# Patient Record
Sex: Female | Born: 1945
Health system: Southern US, Community
[De-identification: ages and names within clinical notes are randomized; demographics above are authoritative.]

## PROBLEM LIST (undated history)

## (undated) DIAGNOSIS — K222 Esophageal obstruction: Secondary | ICD-10-CM

## (undated) DIAGNOSIS — M8580 Other specified disorders of bone density and structure, unspecified site: Secondary | ICD-10-CM

## (undated) DIAGNOSIS — H15001 Unspecified scleritis, right eye: Secondary | ICD-10-CM

## (undated) DIAGNOSIS — H269 Unspecified cataract: Secondary | ICD-10-CM

## (undated) DIAGNOSIS — F419 Anxiety disorder, unspecified: Secondary | ICD-10-CM

## (undated) DIAGNOSIS — I10 Essential (primary) hypertension: Secondary | ICD-10-CM

## (undated) DIAGNOSIS — S065X9A Traumatic subdural hemorrhage with loss of consciousness of unspecified duration, initial encounter: Secondary | ICD-10-CM

## (undated) DIAGNOSIS — R001 Bradycardia, unspecified: Secondary | ICD-10-CM

## (undated) DIAGNOSIS — I493 Ventricular premature depolarization: Secondary | ICD-10-CM

## (undated) DIAGNOSIS — D689 Coagulation defect, unspecified: Secondary | ICD-10-CM

## (undated) DIAGNOSIS — F32A Depression, unspecified: Secondary | ICD-10-CM

## (undated) DIAGNOSIS — K219 Gastro-esophageal reflux disease without esophagitis: Secondary | ICD-10-CM

## (undated) DIAGNOSIS — S065XAA Traumatic subdural hemorrhage with loss of consciousness status unknown, initial encounter: Secondary | ICD-10-CM

## (undated) DIAGNOSIS — Z8489 Family history of other specified conditions: Secondary | ICD-10-CM

## (undated) DIAGNOSIS — D649 Anemia, unspecified: Secondary | ICD-10-CM

## (undated) DIAGNOSIS — E079 Disorder of thyroid, unspecified: Secondary | ICD-10-CM

## (undated) DIAGNOSIS — G4730 Sleep apnea, unspecified: Secondary | ICD-10-CM

## (undated) DIAGNOSIS — F329 Major depressive disorder, single episode, unspecified: Secondary | ICD-10-CM

## (undated) DIAGNOSIS — T8859XA Other complications of anesthesia, initial encounter: Secondary | ICD-10-CM

## (undated) DIAGNOSIS — Z9889 Other specified postprocedural states: Secondary | ICD-10-CM

## (undated) DIAGNOSIS — R112 Nausea with vomiting, unspecified: Secondary | ICD-10-CM

## (undated) DIAGNOSIS — R011 Cardiac murmur, unspecified: Secondary | ICD-10-CM

## (undated) DIAGNOSIS — C4490 Unspecified malignant neoplasm of skin, unspecified: Secondary | ICD-10-CM

## (undated) DIAGNOSIS — M1990 Unspecified osteoarthritis, unspecified site: Secondary | ICD-10-CM

## (undated) DIAGNOSIS — H43813 Vitreous degeneration, bilateral: Secondary | ICD-10-CM

## (undated) DIAGNOSIS — E785 Hyperlipidemia, unspecified: Secondary | ICD-10-CM

## (undated) DIAGNOSIS — D126 Benign neoplasm of colon, unspecified: Secondary | ICD-10-CM

## (undated) DIAGNOSIS — M810 Age-related osteoporosis without current pathological fracture: Secondary | ICD-10-CM

## (undated) HISTORY — DX: Anemia, unspecified: D64.9

## (undated) HISTORY — DX: Bradycardia, unspecified: R00.1

## (undated) HISTORY — PX: ESOPHAGOGASTRODUODENOSCOPY: SHX1529

## (undated) HISTORY — DX: Gastro-esophageal reflux disease without esophagitis: K21.9

## (undated) HISTORY — DX: Unspecified cataract: H26.9

## (undated) HISTORY — DX: Hyperlipidemia, unspecified: E78.5

## (undated) HISTORY — DX: Esophageal obstruction: K22.2

## (undated) HISTORY — PX: HERNIA REPAIR: SHX51

## (undated) HISTORY — PX: EYE SURGERY: SHX253

## (undated) HISTORY — DX: Essential (primary) hypertension: I10

## (undated) HISTORY — DX: Age-related osteoporosis without current pathological fracture: M81.0

## (undated) HISTORY — PX: JOINT REPLACEMENT: SHX530

## (undated) HISTORY — DX: Coagulation defect, unspecified: D68.9

## (undated) HISTORY — DX: Traumatic subdural hemorrhage with loss of consciousness status unknown, initial encounter: S06.5XAA

## (undated) HISTORY — PX: POLYPECTOMY: SHX149

## (undated) HISTORY — DX: Unspecified malignant neoplasm of skin, unspecified: C44.90

## (undated) HISTORY — DX: Traumatic subdural hemorrhage with loss of consciousness of unspecified duration, initial encounter: S06.5X9A

## (undated) HISTORY — PX: DILATION AND CURETTAGE OF UTERUS: SHX78

## (undated) HISTORY — PX: ABDOMINAL HYSTERECTOMY: SHX81

## (undated) HISTORY — DX: Other specified disorders of bone density and structure, unspecified site: M85.80

## (undated) HISTORY — DX: Disorder of thyroid, unspecified: E07.9

## (undated) HISTORY — DX: Major depressive disorder, single episode, unspecified: F32.9

## (undated) HISTORY — DX: Benign neoplasm of colon, unspecified: D12.6

## (undated) HISTORY — PX: BREAST SURGERY: SHX581

## (undated) HISTORY — DX: Ventricular premature depolarization: I49.3

## (undated) HISTORY — DX: Sleep apnea, unspecified: G47.30

## (undated) HISTORY — PX: UPPER GASTROINTESTINAL ENDOSCOPY: SHX188

## (undated) HISTORY — PX: APPENDECTOMY: SHX54

## (undated) HISTORY — DX: Depression, unspecified: F32.A

## (undated) HISTORY — DX: Unspecified osteoarthritis, unspecified site: M19.90

## (undated) HISTORY — DX: Cardiac murmur, unspecified: R01.1

---

## 1977-12-03 HISTORY — PX: THYROIDECTOMY: SHX17

## 1990-12-03 HISTORY — PX: HAND SURGERY: SHX662

## 1993-12-03 HISTORY — PX: OOPHORECTOMY: SHX86

## 1994-02-12 HISTORY — PX: INGUINAL HERNIA REPAIR: SUR1180

## 1998-05-30 ENCOUNTER — Inpatient Hospital Stay (HOSPITAL_COMMUNITY): Admission: RE | Admit: 1998-05-30 | Discharge: 1998-06-02 | Payer: Self-pay | Admitting: Obstetrics and Gynecology

## 1999-01-13 ENCOUNTER — Other Ambulatory Visit: Admission: RE | Admit: 1999-01-13 | Discharge: 1999-01-13 | Payer: Self-pay | Admitting: Obstetrics and Gynecology

## 2000-01-12 ENCOUNTER — Encounter: Payer: Self-pay | Admitting: Gastroenterology

## 2000-01-12 ENCOUNTER — Encounter (INDEPENDENT_AMBULATORY_CARE_PROVIDER_SITE_OTHER): Payer: Self-pay | Admitting: Specialist

## 2000-01-12 ENCOUNTER — Ambulatory Visit (HOSPITAL_COMMUNITY): Admission: RE | Admit: 2000-01-12 | Discharge: 2000-01-12 | Payer: Self-pay | Admitting: Gastroenterology

## 2002-05-13 ENCOUNTER — Ambulatory Visit (HOSPITAL_COMMUNITY): Admission: RE | Admit: 2002-05-13 | Discharge: 2002-05-13 | Payer: Self-pay | Admitting: Neurology

## 2002-05-13 ENCOUNTER — Encounter: Payer: Self-pay | Admitting: Neurology

## 2002-07-21 ENCOUNTER — Encounter: Payer: Self-pay | Admitting: Gastroenterology

## 2003-05-27 ENCOUNTER — Ambulatory Visit (HOSPITAL_BASED_OUTPATIENT_CLINIC_OR_DEPARTMENT_OTHER): Admission: RE | Admit: 2003-05-27 | Discharge: 2003-05-27 | Payer: Self-pay | Admitting: Internal Medicine

## 2003-09-15 ENCOUNTER — Other Ambulatory Visit: Admission: RE | Admit: 2003-09-15 | Discharge: 2003-09-15 | Payer: Self-pay | Admitting: Obstetrics and Gynecology

## 2004-09-21 ENCOUNTER — Other Ambulatory Visit: Admission: RE | Admit: 2004-09-21 | Discharge: 2004-09-21 | Payer: Self-pay | Admitting: Obstetrics and Gynecology

## 2004-12-03 DIAGNOSIS — Z950 Presence of cardiac pacemaker: Secondary | ICD-10-CM

## 2004-12-03 HISTORY — PX: PACEMAKER INSERTION: SHX728

## 2004-12-03 HISTORY — DX: Presence of cardiac pacemaker: Z95.0

## 2005-05-22 ENCOUNTER — Ambulatory Visit (HOSPITAL_COMMUNITY): Admission: RE | Admit: 2005-05-22 | Discharge: 2005-05-23 | Payer: Self-pay | Admitting: *Deleted

## 2005-09-26 ENCOUNTER — Other Ambulatory Visit: Admission: RE | Admit: 2005-09-26 | Discharge: 2005-09-26 | Payer: Self-pay | Admitting: Obstetrics and Gynecology

## 2006-09-23 ENCOUNTER — Ambulatory Visit: Payer: Self-pay | Admitting: Gastroenterology

## 2006-10-17 ENCOUNTER — Other Ambulatory Visit: Admission: RE | Admit: 2006-10-17 | Discharge: 2006-10-17 | Payer: Self-pay | Admitting: Obstetrics and Gynecology

## 2006-10-18 ENCOUNTER — Encounter: Admission: RE | Admit: 2006-10-18 | Discharge: 2006-10-18 | Payer: Self-pay | Admitting: Cardiology

## 2006-11-07 ENCOUNTER — Ambulatory Visit: Payer: Self-pay | Admitting: Gastroenterology

## 2006-12-03 DIAGNOSIS — D126 Benign neoplasm of colon, unspecified: Secondary | ICD-10-CM

## 2006-12-03 HISTORY — DX: Benign neoplasm of colon, unspecified: D12.6

## 2006-12-24 ENCOUNTER — Ambulatory Visit: Payer: Self-pay | Admitting: Gastroenterology

## 2006-12-24 ENCOUNTER — Encounter (INDEPENDENT_AMBULATORY_CARE_PROVIDER_SITE_OTHER): Payer: Self-pay | Admitting: *Deleted

## 2006-12-26 ENCOUNTER — Emergency Department (HOSPITAL_COMMUNITY): Admission: EM | Admit: 2006-12-26 | Discharge: 2006-12-26 | Payer: Self-pay | Admitting: Emergency Medicine

## 2006-12-30 ENCOUNTER — Ambulatory Visit: Payer: Self-pay | Admitting: Internal Medicine

## 2007-05-28 ENCOUNTER — Ambulatory Visit: Payer: Self-pay | Admitting: Gastroenterology

## 2007-05-28 LAB — CONVERTED CEMR LAB
AST: 23 units/L (ref 0–37)
Alkaline Phosphatase: 41 units/L (ref 39–117)
BUN: 19 mg/dL (ref 6–23)
CO2: 33 meq/L — ABNORMAL HIGH (ref 19–32)
Chloride: 98 meq/L (ref 96–112)
Eosinophils Absolute: 0.1 10*3/uL (ref 0.0–0.6)
GFR calc Af Amer: 82 mL/min
GFR calc non Af Amer: 68 mL/min
Glucose, Bld: 84 mg/dL (ref 70–99)
HCT: 39.3 % (ref 36.0–46.0)
Lipase: 31 units/L (ref 11.0–59.0)
MCHC: 34.5 g/dL (ref 30.0–36.0)
MCV: 95.4 fL (ref 78.0–100.0)
Monocytes Relative: 8.2 % (ref 3.0–11.0)
Neutro Abs: 2.6 10*3/uL (ref 1.4–7.7)
Platelets: 182 10*3/uL (ref 150–400)
Sodium: 143 meq/L (ref 135–145)

## 2007-06-02 ENCOUNTER — Ambulatory Visit: Payer: Self-pay | Admitting: Cardiology

## 2007-06-09 ENCOUNTER — Ambulatory Visit: Payer: Self-pay | Admitting: Internal Medicine

## 2007-06-16 ENCOUNTER — Ambulatory Visit: Payer: Self-pay | Admitting: Gastroenterology

## 2007-06-25 ENCOUNTER — Ambulatory Visit: Payer: Self-pay | Admitting: Gastroenterology

## 2007-08-25 ENCOUNTER — Encounter: Admission: RE | Admit: 2007-08-25 | Discharge: 2007-08-25 | Payer: Self-pay | Admitting: Orthopedic Surgery

## 2007-10-20 ENCOUNTER — Other Ambulatory Visit: Admission: RE | Admit: 2007-10-20 | Discharge: 2007-10-20 | Payer: Self-pay | Admitting: Obstetrics and Gynecology

## 2008-03-30 DIAGNOSIS — K649 Unspecified hemorrhoids: Secondary | ICD-10-CM | POA: Insufficient documentation

## 2008-03-30 DIAGNOSIS — E7800 Pure hypercholesterolemia, unspecified: Secondary | ICD-10-CM | POA: Insufficient documentation

## 2008-03-30 DIAGNOSIS — Z85828 Personal history of other malignant neoplasm of skin: Secondary | ICD-10-CM | POA: Insufficient documentation

## 2008-03-30 DIAGNOSIS — G4733 Obstructive sleep apnea (adult) (pediatric): Secondary | ICD-10-CM | POA: Insufficient documentation

## 2008-03-30 DIAGNOSIS — I10 Essential (primary) hypertension: Secondary | ICD-10-CM | POA: Insufficient documentation

## 2008-03-30 DIAGNOSIS — K4090 Unilateral inguinal hernia, without obstruction or gangrene, not specified as recurrent: Secondary | ICD-10-CM | POA: Insufficient documentation

## 2008-08-05 ENCOUNTER — Ambulatory Visit: Payer: Self-pay | Admitting: Gynecology

## 2008-10-20 ENCOUNTER — Other Ambulatory Visit: Admission: RE | Admit: 2008-10-20 | Discharge: 2008-10-20 | Payer: Self-pay | Admitting: Obstetrics and Gynecology

## 2008-10-20 ENCOUNTER — Ambulatory Visit: Payer: Self-pay | Admitting: Obstetrics and Gynecology

## 2008-10-20 ENCOUNTER — Encounter: Payer: Self-pay | Admitting: Obstetrics and Gynecology

## 2008-12-03 HISTORY — PX: OTHER SURGICAL HISTORY: SHX169

## 2009-04-29 ENCOUNTER — Encounter: Admission: RE | Admit: 2009-04-29 | Discharge: 2009-04-29 | Payer: Self-pay | Admitting: Cardiology

## 2009-06-24 DIAGNOSIS — Z860100 Personal history of colon polyps, unspecified: Secondary | ICD-10-CM | POA: Insufficient documentation

## 2009-06-28 ENCOUNTER — Ambulatory Visit: Payer: Self-pay | Admitting: Gastroenterology

## 2009-06-28 DIAGNOSIS — R079 Chest pain, unspecified: Secondary | ICD-10-CM

## 2009-06-28 DIAGNOSIS — K219 Gastro-esophageal reflux disease without esophagitis: Secondary | ICD-10-CM

## 2009-10-21 ENCOUNTER — Ambulatory Visit: Payer: Self-pay | Admitting: Obstetrics and Gynecology

## 2009-10-21 ENCOUNTER — Other Ambulatory Visit: Admission: RE | Admit: 2009-10-21 | Discharge: 2009-10-21 | Payer: Self-pay | Admitting: Obstetrics and Gynecology

## 2009-11-30 ENCOUNTER — Encounter: Admission: RE | Admit: 2009-11-30 | Discharge: 2009-11-30 | Payer: Self-pay | Admitting: Obstetrics and Gynecology

## 2009-12-03 HISTORY — PX: KNEE ARTHROSCOPY: SHX127

## 2009-12-19 ENCOUNTER — Encounter: Admission: RE | Admit: 2009-12-19 | Discharge: 2009-12-19 | Payer: Self-pay | Admitting: Orthopedic Surgery

## 2010-03-19 ENCOUNTER — Encounter: Payer: Self-pay | Admitting: Internal Medicine

## 2010-03-31 ENCOUNTER — Ambulatory Visit: Payer: Self-pay | Admitting: Internal Medicine

## 2010-04-05 ENCOUNTER — Telehealth: Payer: Self-pay | Admitting: Internal Medicine

## 2010-06-07 ENCOUNTER — Ambulatory Visit: Payer: Self-pay | Admitting: Obstetrics and Gynecology

## 2010-06-12 ENCOUNTER — Encounter: Admission: RE | Admit: 2010-06-12 | Discharge: 2010-06-12 | Payer: Self-pay | Admitting: Obstetrics and Gynecology

## 2010-07-04 ENCOUNTER — Telehealth (INDEPENDENT_AMBULATORY_CARE_PROVIDER_SITE_OTHER): Payer: Self-pay | Admitting: *Deleted

## 2010-07-29 ENCOUNTER — Ambulatory Visit: Payer: Self-pay | Admitting: Cardiology

## 2010-08-03 ENCOUNTER — Ambulatory Visit: Payer: Self-pay | Admitting: Cardiology

## 2010-08-08 ENCOUNTER — Ambulatory Visit: Payer: Self-pay | Admitting: Cardiology

## 2010-08-10 ENCOUNTER — Ambulatory Visit: Payer: Self-pay | Admitting: Cardiology

## 2010-08-10 ENCOUNTER — Encounter: Payer: Self-pay | Admitting: Internal Medicine

## 2010-08-18 ENCOUNTER — Ambulatory Visit: Payer: Self-pay | Admitting: Obstetrics and Gynecology

## 2010-09-28 ENCOUNTER — Telehealth: Payer: Self-pay | Admitting: Internal Medicine

## 2010-10-24 ENCOUNTER — Other Ambulatory Visit: Admission: RE | Admit: 2010-10-24 | Discharge: 2010-10-24 | Payer: Self-pay | Admitting: Obstetrics and Gynecology

## 2010-10-24 ENCOUNTER — Ambulatory Visit: Payer: Self-pay | Admitting: Obstetrics and Gynecology

## 2010-11-01 ENCOUNTER — Ambulatory Visit: Payer: Self-pay | Admitting: Internal Medicine

## 2010-11-01 DIAGNOSIS — I498 Other specified cardiac arrhythmias: Secondary | ICD-10-CM

## 2010-12-06 ENCOUNTER — Encounter
Admission: RE | Admit: 2010-12-06 | Discharge: 2010-12-06 | Payer: Self-pay | Source: Home / Self Care | Attending: Obstetrics and Gynecology | Admitting: Obstetrics and Gynecology

## 2010-12-14 ENCOUNTER — Ambulatory Visit: Payer: Self-pay | Admitting: Cardiology

## 2010-12-15 ENCOUNTER — Ambulatory Visit: Payer: Self-pay | Admitting: Cardiology

## 2011-01-02 NOTE — Cardiovascular Report (Signed)
Summary: Office Visit   Office Visit   Imported By: Roderic Ovens 11/07/2010 10:41:39  _____________________________________________________________________  External Attachment:    Type:   Image     Comment:   External Document

## 2011-01-02 NOTE — Progress Notes (Signed)
Summary: dr. Jeanine Luz pain    Phone Note Other Incoming   Caller: dr Phillips Odor office Summary of Call: dr. Phillips Odor office calling pt is having chest pain would like to talk a dr Tenny Craw or nurse re appt for tomorrow or monday.  dr Phillips Odor # [phone]  Initial call taken by: Roe Coombs,  September 28, 2010 2:36 PM  Follow-up for Phone Call        Patient will be seen on Monday. Mildred booked appointment. Layne Benton, RN, BSN  September 28, 2010 3:53 PM      Appended Document: dr. Jeanine Luz pain  This message was taken on the incorrect patient. Mrs. Hurd is not on the schedule for Monday. A different patient was referred by Dr. Phillips Odor for chest pain.

## 2011-01-02 NOTE — Assessment & Plan Note (Signed)
Summary: rov/ sleep ///kp   CC:  Sleep apnea concerns..  History of Present Illness: PROBLEM:  Obstructive sleep apnea.  06/09/07- HISTORY:  This 65 year old woman is followed by Dr.  Patty Sermons for primary care and was previously worked up by me for obstructive sleep apnea at the old office. She had had a standard sleep study on May 27, 2003, recording an apnea hypopnea index of 18 per hour, desaturating to 82% with mild snoring and normal cardiac rhythm at that time, some bradycardia. She was subsequently titrated on CPAP, but could never tolerate any of the masks tried and she eventually gave up and quit CPAP. She looked into oral appliances, but had no confidence in the local dental medicine people here. She called to ask what new therapies might be available so our staff brought her in. Her husband says that if she will sleep on her side, snoring is prevented and she really has obvious apnea that only involves sleeping on her back. He is a deep sleeper and does not pay a lot of attention. Her arthritis has gotten to a point where she is not comfortable sustaining lateral sleep positions throughout the night. She does admit to some daytime sleepiness. Bedtime is between 11 and midnight. Estimated short sleep latency. She will sleep about four hours and then begin waking every hour or so until finally up around 7:30 a.m. She has lost a few pounds in recent years.   March 31, 2010- OSA ......................Marland Kitchenhusband here She want update on therapy options, having been unable to tolerate cpap in the past.  Early TMJ discomfort right jaw. Can't breathe through nose with mouth closed.  Husband sleeps in separate room. Daytime fatigue is occasionally a problem.    Current Medications (verified): 1)  Axid 150 Mg Caps (Nizatidine) .... Take 1 Capsule By Mouth Two Times A Day 2)  Ativan 1 Mg Tabs (Lorazepam) .... As Needed 3)  Ranexa 500 Mg Xr12h-Tab (Ranolazine) .... Take 1 Tablet By  Mouth Once A Day 4)  Axid 300 Mg Caps (Nizatidine) .... One Capsule By Mouth Two Times A Day 5)  Hydrochlorothiazide 25 Mg Tabs (Hydrochlorothiazide) .... Take 1 By Mouth Once Daily  Allergies (verified): 1)  ! Epinephrine 2)  ! Paxil 3)  ! Pcn 4)  ! * Toprol 5)  ! Verapamil 6)  * Anithistamines 7)  * Bystolic 8)  Cozaar 9)  Demerol 10)  Erythromycin 11)  * Eubaid 12)  Inderal 13)  Keflex 14)  Lidocaine 15)  Morphine 16)  Tetracycline 17)  * Ziac 18)  Zoloft  Past History:  Past Medical History: Last updated: 06/28/2009 COLONIC POLYPS, ADENOMATOUS, HX OF (ICD-V12.72) 12/2006 HEMORRHOIDS (ICD-455.6) INGUINAL HERNIA (ICD-550.90) SKIN CANCER, HX OF (ICD-V10.83) HYPERLIPIDEMIA (ICD-272.4) HYPERTENSION (ICD-401.9) SLEEP APNEA (ICD-780.57) Allergic rhinits  GERD Bradycardia  Past Surgical History: Last updated: 06/28/2009 inguinal hernia repair thyroidectomy polypectomy hysterectomy pacemaker 2006 ? appendectomy w/hysterectomy  Family History: Last updated: 06/24/2009 Family History of Colon Polyps:Aunt No FH of Colon Cancer: Family History of Breast Cancer: Aunt Family History of Ovarian Cancer:Aunt Family History of Diabetes: Grandparents, Aunt, Uncle  Social History: Last updated: 06/28/2009 Married, 1 son Patient has never smoked.  Alcohol Use - no Illicit Drug Use - no Patient does not get regular exercise.   Risk Factors: Exercise: no (06/28/2009)  Risk Factors: Smoking Status: never (06/24/2009)  Review of Systems      See HPI  The patient denies anorexia, fever, weight loss, weight gain, vision loss, decreased  hearing, hoarseness, chest pain, syncope, dyspnea on exertion, peripheral edema, prolonged cough, headaches, hemoptysis, and severe indigestion/heartburn.    Vital Signs:  Patient profile:   65 year old female Height:      64.5 inches Weight:      125.38 pounds BMI:     21.27 O2 Sat:      95 % on Room air Pulse rate:   64 /  minute BP sitting:   112 / 62  (left arm) Cuff size:   regular  Vitals Entered By: Reynaldo Minium CMA (March 31, 2010 2:52 PM)  O2 Flow:  Room air  Physical Exam  Additional Exam:  General: A/Ox3; pleasant and cooperative, NAD, slender, alert SKIN: no rash, lesions NODES: no lymphadenopathy HEENT: Latimer/AT, EOM- WNL, Conjuctivae- clear, PERRLA, TM-WNL, Nose- clear, narrow on right, Throat- clear and wnl, Mallampati  III NECK: Supple w/ fair ROM, JVD- none, normal carotid impulses w/o bruits Thyroid- normal to palpation CHEST: Clear to P&A HEART: RRR, no m/g/r heard ABDOMEN: Soft and nl; HWE:XHBZ, nl pulses, no edema  NEURO: Grossly intact to observation      Impression & Recommendations:  Problem # 1:  SLEEP APNEA (ICD-780.57)  We can make some alternative options available for her to explore. We will also steer her toward a provider of oral appliances.  Medications Added to Medication List This Visit: 1)  Hydrochlorothiazide 25 Mg Tabs (Hydrochlorothiazide) .... Take 1 by mouth once daily  Other Orders: Est. Patient Level III (16967) Sleep Disorder Referral (Sleep Disorder)  Patient Instructions: 1)  Please schedule a follow-up appointment as needed. 2)  Consider an otc chin strap for snoring 3)  consider trying Breathe Right Nasal Strips, either alone or with a chin strap 4)  Call Dr Leonie Man, MD, DDS for information about oral appliances for sleep apnea. I can forward record on request as needed. 5)  Speak to [facility] about referral to the sleep center staff for "cpap desensitization"

## 2011-01-02 NOTE — Assessment & Plan Note (Signed)
Summary: pacer check/medtronic  Medications Added NITROSTAT 0.4 MG SUBL (NITROGLYCERIN) 1 tablet under tongue at onset of chest pain; you may repeat every 5 minutes for up to 3 doses.        Visit Type:  Initial Consult Referring Provider:  Dr Patty Sermons   History of Present Illness: Ms Opdahl is a pleasant 65 yo WF with a h/o symptomatic bradycardia s/p PPM (MDT) by Dr Reyes Ivan 2006 who now presents to establish EP care.  She reports doing very well recently without symptomatic bradycardia.  She has stable atypical chest pain and L neck fullness which have previously been evaluated by normal cardiolyte by Dr Patty Sermons several years ago.  She denies SOB, presyncope, syncope, or other concerns today.  Current Medications (verified): 1)  Axid 150 Mg Caps (Nizatidine) .... Take 1 Capsule By Mouth Two Times A Day 2)  Ativan 1 Mg Tabs (Lorazepam) .... As Needed 3)  Hydrochlorothiazide 25 Mg Tabs (Hydrochlorothiazide) .... Take 1 By Mouth Once Daily 4)  Nitrostat 0.4 Mg Subl (Nitroglycerin) .Marland Kitchen.. 1 Tablet Under Tongue At Onset of Chest Pain; You May Repeat Every 5 Minutes For Up To 3 Doses.  Allergies: 1)  ! Epinephrine 2)  ! Paxil 3)  ! Pcn 4)  ! * Toprol 5)  ! Verapamil 6)  ! Inderal 7)  * Anithistamines 8)  * Bystolic 9)  Cozaar 10)  Demerol 11)  Erythromycin 12)  * Eubaid 13)  Inderal 14)  Keflex 15)  Lidocaine 16)  Morphine 17)  Tetracycline 18)  * Ziac 19)  Zoloft  Past History:  Past Medical History: COLONIC POLYPS, ADENOMATOUS, HX OF (ICD-V12.72) 12/2006 HEMORRHOIDS (ICD-455.6) INGUINAL HERNIA (ICD-550.90) SKIN CANCER, HX OF (ICD-V10.83) HYPERLIPIDEMIA (ICD-272.4) HYPERTENSION (ICD-401.9) SLEEP APNEA (ICD-780.57) Allergic rhinits  GERD Bradycardia s/p MDT PPM atypical chest pain  Past Surgical History: inguinal hernia repair thyroidectomy polypectomy hysterectomy pacemaker 2006 by Dr Reyes Ivan ? appendectomy w/hysterectomy  Family History: Reviewed history  from 06/24/2009 and no changes required. Family History of Colon Polyps:Aunt No FH of Colon Cancer: Family History of Breast Cancer: Aunt Family History of Ovarian Cancer:Aunt Family History of Diabetes: Grandparents, Celine Ahr, Uncle  Social History: Reviewed history from 06/28/2009 and no changes required. Married, 1 son Patient has never smoked.  Alcohol Use - no Illicit Drug Use - no Patient does not get regular exercise.   Review of Systems       All systems are reviewed and negative except as listed in the HPI.   Vital Signs:  Patient profile:   65 year old female Height:      64.5 inches Weight:      122 pounds BMI:     20.69 Pulse rate:   69 / minute BP sitting:   130 / 84  (left arm)  Vitals Entered By: Laurance Flatten CMA (November 01, 2010 2:27 PM)  Physical Exam  General:  Well developed, well nourished, in no acute distress. Head:  normocephalic and atraumatic Eyes:  PERRLA/EOM intact; conjunctiva and lids normal. Mouth:  Teeth, gums and palate normal. Oral mucosa normal. Neck:  supple, L EJ is prominent Chest Wall:  pacemaker pocket is well healed Lungs:  Clear bilaterally to auscultation and percussion. Heart:  Non-displaced PMI, chest non-tender; regular rate and rhythm, S1, S2 without murmurs, rubs or gallops. Carotid upstroke normal, no bruit. Normal abdominal aortic size, no bruits. Femorals normal pulses, no bruits. Pedals normal pulses. No edema, no varicosities. Abdomen:  Bowel sounds positive; abdomen soft and non-tender without  masses, organomegaly, or hernias noted. No hepatosplenomegaly. Msk:  Back normal, normal gait. Muscle strength and tone normal. Pulses:  pulses normal in all 4 extremities Extremities:  No clubbing or cyanosis. Neurologic:  Alert and oriented x 3.   PPM Specifications Following MD:  Hillis Range, MD     Referring MD:  Dimas Chyle Vendor:  Medtronic     PPM Model Number:  [id number]     PPM Serial Number:  [id number] PPM DOI:   05/22/2005     PPM Implanting MD:  [facility]  Lead 1    Location: RA     DOI: 05/22/2005     Model #: [id number]     Serial #: [id number]     Status: active Lead 2    Location: RV     DOI: 05/22/2005     Model #: 2952     Serial #: [id number]     Status: active  PPM Follow Up Battery Voltage:  2.75 V     Battery Est. Longevity:  5 YRS       PPM Device Measurements Atrium  Amplitude: 5.60 mV, Impedance: 548 ohms, Threshold: 0.750 V at 0.40 msec Right Ventricle  Amplitude: 31.36 mV, Impedance: 599 ohms, Threshold: 0.50 V at 0.40 msec  Episodes MS Episodes:  0     Ventricular High Rate:  1     Atrial Pacing:  53.4%     Ventricular Pacing:  0.3%  Parameters Mode:  DDDR     Lower Rate Limit:  55     Upper Rate Limit:  130 Paced AV Delay:  150     Sensed AV Delay:  120 Next Remote Date:  02/01/2011     Next Cardiology Appt Due:  10/04/2011 Tech Comments:  1 VHR EPISODE LASTING 4 SECONDS.  NORMAL DEVICE FUNCTION.  CHANGED RA OUTPUT FROM 1.5 TO 2.00 AND RV OUTPUT FROM 1.750 TO 2.50 V.  CARELINK TRANSMISSION 02-01-11 AND ROV IN 12 MTHS W/JA. Vella Kohler  November 01, 2010 3:07 PM MD Comments:  agree  Impression & Recommendations:  Problem # 1:  BRADYCARDIA (ICD-427.89)  normal pacemaker function no changes  The following medications were removed from the medication list:    Ranexa 500 Mg Xr12h-tab (Ranolazine) .Marland Kitchen... Take 1 tablet by mouth once a day Her updated medication list for this problem includes:    Nitrostat 0.4 Mg Subl (Nitroglycerin) .Marland Kitchen... 1 tablet under tongue at onset of chest pain; you may repeat every 5 minutes for up to 3 doses.  Problem # 2:  CHEST PAIN (ICD-786.50)  stable no changes today  The following medications were removed from the medication list:    Ranexa 500 Mg Xr12h-tab (Ranolazine) .Marland Kitchen... Take 1 tablet by mouth once a day Her updated medication list for this problem includes:    Nitrostat 0.4 Mg Subl (Nitroglycerin) .Marland Kitchen... 1 tablet under tongue at onset of chest  pain; you may repeat every 5 minutes for up to 3 doses.  Problem # 3:  HYPERTENSION (ICD-401.9)  stable  Her updated medication list for this problem includes:    Hydrochlorothiazide 25 Mg Tabs (Hydrochlorothiazide) .Marland Kitchen... Take 1 by mouth once daily  Patient Instructions: 1)  Your physician wants you to follow-up in: 12 months with Dr Jacquiline Doe will receive a reminder letter in the mail two months in advance. If you don't receive a letter, please call our office to schedule the follow-up appointment. 2)  [facility] transmission on 02/01/2011

## 2011-01-02 NOTE — Progress Notes (Signed)
Summary: CPAP desensitization failed at Sleep Center  Phone Note Other Incoming   Summary of Call: She was evaluated by staff at the sleep center. They report inability to find a CPAP mask style she could tolerate. Their note is scanned. She is going to look into dental appliances. Initial call taken by: Waymon Budge MD,  Apr 05, 2010 10:31 PM

## 2011-01-02 NOTE — Progress Notes (Signed)
Summary: copy sleep study  Phone Note From Other Clinic Call back at [phone]   Caller: Dr. Leonie Man Office Call For: [place] Summary of Call: need copy of sleep study - for oral appliance  Fax [phone] Initial call taken by: Eugene Gavia,  July 04, 2010 3:33 PM  Follow-up for Phone Call        Faxed sleep study.//Juanita Follow-up by: Darletta Moll,  July 05, 2010 10:38 AM

## 2011-01-04 NOTE — Letter (Signed)
Summary: [facility] Progress Note   [facility] Progress Note   Imported By: Roderic Ovens 11/21/2010 16:17:37  _____________________________________________________________________  External Attachment:    Type:   Image     Comment:   External Document

## 2011-01-04 NOTE — Cardiovascular Report (Signed)
Summary: Pacemaker Info  Pacemaker Info   Imported By: Marylou Mccoy 11/23/2010 11:20:53  _____________________________________________________________________  External Attachment:    Type:   Image     Comment:   External Document

## 2011-02-01 ENCOUNTER — Encounter (INDEPENDENT_AMBULATORY_CARE_PROVIDER_SITE_OTHER): Payer: 59

## 2011-02-01 DIAGNOSIS — I495 Sick sinus syndrome: Secondary | ICD-10-CM

## 2011-02-02 ENCOUNTER — Encounter: Payer: Self-pay | Admitting: Internal Medicine

## 2011-02-19 ENCOUNTER — Encounter: Payer: Self-pay | Admitting: *Deleted

## 2011-02-21 ENCOUNTER — Telehealth: Payer: Self-pay | Admitting: Gastroenterology

## 2011-02-21 NOTE — Telephone Encounter (Signed)
Patient calling to report that for the last several months, she has had pain in the rectal area up inside the rectum. Patient has been treated recently for  Lower back pain- degenerative disc disease and she thought this was causing the problem but now she is not sure it is the problem. The pain is random, not associated with bowel movements. It comes and goes. She does have constipation at times and uses Metamucil prn with relief. She does have some bright, red blood with stools occasionally due to a small hemorrhoid.Last colonoscopy 2008. Please, advise.

## 2011-02-21 NOTE — Telephone Encounter (Signed)
I'm not sure about the cause of her intermittent rectal pain. Last colonoscopy report reviewed. It could be related to her back problems, hemorrhoids, constipation or other causes. Continue fiber supplements and an over-the-counter hemorrhoidal suppository on a daily basis for several days. Schedule an office visit with me or extender to further evaluate.

## 2011-02-22 NOTE — Telephone Encounter (Signed)
Patient given Dr. Ardell Isaacs recommendations. Scheduled patient to see Willette Cluster, NP on 02/28/11 at 3:30 PM.

## 2011-02-28 ENCOUNTER — Encounter: Payer: Self-pay | Admitting: Nurse Practitioner

## 2011-02-28 ENCOUNTER — Ambulatory Visit (INDEPENDENT_AMBULATORY_CARE_PROVIDER_SITE_OTHER): Payer: 59 | Admitting: Nurse Practitioner

## 2011-02-28 ENCOUNTER — Ambulatory Visit: Payer: 59 | Admitting: Nurse Practitioner

## 2011-02-28 DIAGNOSIS — Z86010 Personal history of colonic polyps: Secondary | ICD-10-CM | POA: Insufficient documentation

## 2011-02-28 DIAGNOSIS — K5900 Constipation, unspecified: Secondary | ICD-10-CM | POA: Insufficient documentation

## 2011-02-28 DIAGNOSIS — K648 Other hemorrhoids: Secondary | ICD-10-CM

## 2011-02-28 DIAGNOSIS — K6289 Other specified diseases of anus and rectum: Secondary | ICD-10-CM | POA: Insufficient documentation

## 2011-02-28 MED ORDER — HYDROCORTISONE ACETATE 25 MG RE SUPP: 25.0000 mg | Freq: Every day | RECTAL | Status: AC

## 2011-02-28 MED ORDER — HYOSCYAMINE SULFATE 0.125 MG SL SUBL: 0.1250 mg | SUBLINGUAL_TABLET | Freq: Three times a day (TID) | SUBLINGUAL | Status: AC

## 2011-02-28 NOTE — Patient Instructions (Signed)
We have sent prescriptions for Suppositories and a Sublingual Hyoscyamine tablet to [facility] on [address]. Please call us early next week with a progress report.  You can ask for Inis Sizer ACNP's Medical assistant.  Call [phone].

## 2011-02-28 NOTE — Progress Notes (Signed)
[name] is a 65 year old female known to Dr. Russella Dar for history of GERD and colon polyps. She was last seen July 2010. She is scheduled for surveillance colonoscopy in 2013. Patient gives a several month history of intermittent rectal pain. She has been under the care of a physiatrist for a a two year history of lower back problems. She is apparently responding to physical therapy. It sounds like a steroid injection is the next step if she doesn't continue to improve. Patient describes discomfort inside her rectum as an achy intermittent pain not related to bowel movements. Episodes lasts 1-2 hours.She has an occasional severe shooting pain. She does admit to having difficulty with distinguishing between lower back pain and rectal pain.  What she percieves as rectal pain has been progressive over the last 2 weeks. No loss of bowel or bladder function. Rarely has blood in stool when extremely constipated. She does have chronic constipation but manages with Metamucil.   Current Medications, Allergies, Past Medical History, Past Surgical History, Family History and Social History were reviewed in [employer] record.   ROS: See History of Present Illness for pertinent negatives and pertinent positives.   Physical Exam: General: Well develope , well nourished white female in no acute distress Head: Normocephalic and atraumatic Eyes:  sclerae anicteric,conjunctive pink. Ears: Normal auditory acuity Mouth: No deformity or lesions Neck: Supple, no masses.  Lungs: Clear throughout to auscultation Heart: Regular rate and rhythm; no murmurs heard Abdomen: Soft, non tender and non distended. No masses or hepatomegaly noted. Normal Bowel sounds Rectal: A few small internal hemorrhoids on anoscopy Musculoskeletal: Symmetrical with no gross deformities  Skin: No lesions on visible extremities Extremities: No edema or deformities noted Neurological: Alert oriented x 4, grossly  nonfocal Cervical Nodes:  No significant cervical adenopathy Psychological:  Alert and cooperative. Normal mood and affect  Assessment and Plan:  Rectal or anal pain Nothing abnormal on rectal examination to account for her pain. The pain could be related to her lower back problems. Proctalgia fugax is possible given the sharp, shooting pains. It is worth a trial of sublingual Hyoscyamine which is sometimes used to treat that condition. I have asked her to speak with her physiatrist about the rectal pain and whether he or she thinks it is related to her lower back problems. Patient will call me sometime in the next several days with a condition update.  Internal hemorrhoids Trial of steroid suppositories  Constipation She manages constipation with Metamucil  History of colon polyps Up to date on colon cancer screening.

## 2011-03-01 ENCOUNTER — Encounter: Payer: Self-pay | Admitting: Nurse Practitioner

## 2011-03-01 NOTE — Letter (Signed)
Summary: Remote Device Check  [employer], Main Office  [address]   [place], [place] [address]   Phone: [phone]  Fax: [phone]     February 19, 2011 MRN: [id number]   [facility] [address] [place], [place]  [address]   Dear Ms. Teagle,   Your remote transmission was recieved and reviewed by your physician.  All diagnostics were within normal limits for you.  __X___Your next transmission is scheduled for: 05-03-2011.  Please transmit at any time this day.  If you have a wireless device your transmission will be sent automatically.   Sincerely,  Vella Kohler

## 2011-03-01 NOTE — Assessment & Plan Note (Signed)
Trial of steroid suppositories

## 2011-03-01 NOTE — Cardiovascular Report (Signed)
Summary: Office Visit   Office Visit   Imported By: Roderic Ovens 02/20/2011 14:11:28  _____________________________________________________________________  External Attachment:    Type:   Image     Comment:   External Document

## 2011-03-01 NOTE — Progress Notes (Signed)
Reviewed and agree.

## 2011-03-01 NOTE — Assessment & Plan Note (Signed)
Up to date on colon cancer screening

## 2011-03-01 NOTE — Assessment & Plan Note (Signed)
Nothing abnormal on rectal examination to account for her pain. The pain could be related to her lower back problems. Proctalgia fugax is possible given the sharp, shooting pains. It is worth a trial of sublingual Hyoscyamine which is sometimes used to treat that condition. I have asked her to speak with her physiatrist about the rectal pain and whether he or she thinks it is related to her lower back problems. Patient will call me sometime in the next several days with a condition update.

## 2011-03-01 NOTE — Assessment & Plan Note (Signed)
She manages constipation with Metamucil

## 2011-03-07 ENCOUNTER — Telehealth: Payer: Self-pay | Admitting: *Deleted

## 2011-03-07 NOTE — Telephone Encounter (Signed)
Please return patient's husband's call Virl Diamond).  He has a question regarding her pacemaker.  He was requesting Lawson Fiscal return his call.

## 2011-03-07 NOTE — Telephone Encounter (Signed)
Billing question, taking to [facility]

## 2011-04-02 ENCOUNTER — Other Ambulatory Visit: Payer: Self-pay | Admitting: *Deleted

## 2011-04-02 DIAGNOSIS — E7800 Pure hypercholesterolemia, unspecified: Secondary | ICD-10-CM

## 2011-04-03 ENCOUNTER — Other Ambulatory Visit: Payer: Self-pay | Admitting: Cardiology

## 2011-04-03 DIAGNOSIS — F419 Anxiety disorder, unspecified: Secondary | ICD-10-CM

## 2011-04-03 NOTE — Telephone Encounter (Signed)
escribe request  

## 2011-04-16 ENCOUNTER — Other Ambulatory Visit: Payer: Self-pay | Admitting: *Deleted

## 2011-04-16 DIAGNOSIS — I10 Essential (primary) hypertension: Secondary | ICD-10-CM

## 2011-04-16 MED ORDER — HYDROCHLOROTHIAZIDE 25 MG PO TABS: 25.0000 mg | ORAL_TABLET | Freq: Every day | ORAL | Status: DC

## 2011-04-16 NOTE — Telephone Encounter (Signed)
Refilled hctz to [facility]

## 2011-04-17 ENCOUNTER — Other Ambulatory Visit (INDEPENDENT_AMBULATORY_CARE_PROVIDER_SITE_OTHER): Payer: 59 | Admitting: Cardiology

## 2011-04-17 ENCOUNTER — Other Ambulatory Visit (INDEPENDENT_AMBULATORY_CARE_PROVIDER_SITE_OTHER): Payer: 59 | Admitting: *Deleted

## 2011-04-17 DIAGNOSIS — E7800 Pure hypercholesterolemia, unspecified: Secondary | ICD-10-CM

## 2011-04-17 DIAGNOSIS — Z13220 Encounter for screening for lipoid disorders: Secondary | ICD-10-CM

## 2011-04-17 LAB — HEPATIC FUNCTION PANEL
Albumin: 4.2 g/dL (ref 3.5–5.2)
Total Bilirubin: 0.6 mg/dL (ref 0.3–1.2)
Total Protein: 6.8 g/dL (ref 6.0–8.3)

## 2011-04-17 LAB — BASIC METABOLIC PANEL
Calcium: 9.1 mg/dL (ref 8.4–10.5)
Chloride: 98 mEq/L (ref 96–112)
Creatinine, Ser: 0.7 mg/dL (ref 0.4–1.2)
GFR: 84.14 mL/min (ref >60.00)
Glucose, Bld: 93 mg/dL (ref 70–99)

## 2011-04-17 LAB — LDL CHOLESTEROL, DIRECT: Direct LDL: 138.7 mg/dL

## 2011-04-17 LAB — LIPID PANEL: Total CHOL/HDL Ratio: 3

## 2011-04-17 NOTE — Assessment & Plan Note (Signed)
[facility]                         GASTROENTEROLOGY OFFICE NOTE   NAME:[name], [name] Davenport                        MRN:          [id number]  DATE:05/28/2007                            DOB:          [birth year]    [name] returns today complaining of lower abdominal pain primarily  in the suprapubic area and left lower quadrant.  Her symptoms appear to  be exacerbated by bowel movements.  She has had difficulties with mild  constipation intermittently for years.  She had previously used a fiber  supplement on a regular basis but has not done so for a year or so.  She  notes one episode of a bowel movement which was then associated with a  small amount of blood-tinged mucus per rectum.  She states her symptoms  began following her colonoscopy.  They have been intermittent and not  associated with weight loss, fevers, chills, change in stool caliber,  nausea or vomiting.  In addition, she was treated for a urinary tract  infection in approximately February of this year.  Colonoscopy performed  in January 2008 showed small internal hemorrhoids and two small  adenomatous colon polyps.  She states she was diagnosed with spastic  colon when she was a teenager.  She is status post a total abdominal  hysterectomy with bilateral salpingo-oophorectomy due to endometriosis  in 1999.   Current medications listed on the chart, updated and reviewed.   MEDICATION ALLERGIES:  EPINEPHRINE, ANTIHISTAMINES, PENICILLIN, KEFLEX,  ERYTHROMYCIN, NEXIUM, MORPHINE.   PHYSICAL EXAMINATION:  GENERAL:  In no acute distress.  VITAL SIGNS:  Weight 122.4 pounds, blood pressure is 138/78, pulse 80  and regular.  HEENT:  Anicteric sclerae.  Oropharynx clear.  CHEST:  Clear to auscultation bilaterally.  CARDIAC:  Regular rate and rhythm without murmurs appreciated.  ABDOMEN:  Soft with minimal left lower quadrant tenderness to deep  palpation.  No rebound or guarding.  No palpable  organomegaly, masses or  hernias.  Normoactive bowel sounds.   ASSESSMENT AND PLAN:  Mild constipation, suspected minimal hemorrhoidal  bleeding, and suspected irritable bowel syndrome.  She is to increase  her fiber and fluid intake and begin taking Benefiber on a daily basis.  Will obtain a CBC, CMET, lipase and TSH today.  Schedule a CT scan of  the abdomen and pelvis for further evaluation.  Plan for return office  visit in 4-6 weeks.  Consider the addition of an antispasmodic.     [name], MD, [facility]  Electronically Signed    MTS/MedQ  DD: 05/28/2007  DT: 05/29/2007  Job #: [id number]   cc:   Cassell Clement, M.D.

## 2011-04-17 NOTE — Assessment & Plan Note (Signed)
[facility]                             PULMONARY OFFICE NOTE   NAME:[name]                   MRN:          [id number]  DATE:06/09/2007                            DOB:          [birth year]    NEW SLEEP MEDICINE PATIENT   PROBLEM:  Obstructive sleep apnea.   HISTORY:  This 65 year old woman is followed by Dr.  Patty [name] for  primary care and was previously worked up by me for obstructive sleep  apnea at the old office. She had had a standard sleep study on May 27, 2003, recording an apnea hypopnea index of 18 per hour, desaturating to  82% with mild snoring and normal cardiac rhythm at that time, some  bradycardia. She was subsequently titrated on CPAP, but could never  tolerate any of the masks tried and she eventually gave up and quit  CPAP. She looked into oral appliances, but had no confidence in the  local dental medicine people here. She called to ask what new therapies  might be available so our staff brought her in. Her husband says that if  she will sleep on her side, snoring is prevented and she really has  obvious apnea that only involves sleeping on her back. He is a deep  sleeper and does not pay a lot of attention. Her arthritis has gotten to  a point where she is not comfortable sustaining lateral sleep positions  throughout the night. She does admit to some daytime sleepiness. Bedtime  is between 11 and midnight. Estimated short sleep latency. She will  sleep about four hours and then begin waking every hour or so until  finally up around 7:30 a.m. She has lost a few pounds in recent years.   MEDICATIONS:  1. Ativan 1 mg b.i.d. p.r.n.  2. Axid 150 mg b.i.d.  3. Tekturna 150 mg.  4. Aspirin 81 mg.   DRUG INTOLERANCES:  EPINEPHRINE, ANTIHISTAMINES, PENICILLIN, KEFLEX,  ERYTHROMYCIN, NEXIUM AND MORPHINE.   REVIEW OF SYSTEMS:  Watery rhinorrhea, mild nasal septal deviation with  stuffiness. Weight down about 9 pounds  over the last two years.   PAST MEDICAL HISTORY:  1. Hypertension.  2. Bradytachy syndrome now with pacemaker.  3. Elevated cholesterol.  4. Skin cancer.  5. Allergic rhinitis.  6. Sleep apnea.  7. Eye doctor has told her she has dry eyes. She has had no ENT      surgery. Has her own teeth with some repairs. 8.  Treated for      esophageal reflux.  8. Hysterectomy.  9. She is working with Dr.  Russella [name] and has had colonoscopy with      polypectomy. Recent CT of abdomen and pelvis questions a mass in      the ascending colon and she is scheduled for a repeat colonoscopy.      She has no cardiopulmonary disease history otherwise.  10.She had had a partial thyroidectomy.  11.Inguinal hernia repair.   SOCIAL HISTORY:  Never smoked. Married.   FAMILY HISTORY:  Sister with sleep apnea and they suspect her father had  it.  OBJECTIVE:  Weight 122 pounds.  Blood pressure 120/78, pulse 62.  Room  air saturation is 96%.  This is a trim, alert woman, has her own teeth. There is minor septal  deviation, but she can breath comfortably with her mouth closed. Some  dental repair, but no bridge work. Palate spacing 2-3/4 with long thin  palate. Voice quality is normal with no stridor.  Heart sounds are regular (pacemaker).  Lung sounds are clear.  There is no edema, tremor or cyanosis.   IMPRESSION:  Obstructive sleep apnea which has been positional, but she  can no longer sleep comfortably off of the flat of her back. History of  intolerance to CPAP as originally tried. I have discussed the  availability of a repeat sleep study and the potential that newer CPAP  masks would be more comfortable for her, but her first interest is in  getting fitted with an oral appliance and we are directing her to the  dental faculty at [facility]. She will contact them and decide what to do  about followup. For now, she will return p.r.n. We have discussed her  responsibility to drive safely and increased risk  of untreated sleep  apnea in the context of sedation for medical intervention. She indicated  understanding.     [name] D. Maple Hudson, MD, [name], FACP  Electronically Signed    CDY/MedQ  DD: 06/09/2007  DT: 06/10/2007  Job #: [id number]   cc:   [name], M.D.

## 2011-04-18 ENCOUNTER — Encounter: Payer: Self-pay | Admitting: *Deleted

## 2011-04-19 ENCOUNTER — Ambulatory Visit (INDEPENDENT_AMBULATORY_CARE_PROVIDER_SITE_OTHER): Payer: 59 | Admitting: Cardiology

## 2011-04-19 ENCOUNTER — Encounter: Payer: Self-pay | Admitting: Cardiology

## 2011-04-19 DIAGNOSIS — Z950 Presence of cardiac pacemaker: Secondary | ICD-10-CM

## 2011-04-19 DIAGNOSIS — R079 Chest pain, unspecified: Secondary | ICD-10-CM

## 2011-04-19 DIAGNOSIS — M179 Osteoarthritis of knee, unspecified: Secondary | ICD-10-CM

## 2011-04-19 DIAGNOSIS — I10 Essential (primary) hypertension: Secondary | ICD-10-CM

## 2011-04-19 DIAGNOSIS — E785 Hyperlipidemia, unspecified: Secondary | ICD-10-CM

## 2011-04-19 DIAGNOSIS — M1710 Unilateral primary osteoarthritis, unspecified knee: Secondary | ICD-10-CM

## 2011-04-19 DIAGNOSIS — IMO0002 Reserved for concepts with insufficient information to code with codable children: Secondary | ICD-10-CM

## 2011-04-19 MED ORDER — NITROGLYCERIN 0.4 MG SL SUBL: 0.4000 mg | SUBLINGUAL_TABLET | SUBLINGUAL | Status: DC | PRN

## 2011-04-19 NOTE — Assessment & Plan Note (Signed)
The patient is attempting to walk to help her blood pressure.  She's avoiding salt.  She is tolerating hydrochlorothiazide.  She's not having any headaches or increased dizziness.

## 2011-04-19 NOTE — Progress Notes (Signed)
[name] Date of Birth:  10/14/1946 [facility] / [facility] [address]   [address] [place], [place]  [address] [phone]           Fax   [phone]  History of Present Illness: This pleasant 65 year old married Caucasian female is seen for a scheduled 4 month followup office visit.  She has a past history of palpitations and a history of essential hypertension.  She has a history of tachybradycardia syndrome and has a functioning dual-chamber pacemaker.  Her blood pressure has been responding to hydrochlorothiazide 25 mg daily.  She has been trying to walk for exercise.  She has osteoarthritis of her knees and her orthopedist has recommended total knee replacement but she has been reluctant to proceed with that.  She has had some response to periodic injections of the knee.  She has a history of GERD and is on proton pump inhibitor.  Her last echocardiogram was 09/19/07 and showed an ejection fraction of 55-60% and mild aortic sclerosis and trace mitral regurgitation.  Her last nuclear stress test was a one-day LexiScan study on 05/17/09 showing no ischemia.  Current Outpatient Prescriptions  Medication Sig Dispense Refill  . ATIVAN 1 MG tablet TAKE ONE TABLET BY MOUTH 4 TIMES DAILY  120 each  5  . hydrochlorothiazide 25 MG tablet Take 1 tablet (25 mg total) by mouth daily.  30 tablet  11  . nitroGLYCERIN (NITROSTAT) 0.4 MG SL tablet Place 1 tablet (0.4 mg total) under the tongue every 5 (five) minutes as needed. For up to 3 doses  25 tablet  3  . nizatidine (AXID) 150 MG capsule Take 150 mg by mouth 2 (two) times daily.        Marland Kitchen DISCONTD: nitroGLYCERIN (NITROSTAT) 0.4 MG SL tablet Place 0.4 mg under the tongue every 5 (five) minutes as needed. For up to 3 doses         Allergies  Allergen Reactions  . Antihistamines, Loratadine-Type   . Bisoprolol-Hydrochlorothiazide   . Cephalexin   . Epinephrine   . Erythromycin     REACTION: nausea  . Lidocaine   .  Losartan Potassium   . Meperidine Hcl   . Morphine   . Paroxetine   . Penicillins   . Propranolol Hcl   . Sertraline Hcl   . Tetracycline   . Verapamil     Patient Active Problem List  Diagnoses  . HYPERLIPIDEMIA  . HYPERTENSION  . BRADYCARDIA  . HEMORRHOIDS  . GERD  . INGUINAL HERNIA  . SLEEP APNEA  . CHEST PAIN  . SKIN CANCER, HX OF  . COLONIC POLYPS, ADENOMATOUS, HX OF  . Internal hemorrhoids  . Rectal or anal pain  . History of colon polyps  . Constipation  . Osteoarthritis of knee  . Biventricular cardiac pacemaker in situ    History  Smoking status  . Never Smoker   Smokeless tobacco  . Not on file    History  Alcohol Use No    Family History  Problem Relation Age of Onset  . Colon cancer Neg Hx   . Heart failure Sister   . Dementia Mother   . Heart disease      Review of Systems: Constitutional: no fever chills diaphoresis or fatigue or change in weight.  Head and neck: no hearing loss, no epistaxis, no photophobia or visual disturbance. Respiratory: No cough, shortness of breath or wheezing. Cardiovascular: No chest pain peripheral edema, palpitations. Gastrointestinal: No abdominal  distention, no abdominal pain, no change in bowel habits hematochezia or melena. Genitourinary: No dysuria, no frequency, no urgency, no nocturia. Musculoskeletal:No arthralgias, no back pain, no gait disturbance or myalgias. Neurological: No dizziness, no headaches, no numbness, no seizures, no syncope, no weakness, no tremors. Hematologic: No lymphadenopathy, no easy bruising. Psychiatric: No confusion, no hallucinations, no sleep disturbance.    Physical Exam: Filed Vitals:   04/19/11 1432  BP: 130/78  Pulse: 80  The general appearance feels a well-developed well-nourished woman in no distress.Pupils equal and reactive.   Extraocular Movements are full.  There is no scleral icterus.  The mouth and pharynx are normal.  The neck is supple.  The carotids reveal  no bruits.  The jugular venous pressure is normal.  The thyroid is not enlarged.  There is no lymphadenopathy.The chest is clear to percussion and auscultation. There are no rales or rhonchi. Expansion of the chest is symmetrical.The precordium is quiet.  The first heart sound is normal.  The second heart sound is physiologically split.  There is no murmur gallop rub or click.  There is no abnormal lift or heave.  The pacemaker is in the right upper chest.The abdomen is soft and nontender. Bowel sounds are normal. The liver and spleen are not enlarged. There Are no abdominal masses. There are no bruits.The pedal pulses are good.  There is no phlebitis or edema.  There is no cyanosis or clubbing.Strength is normal and symmetrical in all extremities.  There is no lateralizing weakness.  There are no sensory deficits.The skin is warm and dry.  There is no rash.There are no overt psychiatric signs or symptoms.   Assessment / Plan: Continue same medication.  Recheck in 4 months and get fasting lab work ahead of time

## 2011-04-19 NOTE — Assessment & Plan Note (Signed)
The patient has been having less chest pain.  She does not have any known coronary disease she had a normal Cardiolite stress test 05/17/09.

## 2011-04-19 NOTE — Assessment & Plan Note (Signed)
The patient has a biventricular pacemaker which was implanted on 05/22/05 by Dr. Reyes Ivan for sick sinus syndrome.  She has not been having a problem from a pacemaker.  She has not been aware of any recent palpitations

## 2011-04-19 NOTE — Assessment & Plan Note (Signed)
The patient has a long history of hypercholesterolemia.  She is intolerant of many medications including statin drugs.  She is controlling her cholesterol with diet and exercise.

## 2011-04-20 NOTE — Discharge Summary (Signed)
[name], [name]               ACCOUNT NO.:  [id number]   MEDICAL RECORD NO.:  [id number]          PATIENT TYPE:  OIB   LOCATION:  3743                         FACILITY:  [facility]   PHYSICIAN:  Elmore Guise., M.D.DATE OF BIRTH:  [birth year]   DATE OF ADMISSION:  05/22/2005  DATE OF DISCHARGE:  05/23/2005                                 DISCHARGE SUMMARY   DISCHARGE DIAGNOSES:  1.  Sick sinus syndrome.  2.  Hypertension.  3.  Gastroesophageal reflux disorder.   HISTORY OF PRESENT ILLNESS:  The patient is a very pleasant 65 year old  white female who was admitted with symptomatic bradycardia and sick sinus  syndrome for elective dual-chamber permanent pacemaker implant.   HOSPITAL COURSE:  The patient underwent pacemaker implant on May 22, 2005.  The first procedure she tolerated well. She had appropriate positioning of  her atrial and ventricular leads, however, on positioning the patient from  the cardiac catheterization laboratory table to the stretcher, a change in  her QRS complex was noticed. She was taken back to the placed back on the  catheterization laboratory table. Fluoroscopy showed lead dislodgement of  her atrial lead. She was then redraped and atrial lead revision was  performed before she actually left the catheterization laboratory room. The  atrial lead was placed back in a different position with excellent  thresholds and impedances. Multiple fluoroscopy checks were done on pocket  closure. Prior to her leaving the catheterization laboratory table, her  atrial and ventricular leads were again verified of there appropriate  positions. Once she got back to the recovery room, her device was then  checked and showed excellent thresholds and impedances. The rest of her  hospitalization was uncomplicated. She is now done well. She has been up and  ambulatory. Telemetry showed atrial lead pacing with ventricular sensing of  her pacemaker lead. Her atrial  threshold was 0.5 volts at 0.5 msec and her  ventricular threshold was 0.25 volts at 0.5 msec. Both ventricular and  atrial impedances were normal. She will be discharged home today to continue  the following medications.   DISCHARGE MEDICATIONS:  1.  Altace 2.5 mg every other day.  2.  Axid 150 b.i.d.  3.  Ativan 1 mg p.r.n.  4.  Azithromycin 500 mg daily for the next five days.  5.  She will take Advil, Aleve or Tylenol for a p.r.n. basis.   WOUND CARE:  She was instructed not to get her pacemaker site wet with for  the next five days. She is to use Betadine on her Steri-Strips daily for the  next three days. She was given instructions regarding her restrictions from  a pacemaker implant standpoint. She will wear her sling the next 48 hours  and then during sleep for the next week.   FOLLOWUP:  She will follow up with Dr. Reyes Ivan at [facility] in 7-  10 days for wound check. She will notify the office should she have any  problems or questions.       TWK/MEDQ  D:  05/23/2005  T:  05/23/2005  Job:  [id number]   cc:   Cassell Clement, M.D.  [address], [address]  [place]  [place] [address]  Fax: [phone]

## 2011-04-20 NOTE — Cardiovascular Report (Signed)
[name], [name]               ACCOUNT NO.:  [id number]   MEDICAL RECORD NO.:  [id number]          PATIENT TYPE:  OIB   LOCATION:  2857                         FACILITY:  [facility]   PHYSICIAN:  Elmore Guise., M.D.DATE OF BIRTH:  Nov 28, 1946   DATE OF PROCEDURE:  05/22/2005  DATE OF DISCHARGE:                              CARDIAC CATHETERIZATION   INDICATION FOR PROCEDURE:  Atrial lead revision procedure.   PROCEDURE DESCRIPTION:  After the patient underwent dual-chamber permanent  pacemaker, she was being taken from the table. On sitting up, a change in  her QRS complexes was noticed. Fluoroscopy was then performed showing atrial  lead dislodgement. The patient was then placed back on the catheterization  laboratory table was reprepped and draped for atrial lead revision. The  patient was given 5 mL of Novocain for local anesthesia. The sutures from  the procedure was then removed with a scalpel. The generator was  disconnected from the atrial and ventricular leads. The ventricular lead was  checked first and was functioning well. The atrial lead was then  repositioned with the following threshold and impedence:  P-waves measured  6.1 mV with a 5.0 volt and impedance of 1471 ohms. Threshold was maintained  at 0.6 volts with a pulse width of 0.5 msec at a current drain of 0.4 mA.  After appropriate placement of the atrial lead, the atrial lead was then  placed back in the impulse generator. The ventricular lead was then placed  in the generator. The generator was sewn into the pocket was 0 suture. The  pocket was irrigated with kanamycin solution. The pocket was then closed in  three continuous layers with 2-0 followed by 2-0 followed by 4-0 Vicryl  suture. The patient tolerated procedure well. No apparent complications. She  was discharged from the cardiac catheterization laboratory in stable  condition.       TWK/MEDQ  D:  05/22/2005  T:  05/22/2005  Job:  [id number]   cc:    Cassell Clement, M.D.  [address]  [place]  [place] [address]  Fax: [phone]

## 2011-04-20 NOTE — Cardiovascular Report (Signed)
[name], [name]               ACCOUNT NO.:  [id number]   MEDICAL RECORD NO.:  [id number]          PATIENT TYPE:  OIB   LOCATION:  2857                         FACILITY:  [facility]   PHYSICIAN:  Elmore Guise., M.D.DATE OF BIRTH:  1946-06-27   DATE OF PROCEDURE:  05/22/2005  DATE OF DISCHARGE:                              CARDIAC CATHETERIZATION   Dual chamber pacemaker implant.   INDICATIONS FOR PROCEDURE:  Sick sinus syndrome.   DESCRIPTION OF PROCEDURE:  The patient was brought to the cardiac cath lab  after proper informed consent.  She was prepped and draped in sterile  fashion. Approximately 20 mL of 1% Marcaine used for local anesthesia. A 2  inch incision was made in the left deltopectoral groove. A venogram was then  performed showing access of the axillary and subclavian veins on the left  side. The left axillary vein was then accessed in two separate sticks with  fluoro guidance.  A 7-French safety sheath was then placed over two retained  wires. The ventricular lead was a Medtronic active fixation 5076 - 52 cm,  serial number [id number].  It was placed in the right ventricle.  R-waves  measured 24.6 mV with a 5.0 volt impedance and impedance of 842 ohms,  threshold was maintained at 1.0 volts with pulse width of 0.5 milliseconds,  current at 1.2 mA. The atrial lead was then placed.  The atrial lead was a  Medtronic active fixation 5076 - 45 cm, serial number [id number].  It was  placed in the right atrium. P-waves measured 2.9 mV with a 5.0 volts  impedance of 640 ohms, threshold was maintained at 0.8 volts with a pulse  width 0.5 milliseconds and a current of 1.3 mA. A Medtronic EnPulse model  [id number], serial number [id number] was then attached to the ventricle and  atrial leads. The generator was sewn into the pocket. The pocket was then  closed in three separate layers with 2-0, followed by 2-0, followed by 4-0  Vicryl suture. The patient tolerated procedure well.  No apparent  complications.       TWK/MEDQ  D:  05/22/2005  T:  05/22/2005  Job:  [id number]

## 2011-04-20 NOTE — Assessment & Plan Note (Signed)
[facility]                         GASTROENTEROLOGY OFFICE NOTE   NAME:Rohl, Bolivar Haw             MRN:          [id number]  DATE:12/26/2006                            DOB:          1946-01-22    Ms. Flippo is a 65 year old white female who was seen in the emergency  room two days after her colonoscopy with Dr. Russella Dar, with abdominal pain  which started within 24 hours after the procedure.  The procedure report  describes the patient having two polyps, one in the transverse colon  measuring 5-mm with a sessile polyp which was removed without cautery.  She had small hemorrhoids and a small polyp in the rectum which was also  ablated without the cautery effect.  The patient reports to me that she  woke up during the procedure having a lot of pain.  Total amount of  sedation included Versed 10 mg IV and Fentanyl 75 mcg IV.  She had two  small bowel movements today.  No fever.  No rectal bleeding.   PHYSICAL EXAMINATION:  VITAL SIGNS:  Blood pressure 155/88, pulse 85,  respirations 16, temperature 98.4.  GENERAL:  The patient was alert, oriented, in no distress.  LUNGS:  Clear to auscultation.  COR:  Normal S1 S2.  ABDOMEN:  Soft with hyperactive bowel sounds.  No distention.  Tenderness in the left upper quadrant.  No rebound.  EXTREMITIES:  Straight leg raising negative.  RECTAL:  No stool.   LABORATORY DATA:  KUB shows normal gas pattern, a small amount of stool  in the colon.   IMPRESSION:  A 64 year old white female with abdominal pain which may be  related  either to an abdominal pressure applied during the procedure or  due  to possible post polypectomy inflammation at the site of the  polypectomy.  Since no cautery was used during the procedure, I do not  believe we are dealing with a bowl burn. There if no mevidence of acute  abdomen.   PLAN:  1. I have discussed this with the patient who will stay on full      liquids for the  next 24-48 hours.  2. Take Cipro 250 p.o. b.i.d. x5 days.  3. Take Tylenol for pain, patient did not want to have any narcotics      for control of pain.  4. She will call us in the next 24-48 hours if the pain becomes worse.     Hedwig Morton. Juanda Chance, MD  Electronically Signed    DMB/MedQ  DD: 12/26/2006  DT: 12/26/2006  Job #: [id number]   cc:   [name], MD, Clementeen Graham

## 2011-04-20 NOTE — Assessment & Plan Note (Signed)
[name]                           GASTROENTEROLOGY OFFICE NOTE   [name], [name]             MRN:          [id number]  DATE:09/23/2006                            DOB:          1946-05-19    PROBLEM:  1. Follow up colonoscopy.  2. Gastroesophageal reflux disease history.   [name] is a pleasant 64 year old white female.  She does have history of  hypertension and cardiac arrhythmia.  She is status post pacemaker in 2006.  She had a remote thyroidectomy.  Has history of hypertension, depression,  sleep apnea, is status post hernia repair and hysterectomy.  She last  underwent colonoscopy with Dr. Russella Dar in February of 2001; was found to have  1 sigmoid colon polyp at that time; a somewhat tortuous sigmoid and small  internal hemorrhoids.  Pathology on that polyp consistent with a  hyperplastic polyp.  She also underwent endoscopy in August of 2003; at that  time with GERD and complaints of dysphagia.  She did not have a stricture;  was empirically dilated Maloney to 16 mm.  She currently comes in for  followup regarding colonoscopy.  She does tend to have problems with mild  constipation.  She says if she watches her diet and takes Metamucil, she  does pretty well; occasionally has to use a glycerine suppository.  She has  no complaints currently of abdominal pain.  Occasionally gets some gas and  bloating, and says very occasionally she will notice a small amount of  bright red blood on the tissue, which she attributes to hemorrhoids.   She has also noticed a left lower quadrant lump, which has been present  over the past 8 to 9 months.  She had been evaluated by her gynecologist,  who felt that this may actually be related to scar tissue.  She had a hernia  repair on that side, and has also had an abdominal incision from her  hysterectomy.   Patient has chronic problems with GERD.  Currently takes Axid twice daily.  She said she  has tried several PTI's, which either did not work or caused  her side-effects with stomach upset, bloating, etc.  She says the Axid does  not completely control her symptoms, but does pretty well.  She has no  current complaints of dysphagia or odynophagia.   CURRENT MEDICATIONS:  1. Ativan 1 mg b.i.d.  2. Axid 150 b.i.d.  3. Digitek 0.125 daily.   ALLERGY INTOLERANCE:  1. EPINEPHRINE.  2. ANTIHISTAMINES.  3. PENICILLIN.  4. KEFLEX.  5. ERYTHROMYCIN.  6. NEXIUM.  7. MORPHINE.   PAST HISTORY:  As outlined above.   FAMILY HISTORY:  One aunt with colon polyps.  There is no family history of  colon cancer that she is aware of.  She does have family history of breast  cancer in an aunt and ovarian cancer in an aunt.  Grandparents and an aunt  and uncle with diabetes.  Several relatives with heart disease.   SOCIAL HISTORY:  The patient is married.  She is employed in childcare.  She  is a nonsmoker and nondrinker.   REVIEW OF SYMPTOMS:  HEENT:  Pertinent for eyeglasses.  MUSCULOSKELETAL:  She does have arthritis.  PULMONARY:  Positive for sleep apnea with CPAP.  GI:  As outlined above.  GU:  Negative.  Otherwise, reviewed and negative.   PHYSICAL EXAMINATION:  GENERAL:  Well-developed, thin, white female, in no  acute distress.  VITAL SIGNS:  Height is 5 foot 5 inches.  Weight is 122.4.  Blood pressure  150/80.  Pulse is 68.  HEENT:  Atraumatic and normocephalic.  EOMI.  PERRLA.  Sclerae anicteric.  CARDIOVASCULAR:  Regular rate and rhythm with S1 and S2.  PULMONARY:  Clear to A&P.  ABDOMEN:  Soft.  Bowel sounds are active.  She is non-tender.  There is no  palpable mass or hepatosplenomegaly.  She does have a small firm area in the  left inguinal region, which is probably a scar, just up above the inguinal  canal.  RECTAL:  Exam is not done today.   IMPRESSION:  1. Gastroesophageal reflux disease with fair control with H2 blocker.  2. History of colon polyp, hyperplastic,  due for followup.  3. Mild constipation.   PLAN:  1. Schedule colonoscopy at her convenience.  2. High-fiber diet and add Benifiber trial on a daily basis.  3. Patient was given a 3-week supply Zegerid 20 mg p.o. q. a.m. to try.      If this is effective, we      will call her in a prescription for this; if not, she may want to just      continue on Axid, which she has been on for some time.     ______________________________  Mike Gip, PA-C    ______________________________  [name], MD, Clement[facility] Graham   AE/MedQ DD:  09/23/2006 DT:  09/24/2006 Job #:  [id number]   cc:   Cassell Clement, M.D.

## 2011-05-03 ENCOUNTER — Other Ambulatory Visit: Payer: Self-pay | Admitting: Internal Medicine

## 2011-05-03 ENCOUNTER — Ambulatory Visit (INDEPENDENT_AMBULATORY_CARE_PROVIDER_SITE_OTHER): Payer: 59 | Admitting: *Deleted

## 2011-05-03 DIAGNOSIS — I495 Sick sinus syndrome: Secondary | ICD-10-CM

## 2011-05-09 NOTE — Progress Notes (Signed)
PACER REMOTE 

## 2011-05-15 ENCOUNTER — Encounter: Payer: Self-pay | Admitting: *Deleted

## 2011-08-02 ENCOUNTER — Other Ambulatory Visit: Payer: Self-pay

## 2011-08-02 ENCOUNTER — Encounter: Payer: Self-pay | Admitting: Internal Medicine

## 2011-08-02 ENCOUNTER — Ambulatory Visit (INDEPENDENT_AMBULATORY_CARE_PROVIDER_SITE_OTHER): Payer: 59 | Admitting: *Deleted

## 2011-08-02 DIAGNOSIS — I495 Sick sinus syndrome: Secondary | ICD-10-CM

## 2011-08-03 LAB — REMOTE PACEMAKER DEVICE
BAMS-0001: 175 {beats}/min
RV LEAD AMPLITUDE: 22.4 mv
VENTRICULAR PACING PM: 0

## 2011-08-07 ENCOUNTER — Encounter: Payer: Self-pay | Admitting: *Deleted

## 2011-08-14 ENCOUNTER — Other Ambulatory Visit: Payer: Self-pay | Admitting: Cardiology

## 2011-08-14 DIAGNOSIS — I10 Essential (primary) hypertension: Secondary | ICD-10-CM

## 2011-08-14 DIAGNOSIS — E785 Hyperlipidemia, unspecified: Secondary | ICD-10-CM

## 2011-08-14 DIAGNOSIS — I498 Other specified cardiac arrhythmias: Secondary | ICD-10-CM

## 2011-08-16 ENCOUNTER — Other Ambulatory Visit (INDEPENDENT_AMBULATORY_CARE_PROVIDER_SITE_OTHER): Payer: 59 | Admitting: *Deleted

## 2011-08-16 ENCOUNTER — Other Ambulatory Visit: Payer: Self-pay | Admitting: Cardiology

## 2011-08-16 DIAGNOSIS — I498 Other specified cardiac arrhythmias: Secondary | ICD-10-CM

## 2011-08-16 DIAGNOSIS — E785 Hyperlipidemia, unspecified: Secondary | ICD-10-CM

## 2011-08-16 DIAGNOSIS — I10 Essential (primary) hypertension: Secondary | ICD-10-CM

## 2011-08-16 LAB — LDL CHOLESTEROL, DIRECT: Direct LDL: 141.5 mg/dL

## 2011-08-16 LAB — BASIC METABOLIC PANEL
BUN: 17 mg/dL (ref 6–23)
CO2: 31 mEq/L (ref 19–32)
Chloride: 95 mEq/L — ABNORMAL LOW (ref 96–112)
Potassium: 4.5 mEq/L (ref 3.5–5.1)

## 2011-08-16 LAB — LIPID PANEL
Cholesterol: 228 mg/dL — ABNORMAL HIGH (ref 0–200)
Total CHOL/HDL Ratio: 3
Triglycerides: 72 mg/dL (ref 0.0–149.0)
VLDL: 14.4 mg/dL (ref 0.0–40.0)

## 2011-08-16 LAB — HEPATIC FUNCTION PANEL
Albumin: 4.3 g/dL (ref 3.5–5.2)
Alkaline Phosphatase: 44 U/L (ref 39–117)

## 2011-08-17 NOTE — Progress Notes (Signed)
Pacer checked by remote 

## 2011-08-21 ENCOUNTER — Ambulatory Visit (INDEPENDENT_AMBULATORY_CARE_PROVIDER_SITE_OTHER): Payer: 59 | Admitting: Cardiology

## 2011-08-21 VITALS — BP 140/70 | HR 60 | Wt 123.0 lb

## 2011-08-21 DIAGNOSIS — I119 Hypertensive heart disease without heart failure: Secondary | ICD-10-CM

## 2011-08-21 DIAGNOSIS — K219 Gastro-esophageal reflux disease without esophagitis: Secondary | ICD-10-CM

## 2011-08-21 DIAGNOSIS — I10 Essential (primary) hypertension: Secondary | ICD-10-CM

## 2011-08-21 DIAGNOSIS — E785 Hyperlipidemia, unspecified: Secondary | ICD-10-CM

## 2011-08-21 DIAGNOSIS — Z950 Presence of cardiac pacemaker: Secondary | ICD-10-CM

## 2011-08-21 DIAGNOSIS — M1710 Unilateral primary osteoarthritis, unspecified knee: Secondary | ICD-10-CM

## 2011-08-21 NOTE — Progress Notes (Signed)
[name] Date of Birth:  February 16, 1946 [facility] / [facility] [address]   [address] [place], [place]  [address] [phone]           Fax   [phone]  History of Present Illness: This pleasant 65 year old woman is seen for a scheduled 4 month followup office visit.  She has a past history of palpitations and a history of essential hypertension.  She's also had a history of borderline elevated lipids in the past.  She has tachybradycardia syndrome and has a functioning dual-chamber pacemaker.  She has a history of GERD and is on proton pump inhibitor.  She does not have any history of ischemic heart disease.  Her last nuclear stress test was a LexiScan study on 05/17/09 showing no ischemia.  Current Outpatient Prescriptions  Medication Sig Dispense Refill  . ATIVAN 1 MG tablet TAKE ONE TABLET BY MOUTH 4 TIMES DAILY  120 each  5  . hydrochlorothiazide 25 MG tablet Take 1 tablet (25 mg total) by mouth daily.  30 tablet  11  . nitroGLYCERIN (NITROSTAT) 0.4 MG SL tablet Place 1 tablet (0.4 mg total) under the tongue every 5 (five) minutes as needed. For up to 3 doses  25 tablet  3  . nizatidine (AXID) 150 MG capsule Take 150 mg by mouth 2 (two) times daily.          Allergies  Allergen Reactions  . Antihistamines, Loratadine-Type   . Bisoprolol-Hydrochlorothiazide   . Cephalexin   . Epinephrine   . Erythromycin     REACTION: nausea  . Lidocaine   . Losartan Potassium   . Meperidine Hcl   . Morphine   . Paroxetine   . Penicillins   . Propranolol Hcl   . Sertraline Hcl   . Tetracycline   . Verapamil     Patient Active Problem List  Diagnoses  . HYPERLIPIDEMIA  . HYPERTENSION  . BRADYCARDIA  . HEMORRHOIDS  . GERD  . INGUINAL HERNIA  . SLEEP APNEA  . CHEST PAIN  . SKIN CANCER, HX OF  . COLONIC POLYPS, ADENOMATOUS, HX OF  . Internal hemorrhoids  . Rectal or anal pain  . History of colon polyps  . Constipation  . Osteoarthritis of knee  .  Biventricular cardiac pacemaker in situ    History  Smoking status  . Never Smoker   Smokeless tobacco  . Not on file    History  Alcohol Use No    Family History  Problem Relation Age of Onset  . Colon cancer Neg Hx   . Heart failure Sister   . Dementia Mother   . Heart disease      Review of Systems: Constitutional: no fever chills diaphoresis or fatigue or change in weight.  Head and neck: no hearing loss, no epistaxis, no photophobia or visual disturbance. Respiratory: No cough, shortness of breath or wheezing. Cardiovascular: No chest pain peripheral edema, palpitations. Gastrointestinal: No abdominal distention, no abdominal pain, no change in bowel habits hematochezia or melena. Genitourinary: No dysuria, no frequency, no urgency, no nocturia. Musculoskeletal:No arthralgias, no back pain, no gait disturbance or myalgias. Neurological: No dizziness, no headaches, no numbness, no seizures, no syncope, no weakness, no tremors. Hematologic: No lymphadenopathy, no easy bruising. Psychiatric: No confusion, no hallucinations, no sleep disturbance.    Physical Exam: Filed Vitals:   08/21/11 1817  BP: 140/70  Pulse: 60  The general appearance reveals a well-developed well-nourished woman in no distress.Pupils equal and  reactive.   Extraocular Movements are full.  There is no scleral icterus.  The mouth and pharynx are normal.  The neck is supple.  The carotids reveal no bruits.  The jugular venous pressure is normal.  The thyroid is not enlarged.  There is no lymphadenopathy.  The chest is clear to percussion and auscultation. There are no rales or rhonchi. Expansion of the chest is symmetrical.  The precordium is quiet.  The first heart sound is normal.  The second heart sound is physiologically split.  There is no murmur gallop rub or click.  There is no abnormal lift or heave.  The abdomen is soft and nontender. Bowel sounds are normal. The liver and spleen are not  enlarged. There Are no abdominal masses. There are no bruits.  The pedal pulses are good.  There is no phlebitis or edema.  There is no cyanosis or clubbing.  Strength is normal and symmetrical in all extremities.  There is no lateralizing weakness.  There are no sensory deficits.  The skin is warm and dry.  There is no rash.     Assessment / Plan: Continue same medication and add low-dose generic Lipitor 10 mg daily.  We also gave her a prescription to get a shingles shot.  Recheck here in 4 months for office visit and fasting lab work

## 2011-08-21 NOTE — Assessment & Plan Note (Signed)
The patient has a long history of elevated cholesterol. Her LDL is unacceptably high at 141 and she is watching her diet.  We will give her a trial of low-dose generic Lipitor 10 mg daily.  She was warned about possible side effects such as myalgias.  She is intolerant to many medications and may not be able to take the Lipitor.

## 2011-08-21 NOTE — Assessment & Plan Note (Signed)
The patient has a history of tachybradycardia syndrome and has a functioning dual-chamber pacemaker.  She has an appointment to see Dr. Johney Frame concerning her pacemaker in November.  She has not been aware of any recent tachycardia symptoms.

## 2011-08-21 NOTE — Assessment & Plan Note (Signed)
The patient is not having any headaches or dizzy spells.  She is tolerating the hydrochlorothiazide adequately.

## 2011-08-21 NOTE — Assessment & Plan Note (Signed)
The patient has significant osteoarthritis of both knees but particularly the right knee.  She has had a recent trial of Synvisc from her orthopedist Dr. Sherlean Foot.  She is trying to avoid a total knee replacement.

## 2011-08-21 NOTE — Assessment & Plan Note (Signed)
The patient has been having more problems with GERD since she had to go on a generic form of Axid.  She finds that time she has to supplement with Mylanta p.r.n.

## 2011-09-06 ENCOUNTER — Telehealth: Payer: Self-pay | Admitting: Cardiology

## 2011-09-06 NOTE — Telephone Encounter (Signed)
STop lipitor.  STrict diet

## 2011-09-06 NOTE — Telephone Encounter (Signed)
Advised patient

## 2011-09-06 NOTE — Telephone Encounter (Signed)
Having a lot of trouble with stomach and arms did get tired and ached with routine things she normally does. Even tried cutting in half and no better.  Please advise

## 2011-09-06 NOTE — Telephone Encounter (Signed)
Pt calling stating she can no longer take Lipitor.  Pt c/o aching arms and upset stomach since taking medication. Please return pt call to discuss options for medication alternatives.   Cell : [phone]

## 2011-10-10 ENCOUNTER — Encounter: Payer: Self-pay | Admitting: Internal Medicine

## 2011-10-10 ENCOUNTER — Ambulatory Visit (INDEPENDENT_AMBULATORY_CARE_PROVIDER_SITE_OTHER): Payer: 59 | Admitting: Internal Medicine

## 2011-10-10 DIAGNOSIS — I498 Other specified cardiac arrhythmias: Secondary | ICD-10-CM

## 2011-10-10 DIAGNOSIS — Z950 Presence of cardiac pacemaker: Secondary | ICD-10-CM

## 2011-10-10 LAB — PACEMAKER DEVICE OBSERVATION
AL AMPLITUDE: 5.6 mv
BAMS-0001: 175 {beats}/min
RV LEAD AMPLITUDE: 31.36 mv
RV LEAD THRESHOLD: 0.5 V

## 2011-10-10 NOTE — Patient Instructions (Signed)
Your physician wants you to follow-up in: 12 months with Dr Allred You will receive a reminder letter in the mail two months in advance. If you don't receive a letter, please call our office to schedule the follow-up appointment.   Remote monitoring is used to monitor your Pacemaker of ICD from home. This monitoring reduces the number of office visits required to check your device to one time per year. It allows us to keep an eye on the functioning of your device to ensure it is working properly. You are scheduled for a device check from home on 01/10/2012 You may send your transmission at any time that day. If you have a wireless device, the transmission will be sent automatically. After your physician reviews your transmission, you will receive a postcard with your next transmission date.   

## 2011-10-10 NOTE — Assessment & Plan Note (Signed)
Normal pacemaker function See Pace Art report No changes today  

## 2011-10-10 NOTE — Progress Notes (Signed)
The patient presents today for routine electrophysiology followup.  Since last being seen in our clinic, the patient reports doing very well.  Today, she denies symptoms of palpitations, chest pain, shortness of breath, orthopnea, PND, lower extremity edema, dizziness, presyncope, syncope, or neurologic sequela.  The patient feels that she is tolerating medications without difficulties and is otherwise without complaint today.   Past Medical History  Diagnosis Date  . Colon polyp     adenomatous  . Hemorrhoids   . Inguinal hernia   . Skin cancer   . Hyperlipidemia   . Hypertension   . Sleep apnea   . Allergic rhinitis   . GERD (gastroesophageal reflux disease)   . Bradycardia     s/p MDT PPM  . Atypical chest pain    Past Surgical History  Procedure Date  . Inguinal hernia repair   . Thyroidectomy   . Polypectomy   . Pacemaker insertion 2006    Dr. Reyes Ivan  . Abdominal hysterectomy     Current Outpatient Prescriptions  Medication Sig Dispense Refill  . ATIVAN 1 MG tablet TAKE ONE TABLET BY MOUTH 4 TIMES DAILY  120 each  5  . hydrochlorothiazide 25 MG tablet Take 1 tablet (25 mg total) by mouth daily.  30 tablet  11  . nitroGLYCERIN (NITROSTAT) 0.4 MG SL tablet Place 1 tablet (0.4 mg total) under the tongue every 5 (five) minutes as needed. For up to 3 doses  25 tablet  3  . nizatidine (AXID) 150 MG capsule Take 150 mg by mouth 2 (two) times daily.          Allergies  Allergen Reactions  . Antihistamines, Loratadine-Type   . Bisoprolol-Hydrochlorothiazide   . Cephalexin   . Epinephrine   . Erythromycin     REACTION: nausea  . Lidocaine   . Lipitor (Atorvastatin Calcium)     aches  . Losartan Potassium   . Meperidine Hcl   . Morphine   . Paroxetine   . Penicillins   . Propranolol Hcl   . Sertraline Hcl   . Tetracycline   . Verapamil     History   Social History  . Marital Status: Married    Spouse Name: N/A    Number of Children: 1  . Years of Education:  N/A   Occupational History  .     Social History Main Topics  . Smoking status: Never Smoker   . Smokeless tobacco: Not on file  . Alcohol Use: No  . Drug Use: No  . Sexually Active:    Other Topics Concern  . Not on file   Social History Narrative  . No narrative on file    Family History  Problem Relation Age of Onset  . Colon cancer Neg Hx   . Heart failure Sister   . Dementia Mother   . Heart disease     Physical Exam: Filed Vitals:   10/10/11 1239  BP: 144/91  Pulse: 92  Height: 5' 4.5" (1.638 m)  Weight: 125 lb (56.7 kg)    GEN- The patient is well appearing, alert and oriented x 3 today.   Head- normocephalic, atraumatic Eyes-  Sclera clear, conjunctiva pink Ears- hearing intact Oropharynx- clear Neck- supple, no JVP Lymph- no cervical lymphadenopathy Lungs- Clear to ausculation bilaterally, normal work of breathing Chest- pacemaker pocket is well healed Heart- Regular rate and rhythm, no murmurs, rubs or gallops, PMI not laterally displaced GI- soft, NT, ND, + BS Extremities- no clubbing,  cyanosis, or edema MS- no significant deformity or atrophy Skin- no rash or lesion Psych- euthymic mood, full affect Neuro- strength and sensation are intact  Pacemaker interrogation- reviewed in detail today,  See PACEART report  Assessment and Plan:

## 2011-10-30 DIAGNOSIS — I495 Sick sinus syndrome: Secondary | ICD-10-CM | POA: Insufficient documentation

## 2011-10-30 DIAGNOSIS — I10 Essential (primary) hypertension: Secondary | ICD-10-CM | POA: Insufficient documentation

## 2011-10-30 DIAGNOSIS — Z950 Presence of cardiac pacemaker: Secondary | ICD-10-CM | POA: Insufficient documentation

## 2011-11-06 ENCOUNTER — Ambulatory Visit (INDEPENDENT_AMBULATORY_CARE_PROVIDER_SITE_OTHER): Payer: 59 | Admitting: Obstetrics and Gynecology

## 2011-11-06 ENCOUNTER — Encounter: Payer: Self-pay | Admitting: Obstetrics and Gynecology

## 2011-11-06 ENCOUNTER — Other Ambulatory Visit (HOSPITAL_COMMUNITY)
Admission: RE | Admit: 2011-11-06 | Discharge: 2011-11-06 | Disposition: A | Discharge status: Home / Self Care | Payer: 59 | Source: Ambulatory Visit | Attending: Obstetrics and Gynecology | Admitting: Obstetrics and Gynecology

## 2011-11-06 ENCOUNTER — Other Ambulatory Visit: Payer: Self-pay | Admitting: Obstetrics and Gynecology

## 2011-11-06 VITALS — BP 130/78 | Ht 64.5 in | Wt 124.0 lb

## 2011-11-06 DIAGNOSIS — Z01419 Encounter for gynecological examination (general) (routine) without abnormal findings: Secondary | ICD-10-CM

## 2011-11-06 DIAGNOSIS — Z1231 Encounter for screening mammogram for malignant neoplasm of breast: Secondary | ICD-10-CM

## 2011-11-06 MED ORDER — TERCONAZOLE 0.8 % VA CREA: 1.0000 | TOPICAL_CREAM | Freq: Every day | VAGINAL | Status: AC

## 2011-11-06 NOTE — Progress Notes (Signed)
Patient came to see me today for her annual GYN exam. She does well now without HRT. She still has atrophic vaginitis but could not tolerate vaginal estrogen. She is due for a mammogram in January. She is due for followup bone density due to the low bone mass. She thinks she has a yeast infection. She is having no vaginal bleeding and no pelvic pain. She does her lab work through her cardiologist's office.  HEENT: Within normal limits.  [name] present Neck: No masses. Supraclavicular lymph nodes: Not enlarged. Breasts: Examined in both sitting and lying position. Symmetrical without skin changes or masses. Abdomen: Soft no masses guarding or rebound. No hernias. Pelvic: External within normal limits. BUS within normal limits. Vaginal examination shows poor estrogen effect, no cystocele enterocele or rectocele. Cervix and uterus absent. Adnexa within normal limits. Rectovaginal confirmatory. Extremities within normal limits.  Assessment: #1. Atrophic vaginitis #2. Yeast vaginitis by history #3. Low bone mass  Plan: Terconazole 3 cream. Mammogram. Bone density at our office.

## 2011-11-21 ENCOUNTER — Other Ambulatory Visit: Payer: Self-pay | Admitting: Dermatology

## 2011-11-22 ENCOUNTER — Encounter: Payer: Self-pay | Admitting: Internal Medicine

## 2011-12-04 LAB — HM MAMMOGRAPHY

## 2011-12-10 ENCOUNTER — Ambulatory Visit
Admission: RE | Admit: 2011-12-10 | Discharge: 2011-12-10 | Disposition: A | Discharge status: Home / Self Care | Payer: 59 | Source: Ambulatory Visit | Attending: Obstetrics and Gynecology | Admitting: Obstetrics and Gynecology

## 2011-12-10 ENCOUNTER — Other Ambulatory Visit: Payer: Self-pay | Admitting: Obstetrics and Gynecology

## 2011-12-10 DIAGNOSIS — Z1231 Encounter for screening mammogram for malignant neoplasm of breast: Secondary | ICD-10-CM

## 2011-12-11 ENCOUNTER — Other Ambulatory Visit: Payer: Self-pay | Admitting: *Deleted

## 2011-12-11 DIAGNOSIS — N63 Unspecified lump in unspecified breast: Secondary | ICD-10-CM

## 2011-12-12 ENCOUNTER — Ambulatory Visit (INDEPENDENT_AMBULATORY_CARE_PROVIDER_SITE_OTHER): Payer: 59 | Admitting: *Deleted

## 2011-12-12 ENCOUNTER — Other Ambulatory Visit: Payer: Self-pay | Admitting: Cardiology

## 2011-12-12 DIAGNOSIS — I119 Hypertensive heart disease without heart failure: Secondary | ICD-10-CM

## 2011-12-12 LAB — BASIC METABOLIC PANEL
CO2: 29 mEq/L (ref 19–32)
Chloride: 97 mEq/L (ref 96–112)
Creatinine, Ser: 0.8 mg/dL (ref 0.4–1.2)

## 2011-12-12 LAB — HEPATIC FUNCTION PANEL
Albumin: 4.3 g/dL (ref 3.5–5.2)
Alkaline Phosphatase: 43 U/L (ref 39–117)
Bilirubin, Direct: 0.1 mg/dL (ref 0.0–0.3)
Total Bilirubin: 1.1 mg/dL (ref 0.3–1.2)

## 2011-12-12 LAB — LIPID PANEL
HDL: 66.8 mg/dL (ref >39.00)
Total CHOL/HDL Ratio: 3
Triglycerides: 63 mg/dL (ref 0.0–149.0)

## 2011-12-19 ENCOUNTER — Ambulatory Visit: Payer: 59 | Admitting: Cardiology

## 2011-12-21 ENCOUNTER — Ambulatory Visit
Admission: RE | Admit: 2011-12-21 | Discharge: 2011-12-21 | Disposition: A | Discharge status: Home / Self Care | Payer: 59 | Source: Ambulatory Visit | Attending: Gynecology | Admitting: Gynecology

## 2011-12-21 DIAGNOSIS — N63 Unspecified lump in unspecified breast: Secondary | ICD-10-CM

## 2011-12-24 ENCOUNTER — Telehealth: Payer: Self-pay | Admitting: *Deleted

## 2011-12-24 NOTE — Telephone Encounter (Signed)
Patient informed the information below.  She said she will get back to [place] with a decision.  Right now she is busy with her 65 y/o mother with a broke shoulder and getting her in rehab. She said that she has seen Dr. Jamey Ripa in the past and could call for an appt and let us know if she needs anything further.

## 2011-12-24 NOTE — Telephone Encounter (Signed)
Message copied by Libby Maw on Mon Dec 24, 2011  8:51 AM ------      Message from: Richardson Chiquito      Created: Mon Dec 24, 2011  8:39 AM      Regarding: FW: result       PLease call patient with the below information and make appt if necessary but not wait the 6 months as advised orginally. Dr Reece Agar did not feel that what Dr Lily Peer was advising was wrong but that since Dr Reece Agar had seen her he had additional thoughts. Sherrilyn Rist      ----- Message -----         From: Trellis Paganini, MD         Sent: 12/21/2011   4:14 PM           To: Janus Molder, CMA      Subject: RE: result                                               Tell patient that when I saw her her I found no lump. The radiologist also could not find a lump and imaging was normal. However if she sure that there is something there we should refer her to Dr. Jamey Ripa.      ----- Message -----         From: Janus Molder, CMA         Sent: 12/21/2011   3:00 PM           To: Trellis Paganini, MD, #      Subject: result                                                   Dr Eda Paschal, Mylan Lengyel saw this result note from Dr Glenetta Hew regarding pts most recent mammo. Dr Glenetta Hew must have signed the order and that's why the report went to him instead of you. He didn't seem to notice this and so he did a result note on it. When Azeez Dunker called the pt she told her she was a pts of yours and wanted to know what you thought. Can you respond to Omkar Stratmann about this? Thanks Sherrilyn Rist

## 2011-12-26 ENCOUNTER — Encounter: Payer: Self-pay | Admitting: Cardiology

## 2011-12-26 ENCOUNTER — Ambulatory Visit (INDEPENDENT_AMBULATORY_CARE_PROVIDER_SITE_OTHER): Payer: 59 | Admitting: Cardiology

## 2011-12-26 VITALS — BP 138/86 | HR 60 | Ht 64.0 in | Wt 122.0 lb

## 2011-12-26 DIAGNOSIS — K219 Gastro-esophageal reflux disease without esophagitis: Secondary | ICD-10-CM

## 2011-12-26 DIAGNOSIS — E785 Hyperlipidemia, unspecified: Secondary | ICD-10-CM

## 2011-12-26 DIAGNOSIS — M1990 Unspecified osteoarthritis, unspecified site: Secondary | ICD-10-CM

## 2011-12-26 DIAGNOSIS — R079 Chest pain, unspecified: Secondary | ICD-10-CM

## 2011-12-26 DIAGNOSIS — R002 Palpitations: Secondary | ICD-10-CM

## 2011-12-26 DIAGNOSIS — I119 Hypertensive heart disease without heart failure: Secondary | ICD-10-CM

## 2011-12-26 DIAGNOSIS — I10 Essential (primary) hypertension: Secondary | ICD-10-CM

## 2011-12-26 DIAGNOSIS — Z950 Presence of cardiac pacemaker: Secondary | ICD-10-CM

## 2011-12-26 NOTE — Progress Notes (Signed)
[name] Date of Birth:  June 30, 1946 [facility] [address] [place], [place]  [address] [phone]         Fax   [phone]  History of Present Illness: This pleasant middle-aged woman is seen for a scheduled followup office visit.  She has a past history of essential hypertension, sick sinus syndrome, and hypercholesterolemia.  She also has a history of anxiety and stress.  She also has problems with osteoarthritis of the right knee and finished a course of 3 injections into her right knee 3 months ago by her orthopedist Dr. Sherlean Foot.  She also has a history of gastroesophageal reflux disease and is on generic Axid.  Current Outpatient Prescriptions  Medication Sig Dispense Refill  . Acetaminophen (TYLENOL PO) Take by mouth.        . ATIVAN 1 MG tablet TAKE ONE TABLET BY MOUTH 4 TIMES DAILY  120 each  5  . hydrochlorothiazide 25 MG tablet Take 1 tablet (25 mg total) by mouth daily.  30 tablet  11  . nitroGLYCERIN (NITROSTAT) 0.4 MG SL tablet Place 1 tablet (0.4 mg total) under the tongue every 5 (five) minutes as needed. For up to 3 doses  25 tablet  3  . nizatidine (AXID) 150 MG capsule Take 150 mg by mouth 2 (two) times daily.          Allergies  Allergen Reactions  . Antihistamines, Loratadine-Type   . Bisoprolol-Hydrochlorothiazide   . Cephalexin   . Codeine   . Epinephrine   . Erythromycin     REACTION: nausea  . Keflex   . Lidocaine   . Lipitor (Atorvastatin Calcium)     aches  . Losartan Potassium   . Meperidine Hcl   . Morphine   . Paroxetine   . Penicillins   . Propranolol Hcl   . Sertraline Hcl   . Tetracycline   . Verapamil     Patient Active Problem List  Diagnoses  . HYPERLIPIDEMIA  . HYPERTENSION  . BRADYCARDIA  . HEMORRHOIDS  . GERD  . INGUINAL HERNIA  . SLEEP APNEA  . CHEST PAIN  . SKIN CANCER, HX OF  . COLONIC POLYPS, ADENOMATOUS, HX OF  . Internal hemorrhoids  . Rectal or anal pain  . History of colon  polyps  . Constipation  . Osteoarthritis of knee  . Biventricular cardiac pacemaker in situ  . Bradycardia  . Hypertension  . Pacemaker  . Endometriosis  . Fibroid    History  Smoking status  . Never Smoker   Smokeless tobacco  . Not on file    History  Alcohol Use No    Family History  Problem Relation Age of Onset  . Colon cancer Neg Hx   . Heart failure Sister   . Heart disease Sister   . Hypertension Sister   . Dementia Mother   . Hypertension Mother   . Hypertension Father   . Breast cancer Maternal Aunt   . Diabetes Maternal Aunt   . Diabetes Maternal Uncle     Review of Systems: Constitutional: no fever chills diaphoresis or fatigue or change in weight.  Head and neck: no hearing loss, no epistaxis, no photophobia or visual disturbance. Respiratory: No cough, shortness of breath or wheezing. Cardiovascular: No chest pain peripheral edema, palpitations. Gastrointestinal: No abdominal distention, no abdominal pain, no change in bowel habits hematochezia or melena. Genitourinary: No dysuria, no frequency, no urgency, no nocturia. Musculoskeletal:No arthralgias, no back pain, no  gait disturbance or myalgias. Neurological: No dizziness, no headaches, no numbness, no seizures, no syncope, no weakness, no tremors. Hematologic: No lymphadenopathy, no easy bruising. Psychiatric: No confusion, no hallucinations, no sleep disturbance.    Physical Exam: Filed Vitals:   12/26/11 1541  BP: 138/86  Pulse: 60   the general appearance reveals a well-developed well-nourished woman in no distress.The head and neck exam reveals pupils equal and reactive.  Extraocular movements are full.  There is no scleral icterus.  The mouth and pharynx are normal.  The neck is supple.  The carotids reveal no bruits.  The jugular venous pressure is normal.  The  thyroid is not enlarged.  There is no lymphadenopathy.  The chest is clear to percussion and auscultation.  There are no rales or  rhonchi.  Expansion of the chest is symmetrical.  The precordium is quiet.  The first heart sound is normal.  The second heart sound is physiologically split.  There is no murmur gallop rub or click.  There is no abnormal lift or heave.  The abdomen is soft and nontender.  The bowel sounds are normal.  The liver and spleen are not enlarged.  There are no abdominal masses.  There are no abdominal bruits.  Extremities reveal good pedal pulses.  There is no phlebitis or edema.  There is no cyanosis or clubbing.  Strength is normal and symmetrical in all extremities.  There is no lateralizing weakness.  There are no sensory deficits.  The skin is warm and dry.  There is no rash.     Assessment / Plan: Continue same medication.  She is now on the generic Ativan which she does not particularly like but the cost of the brand name Ativan became prohibitive and so she is resolving to using the generic. Recheck in 4 months for office visit and fasting lab work

## 2011-12-26 NOTE — Assessment & Plan Note (Signed)
The patient has a history of labile hypertension.  He is allergic to most medications her we have tried.  She was able to take hydrochlorothiazide 25 mg daily.  She has been under more stress recently because her elderly mother who was staying with her fell and broke her shoulder and is now in a nearby nursing home.  The mother also has severe dementia.

## 2011-12-26 NOTE — Assessment & Plan Note (Signed)
The patient has a history of hypercholesterolemia.  She is controlling this with careful diet.  She is not on statin therapy.  We reviewed her recent labs which were satisfactory

## 2011-12-26 NOTE — Patient Instructions (Signed)
Your physician recommends that you continue on your current medications as directed. Please refer to the Current Medication list given to you today. Your physician wants you to follow-up in: 4 months You will receive a reminder letter in the mail two months in advance. If you don't receive a letter, please call our office to schedule the follow-up appointment.  

## 2011-12-26 NOTE — Assessment & Plan Note (Signed)
No awareness of palpitations recently.  She checks her pacemaker about 3 times a year over the phone and sees Dr. Johney Frame once a year in the office

## 2011-12-26 NOTE — Assessment & Plan Note (Signed)
The patient has had very little chest discomfort recently despite the increased level of stress.  Her last nuclear stress test 05/17/09 showed no evidence of ischemia and her ejection fraction was 77% with no wall motion abnormalities.

## 2012-01-07 ENCOUNTER — Encounter: Payer: Self-pay | Admitting: Gastroenterology

## 2012-01-10 ENCOUNTER — Encounter: Payer: 59 | Admitting: *Deleted

## 2012-01-14 ENCOUNTER — Ambulatory Visit: Payer: 59 | Admitting: Internal Medicine

## 2012-01-14 ENCOUNTER — Encounter: Payer: Self-pay | Admitting: *Deleted

## 2012-01-15 ENCOUNTER — Encounter: Payer: Self-pay | Admitting: Family Medicine

## 2012-01-15 ENCOUNTER — Ambulatory Visit (INDEPENDENT_AMBULATORY_CARE_PROVIDER_SITE_OTHER): Payer: 59 | Admitting: Family Medicine

## 2012-01-15 ENCOUNTER — Telehealth: Payer: Self-pay | Admitting: Cardiology

## 2012-01-15 DIAGNOSIS — Z950 Presence of cardiac pacemaker: Secondary | ICD-10-CM

## 2012-01-15 DIAGNOSIS — N63 Unspecified lump in unspecified breast: Secondary | ICD-10-CM

## 2012-01-15 DIAGNOSIS — I10 Essential (primary) hypertension: Secondary | ICD-10-CM

## 2012-01-15 DIAGNOSIS — Z13820 Encounter for screening for osteoporosis: Secondary | ICD-10-CM

## 2012-01-15 DIAGNOSIS — Z86010 Personal history of colonic polyps: Secondary | ICD-10-CM

## 2012-01-15 DIAGNOSIS — F411 Generalized anxiety disorder: Secondary | ICD-10-CM

## 2012-01-15 DIAGNOSIS — F419 Anxiety disorder, unspecified: Secondary | ICD-10-CM

## 2012-01-15 NOTE — Patient Instructions (Addendum)
Check with your insurance to see if they will cover the shingles shot. I would get the pneumonia shot at 66.  Keep the appointment with Dr. Jamey Ripa Call about the visit with Dr. Russella Dar.   Recheck here in July 2013.  No labs ahead of time.

## 2012-01-15 NOTE — Telephone Encounter (Signed)
New Problem:      Patient's husband called in concerned that their pharmacy was filling his wife's prescription of hydrochlorothiazide 25 MG tablet incorrectly and just wanted some clarification on its strength. Please call back.

## 2012-01-15 NOTE — Telephone Encounter (Signed)
Confirmed pt's dose of HCTZ 25mg  daily.

## 2012-01-15 NOTE — Progress Notes (Signed)
Hypertension:    Using medication without problems: yes Occ lightheaded, during periods of fatigue/sleep deprivation due to caring for mother.  No pre/syncope.   Chest pain with exertion: no Edema:no Short of breath:no Average home BPs: not checked recently.    Anxiety, controlled with meds.  Related to caring for mother, who is ill. No ADE on prn use of meds.    Knee pain per ortho.   She'll call about colonoscopy.    Had f/u with Dr Jamey Ripa for breast mass.  Mammogram, no focal abnormality of the outer mid to lower left breast to correspond to patient's palpable abnormality.   Meds, vitals, and allergies reviewed.   PMH and SH reviewed  ROS: See HPI.  Otherwise negative.    GEN: nad, alert and oriented HEENT: mucous membranes moist NECK: supple w/o LA CV: rrr. Pacer noted on chest wall PULM: ctab, no inc wob ABD: soft, +bs EXT: no edema SKIN: no acute rash Affect wnl

## 2012-01-16 DIAGNOSIS — F419 Anxiety disorder, unspecified: Secondary | ICD-10-CM | POA: Insufficient documentation

## 2012-01-16 DIAGNOSIS — Z13820 Encounter for screening for osteoporosis: Secondary | ICD-10-CM | POA: Insufficient documentation

## 2012-01-16 DIAGNOSIS — N63 Unspecified lump in unspecified breast: Secondary | ICD-10-CM | POA: Insufficient documentation

## 2012-01-16 NOTE — Assessment & Plan Note (Signed)
Per gyn, d/w pt.  She'll consider later in 2013

## 2012-01-16 NOTE — Assessment & Plan Note (Addendum)
Related to caring for mother, who is ill.  Controlled with current meds.  No change. >30 min spent with face to face with patient in total, >50% counseling and/or coordinating care.

## 2012-01-16 NOTE — Assessment & Plan Note (Signed)
Per cards  

## 2012-01-16 NOTE — Assessment & Plan Note (Signed)
No change in meds.  Continue to work on diet.

## 2012-01-16 NOTE — Assessment & Plan Note (Signed)
Mammogram wnl, she has f/u with Dr. Jamey Ripa

## 2012-01-16 NOTE — Assessment & Plan Note (Signed)
She'll call about f/u with GI.

## 2012-01-18 ENCOUNTER — Ambulatory Visit (INDEPENDENT_AMBULATORY_CARE_PROVIDER_SITE_OTHER): Payer: 59 | Admitting: *Deleted

## 2012-01-18 ENCOUNTER — Encounter: Payer: Self-pay | Admitting: Internal Medicine

## 2012-01-18 DIAGNOSIS — Z950 Presence of cardiac pacemaker: Secondary | ICD-10-CM

## 2012-01-18 DIAGNOSIS — I498 Other specified cardiac arrhythmias: Secondary | ICD-10-CM

## 2012-01-18 DIAGNOSIS — R001 Bradycardia, unspecified: Secondary | ICD-10-CM

## 2012-01-18 LAB — REMOTE PACEMAKER DEVICE
ATRIAL PACING PM: 52
BAMS-0001: 175 {beats}/min
BATTERY VOLTAGE: 2.74 V
RV LEAD IMPEDENCE PM: 645 Ohm
RV LEAD THRESHOLD: 0.5 V
VENTRICULAR PACING PM: 0

## 2012-01-21 ENCOUNTER — Encounter: Payer: Self-pay | Admitting: *Deleted

## 2012-01-25 ENCOUNTER — Encounter (INDEPENDENT_AMBULATORY_CARE_PROVIDER_SITE_OTHER): Payer: Self-pay | Admitting: Surgery

## 2012-01-25 ENCOUNTER — Ambulatory Visit (INDEPENDENT_AMBULATORY_CARE_PROVIDER_SITE_OTHER): Payer: 59 | Admitting: Surgery

## 2012-01-25 ENCOUNTER — Telehealth: Payer: Self-pay | Admitting: Cardiology

## 2012-01-25 VITALS — BP 128/90 | HR 80 | Temp 98.8°F | Resp 16 | Ht 64.0 in | Wt 123.4 lb

## 2012-01-25 DIAGNOSIS — N6019 Diffuse cystic mastopathy of unspecified breast: Secondary | ICD-10-CM

## 2012-01-25 NOTE — Telephone Encounter (Signed)
Diastolic blood pressures are running too high.  She is allergic to most blood pressure meds.  We will increase her hydrochlorothiazide said that she takes 25 mg in the morning and 12.5 mg in the evening.

## 2012-01-25 NOTE — Progress Notes (Signed)
  CC: Possible mass left breast HPI: This patient was seen by her gynecologist in December and had a mammogram in January. At that time a firm mammogram she had noted a questionable area in the left breast just lateral to the areolar margin. She cannot always feel it. She had a history of cysts in the past and has a known cyst by ultrasound of the right breast.    PE General: The patient is alert oriented and healthy-appearing. Breasts: The rest are basically symmetric. They are very dense. There is no dominant mass. I don't really appreciate a mass the area that she's point out of which is in the 3:00 position areolar margin.  Data Reviewed I reviewed the mammogram reports as well as Dr.Gottsegan's office note  Assessment Fibrocystic disease  Plan I don't believe there is anything that required biopsy this time. She needs to continue routine mammograms and follow up with her primary physicians. I will see her p.r.n.

## 2012-01-25 NOTE — Telephone Encounter (Signed)
Advised patient

## 2012-01-25 NOTE — Telephone Encounter (Signed)
Concerned about her diastolic blood pressure being in the 90's last few checks.  Will forward to  Dr. Patty Sermons for review

## 2012-01-25 NOTE — Telephone Encounter (Signed)
New msg Pt was concerned about BP and she in under a lot stress. Please call

## 2012-01-25 NOTE — Progress Notes (Signed)
Pacer remote 

## 2012-02-04 ENCOUNTER — Other Ambulatory Visit: Payer: Self-pay | Admitting: *Deleted

## 2012-02-04 MED ORDER — HYDROCHLOROTHIAZIDE 25 MG PO TABS: 25.0000 mg | ORAL_TABLET | ORAL | Status: DC

## 2012-02-04 NOTE — Telephone Encounter (Signed)
Refilled hctz 

## 2012-02-20 ENCOUNTER — Other Ambulatory Visit: Payer: Self-pay | Admitting: Cardiology

## 2012-02-20 NOTE — Telephone Encounter (Signed)
Refilled axid

## 2012-04-01 ENCOUNTER — Ambulatory Visit (INDEPENDENT_AMBULATORY_CARE_PROVIDER_SITE_OTHER): Payer: 59 | Admitting: Family Medicine

## 2012-04-01 ENCOUNTER — Encounter: Payer: Self-pay | Admitting: Family Medicine

## 2012-04-01 VITALS — BP 122/80 | HR 88 | Temp 98.3°F | Wt 124.0 lb

## 2012-04-01 DIAGNOSIS — R350 Frequency of micturition: Secondary | ICD-10-CM

## 2012-04-01 DIAGNOSIS — IMO0001 Reserved for inherently not codable concepts without codable children: Secondary | ICD-10-CM

## 2012-04-01 DIAGNOSIS — R300 Dysuria: Secondary | ICD-10-CM

## 2012-04-01 LAB — POCT URINALYSIS DIPSTICK
Leukocytes, UA: NEGATIVE
Nitrite, UA: NEGATIVE
Protein, UA: NEGATIVE
Urobilinogen, UA: NEGATIVE

## 2012-04-01 NOTE — Progress Notes (Signed)
She has a history of urinary urgency, worse over last few weeks.  Dysuria: some burning at distal urethra duration of symptoms: a few weeks abdominal pain: no fevers:no back pain:no Vomiting:no She used terazol and had some help from that a few weeks ago.  She was having itching and dryness then, not as much burning.  No discharge now or then.   H/o vaginal dryness and irritation, chronic.  H/o mult UTIs and yeast infections.  She has incomplete urinary voiding now.    Meds, vitals, and allergies reviewed.   ROS: See HPI.  Otherwise negative.    GEN: nad, alert and oriented HEENT: mucous membranes moist NECK: supple CV: rrr.  ABD: soft, +bs, suprapubic area not tender EXT: no edema SKIN: no acute rash BACK: no CVA pain

## 2012-04-01 NOTE — Patient Instructions (Signed)
We'll contact you with your lab report. Drink plenty of fluids in the meantime.

## 2012-04-03 DIAGNOSIS — R300 Dysuria: Secondary | ICD-10-CM | POA: Insufficient documentation

## 2012-04-03 LAB — URINE CULTURE: Colony Count: NO GROWTH

## 2012-04-03 NOTE — Assessment & Plan Note (Signed)
Due to concern for pt comfort and lack of discharge, we elected to defer pelvic exam.  Would not tx now, await ucx.  She could have partially treated yeast causing irritation, but without discharge a WP is less likely to be useful.  Will await cx and notify pt.  Would like to hold abx if possible due to her history of vaginitis from candida.

## 2012-04-09 ENCOUNTER — Ambulatory Visit (INDEPENDENT_AMBULATORY_CARE_PROVIDER_SITE_OTHER): Payer: 59 | Admitting: Gastroenterology

## 2012-04-09 ENCOUNTER — Encounter: Payer: Self-pay | Admitting: Gastroenterology

## 2012-04-09 VITALS — BP 134/82 | HR 76 | Ht 64.5 in | Wt 124.8 lb

## 2012-04-09 DIAGNOSIS — Z860100 Personal history of colon polyps, unspecified: Secondary | ICD-10-CM

## 2012-04-09 DIAGNOSIS — R1319 Other dysphagia: Secondary | ICD-10-CM

## 2012-04-09 DIAGNOSIS — K219 Gastro-esophageal reflux disease without esophagitis: Secondary | ICD-10-CM

## 2012-04-09 MED ORDER — OMEPRAZOLE 20 MG PO CPDR: 20.0000 mg | DELAYED_RELEASE_CAPSULE | Freq: Every day | ORAL | Status: DC

## 2012-04-09 MED ORDER — MOVIPREP 100 G PO SOLR: 1.0000 | Freq: Once | ORAL | Status: DC

## 2012-04-09 NOTE — Progress Notes (Signed)
History of Present Illness: This is a 66 year old female with a history of adenomatous colon polyps initially diagnosed in January 2008. She relates frequent substernal burning that is not controlled by Axid twice daily. She occasionally has difficulty swallowing bread. She has had reflux problems for many years. She underwent upper endoscopy GERD and dysphagia in 2003 with no significant findings. She was treated with pantoprazole which led to bloating and Nexium that led to gagging and palpitations and so she returned to Axid. At various times over the years Axid has not adequately control her symptoms. Denies weight loss, abdominal pain, constipation, diarrhea, change in stool caliber, melena, hematochezia, nausea, vomiting, chest pain.  Review of Systems: Pertinent positive and negative review of systems were noted in the above HPI section. All other review of systems were otherwise negative.  Current Medications, Allergies, Past Medical History, Past Surgical History, Family History and Social History were reviewed in [employer] record.  Physical Exam: General: Well developed , well nourished, no acute distress Head: Normocephalic and atraumatic Eyes:  sclerae anicteric, EOMI Ears: Normal auditory acuity Mouth: No deformity or lesions Neck: Supple, no masses or thyromegaly Lungs: Clear throughout to auscultation Heart: Regular rate and rhythm; no murmurs, rubs or bruits Abdomen: Soft, non tender and non distended. No masses, hepatosplenomegaly or hernias noted. Normal Bowel sounds Rectal: Deferred to colonoscopy  Musculoskeletal: Symmetrical with no gross deformities  Skin: No lesions on visible extremities Pulses:  Normal pulses noted Extremities: No clubbing, cyanosis, edema or deformities noted Neurological: Alert oriented x 4, grossly nonfocal Cervical Nodes:  No significant cervical adenopathy Inguinal Nodes: No significant inguinal adenopathy Psychological:   Alert and cooperative. Normal mood and affect  Assessment and Recommendations:  1. Personal history of adenomatous colon polyps, initially diagnosed January 2008. Schedule surveillance colonoscopy. The risks, benefits, and alternatives to colonoscopy with possible biopsy and possible polypectomy were discussed with the patient and they consent to proceed.   2. Dysphagia and GERD. Intensify antireflux measures. Trial of omeprazole 20 mg daily in place of Axid. The risks, benefits, and alternatives to endoscopy with possible biopsy and possible dilation were discussed with the patient and they consent to proceed.

## 2012-04-09 NOTE — Patient Instructions (Signed)
You have been scheduled for an endoscopy and colonoscopy with propofol. Please follow the written instructions given to you at your visit today. Please pick up your prep at the pharmacy within the next 1-3 days. Stop taking your Axid and start Prilosec samples one tablet by mouth once daily. Once you have finished with the samples, you can purchase this over the counter.  cc: Crawford Givens, MD

## 2012-04-17 ENCOUNTER — Ambulatory Visit (INDEPENDENT_AMBULATORY_CARE_PROVIDER_SITE_OTHER): Payer: 59 | Admitting: *Deleted

## 2012-04-17 ENCOUNTER — Other Ambulatory Visit: Payer: Self-pay | Admitting: Cardiology

## 2012-04-17 ENCOUNTER — Encounter: Payer: Self-pay | Admitting: Internal Medicine

## 2012-04-17 DIAGNOSIS — R001 Bradycardia, unspecified: Secondary | ICD-10-CM

## 2012-04-17 DIAGNOSIS — I498 Other specified cardiac arrhythmias: Secondary | ICD-10-CM

## 2012-04-17 DIAGNOSIS — Z950 Presence of cardiac pacemaker: Secondary | ICD-10-CM

## 2012-04-17 MED ORDER — LORAZEPAM 1 MG PO TABS: 1.0000 mg | ORAL_TABLET | Freq: Four times a day (QID) | ORAL | Status: DC | PRN

## 2012-04-17 NOTE — Telephone Encounter (Signed)
Pt needs refill of ativan 1mg  at [facility] [address], please note this is a different pharmacy than on her med list, [facility]

## 2012-04-17 NOTE — Telephone Encounter (Signed)
Refilled lorazepam.

## 2012-04-25 LAB — REMOTE PACEMAKER DEVICE
AL AMPLITUDE: 2.8 mv
BAMS-0001: 175 {beats}/min
BATTERY VOLTAGE: 2.74 V
RV LEAD IMPEDENCE PM: 661 Ohm
VENTRICULAR PACING PM: 0

## 2012-04-29 ENCOUNTER — Other Ambulatory Visit (INDEPENDENT_AMBULATORY_CARE_PROVIDER_SITE_OTHER): Payer: 59

## 2012-04-29 DIAGNOSIS — I119 Hypertensive heart disease without heart failure: Secondary | ICD-10-CM

## 2012-04-29 LAB — BASIC METABOLIC PANEL
CO2: 30 mEq/L (ref 19–32)
Calcium: 8.9 mg/dL (ref 8.4–10.5)
GFR: 83.86 mL/min (ref >60.00)
Sodium: 133 mEq/L — ABNORMAL LOW (ref 135–145)

## 2012-04-29 LAB — HEPATIC FUNCTION PANEL
AST: 22 U/L (ref 0–37)
Albumin: 4.1 g/dL (ref 3.5–5.2)
Total Protein: 6.7 g/dL (ref 6.0–8.3)

## 2012-04-29 LAB — LIPID PANEL
Cholesterol: 198 mg/dL (ref 0–200)
HDL: 71.5 mg/dL (ref >39.00)
LDL Cholesterol: 112 mg/dL — ABNORMAL HIGH (ref 0–99)
Total CHOL/HDL Ratio: 3
Triglycerides: 75 mg/dL (ref 0.0–149.0)

## 2012-04-29 NOTE — Progress Notes (Signed)
Quick Note:  Please make copy of labs for patient visit. ______ 

## 2012-05-01 ENCOUNTER — Ambulatory Visit (INDEPENDENT_AMBULATORY_CARE_PROVIDER_SITE_OTHER): Payer: 59 | Admitting: Cardiology

## 2012-05-01 ENCOUNTER — Encounter: Payer: Self-pay | Admitting: Cardiology

## 2012-05-01 VITALS — BP 130/82 | HR 79 | Resp 18 | Ht 63.0 in | Wt 121.2 lb

## 2012-05-01 DIAGNOSIS — E785 Hyperlipidemia, unspecified: Secondary | ICD-10-CM

## 2012-05-01 DIAGNOSIS — I10 Essential (primary) hypertension: Secondary | ICD-10-CM

## 2012-05-01 DIAGNOSIS — R079 Chest pain, unspecified: Secondary | ICD-10-CM

## 2012-05-01 DIAGNOSIS — E7800 Pure hypercholesterolemia, unspecified: Secondary | ICD-10-CM

## 2012-05-01 NOTE — Assessment & Plan Note (Signed)
The patient has not been experiencing any sustained or prolonged chest pain.  Is not getting any regular exertional exercise.  She stays busy looking after her elderly mother who is in [facility]

## 2012-05-01 NOTE — Assessment & Plan Note (Signed)
The patient has a history of hypercholesterolemia.  He is intolerant of statin therapy.  She is controlling her in cholesterol with diet alone.

## 2012-05-01 NOTE — Assessment & Plan Note (Signed)
Blood pressure is remaining stable on current therapy.  She is not having any headaches or dizziness.

## 2012-05-01 NOTE — Progress Notes (Signed)
[name] Date of Birth:  31-Jul-1946 [facility] [address] [place], [place]  [address] [phone]         Fax   [phone]  History of Present Illness: This pleasant 66 year old woman is seen for a scheduled followup office visit.  He has a past history of essential hypertension and a history of symptomatic palpitations.  She also has a functioning dual-chamber pacemaker implanted by Dr. Reyes Ivan.  The patient has a history of atypical chest pain.  She does not have any history of known ischemic heart disease.  She does have a history of gastroesophageal reflux disease.  She is scheduled to have a upper endoscopy and colonoscopy at the end of June by her gastroenterologist Dr. Russella Dar.  The patient has been experiencing occasional palpitations  Current Outpatient Prescriptions  Medication Sig Dispense Refill  . Acetaminophen (TYLENOL PO) Take by mouth as needed.       . hydrochlorothiazide (HYDRODIURIL) 25 MG tablet Take 1 tablet (25 mg total) by mouth as directed. 1 in the morning and 1/2 in the evening  60 tablet  11  . LORazepam (ATIVAN) 1 MG tablet Take 1 tablet (1 mg total) by mouth every 6 (six) hours as needed.  120 tablet  3  . nitroGLYCERIN (NITROSTAT) 0.4 MG SL tablet Place 1 tablet (0.4 mg total) under the tongue every 5 (five) minutes as needed. For up to 3 doses  25 tablet  3  . MOVIPREP 100 G SOLR Take 1 kit (100 g total) by mouth once.  1 kit  0  . omeprazole (PRILOSEC) 20 MG capsule Take 1 capsule (20 mg total) by mouth daily.  14 capsule  0    Allergies  Allergen Reactions  . Antihistamines, Loratadine-Type   . Bisoprolol-Hydrochlorothiazide   . Bystolic (Nebivolol Hcl)   . Cephalexin   . Codeine   . Demerol (Meperidine)   . Epinephrine   . Erythromycin     REACTION: nausea  . Hyoscyamine Sulfate   . Inderal (Propranolol)   . Lidocaine   . Lipitor (Atorvastatin Calcium)     aches  . Lipitor (Atorvastatin)   . Losartan  Potassium   . Meperidine Hcl   . Morphine   . Paroxetine   . Paxil (Paroxetine Hcl)   . Penicillins   . Propranolol Hcl   . Sertraline Hcl   . Tetracycline   . Toprol Xl (Metoprolol Succinate)   . Verapamil   . Zoloft (Sertraline Hcl)     Patient Active Problem List  Diagnoses  . HYPERLIPIDEMIA  . HYPERTENSION  . HEMORRHOIDS  . GERD  . SLEEP APNEA  . CHEST PAIN  . SKIN CANCER, HX OF  . COLONIC POLYPS, ADENOMATOUS, HX OF  . Internal hemorrhoids  . Rectal or anal pain  . History of colon polyps  . Constipation  . Osteoarthritis of knee  . Biventricular cardiac pacemaker in situ  . Bradycardia  . Endometriosis  . Fibroid  . Osteoporosis screening  . Breast mass  . Anxiety  . Dysuria    History  Smoking status  . Never Smoker   Smokeless tobacco  . Never Used    History  Alcohol Use No    Family History  Problem Relation Age of Onset  . Colon cancer Neg Hx   . Heart failure Sister   . Heart disease Sister   . Hypertension Sister   . Dementia Mother   . Hypertension Mother   .  Hypertension Father   . Arthritis Father   . Breast cancer Maternal Aunt   . Diabetes Maternal Aunt   . Cancer Maternal Aunt     breast and ovarian  . Diabetes Maternal Uncle     Review of Systems: Constitutional: no fever chills diaphoresis or fatigue or change in weight.  Head and neck: no hearing loss, no epistaxis, no photophobia or visual disturbance. Respiratory: No cough, shortness of breath or wheezing. Cardiovascular: No chest pain peripheral edema, palpitations. Gastrointestinal: No abdominal distention, no abdominal pain, no change in bowel habits hematochezia or melena. Genitourinary: No dysuria, no frequency, no urgency, no nocturia. Musculoskeletal:No arthralgias, no back pain, no gait disturbance or myalgias. Neurological: No dizziness, no headaches, no numbness, no seizures, no syncope, no weakness, no tremors. Hematologic: No lymphadenopathy, no easy  bruising. Psychiatric: No confusion, no hallucinations, no sleep disturbance.    Physical Exam: Filed Vitals:   05/01/12 1115  BP: 130/82  Pulse: 79  Resp: 18   the general appearance reveals a somewhat anxious middle-aged woman in no distress.The head and neck exam reveals pupils equal and reactive.  Extraocular movements are full.  There is no scleral icterus.  The mouth and pharynx are normal.  The neck is supple.  The carotids reveal no bruits.  The jugular venous pressure is normal.  The  thyroid is not enlarged.  There is no lymphadenopathy.  The chest is clear to percussion and auscultation.  There are no rales or rhonchi.  Expansion of the chest is symmetrical.  The pacemaker pocket is in the left upper chest appears satisfactory.  The precordium is quiet.  The first heart sound is normal.  The second heart sound is physiologically split.  There is no murmur gallop rub or click.  There is no abnormal lift or heave.  The abdomen is soft and nontender.  The bowel sounds are normal.  The liver and spleen are not enlarged.  There are no abdominal masses.  There are no abdominal bruits.  Extremities reveal good pedal pulses.  There is no phlebitis or edema.  There is no cyanosis or clubbing.  Strength is normal and symmetrical in all extremities.  There is no lateralizing weakness.  There are no sensory deficits.  The skin is warm and dry.  There is no rash.     Assessment / Plan: We reviewed her meds.  Her potassium is low today.  She does not tolerate oral potassium.  He is able to take prunes which also helps for constipation.  She will increase her intake of prunes.  Continue other medicines the same and be rechecked in 4 months for followup office visit and fasting lab work. Will continue to be followed in the pacemaker clinic by Dr. Johney Frame.

## 2012-05-01 NOTE — Patient Instructions (Signed)
Your physician recommends that you continue on your current medications as directed. Please refer to the Current Medication list given to you today.  Your physician recommends that you schedule a follow-up appointment in: 4 months with fasting labs (lp/bmet/hfp)  

## 2012-05-12 ENCOUNTER — Telehealth: Payer: Self-pay | Admitting: Cardiology

## 2012-05-12 NOTE — Telephone Encounter (Signed)
Transmission was received. Spoke with pt and pt aware.

## 2012-05-12 NOTE — Telephone Encounter (Signed)
New msg Pt sent transmission on may 16 and she wanted to make sure you received it

## 2012-05-16 ENCOUNTER — Encounter: Payer: Self-pay | Admitting: *Deleted

## 2012-05-23 ENCOUNTER — Ambulatory Visit (AMBULATORY_SURGERY_CENTER): Payer: 59 | Admitting: Gastroenterology

## 2012-05-23 ENCOUNTER — Encounter: Payer: Self-pay | Admitting: Gastroenterology

## 2012-05-23 ENCOUNTER — Other Ambulatory Visit: Payer: Self-pay | Admitting: Gastroenterology

## 2012-05-23 VITALS — BP 144/84 | HR 65 | Temp 99.0°F | Resp 17 | Ht 64.5 in | Wt 124.0 lb

## 2012-05-23 DIAGNOSIS — K219 Gastro-esophageal reflux disease without esophagitis: Secondary | ICD-10-CM

## 2012-05-23 DIAGNOSIS — D126 Benign neoplasm of colon, unspecified: Secondary | ICD-10-CM

## 2012-05-23 DIAGNOSIS — R1319 Other dysphagia: Secondary | ICD-10-CM

## 2012-05-23 DIAGNOSIS — D131 Benign neoplasm of stomach: Secondary | ICD-10-CM

## 2012-05-23 DIAGNOSIS — Z86010 Personal history of colonic polyps: Secondary | ICD-10-CM

## 2012-05-23 DIAGNOSIS — K222 Esophageal obstruction: Secondary | ICD-10-CM

## 2012-05-23 DIAGNOSIS — Z1211 Encounter for screening for malignant neoplasm of colon: Secondary | ICD-10-CM

## 2012-05-23 MED ORDER — SODIUM CHLORIDE 0.9 % IV SOLN: 500.0000 mL | INTRAVENOUS | Status: DC

## 2012-05-23 NOTE — Patient Instructions (Addendum)
Hand outs were given to your care partner on esophageal dilatation diet, gastritis, GERD with hiatal hernia, polyps, hemorrhoids and high fiber diet.  Per Dr. Russella Dar continue acid reducing medication.  You may resume your prior medications today.  Please call if any questions or concerns.     YOU HAD AN ENDOSCOPIC PROCEDURE TODAY AT THE [facility]: Refer to the procedure report that was given to you for any specific questions about what was found during the examination.  If the procedure report does not answer your questions, please call your gastroenterologist to clarify.  If you requested that your care partner not be given the details of your procedure findings, then the procedure report has been included in a sealed envelope for you to review at your convenience later.  YOU SHOULD EXPECT: Some feelings of bloating in the abdomen. Passage of more gas than usual.  Walking can help get rid of the air that was put into your GI tract during the procedure and reduce the bloating. If you had a lower endoscopy (such as a colonoscopy or flexible sigmoidoscopy) you may notice spotting of blood in your stool or on the toilet paper. If you underwent a bowel prep for your procedure, then you may not have a normal bowel movement for a few days.  DIET:  Please follow the dilatation diet the rest of the day.  Drink plenty of fluids but you should avoid alcoholic beverages for 24 hours.  ACTIVITY: Your care partner should take you home directly after the procedure.  You should plan to take it easy, moving slowly for the rest of the day.  You can resume normal activity the day after the procedure however you should NOT DRIVE or use heavy machinery for 24 hours (because of the sedation medicines used during the test).    SYMPTOMS TO REPORT IMMEDIATELY: A gastroenterologist can be reached at any hour.  During normal business hours, 8:30 AM to 5:00 PM Monday through Friday, call [phone].  After  hours and on weekends, please call the GI answering service at [phone] who will take a message and have the physician on call contact you.   Following lower endoscopy (colonoscopy or flexible sigmoidoscopy):  Excessive amounts of blood in the stool  Significant tenderness or worsening of abdominal pains  Swelling of the abdomen that is new, acute  Fever of 100F or higher  Following upper endoscopy (EGD)  Vomiting of blood or coffee ground material  New chest pain or pain under the shoulder blades  Painful or persistently difficult swallowing  New shortness of breath  Fever of 100F or higher  Black, tarry-looking stools  FOLLOW UP: If any biopsies were taken you will be contacted by phone or by letter within the next 1-3 weeks.  Call your gastroenterologist if you have not heard about the biopsies in 3 weeks.  Our staff will call the home number listed on your records the next business day following your procedure to check on you and address any questions or concerns that you may have at that time regarding the information given to you following your procedure. This is a courtesy call and so if there is no answer at the home number and we have not heard from you through the emergency physician on call, we will assume that you have returned to your regular daily activities without incident.  SIGNATURES/CONFIDENTIALITY: You and/or your care partner have signed paperwork which will be entered into your electronic medical record.  These signatures attest to the fact that that the information above on your After Visit Summary has been reviewed and is understood.  Full responsibility of the confidentiality of this discharge information lies with you and/or your care-partner.  

## 2012-05-23 NOTE — Op Note (Signed)
[facility] [address] [place], [place]  [address]  COLONOSCOPY PROCEDURE REPORT PATIENT:  [name], [name]  MR#:  [id number] BIRTHDATE:  1945-12-23, 65 yrs. old  GENDER:  female ENDOSCOPIST:  Judie Petit T. Russella Dar, MD, [facility]  PROCEDURE DATE:  05/23/2012 PROCEDURE:  Colonoscopy with snare polypectomy ASA CLASS:  Class III INDICATIONS:  1) surveillance and high-risk screening  2) history of pre-cancerous (adenomatous) colon polyps: 12/2006 MEDICATIONS:   MAC sedation, administered by CRNA, propofol (Diprivan) 120 mg IV DESCRIPTION OF PROCEDURE:   After the risks benefits and alternatives of the procedure were thoroughly explained, informed consent was obtained.  Digital rectal exam was performed and revealed no abnormalities.   The LB [id number] endoscope was introduced through the anus and advanced to the cecum, which was identified by both the appendix and ileocecal valve, without limitations.  The quality of the prep was excellent, using MoviPrep.  The instrument was then slowly withdrawn as the colon was fully examined. <<PROCEDUREIMAGES>> FINDINGS:  A sessile polyp was found in the descending colon. It was 5 mm in size. Polyp was snared without cautery. Retrieval was successful.  A sessile polyp was found in the sigmoid colon. It was 6 mm in size. Polyp was snared without cautery. Retrieval was successful. Otherwise normal colonoscopy without other polyps, masses, vascular ectasias, or inflammatory changes. Retroflexed views in the rectum revealed internal hemorrhoids, small.  The time to cecum =  3  minutes. The scope was then withdrawn (time = 9.33  min) from the patient and the procedure completed.  COMPLICATIONS:  None  ENDOSCOPIC IMPRESSION: 1) 5 mm sessile polyp in the descending colon 2) 6 mm sessile polyp in the sigmoid colon 3) Internal hemorrhoids  RECOMMENDATIONS: 1) Await pathology results 2) Repeat Colonoscopy in 5 years.  [name] [name],  MD, [facility]  n. eSIGNED:   [name] Stephine [name] at 05/23/2012 10:34 AM  [place]iana Shore, [id number]

## 2012-05-23 NOTE — Progress Notes (Signed)
Pt to the rest room  Before discharge.  No problems noted. Maw

## 2012-05-23 NOTE — Progress Notes (Signed)
No complaints noted in the recovery room. Maw  Patient did not experience any of the following events: a burn prior to discharge; a fall within the facility; wrong site/side/patient/procedure/implant event; or a hospital transfer or hospital admission upon discharge from the facility. (G8907) Patient did not have preoperative order for IV antibiotic SSI prophylaxis. (G8918)  

## 2012-05-23 NOTE — Op Note (Signed)
[facility] [address] [place], [place]  [address]  ENDOSCOPY PROCEDURE REPORT PATIENT:  [name], [name]  MR#:  [id number] BIRTHDATE:  23-Oct-1946, 65 yrs. old  GENDER:  female ENDOSCOPIST:  Judie Petit T. Russella Dar, MD, [facility]  PROCEDURE DATE:  05/23/2012 PROCEDURE:  EGD with dilatation over guidewire, with biopsy ASA CLASS:  Class III INDICATIONS:  dysphagia, GERD MEDICATIONS:  MAC sedation, administered by CRNA, There was residual sedation effect present from prior procedure., propofol (Diprivan) 120 mg IV TOPICAL ANESTHETIC:  none DESCRIPTION OF PROCEDURE:   After the risks benefits and alternatives of the procedure were thoroughly explained, informed consent was obtained.  The LB-GIF Q180 [id number] endoscope was introduced through the mouth and advanced to the second portion of the duodenum, without limitations.  The instrument was slowly withdrawn as the mucosa was fully examined. <<PROCEDUREIMAGES>> A stricture was found at the gastroesophageal junction. It was circumferential and benign appearing. Otherwise a nornal esophagus. Savary / guidewire with 15 mm and 16 mm dilators. Minimal resistance and minimal heme noted with both dilators. There were multiple polyps identified. in the body of the stomach. They were 3 - 6 mm in size. Multiple biopsies were obtained and sent to pathology.  Mild gastritis was found in the total stomach. It was erythematous.  Otherwise normal stomach.  The duodenum was normal appearing. Retroflexed views revealed a hiatal hernia, 3 cm.  The scope was then withdrawn from the patient and the procedure completed.  COMPLICATIONS:  None  ENDOSCOPIC IMPRESSION: 1) Stricture at the gastroesophageal junction 2) 3 - 6 mm polyps, multiple in the body of the stomach 3) Mild gastritis 4) Small hiatal hernia  RECOMMENDATIONS: 1) Anti-reflux regimen long term 2) Await pathology results 3) continue PPI lont term 4) post dilation  instructions  Maryse Brierley T. Russella Dar, MD, [facility]  n. eSIGNED:   [name] Cloa Bushong at 05/23/2012 10:46 AM  Georgiana Shore, [id number]

## 2012-05-26 ENCOUNTER — Telehealth: Payer: Self-pay | Admitting: *Deleted

## 2012-05-26 NOTE — Telephone Encounter (Signed)
  Follow up Call-  Call back number 05/23/2012  Post procedure Call Back phone  # [phone]  Permission to leave phone message Yes     Patient questions:  Do you have a fever, pain , or abdominal swelling? yes Pain Score  2 *  Have you tolerated food without any problems? yes  Have you been able to return to your normal activities? yes  Do you have any questions about your discharge instructions: Diet   no Medications  no Follow up visit  no  Do you have questions or concerns about your Care? no  Actions: * If pain score is 4 or above: No action needed, pain <4. Pt. C/o intermittent ruq pain, suspects it is gas pains comes and goes and is 2 on pain scale . Also c/o some soreness in throat sometime when swallows . Throat not hurting now but when hurts it is a 2 on pain scale. Encouraged pt to drink warm fluids to try and expel gas and try eating soft foods only today and keep throat moist and to certainly call back if these discomforts do not improve or gets worse. Pt was fine with this .

## 2012-05-26 NOTE — Telephone Encounter (Signed)
  Follow up Call-  Call back number 05/23/2012  Post procedure Call Back phone  # [phone]  Permission to leave phone message Yes     Patient questions:  Do you have a fever, pain , or abdominal swelling? no Pain Score  2 *  Have you tolerated food without any problems? yes  Have you been able to return to your normal activities? yes  Do you have any questions about your discharge instructions: Diet   no Medications  no Follow up visit  no  Do you have questions or concerns about your Care? no  Action s: * If pain score is 4 or above: No action needed, pain <4.

## 2012-05-26 NOTE — Telephone Encounter (Signed)
Duplicate phone note from same day

## 2012-05-27 ENCOUNTER — Encounter: Payer: Self-pay | Admitting: Gastroenterology

## 2012-05-28 ENCOUNTER — Encounter: Payer: Self-pay | Admitting: Family Medicine

## 2012-05-28 DIAGNOSIS — K222 Esophageal obstruction: Secondary | ICD-10-CM | POA: Insufficient documentation

## 2012-05-28 DIAGNOSIS — K449 Diaphragmatic hernia without obstruction or gangrene: Secondary | ICD-10-CM | POA: Insufficient documentation

## 2012-06-04 ENCOUNTER — Ambulatory Visit (INDEPENDENT_AMBULATORY_CARE_PROVIDER_SITE_OTHER): Payer: 59 | Admitting: *Deleted

## 2012-06-04 DIAGNOSIS — Z2911 Encounter for prophylactic immunotherapy for respiratory syncytial virus (RSV): Secondary | ICD-10-CM

## 2012-06-04 DIAGNOSIS — Z23 Encounter for immunization: Secondary | ICD-10-CM

## 2012-07-17 ENCOUNTER — Other Ambulatory Visit: Payer: Self-pay | Admitting: Dermatology

## 2012-07-31 ENCOUNTER — Encounter: Payer: Self-pay | Admitting: Internal Medicine

## 2012-07-31 ENCOUNTER — Ambulatory Visit (INDEPENDENT_AMBULATORY_CARE_PROVIDER_SITE_OTHER): Payer: 59 | Admitting: *Deleted

## 2012-07-31 DIAGNOSIS — R001 Bradycardia, unspecified: Secondary | ICD-10-CM

## 2012-07-31 DIAGNOSIS — I498 Other specified cardiac arrhythmias: Secondary | ICD-10-CM

## 2012-07-31 DIAGNOSIS — Z950 Presence of cardiac pacemaker: Secondary | ICD-10-CM

## 2012-08-01 LAB — REMOTE PACEMAKER DEVICE
AL IMPEDENCE PM: 558 Ohm
AL THRESHOLD: 0.75 V
ATRIAL PACING PM: 53
RV LEAD AMPLITUDE: 22.4 mv
RV LEAD IMPEDENCE PM: 652 Ohm
RV LEAD THRESHOLD: 0.625 V

## 2012-08-05 ENCOUNTER — Encounter: Payer: Self-pay | Admitting: *Deleted

## 2012-08-21 ENCOUNTER — Ambulatory Visit (INDEPENDENT_AMBULATORY_CARE_PROVIDER_SITE_OTHER): Payer: 59

## 2012-08-21 ENCOUNTER — Encounter: Payer: Self-pay | Admitting: Internal Medicine

## 2012-08-21 DIAGNOSIS — Z23 Encounter for immunization: Secondary | ICD-10-CM

## 2012-09-01 ENCOUNTER — Other Ambulatory Visit (INDEPENDENT_AMBULATORY_CARE_PROVIDER_SITE_OTHER): Payer: 59

## 2012-09-01 DIAGNOSIS — E7800 Pure hypercholesterolemia, unspecified: Secondary | ICD-10-CM

## 2012-09-01 LAB — BASIC METABOLIC PANEL
BUN: 14 mg/dL (ref 6–23)
CO2: 29 mEq/L (ref 19–32)
Calcium: 8.9 mg/dL (ref 8.4–10.5)
Chloride: 93 mEq/L — ABNORMAL LOW (ref 96–112)
Creatinine, Ser: 0.8 mg/dL (ref 0.4–1.2)
Glucose, Bld: 93 mg/dL (ref 70–99)

## 2012-09-01 LAB — LIPID PANEL
Cholesterol: 226 mg/dL — ABNORMAL HIGH (ref 0–200)
Total CHOL/HDL Ratio: 3
VLDL: 12.8 mg/dL (ref 0.0–40.0)

## 2012-09-01 LAB — HEPATIC FUNCTION PANEL
AST: 22 U/L (ref 0–37)
Albumin: 4.2 g/dL (ref 3.5–5.2)
Alkaline Phosphatase: 41 U/L (ref 39–117)
Bilirubin, Direct: 0.1 mg/dL (ref 0.0–0.3)

## 2012-09-01 NOTE — Progress Notes (Signed)
Quick Note:  Please make copy of labs for patient visit. ______ 

## 2012-09-03 ENCOUNTER — Encounter: Payer: Self-pay | Admitting: Cardiology

## 2012-09-03 ENCOUNTER — Ambulatory Visit (INDEPENDENT_AMBULATORY_CARE_PROVIDER_SITE_OTHER): Payer: 59 | Admitting: Cardiology

## 2012-09-03 VITALS — BP 150/82 | HR 80 | Ht 64.5 in | Wt 123.0 lb

## 2012-09-03 DIAGNOSIS — I10 Essential (primary) hypertension: Secondary | ICD-10-CM

## 2012-09-03 DIAGNOSIS — E871 Hypo-osmolality and hyponatremia: Secondary | ICD-10-CM

## 2012-09-03 DIAGNOSIS — M1710 Unilateral primary osteoarthritis, unspecified knee: Secondary | ICD-10-CM

## 2012-09-03 DIAGNOSIS — E876 Hypokalemia: Secondary | ICD-10-CM

## 2012-09-03 DIAGNOSIS — E785 Hyperlipidemia, unspecified: Secondary | ICD-10-CM

## 2012-09-03 MED ORDER — NITROGLYCERIN 0.4 MG SL SUBL: 0.4000 mg | SUBLINGUAL_TABLET | SUBLINGUAL | Status: DC | PRN

## 2012-09-03 MED ORDER — FUROSEMIDE 20 MG PO TABS: 20.0000 mg | ORAL_TABLET | Freq: Every day | ORAL | Status: DC

## 2012-09-03 MED ORDER — POTASSIUM CHLORIDE CRYS ER 10 MEQ PO TBCR: 10.0000 meq | EXTENDED_RELEASE_TABLET | Freq: Two times a day (BID) | ORAL | Status: DC

## 2012-09-03 NOTE — Progress Notes (Signed)
[name] Date of Birth:  1946-08-27 [facility] [address] [place], [place]  [address] [phone]         Fax   [phone]  History of Present Illness: This pleasant 66 year old woman is seen for a scheduled followup office visit. He has a past history of essential hypertension and a history of symptomatic palpitations. She also has a functioning dual-chamber pacemaker implanted by Dr. Reyes Ivan. The patient has a history of atypical chest pain. She does not have any history of known ischemic heart disease. She does have a history of gastroesophageal reflux disease. She underwent upper endoscopy and colonoscopy at the end of June by her gastroenterologist Dr. Russella Dar.  A few colonic polyps were found and she will need another colonoscopy in 5 years. The patient has been experiencing occasional palpitations.  She has been under a lot of stress.  Her mother is still in a nursing home and the patient does every lunchtime and every evening to help feed her.  The patient's mother has severe dementia.   Current Outpatient Prescriptions  Medication Sig Dispense Refill  . Acetaminophen (TYLENOL PO) Take by mouth as needed.       Marland Kitchen LORazepam (ATIVAN) 1 MG tablet Take 1 tablet (1 mg total) by mouth every 6 (six) hours as needed.  120 tablet  3  . nitroGLYCERIN (NITROSTAT) 0.4 MG SL tablet Place 1 tablet (0.4 mg total) under the tongue every 5 (five) minutes as needed. For up to 3 doses  25 tablet  4  . omeprazole (PRILOSEC) 20 MG capsule Take 1 capsule (20 mg total) by mouth daily.  14 capsule  0  . DISCONTD: nitroGLYCERIN (NITROSTAT) 0.4 MG SL tablet Place 1 tablet (0.4 mg total) under the tongue every 5 (five) minutes as needed. For up to 3 doses  25 tablet  3  . furosemide (LASIX) 20 MG tablet Take 1 tablet (20 mg total) by mouth daily.  90 tablet  1  . potassium chloride (K-DUR,KLOR-CON) 10 MEQ tablet Take 1 tablet (10 mEq total) by mouth 2 (two) times daily.  180  tablet  1    Allergies  Allergen Reactions  . Antihistamines, Loratadine-Type   . Bisoprolol-Hydrochlorothiazide     Per pt: unknown  . Bystolic (Nebivolol Hcl)     Per pt: unknown  . Cephalexin Nausea And Vomiting  . Codeine     Increased heart rate, anxiety  . Demerol (Meperidine)     Increased heart rate, anxiety  . Epinephrine     Increased heart rate  . Erythromycin     REACTION: nausea  . Hyoscyamine Sulfate     Per pt: unknown  . Inderal (Propranolol)     Extreme tiredness  . Lidocaine     Family hx of malignant hyperthermia  . Lipitor (Atorvastatin Calcium)     aches  . Lipitor (Atorvastatin)     Muscle aches  . Losartan Potassium     Per pt: unknown  . Morphine     Nightmares, increased BP  . Paxil (Paroxetine Hcl)     Per pt: unknown  . Penicillins Nausea And Vomiting  . Propranolol Hcl     Per pt: unknown  . Sertraline Hcl     Per pt: unknown  . Tetracycline Nausea And Vomiting  . Toprol Xl (Metoprolol Succinate)     Extreme tiredness  . Verapamil     Per pt: unknown    Patient Active Problem List  Diagnosis  .  HYPERLIPIDEMIA  . HYPERTENSION  . HEMORRHOIDS  . GERD  . SLEEP APNEA  . CHEST PAIN  . SKIN CANCER, HX OF  . COLONIC POLYPS, ADENOMATOUS, HX OF  . Internal hemorrhoids  . Rectal or anal pain  . History of colon polyps  . Constipation  . Osteoarthritis of knee  . Biventricular cardiac pacemaker in situ  . Bradycardia  . Endometriosis  . Fibroid  . Osteoporosis screening  . Breast mass  . Anxiety  . Dysuria  . Hiatal hernia  . Esophageal stricture    History  Smoking status  . Never Smoker   Smokeless tobacco  . Never Used    History  Alcohol Use No    Family History  Problem Relation Age of Onset  . Colon cancer Neg Hx   . Heart failure Sister   . Heart disease Sister   . Hypertension Sister   . Dementia Mother   . Hypertension Mother   . Hypertension Father   . Arthritis Father   . Breast cancer Maternal  Aunt   . Diabetes Maternal Aunt   . Cancer Maternal Aunt     breast and ovarian  . Diabetes Maternal Uncle     Review of Systems: Constitutional: no fever chills diaphoresis or fatigue or change in weight.  Head and neck: no hearing loss, no epistaxis, no photophobia or visual disturbance. Respiratory: No cough, shortness of breath or wheezing. Cardiovascular: No chest pain peripheral edema, palpitations. Gastrointestinal: No abdominal distention, no abdominal pain, no change in bowel habits hematochezia or melena. Genitourinary: No dysuria, no frequency, no urgency, no nocturia. Musculoskeletal:No arthralgias, no back pain, no gait disturbance or myalgias. Neurological: No dizziness, no headaches, no numbness, no seizures, no syncope, no weakness, no tremors. Hematologic: No lymphadenopathy, no easy bruising. Psychiatric: No confusion, no hallucinations, no sleep disturbance.    Physical Exam: Filed Vitals:   09/03/12 1047  BP: 150/82  Pulse: 80   the general appearance reveals a well-developed well-nourished woman in no distress.  There is a pacemaker in the left upper chest.The head and neck exam reveals pupils equal and reactive.  Extraocular movements are full.  There is no scleral icterus.  The mouth and pharynx are normal.  The neck is supple.  The carotids reveal no bruits.  The jugular venous pressure is normal.  The  thyroid is not enlarged.  There is no lymphadenopathy.  The chest is clear to percussion and auscultation.  There are no rales or rhonchi.  Expansion of the chest is symmetrical.  The precordium is quiet.  The first heart sound is normal.  The second heart sound is physiologically split.  There is no murmur gallop rub or click.  There is no abnormal lift or heave.  The abdomen is soft and nontender.  The bowel sounds are normal.  The liver and spleen are not enlarged.  There are no abdominal masses.  There are no abdominal bruits.  Extremities reveal good pedal  pulses.  There is no phlebitis or edema.  There is no cyanosis or clubbing.  Strength is normal and symmetrical in all extremities.  There is no lateralizing weakness.  There are no sensory deficits.  The skin is warm and dry.  There is no rash.     Assessment / Plan: Stop hydrochlorothiazide because of hyponatremia and switched to furosemide 20 mg one daily.  Start potassium 10 mEq twice a day with food. When she returns next month for her office pacemaker check  she should also get a basal metabolic panel to check on sodium and potassium. Recheck in 4 months for followup office visit and get fasting lipid panel hepatic function and basal metabolic panel head of time.

## 2012-09-03 NOTE — Assessment & Plan Note (Signed)
Her blood pressure is running higher.  Her recent lab work shows hyponatremia with a sodium of 130 and hypokalemia with a potassium of 3.3 felt to be secondary to high dose hydrochlorothiazide.  We are stopping her HCTZ and starting furosemide 20 mg one daily and K. Dur 10 mEq twice a day.

## 2012-09-03 NOTE — Assessment & Plan Note (Signed)
The patient has been advised that she needs a knee replacement but she is delaying surgery because of her mother's illness

## 2012-09-03 NOTE — Patient Instructions (Signed)
Your physician recommends that you schedule a follow-up appointment in: 4 MONTHS   Your physician recommends that you return for lab work in: ON RETURN VISIT FOR YOUR PACER CHECK // NEED BMET  Your physician has recommended you make the following change in your medication:   STOP HCTZ START LASIX 20 MG DAILY IN MORNING START POTASSIUM/ KDUR 10 MEQ ONE TABLET TWICE A DAY WITH FOOD

## 2012-09-03 NOTE — Assessment & Plan Note (Signed)
The patient has a history of hyperlipidemia.  Her lab work is not is good because of lack of exercise because of her other time commitments and she also has a right knee which is giving her problems and she has had to have shots in her right knee from her orthopedist Dr. Sherlean Foot.

## 2012-09-22 ENCOUNTER — Other Ambulatory Visit: Payer: Self-pay | Admitting: Cardiology

## 2012-09-22 DIAGNOSIS — F419 Anxiety disorder, unspecified: Secondary | ICD-10-CM

## 2012-10-08 ENCOUNTER — Encounter: Payer: Self-pay | Admitting: *Deleted

## 2012-10-08 DIAGNOSIS — Z950 Presence of cardiac pacemaker: Secondary | ICD-10-CM | POA: Insufficient documentation

## 2012-10-13 ENCOUNTER — Encounter: Payer: Self-pay | Admitting: Internal Medicine

## 2012-10-13 ENCOUNTER — Ambulatory Visit (INDEPENDENT_AMBULATORY_CARE_PROVIDER_SITE_OTHER): Payer: 59 | Admitting: Internal Medicine

## 2012-10-13 ENCOUNTER — Other Ambulatory Visit (INDEPENDENT_AMBULATORY_CARE_PROVIDER_SITE_OTHER): Payer: 59

## 2012-10-13 VITALS — BP 136/87 | HR 86 | Ht 64.0 in | Wt 125.4 lb

## 2012-10-13 DIAGNOSIS — E871 Hypo-osmolality and hyponatremia: Secondary | ICD-10-CM

## 2012-10-13 DIAGNOSIS — R001 Bradycardia, unspecified: Secondary | ICD-10-CM

## 2012-10-13 DIAGNOSIS — E876 Hypokalemia: Secondary | ICD-10-CM

## 2012-10-13 DIAGNOSIS — I498 Other specified cardiac arrhythmias: Secondary | ICD-10-CM

## 2012-10-13 DIAGNOSIS — E785 Hyperlipidemia, unspecified: Secondary | ICD-10-CM

## 2012-10-13 DIAGNOSIS — Z950 Presence of cardiac pacemaker: Secondary | ICD-10-CM

## 2012-10-13 LAB — BASIC METABOLIC PANEL
Calcium: 8.8 mg/dL (ref 8.4–10.5)
Creatinine, Ser: 0.8 mg/dL (ref 0.4–1.2)

## 2012-10-13 LAB — PACEMAKER DEVICE OBSERVATION
AL AMPLITUDE: 5.6 mv
AL THRESHOLD: 0.75 V
RV LEAD IMPEDENCE PM: 634 Ohm
RV LEAD THRESHOLD: 0.75 V
VENTRICULAR PACING PM: 0

## 2012-10-13 NOTE — Patient Instructions (Addendum)
Remote monitoring is used to monitor your Pacemaker of ICD from home. This monitoring reduces the number of office visits required to check your device to one time per year. It allows [place] to keep an eye on the functioning of your device to ensure it is working properly. You are scheduled for a device check from home on 01/12/2013. You may send your transmission at any time that day. If you have a wireless device, the transmission will be sent automatically. After your physician reviews your transmission, you will receive a postcard with your next transmission date.     Your physician wants you to follow-up in: 1 year with Dr. Johney Frame. You will receive a reminder letter in the mail two months in advance. If you don't receive a letter, please call our office to schedule the follow-up appointment.

## 2012-10-16 ENCOUNTER — Telehealth: Payer: Self-pay | Admitting: Cardiology

## 2012-10-16 NOTE — Telephone Encounter (Signed)
F/u   Returning call back to nurse from Monday.

## 2012-10-16 NOTE — Telephone Encounter (Signed)
Message copied by Burnell Blanks on Thu Oct 16, 2012 11:25 AM ------      Message from: Cassell Clement      Created: Mon Oct 13, 2012  9:31 PM       BMET is improved.  CSD

## 2012-10-16 NOTE — Telephone Encounter (Signed)
Since starting Lasix and K+ has increase gas and loose stools at time (not diarrhea) but more frequent BM's with cramping. Patient denies any black/dark stools.  Also sometimes it will hit her when she has to urinate she has to go right away and unable to hold it till she gets to rest room.  Will forward to  Dr. Patty Sermons for review +

## 2012-10-16 NOTE — Telephone Encounter (Signed)
Decrease Lasix to 10 mg daily and decrease potassium to once a day and see if symptoms improve

## 2012-10-16 NOTE — Telephone Encounter (Signed)
Advised patient.  She will monitor blood pressure and let us know how she is doing

## 2012-10-24 NOTE — Progress Notes (Signed)
PCP: Crawford Givens, MD Primary Cardiologist:  Dr Renae Fickle Neviah Braud is a 66 y.o. female who presents today for routine electrophysiology followup.  Since last being seen in our clinic, the patient reports doing very well.  Today, she denies symptoms of palpitations, chest pain, shortness of breath,  lower extremity edema, dizziness, presyncope, or syncope.  The patient is otherwise without complaint today.   Past Medical History  Diagnosis Date  . Colon polyp     adenomatous  . Hemorrhoids   . Inguinal hernia   . Hyperlipidemia   . Sleep apnea     can't use c-pap  . Allergic rhinitis   . GERD (gastroesophageal reflux disease)   . Bradycardia     s/p MDT PPM  . Atypical chest pain   . Hypertension   . Pacemaker   . Endometriosis   . Fibroid   . Skin cancer     Basal and squamous cell  . Anemia   . Arthritis   . Blood in stool   . History of chicken pox   . Depression   . Cardiac arrhythmia due to congenital heart disease   . Heart murmur   . Thyroid disease   . UTI (urinary tract infection)    Past Surgical History  Procedure Date  . Thyroidectomy 1979    RIGHT SIDE  . Polypectomy   . Pacemaker insertion 2006    Dr. Reyes Ivan  . Abdominal hysterectomy   . Knee arthroscopy 2011  . Cesarean section   . Dilation and curettage of uterus   . Thumb surgery   . Oophorectomy 1995    LSO and RSO  . Tear duct surgery 2010  . Hand surgery 1992  . Inguinal hernia repair 02/12/1994    left inguinal hernia repair  . Hernia repair     Current Outpatient Prescriptions  Medication Sig Dispense Refill  . Acetaminophen (TYLENOL PO) Take by mouth as needed.       Marland Kitchen LORazepam (ATIVAN) 1 MG tablet TAKE 1 TABLET FOUR TIMES A DAY IF NEEDED  120 tablet  3  . nitroGLYCERIN (NITROSTAT) 0.4 MG SL tablet Place 1 tablet (0.4 mg total) under the tongue every 5 (five) minutes as needed. For up to 3 doses  25 tablet  4  . nizatidine (AXID) 150 MG capsule Take 150 mg by mouth 2  (two) times daily.      . furosemide (LASIX) 20 MG tablet Take 20 mg by mouth as directed. 1/2 tablet daily      . potassium chloride (K-DUR,KLOR-CON) 10 MEQ tablet Take 10 mEq by mouth daily.        Physical Exam: Filed Vitals:   10/13/12 1448  BP: 136/87  Pulse: 86  Height: 5\' 4"  (1.626 m)  Weight: 125 lb 6.4 oz (56.881 kg)    GEN- The patient is well appearing, alert and oriented x 3 today.   Head- normocephalic, atraumatic Eyes-  Sclera clear, conjunctiva pink Ears- hearing intact Oropharynx- clear Lungs- Clear to ausculation bilaterally, normal work of breathing Chest- pacemaker pocket is well healed Heart- Regular rate and rhythm, no murmurs, rubs or gallops, PMI not laterally displaced GI- soft, NT, ND, + BS Extremities- no clubbing, cyanosis, or edema  Pacemaker interrogation- reviewed in detail today,  See PACEART report  Assessment and Plan:  1. Bradycardia Normal pacemaker function See Pace Art report No changes today

## 2012-10-27 ENCOUNTER — Encounter: Payer: Self-pay | Admitting: Family Medicine

## 2012-10-27 ENCOUNTER — Telehealth: Payer: Self-pay | Admitting: Family Medicine

## 2012-10-27 ENCOUNTER — Ambulatory Visit (INDEPENDENT_AMBULATORY_CARE_PROVIDER_SITE_OTHER): Payer: 59 | Admitting: Family Medicine

## 2012-10-27 VITALS — BP 138/90 | HR 81 | Temp 98.2°F | Wt 126.8 lb

## 2012-10-27 DIAGNOSIS — J069 Acute upper respiratory infection, unspecified: Secondary | ICD-10-CM

## 2012-10-27 MED ORDER — BENZONATATE 200 MG PO CAPS: 200.0000 mg | ORAL_CAPSULE | Freq: Three times a day (TID) | ORAL | Status: AC | PRN

## 2012-10-27 NOTE — Assessment & Plan Note (Signed)
Likely viral, nontoxic, can use tussin and then tessalon prn. Rest and fluids, f/u prn.  Reassured.  She agrees.

## 2012-10-27 NOTE — Telephone Encounter (Signed)
[facility] Triage Call Report Triage Record Num: [id number] Operator: April Finney Patient Name: [name] Call Date & Time: 10/26/2012 3:16:21PM Patient Phone: [phone] PCP: Crawford Givens Patient Gender: Female PCP Fax : Patient DOB: 01/29/1946 [facility] Name[facility] Reason for Call: Caller: Chuck/Spouse; PCP: Crawford Givens [facility]) [facility] CB#: [phone]; Call regarding Cough/Congestion;Wants to know if we can call in antibiotic for congested cough. Instructed she would need to be seen since urgent cares are open. Denies any further needs. Protocol(s) Used: Office Note Recommended Outcome per Protocol: Information Noted and Sent to Office Reason for Outcome: Caller information to office Care Advice: ~ 10/26/2012 3:23:06PM Page 1 of 1 [email or web address]

## 2012-10-27 NOTE — Progress Notes (Signed)
duration of symptoms: about 1 week, progressively worse over the last few days.   Rhinorrhea: anterior only with coughing episodes; does have some post nasal gtt congestion:yes ear pain:no sore throat:yes, scratchy Cough:yes, some sputum, fits of coughing.   myalgias:no other concerns: Voice is hoarse.   No fevers.   Sputum is clear now.  Had prev been rarely discolored.   Cough is worse during the day.    ROS: See HPI.  Otherwise negative.    Meds, vitals, and allergies reviewed.   GEN: nad, alert and oriented HEENT: mucous membranes moist, TM w/o erythema, nasal epithelium injected, OP with cobblestoning NECK: supple w/o LA, mild UAN noise noted CV: rrr. PULM: ctab, no inc wob, no focal dec in BS ABD: soft, +bs EXT: no edema

## 2012-10-27 NOTE — Patient Instructions (Addendum)
Take the tessalon if the tussin doesn't work.  Drink plenty of fluids and gargle with warm salt water for your throat.  This should gradually improve.  Take care.  Let us know if you have other concerns.

## 2012-11-06 ENCOUNTER — Encounter: Payer: Self-pay | Admitting: Obstetrics and Gynecology

## 2012-11-06 ENCOUNTER — Ambulatory Visit (INDEPENDENT_AMBULATORY_CARE_PROVIDER_SITE_OTHER): Payer: Medicare Other | Admitting: Obstetrics and Gynecology

## 2012-11-06 ENCOUNTER — Other Ambulatory Visit: Payer: Self-pay | Admitting: Obstetrics and Gynecology

## 2012-11-06 VITALS — BP 124/76 | Ht 65.0 in | Wt 124.0 lb

## 2012-11-06 DIAGNOSIS — N952 Postmenopausal atrophic vaginitis: Secondary | ICD-10-CM

## 2012-11-06 DIAGNOSIS — M8580 Other specified disorders of bone density and structure, unspecified site: Secondary | ICD-10-CM

## 2012-11-06 DIAGNOSIS — R3915 Urgency of urination: Secondary | ICD-10-CM

## 2012-11-06 DIAGNOSIS — M899 Disorder of bone, unspecified: Secondary | ICD-10-CM

## 2012-11-06 DIAGNOSIS — Z1231 Encounter for screening mammogram for malignant neoplasm of breast: Secondary | ICD-10-CM

## 2012-11-06 DIAGNOSIS — IMO0002 Reserved for concepts with insufficient information to code with codable children: Secondary | ICD-10-CM

## 2012-11-06 NOTE — Patient Instructions (Signed)
schedule bone density.

## 2012-11-06 NOTE — Progress Notes (Signed)
Patient came to see me today for further followup. In 1995 she had a left salpingo-oophorectomy for endometriosis. In 1999 she had a total abdominal hysterectomy right salpingo-oophorectomy for endometriosis and fibroids. She is having no vaginal bleeding. She is having no pelvic pain. She has always had normal Pap smears. She had her last Pap smear in 2012. Her biggest gynecological problem recently has been atrophic vaginitis with dyspareunia. We have tried a variety of estrogen products vaginally but in spite of the low absorption  she has had side effects with all them. Most of them have been Heart palpitations.She sees Dr. Patty Sermons  for hyperlipidemia, bradycardia. She has a pacemaker. She has scheduled her yearly mammogram. She has a history of osteopenia and is due for followup bone density. Her last bone density was 2007. She has had no fractures. She does get urinary urgency without incontinence or dysuria.  ROS: 12 system review done. Pertinent positives above. Other positives include GERD, Hemorrhoids And anxiety.  HEENT: Within normal limits.[name] present. Neck: No masses. Supraclavicular lymph nodes: Not enlarged. Breasts: Examined in both sitting and lying position. Symmetrical without skin changes or masses. Abdomen: Soft no masses guarding or rebound. No hernias. Pelvic: External within normal limits. BUS within normal limits. Vaginal examination shows fair  estrogen effect, no cystocele enterocele or rectocele. Cervix and uterus absent. Adnexa within normal limits. Rectovaginal confirmatory. Extremities within normal limits.  Assessment: #1. Atrophic vaginitis with dyspareunia-sensitive to vaginal estrogen #2. Endometriosis #3. Urinary urgency #4. Osteopenia  Plan: She will let me know if she needs medication for the urgency. She will get mammogram. She will schedule  bone density. Pap not done.The new Pap smear guidelines were discussed with the patient. Pt to try Hyalo gyn  gel.

## 2012-11-21 ENCOUNTER — Telehealth: Payer: Self-pay | Admitting: Cardiology

## 2012-11-21 NOTE — Telephone Encounter (Signed)
Pt states she has been having dizzy spells when standing up or even walking around, pt concerned but declines an app at this time. Told her to get bp/p when she feels this way and note the time to see if her BP/P are dropping, told her to call back 12/23or 12/24 with readings. Told pt I would forward a note to Dr/nurse to update them, pt agreed to plan.

## 2012-11-21 NOTE — Telephone Encounter (Signed)
Agree with advice given

## 2012-11-21 NOTE — Telephone Encounter (Signed)
New Problem:    Patient called in because occasionally when she straightens to stand from a bent over position she is experiencing SOB.  Please call back.

## 2012-11-24 ENCOUNTER — Telehealth: Payer: Self-pay | Admitting: Cardiology

## 2012-11-24 NOTE — Telephone Encounter (Signed)
Advised patient

## 2012-11-24 NOTE — Telephone Encounter (Signed)
plz return call to pt [phone] or [phone] regarding Bp

## 2012-11-24 NOTE — Telephone Encounter (Signed)
Would increase Lasix to 10 mg one day alternating with 20 mg the next.

## 2012-11-24 NOTE — Telephone Encounter (Signed)
Patient taking Lasix 10 mg daily blood pressure Friday 5:15 pm 154/100 and an hour later 125/92  Saturday around 9:30 pm 145/77 HR 76 Yesterday no shortness of breath  This am blood pressure 152/84 before morning medications and was having some shortness but couldn't tell if coming from her irregular heart beats she has at times.  Patient is having the shortness of breath more noticeable when she bends over but not so much with exertion.  Was having this at last ov and sodium and potassium low (HCTZ changed to Lasix)  Did feel better when she first started Lasix, but even with decreasing not any better.    Will forward to  Dr. Patty Sermons for review

## 2012-12-03 DIAGNOSIS — M8580 Other specified disorders of bone density and structure, unspecified site: Secondary | ICD-10-CM

## 2012-12-03 HISTORY — DX: Other specified disorders of bone density and structure, unspecified site: M85.80

## 2012-12-15 ENCOUNTER — Other Ambulatory Visit: Payer: Self-pay | Admitting: Gynecology

## 2012-12-15 DIAGNOSIS — M8580 Other specified disorders of bone density and structure, unspecified site: Secondary | ICD-10-CM

## 2012-12-22 ENCOUNTER — Other Ambulatory Visit: Payer: Self-pay | Admitting: Gynecology

## 2012-12-22 ENCOUNTER — Ambulatory Visit
Admission: RE | Admit: 2012-12-22 | Discharge: 2012-12-22 | Disposition: A | Discharge status: Home / Self Care | Payer: Medicare Other | Source: Ambulatory Visit | Attending: Obstetrics and Gynecology | Admitting: Obstetrics and Gynecology

## 2012-12-22 DIAGNOSIS — Z1231 Encounter for screening mammogram for malignant neoplasm of breast: Secondary | ICD-10-CM

## 2012-12-25 ENCOUNTER — Ambulatory Visit (INDEPENDENT_AMBULATORY_CARE_PROVIDER_SITE_OTHER): Payer: Medicare Other

## 2012-12-25 DIAGNOSIS — M899 Disorder of bone, unspecified: Secondary | ICD-10-CM

## 2012-12-25 DIAGNOSIS — M8580 Other specified disorders of bone density and structure, unspecified site: Secondary | ICD-10-CM

## 2012-12-26 ENCOUNTER — Encounter: Payer: Self-pay | Admitting: Gynecology

## 2013-01-05 ENCOUNTER — Other Ambulatory Visit (INDEPENDENT_AMBULATORY_CARE_PROVIDER_SITE_OTHER): Payer: Medicare Other

## 2013-01-05 DIAGNOSIS — E785 Hyperlipidemia, unspecified: Secondary | ICD-10-CM

## 2013-01-05 DIAGNOSIS — E876 Hypokalemia: Secondary | ICD-10-CM

## 2013-01-05 DIAGNOSIS — E871 Hypo-osmolality and hyponatremia: Secondary | ICD-10-CM

## 2013-01-05 LAB — BASIC METABOLIC PANEL
BUN: 17 mg/dL (ref 6–23)
Calcium: 8.9 mg/dL (ref 8.4–10.5)
GFR: 72.3 mL/min (ref >60.00)
Glucose, Bld: 91 mg/dL (ref 70–99)
Sodium: 139 mEq/L (ref 135–145)

## 2013-01-05 LAB — LDL CHOLESTEROL, DIRECT: Direct LDL: 119.9 mg/dL

## 2013-01-05 LAB — LIPID PANEL: Cholesterol: 208 mg/dL — ABNORMAL HIGH (ref 0–200)

## 2013-01-05 LAB — HEPATIC FUNCTION PANEL
ALT: 17 U/L (ref 0–35)
AST: 21 U/L (ref 0–37)
Albumin: 4.2 g/dL (ref 3.5–5.2)
Alkaline Phosphatase: 44 U/L (ref 39–117)
Bilirubin, Direct: 0 mg/dL (ref 0.0–0.3)
Total Protein: 7 g/dL (ref 6.0–8.3)

## 2013-01-05 NOTE — Progress Notes (Signed)
Quick Note:  Please make copy of labs for patient visit. ______ 

## 2013-01-07 ENCOUNTER — Ambulatory Visit (INDEPENDENT_AMBULATORY_CARE_PROVIDER_SITE_OTHER): Payer: Medicare Other | Admitting: Cardiology

## 2013-01-07 ENCOUNTER — Encounter: Payer: Self-pay | Admitting: Cardiology

## 2013-01-07 VITALS — BP 151/92 | HR 82 | Ht 64.5 in | Wt 123.0 lb

## 2013-01-07 DIAGNOSIS — E785 Hyperlipidemia, unspecified: Secondary | ICD-10-CM

## 2013-01-07 DIAGNOSIS — I119 Hypertensive heart disease without heart failure: Secondary | ICD-10-CM

## 2013-01-07 DIAGNOSIS — F411 Generalized anxiety disorder: Secondary | ICD-10-CM

## 2013-01-07 DIAGNOSIS — F419 Anxiety disorder, unspecified: Secondary | ICD-10-CM

## 2013-01-07 MED ORDER — DILTIAZEM HCL ER COATED BEADS 120 MG PO CP24: 120.0000 mg | ORAL_CAPSULE | Freq: Every day | ORAL | Status: DC

## 2013-01-07 NOTE — Assessment & Plan Note (Signed)
The patient has a history of hyperlipidemia.  She is intolerant to blood cholesterol lowering medication and is treating her hyperlipidemia with careful diet

## 2013-01-07 NOTE — Progress Notes (Signed)
[name] Date of Birth:  08-12-1946 [facility] [address] [place], [place]  [address] [phone]         Fax   [phone]  History of Present Illness: This pleasant 67 year old woman is seen for a scheduled followup office visit. He has a past history of essential hypertension and a history of symptomatic palpitations. She also has a functioning dual-chamber pacemaker implanted by Dr. Reyes Ivan. The patient has a history of atypical chest pain. She does not have any history of known ischemic heart disease. She does have a history of gastroesophageal reflux disease. She underwent upper endoscopy and colonoscopy at the end of June by her gastroenterologist Dr. Russella Dar. A few colonic polyps were found and she will need another colonoscopy in 5 years. The patient has been experiencing occasional palpitations.   Current Outpatient Prescriptions  Medication Sig Dispense Refill  . Acetaminophen (TYLENOL PO) Take by mouth as needed.       . furosemide (LASIX) 20 MG tablet Take 20 mg by mouth as directed. 1/2 tablet every other day      . LORazepam (ATIVAN) 1 MG tablet TAKE 1 TABLET FOUR TIMES A DAY IF NEEDED  120 tablet  3  . nitroGLYCERIN (NITROSTAT) 0.4 MG SL tablet Place 1 tablet (0.4 mg total) under the tongue every 5 (five) minutes as needed. For up to 3 doses  25 tablet  4  . nizatidine (AXID) 150 MG capsule Take 150 mg by mouth 2 (two) times daily.      Marland Kitchen diltiazem (CARDIZEM CD) 120 MG 24 hr capsule Take 1 capsule (120 mg total) by mouth daily.  30 capsule  5    Allergies  Allergen Reactions  . Antihistamines, Loratadine-Type   . Bisoprolol-Hydrochlorothiazide     Per pt: unknown  . Bystolic (Nebivolol Hcl)     Per pt: unknown  . Cephalexin Nausea And Vomiting  . Codeine     Increased heart rate, anxiety  . Demerol (Meperidine)     Increased heart rate, anxiety  . Epinephrine     Increased heart rate  . Erythromycin     REACTION: nausea  .  Hyoscyamine Sulfate     Per pt: unknown  . Inderal (Propranolol)     Extreme tiredness  . Lidocaine     Family hx of malignant hyperthermia  . Lipitor (Atorvastatin Calcium)     aches  . Lipitor (Atorvastatin)     Muscle aches  . Losartan Potassium     Per pt: unknown  . Morphine     Nightmares, increased BP  . Paxil (Paroxetine Hcl)     Per pt: unknown  . Penicillins Nausea And Vomiting  . Propranolol Hcl     Per pt: unknown  . Sertraline Hcl     Per pt: unknown  . Tetracycline Nausea And Vomiting  . Toprol Xl (Metoprolol Succinate)     Extreme tiredness  . Verapamil     Per pt: unknown    Patient Active Problem List  Diagnosis  . HYPERLIPIDEMIA  . HYPERTENSION  . HEMORRHOIDS  . GERD  . SLEEP APNEA  . CHEST PAIN  . SKIN CANCER, HX OF  . COLONIC POLYPS, ADENOMATOUS, HX OF  . Internal hemorrhoids  . Rectal or anal pain  . History of colon polyps  . Constipation  . Osteoarthritis of knee  . Bradycardia  . Endometriosis  . Fibroid  . Osteoporosis screening  . Breast mass  . Anxiety  .  Dysuria  . Hiatal hernia  . Esophageal stricture  . Pacemaker-Medtronic  . URI (upper respiratory infection)    History  Smoking status  . Never Smoker   Smokeless tobacco  . Never Used    History  Alcohol Use No    Family History  Problem Relation Age of Onset  . Colon cancer Neg Hx   . Heart failure Sister   . Heart disease Sister   . Hypertension Sister   . Dementia Mother   . Hypertension Mother   . Hypertension Father   . Arthritis Father   . Breast cancer Maternal Aunt     Age 73's  . Diabetes Maternal Aunt   . Cancer Maternal Aunt     breast and ovarian  . Diabetes Maternal Uncle     Review of Systems: Constitutional: no fever chills diaphoresis or fatigue or change in weight.  Head and neck: no hearing loss, no epistaxis, no photophobia or visual disturbance. Respiratory: No cough, shortness of breath or wheezing. Cardiovascular: No chest pain  peripheral edema, palpitations. Gastrointestinal: No abdominal distention, no abdominal pain, no change in bowel habits hematochezia or melena. Genitourinary: No dysuria, no frequency, no urgency, no nocturia. Musculoskeletal:No arthralgias, no back pain, no gait disturbance or myalgias. Neurological: No dizziness, no headaches, no numbness, no seizures, no syncope, no weakness, no tremors. Hematologic: No lymphadenopathy, no easy bruising. Psychiatric: No confusion, no hallucinations, no sleep disturbance.    Physical Exam: Filed Vitals:   01/07/13 1029  BP: 151/92  Pulse: 82   the general appearance reveals a well-developed well-nourished middle-aged woman in no distress.The head and neck exam reveals pupils equal and reactive.  Extraocular movements are full.  There is no scleral icterus.  The mouth and pharynx are normal.  The neck is supple.  The carotids reveal no bruits.  The jugular venous pressure is normal.  The  thyroid is not enlarged.  There is no lymphadenopathy.  The chest is clear to percussion and auscultation.  There are no rales or rhonchi.  Expansion of the chest is symmetrical.  The precordium is quiet.  The first heart sound is normal.  The second heart sound is physiologically split.  There is no murmur gallop rub or click.  There is no abnormal lift or heave.  The abdomen is soft and nontender.  The bowel sounds are normal.  The liver and spleen are not enlarged.  There are no abdominal masses.  There are no abdominal bruits.  Extremities reveal good pedal pulses.  There is no phlebitis or edema.  There is no cyanosis or clubbing.  Strength is normal and symmetrical in all extremities.  There is no lateralizing weakness.  There are no sensory deficits.  The skin is warm and dry.  There is no rash.     Assessment / Plan: Decrease furosemide to 10 mg every other day and see if hair loss decreases.  add diltiazem CD 120 mg one daily for blood pressure control.  Her husband  is on diltiazem. Recheck in 4 months for followup office visit lipid panel and hepatic function panel

## 2013-01-07 NOTE — Patient Instructions (Addendum)
Decrease your Lasix (furosemide) to 20 mg 1/2 tablet every other day  Start Diltiazem CD 120 mg daily   Stop your Potassium  Your physician recommends that you schedule a follow-up appointment in: 4 months with fasting labs (lp/bmet/hfp)

## 2013-01-07 NOTE — Assessment & Plan Note (Signed)
Patient has a history of chronic anxiety and is on long-term generic Ativan with improvement

## 2013-01-07 NOTE — Assessment & Plan Note (Signed)
The patient has a history of high blood pressure.  She is allergic or intolerant to most of the medicines and we have tried it.  Her blood pressure is still running high.  She has also noticed increased hair loss since we increased the dose of her furosemide.  The patient has been experiencing episodes of shortness of breath at rest.  If she checks her blood pressure when she has these episodes her blood pressure is usually high in the range of 156/98 last evening.  We are going to and diltiazem CD 120 mg once a day and at the same time we will cut way back on her furosemide to just 10 mg every other day and she will no longer have to take potassium supplementation

## 2013-01-09 ENCOUNTER — Ambulatory Visit (INDEPENDENT_AMBULATORY_CARE_PROVIDER_SITE_OTHER): Payer: Medicare Other | Admitting: Gynecology

## 2013-01-09 ENCOUNTER — Encounter: Payer: Self-pay | Admitting: Gynecology

## 2013-01-09 DIAGNOSIS — N644 Mastodynia: Secondary | ICD-10-CM

## 2013-01-09 NOTE — Progress Notes (Signed)
Patient presents having had her mammogram 12/22/2012 which was normal.  Minutes afterwards she's had discomfort in the upper outer portion of her left breast along the anterior axillary line/edge of the pectoralis muscle. Waxes and wanes in discomfort. She called the mammogram facility and they told her that she needed to be seen by her physician.  Exam was kim assistant Both breasts examined lying and sitting without masses retractions discharge adenopathy. Pacemaker located in the region of her discomfort.  Assessment and plan: Left chest discomfort following mammogram and region of her pacemaker. I suspect that this is from compression and that it will resolve on its own. I recommended nonsteroidal anti-inflammatory and light heat to the area avoiding the pacemaker. If her discomfort persists she knows to call me. Assuming it resolves she'll follow up routinely.

## 2013-01-09 NOTE — Patient Instructions (Signed)
Take nonsteroidal anti-inflammatory medication of the next several days. Apply light heat to the area of discomfort avoiding your pacemaker.  Follow up if your discomfort persists.

## 2013-01-12 ENCOUNTER — Other Ambulatory Visit: Payer: Self-pay | Admitting: Internal Medicine

## 2013-01-12 ENCOUNTER — Ambulatory Visit (INDEPENDENT_AMBULATORY_CARE_PROVIDER_SITE_OTHER): Payer: Medicare Other | Admitting: *Deleted

## 2013-01-12 DIAGNOSIS — Z950 Presence of cardiac pacemaker: Secondary | ICD-10-CM

## 2013-01-12 DIAGNOSIS — R001 Bradycardia, unspecified: Secondary | ICD-10-CM

## 2013-01-12 DIAGNOSIS — I498 Other specified cardiac arrhythmias: Secondary | ICD-10-CM

## 2013-01-15 LAB — REMOTE PACEMAKER DEVICE
AL AMPLITUDE: 2.8 mv
AL IMPEDENCE PM: 501 Ohm
ATRIAL PACING PM: 52
RV LEAD IMPEDENCE PM: 607 Ohm
RV LEAD THRESHOLD: 0.625 V
VENTRICULAR PACING PM: 0

## 2013-01-27 ENCOUNTER — Encounter: Payer: Self-pay | Admitting: *Deleted

## 2013-01-30 ENCOUNTER — Encounter: Payer: Self-pay | Admitting: Internal Medicine

## 2013-02-18 ENCOUNTER — Encounter: Payer: Self-pay | Admitting: Gynecology

## 2013-02-18 ENCOUNTER — Ambulatory Visit (INDEPENDENT_AMBULATORY_CARE_PROVIDER_SITE_OTHER): Payer: Medicare Other | Admitting: Gynecology

## 2013-02-18 DIAGNOSIS — R071 Chest pain on breathing: Secondary | ICD-10-CM

## 2013-02-18 NOTE — Patient Instructions (Signed)
Office will contact you to arrange ultrasound and possible mammographic studies.

## 2013-02-18 NOTE — Progress Notes (Signed)
Patient presents in followup. Was evaluated in early February with left tail of Spence/anterior axillary line discomfort following mammogram. Her exam at that time was normal and her mammogram was negative. Recommended observation with OTC nonsteroidal anti-inflammatories. Patient has done this to include heat and this discomfort still persists. It comes and goes sometimes whenever she lifts her arms posteriorly. No palpable masses to self-exam.  Exam with Kim Asst. Both breasts examined lying and sitting without masses retractions discharge adenopathy. Tender area is at the left tail of Spence anterior axillary line along the pectoralis edge. Her pacemaker is in this area a little superior to it.  Assessment and plan: Persistent discomfort following mammogram. Questionable fat necrosis. No findings on physical exam. Circumstantialy does not appear to be musculoskeletal although she does report when she moves her arms upward and posterior show have some discomfort in this area. We'll recommend followup ultrasound possible diagnostic mammogram at radiologist discretion. Assuming all negative will plan continued expectant management at this point with continued heat and nonsteroidal anti-inflammatories.

## 2013-02-19 ENCOUNTER — Telehealth: Payer: Self-pay | Admitting: *Deleted

## 2013-02-19 DIAGNOSIS — N644 Mastodynia: Secondary | ICD-10-CM

## 2013-02-19 NOTE — Telephone Encounter (Signed)
Order placed for mammogram.

## 2013-02-19 NOTE — Telephone Encounter (Signed)
Message copied by Aura Camps on Thu Feb 19, 2013  9:25 AM ------      Message from: Dara Lords      Created: Wed Feb 18, 2013  3:22 PM       Schedule ultrasound possible diagnostic mammography left breast at the breast Center reference persistent pain following prior mammography with physical exam negative ------

## 2013-02-20 NOTE — Telephone Encounter (Signed)
Appt. 03/04/13 @ 3:00 pm

## 2013-02-23 ENCOUNTER — Other Ambulatory Visit: Payer: Self-pay | Admitting: *Deleted

## 2013-02-23 DIAGNOSIS — K219 Gastro-esophageal reflux disease without esophagitis: Secondary | ICD-10-CM

## 2013-02-23 MED ORDER — NIZATIDINE 150 MG PO CAPS: 150.0000 mg | ORAL_CAPSULE | Freq: Two times a day (BID) | ORAL | Status: DC

## 2013-03-04 ENCOUNTER — Ambulatory Visit
Admission: RE | Admit: 2013-03-04 | Discharge: 2013-03-04 | Disposition: A | Discharge status: Home / Self Care | Payer: Medicare Other | Source: Ambulatory Visit | Attending: Gynecology | Admitting: Gynecology

## 2013-03-04 DIAGNOSIS — N644 Mastodynia: Secondary | ICD-10-CM

## 2013-04-01 ENCOUNTER — Other Ambulatory Visit: Payer: Self-pay | Admitting: *Deleted

## 2013-04-01 DIAGNOSIS — F419 Anxiety disorder, unspecified: Secondary | ICD-10-CM

## 2013-04-01 MED ORDER — LORAZEPAM 1 MG PO TABS: 1.0000 mg | ORAL_TABLET | Freq: Four times a day (QID) | ORAL | Status: DC | PRN

## 2013-04-01 NOTE — Telephone Encounter (Signed)
Pt needs refill on Lorezapam. Will route to his nurse for the ok on this med

## 2013-04-01 NOTE — Telephone Encounter (Signed)
rx called

## 2013-04-03 ENCOUNTER — Other Ambulatory Visit: Payer: Self-pay | Admitting: *Deleted

## 2013-04-03 DIAGNOSIS — F419 Anxiety disorder, unspecified: Secondary | ICD-10-CM

## 2013-04-04 MED ORDER — LORAZEPAM 1 MG PO TABS: 1.0000 mg | ORAL_TABLET | Freq: Four times a day (QID) | ORAL | Status: DC | PRN

## 2013-04-13 ENCOUNTER — Ambulatory Visit (INDEPENDENT_AMBULATORY_CARE_PROVIDER_SITE_OTHER): Payer: Medicare Other | Admitting: *Deleted

## 2013-04-13 DIAGNOSIS — R001 Bradycardia, unspecified: Secondary | ICD-10-CM

## 2013-04-13 DIAGNOSIS — I498 Other specified cardiac arrhythmias: Secondary | ICD-10-CM

## 2013-04-13 DIAGNOSIS — Z950 Presence of cardiac pacemaker: Secondary | ICD-10-CM

## 2013-04-28 LAB — REMOTE PACEMAKER DEVICE
AL AMPLITUDE: 2.8 mv
BAMS-0001: 175 {beats}/min
BATTERY VOLTAGE: 2.72 V
RV LEAD AMPLITUDE: 22.4 mv
RV LEAD IMPEDENCE PM: 622 Ohm

## 2013-05-01 ENCOUNTER — Telehealth: Payer: Self-pay | Admitting: Cardiology

## 2013-05-01 DIAGNOSIS — L659 Nonscarring hair loss, unspecified: Secondary | ICD-10-CM

## 2013-05-01 DIAGNOSIS — E7800 Pure hypercholesterolemia, unspecified: Secondary | ICD-10-CM

## 2013-05-01 NOTE — Telephone Encounter (Signed)
Agree with plan to get thyroid functions

## 2013-05-01 NOTE — Telephone Encounter (Signed)
New Problem  Pt is wanting to know if you received her remote pacer check because she has not received a letter about it.   Pt also wants to speak with you regarding some problems she is having with her medication.

## 2013-05-01 NOTE — Telephone Encounter (Signed)
Advised patient

## 2013-05-01 NOTE — Telephone Encounter (Signed)
Takes Diltiazem 120 mg daily and Lasix 10 mg every other day and over the last 2 months her hair loss has gotten a lot worse. Also she has noticed she become more irritable. She is concerned these may be coming from her medications. Has labs (will add tsh/ft4) next week and ov following. Will forward to  Dr. Patty Sermons for review

## 2013-05-08 ENCOUNTER — Encounter: Payer: Self-pay | Admitting: *Deleted

## 2013-05-08 ENCOUNTER — Other Ambulatory Visit (INDEPENDENT_AMBULATORY_CARE_PROVIDER_SITE_OTHER): Payer: Medicare Other

## 2013-05-08 DIAGNOSIS — L659 Nonscarring hair loss, unspecified: Secondary | ICD-10-CM

## 2013-05-08 DIAGNOSIS — E7800 Pure hypercholesterolemia, unspecified: Secondary | ICD-10-CM

## 2013-05-08 LAB — BASIC METABOLIC PANEL
CO2: 24 mEq/L (ref 19–32)
Calcium: 9.2 mg/dL (ref 8.4–10.5)
Chloride: 103 mEq/L (ref 96–112)
Glucose, Bld: 89 mg/dL (ref 70–99)
Sodium: 139 mEq/L (ref 135–145)

## 2013-05-08 LAB — LIPID PANEL
Cholesterol: 219 mg/dL — ABNORMAL HIGH (ref 0–200)
HDL: 62 mg/dL (ref >39.00)
Total CHOL/HDL Ratio: 4
Triglycerides: 86 mg/dL (ref 0.0–149.0)
VLDL: 17.2 mg/dL (ref 0.0–40.0)

## 2013-05-08 LAB — HEPATIC FUNCTION PANEL
Albumin: 4.2 g/dL (ref 3.5–5.2)
Alkaline Phosphatase: 43 U/L (ref 39–117)
Total Protein: 7.2 g/dL (ref 6.0–8.3)

## 2013-05-08 LAB — T4, FREE: Free T4: 0.97 ng/dL (ref 0.60–1.60)

## 2013-05-08 NOTE — Progress Notes (Signed)
Quick Note:  Please make copy of labs for patient visit. ______ 

## 2013-05-11 ENCOUNTER — Encounter: Payer: Self-pay | Admitting: Cardiology

## 2013-05-11 ENCOUNTER — Ambulatory Visit (INDEPENDENT_AMBULATORY_CARE_PROVIDER_SITE_OTHER): Payer: Medicare Other | Admitting: Cardiology

## 2013-05-11 VITALS — BP 122/74 | HR 66 | Ht 64.5 in | Wt 124.8 lb

## 2013-05-11 DIAGNOSIS — E785 Hyperlipidemia, unspecified: Secondary | ICD-10-CM

## 2013-05-11 DIAGNOSIS — I119 Hypertensive heart disease without heart failure: Secondary | ICD-10-CM

## 2013-05-11 DIAGNOSIS — L659 Nonscarring hair loss, unspecified: Secondary | ICD-10-CM

## 2013-05-11 DIAGNOSIS — M1710 Unilateral primary osteoarthritis, unspecified knee: Secondary | ICD-10-CM

## 2013-05-11 DIAGNOSIS — E7800 Pure hypercholesterolemia, unspecified: Secondary | ICD-10-CM

## 2013-05-11 DIAGNOSIS — M179 Osteoarthritis of knee, unspecified: Secondary | ICD-10-CM

## 2013-05-11 NOTE — Assessment & Plan Note (Signed)
Blood pressure was remaining stable on current medication.  She is wondering whether the diltiazem could be contributing to her hair loss.  She is intolerant to most of the blood pressure medications that we have previously tried.  She will consult with her dermatologist to see which if any of her medications would be felt to be contributing to the alopecia problem.

## 2013-05-11 NOTE — Patient Instructions (Signed)
Ok to start Biotin, continue all other medications  Your physician wants you to follow-up in: 4 months with fasting labs (lp/bmet/hfp)  You will receive a reminder letter in the mail two months in advance. If you don't receive a letter, please call our office to schedule the follow-up appointment.

## 2013-05-11 NOTE — Assessment & Plan Note (Signed)
The patient has not been able to walk on a treadmill because of her arthritis of the right knee.  She is still trying to avoid having to have surgery on the knee.  She is able to use the recumbent bicycle but has not been doing so because she has been busy with other things.

## 2013-05-11 NOTE — Progress Notes (Signed)
[name] Date of Birth:  05-Jul-1946 [facility] [address] [place], [place]  [address] [phone]         Fax   [phone]  History of Present Illness: This pleasant 67 year old woman is seen for a scheduled followup office visit. He has a past history of essential hypertension and a history of symptomatic palpitations. She also has a functioning dual-chamber pacemaker implanted by Dr. Reyes Ivan. The patient has a history of atypical chest pain. She does not have any history of known ischemic heart disease. She does have a history of gastroesophageal reflux disease. She underwent upper endoscopy and colonoscopy at the end of June by her gastroenterologist Dr. Russella Dar. A few colonic polyps were found and she will need another colonoscopy in 5 years. The patient has been experiencing occasional palpitations.  The patient has been experiencing some alopecia.  She is due to see her dermatologist soon.   Current Outpatient Prescriptions  Medication Sig Dispense Refill  . Acetaminophen (TYLENOL PO) Take by mouth as needed.       Marland Kitchen BIOTIN PO Take by mouth.      . diltiazem (CARDIZEM CD) 120 MG 24 hr capsule Take 1 capsule (120 mg total) by mouth daily.  30 capsule  5  . furosemide (LASIX) 20 MG tablet Take 20 mg by mouth as directed. 1/2 tablet every other day      . LORazepam (ATIVAN) 1 MG tablet Take 1 tablet (1 mg total) by mouth every 6 (six) hours as needed.  120 tablet  2  . nitroGLYCERIN (NITROSTAT) 0.4 MG SL tablet Place 1 tablet (0.4 mg total) under the tongue every 5 (five) minutes as needed. For up to 3 doses  25 tablet  4  . nizatidine (AXID) 150 MG capsule Take 1 capsule (150 mg total) by mouth 2 (two) times daily.  60 capsule  11   No current facility-administered medications for this visit.    Allergies  Allergen Reactions  . Antihistamines, Loratadine-Type   . Bisoprolol-Hydrochlorothiazide     Per pt: unknown  . Bystolic (Nebivolol Hcl)     Per  pt: unknown  . Cephalexin Nausea And Vomiting  . Codeine     Increased heart rate, anxiety  . Demerol (Meperidine)     Increased heart rate, anxiety  . Epinephrine     Increased heart rate  . Erythromycin     REACTION: nausea  . Hyoscyamine Sulfate     Per pt: unknown  . Inderal (Propranolol)     Extreme tiredness  . Lidocaine     Family hx of malignant hyperthermia  . Lipitor (Atorvastatin Calcium)     aches  . Lipitor (Atorvastatin)     Muscle aches  . Losartan Potassium     Per pt: unknown  . Morphine     Nightmares, increased BP  . Paxil (Paroxetine Hcl)     Per pt: unknown  . Penicillins Nausea And Vomiting  . Propranolol Hcl     Per pt: unknown  . Sertraline Hcl     Per pt: unknown  . Tetracycline Nausea And Vomiting  . Toprol Xl (Metoprolol Succinate)     Extreme tiredness  . Verapamil     Per pt: unknown    Patient Active Problem List   Diagnosis Date Noted  . HYPERLIPIDEMIA 03/30/2008    Priority: High  . Osteoarthritis of knee 04/19/2011    Priority: Medium  . CHEST PAIN 06/28/2009    Priority:  Medium  . Alopecia 05/11/2013  . Benign hypertensive heart disease without heart failure 01/07/2013  . URI (upper respiratory infection) 10/27/2012  . Pacemaker-Medtronic 10/08/2012  . Hiatal hernia 05/28/2012  . Esophageal stricture 05/28/2012  . Dysuria 04/03/2012  . Osteoporosis screening 01/16/2012  . Breast mass 01/16/2012  . Anxiety 01/16/2012  . Bradycardia   . Endometriosis   . Fibroid   . Internal hemorrhoids 02/28/2011  . Rectal or anal pain 02/28/2011  . History of colon polyps 02/28/2011  . Constipation 02/28/2011  . GERD 06/28/2009  . COLONIC POLYPS, ADENOMATOUS, HX OF 06/24/2009  . HEMORRHOIDS 03/30/2008  . SLEEP APNEA 03/30/2008  . SKIN CANCER, HX OF 03/30/2008    History  Smoking status  . Never Smoker   Smokeless tobacco  . Never Used    History  Alcohol Use No    Family History  Problem Relation Age of Onset  . Colon  cancer Neg Hx   . Heart failure Sister   . Heart disease Sister   . Hypertension Sister   . Dementia Mother   . Hypertension Mother   . Hypertension Father   . Arthritis Father   . Breast cancer Maternal Aunt     Age 47's  . Diabetes Maternal Aunt   . Cancer Maternal Aunt     breast and ovarian  . Diabetes Maternal Uncle     Review of Systems: Constitutional: no fever chills diaphoresis or fatigue or change in weight.  Head and neck: no hearing loss, no epistaxis, no photophobia or visual disturbance. Respiratory: No cough, shortness of breath or wheezing. Cardiovascular: No chest pain peripheral edema, palpitations. Gastrointestinal: No abdominal distention, no abdominal pain, no change in bowel habits hematochezia or melena. Genitourinary: No dysuria, no frequency, no urgency, no nocturia. Musculoskeletal:No arthralgias, no back pain, no gait disturbance or myalgias. Neurological: No dizziness, no headaches, no numbness, no seizures, no syncope, no weakness, no tremors. Hematologic: No lymphadenopathy, no easy bruising. Psychiatric: No confusion, no hallucinations, no sleep disturbance.    Physical Exam: Filed Vitals:   05/11/13 1012  BP: 122/74  Pulse: 66   the general appearance reveals a well-developed well-nourished woman in no distress.The head and neck exam reveals pupils equal and reactive.  Extraocular movements are full.  There is no scleral icterus.  The mouth and pharynx are normal.  The neck is supple.  The carotids reveal no bruits.  The jugular venous pressure is normal.  The  thyroid is not enlarged.  There is no lymphadenopathy.  The chest is clear to percussion and auscultation.  There are no rales or rhonchi.  Expansion of the chest is symmetrical.  The precordium is quiet.  The first heart sound is normal.  The second heart sound is physiologically split.  There is no murmur gallop rub or click.  There is no abnormal lift or heave.  The abdomen is soft and  nontender.  The bowel sounds are normal.  The liver and spleen are not enlarged.  There are no abdominal masses.  There are no abdominal bruits.  Extremities reveal good pedal pulses.  There is no phlebitis or edema.  There is no cyanosis or clubbing.  Strength is normal and symmetrical in all extremities.  There is no lateralizing weakness.  There are no sensory deficits.  The skin is warm and dry.  There is no rash.  Her scalp reveals thinning of the hair.     Assessment / Plan: Continue on same medication.  She  will add Biotin one daily empirically.. Recheck in 4 months for followup office visit lipid panel hepatic function panel and basal metabolic panel.  Watch diet carefully.  Her weight is up 1 pound since last visit.

## 2013-05-11 NOTE — Assessment & Plan Note (Signed)
Recent lab work was reviewed.  Generally satisfactory except her LDL cholesterol is still high.  She is intolerant of statins.  She will work harder on diet and regular exercise

## 2013-05-20 ENCOUNTER — Encounter: Payer: Self-pay | Admitting: Internal Medicine

## 2013-07-06 ENCOUNTER — Other Ambulatory Visit: Payer: Self-pay | Admitting: *Deleted

## 2013-07-06 DIAGNOSIS — I119 Hypertensive heart disease without heart failure: Secondary | ICD-10-CM

## 2013-07-06 MED ORDER — DILTIAZEM HCL ER COATED BEADS 120 MG PO CP24: 120.0000 mg | ORAL_CAPSULE | Freq: Every day | ORAL | Status: DC

## 2013-07-20 ENCOUNTER — Ambulatory Visit (INDEPENDENT_AMBULATORY_CARE_PROVIDER_SITE_OTHER): Payer: Medicare Other | Admitting: *Deleted

## 2013-07-20 DIAGNOSIS — Z950 Presence of cardiac pacemaker: Secondary | ICD-10-CM

## 2013-07-20 DIAGNOSIS — R001 Bradycardia, unspecified: Secondary | ICD-10-CM

## 2013-07-20 DIAGNOSIS — I498 Other specified cardiac arrhythmias: Secondary | ICD-10-CM

## 2013-08-06 LAB — REMOTE PACEMAKER DEVICE
AL IMPEDENCE PM: 506 Ohm
AL THRESHOLD: 0.625 V
ATRIAL PACING PM: 58
BAMS-0001: 175 {beats}/min
RV LEAD AMPLITUDE: 22.4 mv
RV LEAD THRESHOLD: 0.75 V

## 2013-08-07 ENCOUNTER — Telehealth: Payer: Self-pay | Admitting: Family Medicine

## 2013-08-07 ENCOUNTER — Encounter: Payer: Self-pay | Admitting: Internal Medicine

## 2013-08-07 ENCOUNTER — Ambulatory Visit (INDEPENDENT_AMBULATORY_CARE_PROVIDER_SITE_OTHER): Payer: Medicare Other | Admitting: Internal Medicine

## 2013-08-07 VITALS — BP 130/80 | HR 62 | Temp 98.5°F | Wt 126.0 lb

## 2013-08-07 DIAGNOSIS — R5381 Other malaise: Secondary | ICD-10-CM

## 2013-08-07 DIAGNOSIS — R5383 Other fatigue: Secondary | ICD-10-CM

## 2013-08-07 DIAGNOSIS — K5289 Other specified noninfective gastroenteritis and colitis: Secondary | ICD-10-CM

## 2013-08-07 DIAGNOSIS — K529 Noninfective gastroenteritis and colitis, unspecified: Secondary | ICD-10-CM

## 2013-08-07 NOTE — Progress Notes (Signed)
Subjective:    Patient ID: [name], female    DOB: August 07, 1946, 67 y.o.   MRN: [id number]  HPI Here with husband  Called nurse line for advice Stomach cramps and then loose stools started 5 days ago Kept going for 3 days Some improvement but still "wiped out"  Still with cramping--will run to bathroom but not much happens Stools mostly back to normal  No fever No nausea Husband had similar illness but lasted less time Not eating well this week Drinking lots of fluids  No clear cut abdominal pain No blood in stools ---no dark stools  Current Outpatient Prescriptions on File Prior to Visit  Medication Sig Dispense Refill  . Acetaminophen (TYLENOL PO) Take by mouth as needed.       Marland Kitchen BIOTIN PO Take by mouth.      . diltiazem (CARDIZEM CD) 120 MG 24 hr capsule Take 1 capsule (120 mg total) by mouth daily.  30 capsule  5  . furosemide (LASIX) 20 MG tablet Take 20 mg by mouth as directed. 1/2 tablet every other day      . LORazepam (ATIVAN) 1 MG tablet Take 1 tablet (1 mg total) by mouth every 6 (six) hours as needed.  120 tablet  2  . nitroGLYCERIN (NITROSTAT) 0.4 MG SL tablet Place 1 tablet (0.4 mg total) under the tongue every 5 (five) minutes as needed. For up to 3 doses  25 tablet  4  . nizatidine (AXID) 150 MG capsule Take 1 capsule (150 mg total) by mouth 2 (two) times daily.  60 capsule  11   No current facility-administered medications on file prior to visit.    Allergies  Allergen Reactions  . Antihistamines, Loratadine-Type   . Bisoprolol-Hydrochlorothiazide     Per pt: unknown  . Bystolic [Nebivolol Hcl]     Per pt: unknown  . Cephalexin Nausea And Vomiting  . Codeine     Increased heart rate, anxiety  . Demerol [Meperidine]     Increased heart rate, anxiety  . Epinephrine     Increased heart rate  . Erythromycin     REACTION: nausea  . Hyoscyamine Sulfate     Per pt: unknown  . Inderal [Propranolol]     Extreme tiredness  . Lidocaine     Family  hx of malignant hyperthermia  . Lipitor [Atorvastatin Calcium]     aches  . Lipitor [Atorvastatin]     Muscle aches  . Losartan Potassium     Per pt: unknown  . Morphine     Nightmares, increased BP  . Paxil [Paroxetine Hcl]     Per pt: unknown  . Penicillins Nausea And Vomiting  . Propranolol Hcl     Per pt: unknown  . Sertraline Hcl     Per pt: unknown  . Tetracycline Nausea And Vomiting  . Toprol Xl [Metoprolol Succinate]     Extreme tiredness  . Verapamil     Per pt: unknown    Past Medical History  Diagnosis Date  . Colon polyp     adenomatous  . Hemorrhoids   . Inguinal hernia   . Hyperlipidemia   . Sleep apnea     can't use c-pap  . Allergic rhinitis   . GERD (gastroesophageal reflux disease)   . Bradycardia     s/p MDT PPM  . Atypical chest pain   . Hypertension   . Pacemaker   . Endometriosis   . Fibroid   . Skin cancer  Basal and squamous cell  . Anemia   . Arthritis   . Blood in stool   . History of chicken pox   . Depression   . Cardiac arrhythmia due to congenital heart disease   . Heart murmur   . Thyroid disease   . UTI (urinary tract infection)   . Osteopenia 12/2012    T score -1.7 FRAX 8.5%/1.1%    Past Surgical History  Procedure Laterality Date  . Thyroidectomy  1979    RIGHT SIDE  . Polypectomy    . Pacemaker insertion  2006    Dr. Reyes Ivan  . Abdominal hysterectomy    . Knee arthroscopy  2011  . Cesarean section    . Dilation and curettage of uterus    . Thumb surgery    . Oophorectomy  1995    LSO and RSO  . Tear duct surgery  2010  . Hand surgery  1992  . Inguinal hernia repair  02/12/1994    left inguinal hernia repair  . Hernia repair      Family History  Problem Relation Age of Onset  . Colon cancer Neg Hx   . Heart failure Sister   . Heart disease Sister   . Hypertension Sister   . Dementia Mother   . Hypertension Mother   . Hypertension Father   . Arthritis Father   . Breast cancer Maternal Aunt      Age 66's  . Diabetes Maternal Aunt   . Cancer Maternal Aunt     breast and ovarian  . Diabetes Maternal Uncle     History   Social History  . Marital Status: Married    Spouse Name: N/A    Number of Children: 1  . Years of Education: N/A   Occupational History  . Retired    Social History Main Topics  . Smoking status: Never Smoker   . Smokeless tobacco: Never Used  . Alcohol Use: No  . Drug Use: No  . Sexual Activity: Not Currently    Birth Control/ Protection: Surgical   Other Topics Concern  . 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 Stable urinary urgency    Objective:   Physical Exam  Constitutional: She appears well-developed and well-nourished. No distress.  Neck: Normal range of motion. Neck supple. No thyromegaly present.  Cardiovascular: Normal rate, regular rhythm and normal heart sounds.  Exam reveals no gallop.   No murmur heard. Pulmonary/Chest: Effort normal and breath sounds normal. No respiratory distress. She has no wheezes. She has no rales.  Abdominal: Soft. Bowel sounds are normal. She exhibits no distension and no mass. There is no tenderness. There is no rebound and no guarding.  Musculoskeletal: She exhibits no edema and no tenderness.  Lymphadenopathy:    She has no cervical adenopathy.  Psychiatric: She has a normal mood and affect. Her behavior is normal.          Assessment & Plan:

## 2013-08-07 NOTE — Telephone Encounter (Signed)
Patient Information:  Caller Name: Jurnie Garritano  Phone: [phone]  Patient: [name], [name]  Gender: Female  DOB: [birth year]  Age: 67 Years  PCP: Crawford Givens Clelia Croft) [facility])  Office Follow Up:  Does the office need to follow up with this patient?: No  Instructions For The Office: N/A   Symptoms  Reason For Call & Symptoms: Sun abd cramping    by afternoon diarrhea, cramps worse     Mon worse.   Better wed thurs     now feeling wiped out   cramping  intermittent - feels like goiing to have stool but does not     d x 4 /24 horuea  Reviewed Health History In EMR: Yes  Reviewed Medications In EMR: Yes  Reviewed Allergies In EMR: Yes  Reviewed Surgeries / Procedures: Yes  Date of Onset of Symptoms: 08/02/2013  Treatments Tried: fluids   tylenol  Treatments Tried Worked: No  Guideline(s) Used:  Diarrhea  Disposition Per Guideline:   See Today in Office  Reason For Disposition Reached:   Abdominal pain (Exception: pain clears completely with each passage of diarrhea stool)  Advice Given:  N/A  Patient Will Follow Care Advice:  YES  Appointment Scheduled:  08/07/2013 15:15:00 Appointment Scheduled Provider:  Tillman Abide (Family Practice)

## 2013-08-07 NOTE — Telephone Encounter (Signed)
Will see at OV 

## 2013-08-07 NOTE — Assessment & Plan Note (Signed)
Probably viral Improving now No fever or tenderness

## 2013-08-07 NOTE — Assessment & Plan Note (Signed)
With this illness No signs of dehydration Probably just recuperating  Extra stress with visiting mom in nursing home daily If not improved next week, will check labs

## 2013-08-07 NOTE — Telephone Encounter (Signed)
Pt was seen at office visit.

## 2013-08-10 ENCOUNTER — Encounter: Payer: Self-pay | Admitting: *Deleted

## 2013-08-17 ENCOUNTER — Other Ambulatory Visit: Payer: Self-pay | Admitting: Dermatology

## 2013-08-19 ENCOUNTER — Encounter: Payer: Self-pay | Admitting: Internal Medicine

## 2013-08-30 ENCOUNTER — Encounter: Payer: Self-pay | Admitting: Family Medicine

## 2013-09-03 ENCOUNTER — Other Ambulatory Visit: Payer: Self-pay

## 2013-09-03 DIAGNOSIS — Z1231 Encounter for screening mammogram for malignant neoplasm of breast: Secondary | ICD-10-CM

## 2013-09-04 ENCOUNTER — Encounter: Payer: Self-pay | Admitting: Cardiology

## 2013-09-07 ENCOUNTER — Encounter: Payer: Self-pay | Admitting: Cardiology

## 2013-09-09 ENCOUNTER — Encounter: Payer: Self-pay | Admitting: Cardiology

## 2013-09-11 ENCOUNTER — Ambulatory Visit (INDEPENDENT_AMBULATORY_CARE_PROVIDER_SITE_OTHER): Payer: Medicare Other

## 2013-09-11 DIAGNOSIS — Z23 Encounter for immunization: Secondary | ICD-10-CM

## 2013-09-17 ENCOUNTER — Other Ambulatory Visit (INDEPENDENT_AMBULATORY_CARE_PROVIDER_SITE_OTHER): Payer: Medicare Other

## 2013-09-17 DIAGNOSIS — I119 Hypertensive heart disease without heart failure: Secondary | ICD-10-CM

## 2013-09-17 DIAGNOSIS — E7800 Pure hypercholesterolemia, unspecified: Secondary | ICD-10-CM

## 2013-09-17 LAB — LIPID PANEL
Cholesterol: 215 mg/dL — ABNORMAL HIGH (ref 0–200)
HDL: 64.4 mg/dL (ref >39.00)
Total CHOL/HDL Ratio: 3
Triglycerides: 94 mg/dL (ref 0.0–149.0)

## 2013-09-17 LAB — HEPATIC FUNCTION PANEL
ALT: 19 U/L (ref 0–35)
AST: 21 U/L (ref 0–37)
Albumin: 4.4 g/dL (ref 3.5–5.2)

## 2013-09-17 LAB — BASIC METABOLIC PANEL
CO2: 28 mEq/L (ref 19–32)
Calcium: 9.5 mg/dL (ref 8.4–10.5)
GFR: 68.37 mL/min (ref >60.00)
Potassium: 4 mEq/L (ref 3.5–5.1)
Sodium: 139 mEq/L (ref 135–145)

## 2013-09-17 LAB — LDL CHOLESTEROL, DIRECT: Direct LDL: 131.6 mg/dL

## 2013-09-17 NOTE — Progress Notes (Signed)
Quick Note:  Please make copy of labs for patient visit. ______ 

## 2013-09-23 ENCOUNTER — Encounter: Payer: Self-pay | Admitting: Cardiology

## 2013-09-23 ENCOUNTER — Ambulatory Visit (INDEPENDENT_AMBULATORY_CARE_PROVIDER_SITE_OTHER): Payer: Medicare Other | Admitting: Cardiology

## 2013-09-23 VITALS — BP 110/88 | HR 66 | Ht 64.0 in | Wt 124.0 lb

## 2013-09-23 DIAGNOSIS — R002 Palpitations: Secondary | ICD-10-CM

## 2013-09-23 DIAGNOSIS — Z950 Presence of cardiac pacemaker: Secondary | ICD-10-CM

## 2013-09-23 DIAGNOSIS — E785 Hyperlipidemia, unspecified: Secondary | ICD-10-CM

## 2013-09-23 DIAGNOSIS — L659 Nonscarring hair loss, unspecified: Secondary | ICD-10-CM

## 2013-09-23 DIAGNOSIS — I119 Hypertensive heart disease without heart failure: Secondary | ICD-10-CM

## 2013-09-23 NOTE — Progress Notes (Signed)
[name] Date of Birth:  04/20/1946 [address] [place], [place]  [address] [phone]         Fax   [phone]  History of Present Illness: This pleasant 67 year old woman is seen for a scheduled followup office visit. He has a past history of essential hypertension and a history of symptomatic palpitations. She also has a functioning dual-chamber pacemaker implanted by Dr. Reyes Ivan in about 2006. The patient has a history of atypical chest pain. She does not have any history of known ischemic heart disease. She does have a history of gastroesophageal reflux disease. She underwent upper endoscopy and colonoscopy at the end of June by her gastroenterologist Dr. Russella Dar. A few colonic polyps were found and she will need another colonoscopy in 5 years. The patient has been experiencing occasional palpitations.  The patient has been experiencing some alopecia.  She has seen her dermatologist.  He checked her iron level and recommended trying a multivitamin with iron.   Current Outpatient Prescriptions  Medication Sig Dispense Refill  . Acetaminophen (TYLENOL PO) Take by mouth as needed.       Marland Kitchen BIOTIN PO Take by mouth.      . diltiazem (CARDIZEM CD) 120 MG 24 hr capsule Take 1 capsule (120 mg total) by mouth daily.  30 capsule  5  . furosemide (LASIX) 20 MG tablet Take 20 mg by mouth as needed.       Marland Kitchen LORazepam (ATIVAN) 1 MG tablet Take 1 tablet (1 mg total) by mouth every 6 (six) hours as needed.  120 tablet  2  . nitroGLYCERIN (NITROSTAT) 0.4 MG SL tablet Place 1 tablet (0.4 mg total) under the tongue every 5 (five) minutes as needed. For up to 3 doses  25 tablet  4  . nizatidine (AXID) 150 MG capsule Take 1 capsule (150 mg total) by mouth 2 (two) times daily.  60 capsule  11   No current facility-administered medications for this visit.    Allergies  Allergen Reactions  . Antihistamines, Loratadine-Type   . Bisoprolol-Hydrochlorothiazide     Per pt: unknown  .  Bystolic [Nebivolol Hcl]     Per pt: unknown  . Cephalexin Nausea And Vomiting  . Codeine     Increased heart rate, anxiety  . Demerol [Meperidine]     Increased heart rate, anxiety  . Epinephrine     Increased heart rate  . Erythromycin     REACTION: nausea  . Hyoscyamine Sulfate     Per pt: unknown  . Inderal [Propranolol]     Extreme tiredness  . Lidocaine     Family hx of malignant hyperthermia  . Lipitor [Atorvastatin Calcium]     aches  . Lipitor [Atorvastatin]     Muscle aches  . Losartan Potassium     Per pt: unknown  . Morphine     Nightmares, increased BP  . Paxil [Paroxetine Hcl]     Per pt: unknown  . Penicillins Nausea And Vomiting  . Propranolol Hcl     Per pt: unknown  . Sertraline Hcl     Per pt: unknown  . Tetracycline Nausea And Vomiting  . Toprol Xl [Metoprolol Succinate]     Extreme tiredness  . Verapamil     Per pt: unknown    Patient Active Problem List   Diagnosis Date Noted  . HYPERLIPIDEMIA 03/30/2008    Priority: High  . Osteoarthritis of knee 04/19/2011    Priority: Medium  .  CHEST PAIN 06/28/2009    Priority: Medium  . Gastroenteritis 08/07/2013  . Fatigue 08/07/2013  . Alopecia 05/11/2013  . Benign hypertensive heart disease without heart failure 01/07/2013  . URI (upper respiratory infection) 10/27/2012  . Pacemaker-Medtronic 10/08/2012  . Hiatal hernia 05/28/2012  . Esophageal stricture 05/28/2012  . Dysuria 04/03/2012  . Osteoporosis screening 01/16/2012  . Breast mass 01/16/2012  . Anxiety 01/16/2012  . Bradycardia   . Endometriosis   . Fibroid   . Internal hemorrhoids 02/28/2011  . Rectal or anal pain 02/28/2011  . History of colon polyps 02/28/2011  . Constipation 02/28/2011  . GERD 06/28/2009  . COLONIC POLYPS, ADENOMATOUS, HX OF 06/24/2009  . HEMORRHOIDS 03/30/2008  . SLEEP APNEA 03/30/2008  . History of SCC (squamous cell carcinoma) of skin 03/30/2008    History  Smoking status  . Never Smoker     Smokeless tobacco  . Never Used    History  Alcohol Use No    Family History  Problem Relation Age of Onset  . Colon cancer Neg Hx   . Heart failure Sister   . Heart disease Sister   . Hypertension Sister   . Dementia Mother   . Hypertension Mother   . Hypertension Father   . Arthritis Father   . Breast cancer Maternal Aunt     Age 3's  . Diabetes Maternal Aunt   . Cancer Maternal Aunt     breast and ovarian  . Diabetes Maternal Uncle     Review of Systems: Constitutional: no fever chills diaphoresis or fatigue or change in weight.  Head and neck: no hearing loss, no epistaxis, no photophobia or visual disturbance. Respiratory: No cough, shortness of breath or wheezing. Cardiovascular: No chest pain peripheral edema, palpitations. Gastrointestinal: No abdominal distention, no abdominal pain, no change in bowel habits hematochezia or melena. Genitourinary: No dysuria, no frequency, no urgency, no nocturia. Musculoskeletal:No arthralgias, no back pain, no gait disturbance or myalgias. Neurological: No dizziness, no headaches, no numbness, no seizures, no syncope, no weakness, no tremors. Hematologic: No lymphadenopathy, no easy bruising. Psychiatric: No confusion, no hallucinations, no sleep disturbance.    Physical Exam: Filed Vitals:   09/23/13 1112  BP: 110/88  Pulse: 66   the general appearance reveals a well-developed well-nourished woman in no distress.The head and neck exam reveals pupils equal and reactive.  Extraocular movements are full.  There is no scleral icterus.  The mouth and pharynx are normal.  The neck is supple.  The carotids reveal no bruits.  The jugular venous pressure is normal.  The  thyroid is not enlarged.  There is no lymphadenopathy.  The chest is clear to percussion and auscultation.  There are no rales or rhonchi.  Expansion of the chest is symmetrical.  The precordium is quiet.  The first heart sound is normal.  The second heart sound is  physiologically split.  There is no murmur gallop rub or click.  There is no abnormal lift or heave.  The abdomen is soft and nontender.  The bowel sounds are normal.  The liver and spleen are not enlarged.  There are no abdominal masses.  There are no abdominal bruits.  Extremities reveal good pedal pulses.  There is no phlebitis or edema.  There is no cyanosis or clubbing.  Strength is normal and symmetrical in all extremities.  There is no lateralizing weakness.  There are no sensory deficits.  The skin is warm and dry.  There is no rash.  Her scalp reveals thinning of the hair.     Assessment / Plan: Continue on same medication.  Stop Lasix except occasionally when necessary for excessive fluid.  Observe for any improvement in alopecia condition.  Recheck in 4 months for followup office visit lipid panel hepatic function panel and basal metabolic panel

## 2013-09-23 NOTE — Assessment & Plan Note (Signed)
The patient states that she noticed rapid palpitations occurring occasionally.  She feels her radial pulse to confirm the palpitations.  They last only a few seconds and are not precipitated by any particular activity.

## 2013-09-23 NOTE — Patient Instructions (Signed)
USE LASIX AS NEEDED ONLY   Your physician wants you to follow-up in: 4 months with fasting labs (lp/bmet/hfp) You will receive a reminder letter in the mail two months in advance. If you don't receive a letter, please call our office to schedule the follow-up appointment.

## 2013-09-23 NOTE — Assessment & Plan Note (Signed)
Blood pressure has been remaining stable on current therapy.  No headaches dizziness or syncope. 

## 2013-09-23 NOTE — Assessment & Plan Note (Signed)
The patient continues to have worsening alopecia.  She does not think that the iron tablets have helped.  It is possible that some of her blood pressure and cardiac medications are exacerbating her problem.  There is also a family history of hair loss on her father's side.  We will try stopping her furosemide and see if this makes a difference.  If she has severe peripheral edema she may take an occasional Lasix but she will not be taking it on a regular basis

## 2013-10-22 ENCOUNTER — Ambulatory Visit (INDEPENDENT_AMBULATORY_CARE_PROVIDER_SITE_OTHER): Payer: Medicare Other | Admitting: Internal Medicine

## 2013-10-22 ENCOUNTER — Encounter: Payer: Self-pay | Admitting: Internal Medicine

## 2013-10-22 VITALS — BP 125/88 | HR 89 | Ht 64.5 in | Wt 125.8 lb

## 2013-10-22 DIAGNOSIS — I517 Cardiomegaly: Secondary | ICD-10-CM

## 2013-10-22 DIAGNOSIS — I495 Sick sinus syndrome: Secondary | ICD-10-CM

## 2013-10-22 DIAGNOSIS — Z950 Presence of cardiac pacemaker: Secondary | ICD-10-CM

## 2013-10-22 LAB — MDC_IDC_ENUM_SESS_TYPE_INCLINIC
Battery Impedance: 2061 Ohm
Battery Voltage: 2.7 V
Brady Statistic AP VP Percent: 0 %
Brady Statistic AP VS Percent: 59 %
Lead Channel Impedance Value: 566 Ohm
Lead Channel Pacing Threshold Amplitude: 0.75 V
Lead Channel Pacing Threshold Pulse Width: 0.4 ms
Lead Channel Pacing Threshold Pulse Width: 0.4 ms
Lead Channel Setting Pacing Amplitude: 2 V
Lead Channel Setting Pacing Amplitude: 2.5 V
Lead Channel Setting Pacing Pulse Width: 0.4 ms
Lead Channel Setting Sensing Sensitivity: 5.6 mV

## 2013-10-22 NOTE — Patient Instructions (Signed)
Your physician wants you to follow-up in: 12 months with Dr Jacquiline Doe will receive a reminder letter in the mail two months in advance. If you don't receive a letter, please call our office to schedule the follow-up appointment.   Remote monitoring is used to monitor your Pacemaker or ICD from home. This monitoring reduces the number of office visits required to check your device to one time per year. It allows [place] to keep an eye on the functioning of your device to ensure it is working properly. You are scheduled for a device check from home on 01/25/14. You may send your transmission at any time that day. If you have a wireless device, the transmission will be sent automatically. After your physician reviews your transmission, you will receive a postcard with your next transmission date.

## 2013-10-22 NOTE — Progress Notes (Signed)
PCP: [name] Givens, MD Primary Cardiologist:  Dr [name] is a 67 y.o. female who presents today for routine electrophysiology followup.  Since last being seen in our clinic, the patient reports doing very well.  Today, she denies symptoms of chest pain, shortness of breath,  lower extremity edema, dizziness, presyncope, or syncope  She has had some palpitations (lasting several seconds).  No arrhythmias on device interrogation today.  The patient is otherwise without complaint today.   Past Medical History  Diagnosis Date  . Colon polyp     adenomatous  . Hemorrhoids   . Inguinal hernia   . Hyperlipidemia   . Sleep apnea     can't use c-pap  . Allergic rhinitis   . GERD (gastroesophageal reflux disease)   . Bradycardia     s/p MDT PPM  . Atypical chest pain   . Hypertension   . Pacemaker   . Endometriosis   . Fibroid   . Skin cancer     Basal and squamous cell  . Anemia   . Arthritis   . Blood in stool   . History of chicken pox   . Depression   . Cardiac arrhythmia due to congenital heart disease   . Heart murmur   . Thyroid disease   . UTI (urinary tract infection)   . Osteopenia 12/2012    T score -1.7 FRAX 8.5%/1.1%   Past Surgical History  Procedure Laterality Date  . Thyroidectomy  1979    RIGHT SIDE  . Polypectomy    . Pacemaker insertion  2006    Dr. Reyes Ivan  . Abdominal hysterectomy    . Knee arthroscopy  2011  . Cesarean section    . Dilation and curettage of uterus    . Thumb surgery    . Oophorectomy  1995    LSO and RSO  . Tear duct surgery  2010  . Hand surgery  1992  . Inguinal hernia repair  02/12/1994    left inguinal hernia repair  . Hernia repair      Current Outpatient Prescriptions  Medication Sig Dispense Refill  . Acetaminophen (TYLENOL PO) Take 1 tablet by mouth as needed.       . diltiazem (CARDIZEM CD) 120 MG 24 hr capsule Take 1 capsule (120 mg total) by mouth daily.  30 capsule  5  . LORazepam (ATIVAN) 1 MG  tablet Take 1 tablet (1 mg total) by mouth every 6 (six) hours as needed.  120 tablet  2  . nitroGLYCERIN (NITROSTAT) 0.4 MG SL tablet Place 1 tablet (0.4 mg total) under the tongue every 5 (five) minutes as needed. For up to 3 doses  25 tablet  4  . nizatidine (AXID) 150 MG capsule Take 1 capsule (150 mg total) by mouth 2 (two) times daily.  60 capsule  11   No current facility-administered medications for this visit.    Physical Exam: Filed Vitals:   10/22/13 1524  BP: 125/88  Pulse: 89  Height: 5' 4.5" (1.638 m)  Weight: 125 lb 12.8 oz (57.063 kg)    GEN- The patient is well appearing, alert and oriented x 3 today.   Head- normocephalic, atraumatic Eyes-  Sclera clear, conjunctiva pink Ears- hearing intact Oropharynx- clear Lungs- Clear to ausculation bilaterally, normal work of breathing Chest- pacemaker pocket is well healed Heart- Regular rate and rhythm, no murmurs, rubs or gallops, PMI not laterally displaced GI- soft, NT, ND, + BS Extremities- no clubbing,  cyanosis, or edema  Pacemaker interrogation- reviewed in detail today,  See PACEART report  Assessment and Plan:  1. Symptomatic sinus bradycardia Normal pacemaker function See Pace Art report No changes today  2. HTN Stable No change required today  Carelink Return in 1 year

## 2013-11-10 ENCOUNTER — Ambulatory Visit (INDEPENDENT_AMBULATORY_CARE_PROVIDER_SITE_OTHER): Payer: Medicare Other | Admitting: Gynecology

## 2013-11-10 ENCOUNTER — Encounter: Payer: Self-pay | Admitting: Gynecology

## 2013-11-10 VITALS — BP 120/76 | Ht 65.0 in | Wt 125.0 lb

## 2013-11-10 DIAGNOSIS — N952 Postmenopausal atrophic vaginitis: Secondary | ICD-10-CM

## 2013-11-10 DIAGNOSIS — M8580 Other specified disorders of bone density and structure, unspecified site: Secondary | ICD-10-CM

## 2013-11-10 DIAGNOSIS — IMO0002 Reserved for concepts with insufficient information to code with codable children: Secondary | ICD-10-CM

## 2013-11-10 DIAGNOSIS — M899 Disorder of bone, unspecified: Secondary | ICD-10-CM

## 2013-11-10 MED ORDER — TERCONAZOLE 0.8 % VA CREA: 1.0000 | TOPICAL_CREAM | Freq: Every day | VAGINAL | Status: DC

## 2013-11-10 NOTE — Patient Instructions (Signed)
Follow up in one year, sooner as needed. 

## 2013-11-10 NOTE — Progress Notes (Addendum)
[name] 01/01/1946 [id number]        67 y.o.  G2P1011 for followup exam.  Former patient of Dr. Eda Paschal. Several issues noted below.  Past medical history,surgical history, problem list, medications, allergies, family history and social history were all reviewed and documented in the EPIC chart.  ROS:  Performed and pertinent positives and negatives are included in the history, assessment and plan .  Exam: Kim assistant Filed Vitals:   11/10/13 1501  BP: 120/76  Height: 5\' 5"  (1.651 m)  Weight: 125 lb (56.7 kg)   General appearance  Normal Skin grossly normal Head/Neck normal with no cervical or supraclavicular adenopathy thyroid normal Lungs  clear Cardiac RR, without RMG Abdominal  soft, nontender, without masses, organomegaly or hernia Breasts  examined lying and sitting without masses, retractions, discharge or axillary adenopathy. Pelvic  Ext/BUS/vagina  Normal with atrophic changes   Adnexa  Without masses or tenderness    Anus and perineum  Normal   Rectovaginal  Normal sphincter tone without palpated masses or tenderness.    Assessment/Plan:  67 y.o. G35P1011 female for annual exam.   1. Postmenopausal status post TAH BSO in the past for endometriosis and leiomyoma. History of significant atrophic vaginitis. Had been on HRT in the past but was discontinued secondary to cardiac concerns. Had trial of vaginal estrogen but did not tolerate it for irritative reaction. Reviewed OTC moisturizers and lubricants as well as possibility of reinitiating vaginal estrogen. Patient would prefer trying the lubricants for now. Will call me if she wants to consider vaginal estrogen. 2. Osteopenia. DEXA 12/2012 T score -1.7. FRAX 8.5%/1.1%. Check vitamin D level today. Increased vitamin D calcium reviewed. Repeat DEXA at two-year interval. 3. History of recurrent yeast infections. Dr. Eda Paschal have given her prescription for Terazol to have available in the event of recurrent yeast. I  prescribed one course with a 3 refills. Her medication alert shows contraindications to Terazol but patient states that she use uses this and has no allergic or adverse reaction to it. 4. Pap smear 2012. No Pap smear done today. No history of abnormal Pap smears previously. Option to stop screening altogether as she is status post hysterectomy for benign indications and a 66 versus less frequent screening intervals reviewed. Will readdress on an annual basis. 5. Mammography scheduled next week with [facility]. Continue with annual mammography. SBE monthly reviewed. 6. Colonoscopy 2013. Repeat at their recommended interval. 7. Health maintenance. Do routine blood work done this this is all done through her other physician's offices. Followup one year, sooner as needed.   Note: This document was prepared with digital dictation and possible smart phrase technology. Any transcriptional errors that result from this process are unintentional.   Dara Lords MD, 3:44 PM 11/10/2013

## 2013-11-11 LAB — URINALYSIS W MICROSCOPIC + REFLEX CULTURE
Bilirubin Urine: NEGATIVE
Crystals: NONE SEEN
Glucose, UA: NEGATIVE mg/dL
Leukocytes, UA: NEGATIVE
Protein, ur: NEGATIVE mg/dL
Specific Gravity, Urine: 1.019 (ref 1.005–1.030)
Squamous Epithelial / LPF: NONE SEEN
Urobilinogen, UA: 0.2 mg/dL (ref 0.0–1.0)

## 2013-11-11 LAB — VITAMIN D 25 HYDROXY (VIT D DEFICIENCY, FRACTURES): Vit D, 25-Hydroxy: 32 ng/mL (ref 30–89)

## 2013-11-12 ENCOUNTER — Other Ambulatory Visit: Payer: Self-pay | Admitting: Cardiology

## 2013-11-13 ENCOUNTER — Other Ambulatory Visit: Payer: Self-pay | Admitting: Cardiology

## 2013-11-16 ENCOUNTER — Other Ambulatory Visit: Payer: Self-pay | Admitting: Cardiology

## 2013-12-23 ENCOUNTER — Ambulatory Visit
Admission: RE | Admit: 2013-12-23 | Discharge: 2013-12-23 | Disposition: A | Discharge status: Home / Self Care | Payer: Medicare Other | Source: Ambulatory Visit

## 2013-12-23 DIAGNOSIS — Z1231 Encounter for screening mammogram for malignant neoplasm of breast: Secondary | ICD-10-CM

## 2013-12-25 ENCOUNTER — Other Ambulatory Visit: Payer: Self-pay | Admitting: Gynecology

## 2013-12-25 DIAGNOSIS — R928 Other abnormal and inconclusive findings on diagnostic imaging of breast: Secondary | ICD-10-CM

## 2014-01-01 ENCOUNTER — Other Ambulatory Visit: Payer: Self-pay | Admitting: *Deleted

## 2014-01-01 DIAGNOSIS — I119 Hypertensive heart disease without heart failure: Secondary | ICD-10-CM

## 2014-01-01 MED ORDER — DILTIAZEM HCL ER COATED BEADS 120 MG PO CP24
120.0000 mg | ORAL_CAPSULE | Freq: Every day | ORAL | Status: DC
Start: 2014-01-01 — End: 2014-03-02

## 2014-01-06 ENCOUNTER — Ambulatory Visit
Admission: RE | Admit: 2014-01-06 | Discharge: 2014-01-06 | Disposition: A | Discharge status: Home / Self Care | Payer: Medicare Other | Source: Ambulatory Visit | Attending: Gynecology | Admitting: Gynecology

## 2014-01-06 ENCOUNTER — Other Ambulatory Visit: Payer: Self-pay | Admitting: Gynecology

## 2014-01-06 DIAGNOSIS — R921 Mammographic calcification found on diagnostic imaging of breast: Secondary | ICD-10-CM

## 2014-01-06 DIAGNOSIS — R928 Other abnormal and inconclusive findings on diagnostic imaging of breast: Secondary | ICD-10-CM

## 2014-01-12 ENCOUNTER — Ambulatory Visit
Admission: RE | Admit: 2014-01-12 | Discharge: 2014-01-12 | Disposition: A | Discharge status: Home / Self Care | Payer: Self-pay | Source: Ambulatory Visit | Attending: Gynecology | Admitting: Gynecology

## 2014-01-12 DIAGNOSIS — R921 Mammographic calcification found on diagnostic imaging of breast: Secondary | ICD-10-CM

## 2014-01-15 HISTORY — PX: BREAST BIOPSY: SHX20

## 2014-01-25 ENCOUNTER — Ambulatory Visit (INDEPENDENT_AMBULATORY_CARE_PROVIDER_SITE_OTHER): Payer: Medicare Other | Admitting: *Deleted

## 2014-01-25 ENCOUNTER — Other Ambulatory Visit (INDEPENDENT_AMBULATORY_CARE_PROVIDER_SITE_OTHER): Payer: Medicare Other

## 2014-01-25 DIAGNOSIS — E785 Hyperlipidemia, unspecified: Secondary | ICD-10-CM

## 2014-01-25 DIAGNOSIS — I495 Sick sinus syndrome: Secondary | ICD-10-CM

## 2014-01-25 LAB — LDL CHOLESTEROL, DIRECT: Direct LDL: 146.6 mg/dL

## 2014-01-25 LAB — MDC_IDC_ENUM_SESS_TYPE_REMOTE
Battery Impedance: 2755 Ohm
Brady Statistic AP VP Percent: 0 %
Brady Statistic AP VS Percent: 59 %
Brady Statistic AS VS Percent: 41 %
Date Time Interrogation Session: 20150223142709
Lead Channel Impedance Value: 526 Ohm
Lead Channel Impedance Value: 605 Ohm
Lead Channel Pacing Threshold Amplitude: 0.75 V
Lead Channel Pacing Threshold Pulse Width: 0.4 ms
Lead Channel Pacing Threshold Pulse Width: 0.4 ms
Lead Channel Sensing Intrinsic Amplitude: 2.8 mV
Lead Channel Setting Sensing Sensitivity: 5.6 mV
MDC IDC MSMT BATTERY REMAINING LONGEVITY: 18 mo
MDC IDC MSMT BATTERY VOLTAGE: 2.7 V
MDC IDC MSMT LEADCHNL RA PACING THRESHOLD AMPLITUDE: 0.75 V
MDC IDC MSMT LEADCHNL RV SENSING INTR AMPL: 16 mV
MDC IDC SET LEADCHNL RA PACING AMPLITUDE: 2 V
MDC IDC SET LEADCHNL RV PACING AMPLITUDE: 2.5 V
MDC IDC SET LEADCHNL RV PACING PULSEWIDTH: 0.4 ms
MDC IDC STAT BRADY AS VP PERCENT: 0 %

## 2014-01-25 LAB — LIPID PANEL
Cholesterol: 224 mg/dL — ABNORMAL HIGH (ref 0–200)
HDL: 64.7 mg/dL (ref >39.00)
Total CHOL/HDL Ratio: 3
Triglycerides: 77 mg/dL (ref 0.0–149.0)
VLDL: 15.4 mg/dL (ref 0.0–40.0)

## 2014-01-25 LAB — HEPATIC FUNCTION PANEL
ALT: 17 U/L (ref 0–35)
AST: 16 U/L (ref 0–37)
Albumin: 4.2 g/dL (ref 3.5–5.2)
Alkaline Phosphatase: 43 U/L (ref 39–117)
BILIRUBIN DIRECT: 0 mg/dL (ref 0.0–0.3)
Total Bilirubin: 0.7 mg/dL (ref 0.3–1.2)
Total Protein: 7 g/dL (ref 6.0–8.3)

## 2014-01-25 LAB — BASIC METABOLIC PANEL
BUN: 15 mg/dL (ref 6–23)
CO2: 29 mEq/L (ref 19–32)
CREATININE: 0.9 mg/dL (ref 0.4–1.2)
Calcium: 9.3 mg/dL (ref 8.4–10.5)
Chloride: 104 mEq/L (ref 96–112)
GFR: 70.13 mL/min (ref >60.00)
GLUCOSE: 98 mg/dL (ref 70–99)
Potassium: 3.9 mEq/L (ref 3.5–5.1)
Sodium: 139 mEq/L (ref 135–145)

## 2014-01-25 NOTE — Progress Notes (Signed)
Quick Note:  Please make copy of labs for patient visit. ______ 

## 2014-01-27 ENCOUNTER — Encounter (INDEPENDENT_AMBULATORY_CARE_PROVIDER_SITE_OTHER): Payer: Self-pay

## 2014-01-27 ENCOUNTER — Encounter: Payer: Self-pay | Admitting: Cardiology

## 2014-01-27 ENCOUNTER — Ambulatory Visit (INDEPENDENT_AMBULATORY_CARE_PROVIDER_SITE_OTHER): Payer: Medicare Other | Admitting: Cardiology

## 2014-01-27 VITALS — BP 126/84 | HR 86 | Ht 65.0 in | Wt 122.0 lb

## 2014-01-27 DIAGNOSIS — K219 Gastro-esophageal reflux disease without esophagitis: Secondary | ICD-10-CM

## 2014-01-27 DIAGNOSIS — L659 Nonscarring hair loss, unspecified: Secondary | ICD-10-CM

## 2014-01-27 DIAGNOSIS — M179 Osteoarthritis of knee, unspecified: Secondary | ICD-10-CM

## 2014-01-27 DIAGNOSIS — I495 Sick sinus syndrome: Secondary | ICD-10-CM

## 2014-01-27 DIAGNOSIS — I119 Hypertensive heart disease without heart failure: Secondary | ICD-10-CM

## 2014-01-27 DIAGNOSIS — E785 Hyperlipidemia, unspecified: Secondary | ICD-10-CM

## 2014-01-27 DIAGNOSIS — M1710 Unilateral primary osteoarthritis, unspecified knee: Secondary | ICD-10-CM

## 2014-01-27 DIAGNOSIS — IMO0002 Reserved for concepts with insufficient information to code with codable children: Secondary | ICD-10-CM

## 2014-01-27 MED ORDER — NIZATIDINE 150 MG PO CAPS: 150.0000 mg | ORAL_CAPSULE | Freq: Two times a day (BID) | ORAL | Status: DC

## 2014-01-27 NOTE — Assessment & Plan Note (Signed)
The patient has not been aware of any racing of her heart or palpitations.

## 2014-01-27 NOTE — Patient Instructions (Signed)
Your physician recommends that you continue on your current medications as directed. Please refer to the Current Medication list given to you today.  Your physician recommends that you schedule a follow-up appointment in: 4 months with fasting labs (lp/bmet/hfp/LAB AHEAD)

## 2014-01-27 NOTE — Assessment & Plan Note (Signed)
The patient's alopecia has improved slightly since the addition of multivitamin with iron.

## 2014-01-27 NOTE — Assessment & Plan Note (Signed)
The patient has osteoarthritis of her right knee.  Dr. Ronnie Derby is her orthopedist.  She feels that she will eventually need total knee replacement.  She is not able to do much walking exercise because of right knee pain but is able to use an exercise bike which she has available at home.  I have encouraged her to use it.

## 2014-01-27 NOTE — Progress Notes (Signed)
[name] Date of Birth:  1945/12/18 [address] [name], [name]  [address] [phone]         Fax   [phone]  History of Present Illness: This pleasant 68 year old woman is seen for a scheduled followup office visit. He has a past history of essential hypertension and a history of symptomatic palpitations. She also has a functioning dual-chamber pacemaker implanted by Dr. Verlon Setting in about 2006. The patient has a history of atypical chest pain. She does not have any history of known ischemic heart disease. She does have a history of gastroesophageal reflux disease. She underwent upper endoscopy and colonoscopy in June 2014 by her gastroenterologist Dr. Fuller Plan. A few colonic polyps were found and she will need another colonoscopy in 5 years. The patient has been experiencing occasional palpitations.  The patient has been experiencing some alopecia.  She has seen her dermatologist.  He checked her iron level and recommended trying a multivitamin with iron.  Since last visit she has had no new cardiac symptoms   Current Outpatient Prescriptions  Medication Sig Dispense Refill  . Acetaminophen (TYLENOL PO) Take 1 tablet by mouth as needed.       . diltiazem (CARDIZEM CD) 120 MG 24 hr capsule Take 1 capsule (120 mg total) by mouth daily.  30 capsule  1  . LORazepam (ATIVAN) 1 MG tablet TAKE 1 TABLET FOUR TIMES A DAY AS NEEDED  120 tablet  0  . nitroGLYCERIN (NITROSTAT) 0.4 MG SL tablet Place 1 tablet (0.4 mg total) under the tongue every 5 (five) minutes as needed. For up to 3 doses  25 tablet  4  . nizatidine (AXID) 150 MG capsule Take 1 capsule (150 mg total) by mouth 2 (two) times daily.  180 capsule  3  . terconazole (TERAZOL 3) 0.8 % vaginal cream Place 1 applicator vaginally at bedtime. For 3 nights  20 g  3   No current facility-administered medications for this visit.    Allergies  Allergen Reactions  . Antihistamines, Loratadine-Type   .  Bisoprolol-Hydrochlorothiazide     Per pt: unknown  . Bystolic [Nebivolol Hcl]     Per pt: unknown  . Cephalexin Nausea And Vomiting  . Codeine     Increased heart rate, anxiety  . Demerol [Meperidine]     Increased heart rate, anxiety  . Epinephrine     Increased heart rate  . Erythromycin     REACTION: nausea  . Hyoscyamine Sulfate     Per pt: unknown  . Inderal [Propranolol]     Extreme tiredness  . Lidocaine     Family hx of malignant hyperthermia  . Lipitor [Atorvastatin Calcium]     aches  . Lipitor [Atorvastatin]     Muscle aches  . Losartan Potassium     Per pt: unknown  . Morphine     Nightmares, increased BP  . Paxil [Paroxetine Hcl]     Per pt: unknown  . Penicillins Nausea And Vomiting  . Prednisone     Restless  . Propranolol Hcl     Per pt: unknown  . Sertraline Hcl     Per pt: unknown  . Tetracycline Nausea And Vomiting  . Toprol Xl [Metoprolol Succinate]     Extreme tiredness  . Verapamil     Per pt: unknown    Patient Active Problem List   Diagnosis Date Noted  . HYPERLIPIDEMIA 03/30/2008    Priority: High  . Osteoarthritis of knee  04/19/2011    Priority: Medium  . CHEST PAIN 06/28/2009    Priority: Medium  . Rapid palpitations 09/23/2013  . Gastroenteritis 08/07/2013  . Fatigue 08/07/2013  . Alopecia 05/11/2013  . Benign hypertensive heart disease without heart failure 01/07/2013  . URI (upper respiratory infection) 10/27/2012  . Pacemaker-Medtronic 10/08/2012  . Hiatal hernia 05/28/2012  . Esophageal stricture 05/28/2012  . Dysuria 04/03/2012  . Osteoporosis screening 01/16/2012  . Breast mass 01/16/2012  . Anxiety 01/16/2012  . Sick sinus syndrome   . Internal hemorrhoids 02/28/2011  . Rectal or anal pain 02/28/2011  . History of colon polyps 02/28/2011  . Constipation 02/28/2011  . GERD 06/28/2009  . COLONIC POLYPS, ADENOMATOUS, HX OF 06/24/2009  . HEMORRHOIDS 03/30/2008  . SLEEP APNEA 03/30/2008  . History of SCC (squamous  cell carcinoma) of skin 03/30/2008    History  Smoking status  . Never Smoker   Smokeless tobacco  . Never Used    History  Alcohol Use No    Family History  Problem Relation Age of Onset  . Colon cancer Neg Hx   . Heart failure Sister   . Heart disease Sister   . Hypertension Sister   . Dementia Mother   . Hypertension Mother   . Hypertension Father   . Arthritis Father   . Breast cancer Maternal Aunt     Age 67's  . Diabetes Maternal Aunt   . Diabetes Maternal Uncle   . Ovarian cancer Maternal Aunt     Review of Systems: Constitutional: no fever chills diaphoresis or fatigue or change in weight.  Head and neck: no hearing loss, no epistaxis, no photophobia or visual disturbance. Respiratory: No cough, shortness of breath or wheezing. Cardiovascular: No chest pain peripheral edema, palpitations. Gastrointestinal: No abdominal distention, no abdominal pain, no change in bowel habits hematochezia or melena. Genitourinary: No dysuria, no frequency, no urgency, no nocturia. Musculoskeletal:No arthralgias, no back pain, no gait disturbance or myalgias. Neurological: No dizziness, no headaches, no numbness, no seizures, no syncope, no weakness, no tremors. Hematologic: No lymphadenopathy, no easy bruising. Psychiatric: No confusion, no hallucinations, no sleep disturbance.    Physical Exam: Filed Vitals:   01/27/14 1121  BP: 126/84  Pulse: 86   the general appearance reveals a well-developed well-nourished woman in no distress.The head and neck exam reveals pupils equal and reactive.  Extraocular movements are full.  There is no scleral icterus.  The mouth and pharynx are normal.  The neck is supple.  The carotids reveal no bruits.  The jugular venous pressure is normal.  The  thyroid is not enlarged.  There is no lymphadenopathy.  The chest is clear to percussion and auscultation.  There are no rales or rhonchi.  Expansion of the chest is symmetrical.  The precordium is  quiet.  The first heart sound is normal.  The second heart sound is physiologically split.  There is no murmur gallop rub or click.  There is no abnormal lift or heave.  The abdomen is soft and nontender.  The bowel sounds are normal.  The liver and spleen are not enlarged.  There are no abdominal masses.  There are no abdominal bruits.  Extremities reveal good pedal pulses.  There is no phlebitis or edema.  There is no cyanosis or clubbing.  Strength is normal and symmetrical in all extremities.  There is no lateralizing weakness.  There are no sensory deficits.  The skin is warm and dry.  There is no rash.  Her scalp reveals thinning of the hair.     Assessment / Plan: The patient will continue same medication.  Work harder on aerobic exercise using an exercise bicycle.  Recheck in 4 months for office visit and fasting lipid panel hepatic function panel and basal metabolic panel ahead of time.

## 2014-01-27 NOTE — Assessment & Plan Note (Signed)
Blood pressure has been remaining stable on current therapy.  She has not been having symptoms of CHF.  No dizziness or syncope.

## 2014-01-27 NOTE — Assessment & Plan Note (Signed)
The patient's LDL is still higher than we would desire.  Increased aerobic exercise will help this.  Continue same meds.

## 2014-02-01 ENCOUNTER — Encounter: Payer: Self-pay | Admitting: *Deleted

## 2014-02-08 ENCOUNTER — Encounter: Payer: Self-pay | Admitting: Internal Medicine

## 2014-02-28 ENCOUNTER — Other Ambulatory Visit: Payer: Self-pay | Admitting: Cardiology

## 2014-03-02 ENCOUNTER — Other Ambulatory Visit: Payer: Self-pay | Admitting: Cardiology

## 2014-03-02 ENCOUNTER — Telehealth: Payer: Self-pay | Admitting: Cardiology

## 2014-03-02 DIAGNOSIS — I119 Hypertensive heart disease without heart failure: Secondary | ICD-10-CM

## 2014-03-02 MED ORDER — DILTIAZEM HCL ER COATED BEADS 120 MG PO CP24: 120.0000 mg | ORAL_CAPSULE | Freq: Every day | ORAL | Status: DC

## 2014-03-02 NOTE — Telephone Encounter (Signed)
Refilled diltiazem as requested 

## 2014-03-02 NOTE — Telephone Encounter (Signed)
New message     Talk to a nurse about presc problems

## 2014-04-23 ENCOUNTER — Institutional Professional Consult (permissible substitution): Payer: Medicare Other | Admitting: Internal Medicine

## 2014-04-28 ENCOUNTER — Ambulatory Visit (INDEPENDENT_AMBULATORY_CARE_PROVIDER_SITE_OTHER): Payer: Medicare Other | Admitting: *Deleted

## 2014-04-28 ENCOUNTER — Encounter: Payer: Self-pay | Admitting: Internal Medicine

## 2014-04-28 DIAGNOSIS — I495 Sick sinus syndrome: Secondary | ICD-10-CM

## 2014-04-28 LAB — MDC_IDC_ENUM_SESS_TYPE_REMOTE
Battery Impedance: 3049 Ohm
Battery Remaining Longevity: 16 mo
Battery Voltage: 2.69 V
Brady Statistic AS VP Percent: 0 %
Lead Channel Impedance Value: 540 Ohm
Lead Channel Pacing Threshold Amplitude: 0.625 V
Lead Channel Pacing Threshold Amplitude: 0.875 V
Lead Channel Sensing Intrinsic Amplitude: 22.4 mV
Lead Channel Setting Pacing Amplitude: 2 V
Lead Channel Setting Pacing Pulse Width: 0.4 ms
Lead Channel Setting Sensing Sensitivity: 5.6 mV
MDC IDC MSMT LEADCHNL RA PACING THRESHOLD PULSEWIDTH: 0.4 ms
MDC IDC MSMT LEADCHNL RA SENSING INTR AMPL: 2.8 mV
MDC IDC MSMT LEADCHNL RV IMPEDANCE VALUE: 589 Ohm
MDC IDC MSMT LEADCHNL RV PACING THRESHOLD PULSEWIDTH: 0.4 ms
MDC IDC SESS DTM: 20150527123742
MDC IDC SET LEADCHNL RV PACING AMPLITUDE: 2.5 V
MDC IDC STAT BRADY AP VP PERCENT: 0 %
MDC IDC STAT BRADY AP VS PERCENT: 59 %
MDC IDC STAT BRADY AS VS PERCENT: 41 %

## 2014-04-28 NOTE — Progress Notes (Signed)
Remote pacemaker transmission.   

## 2014-05-14 ENCOUNTER — Encounter: Payer: Self-pay | Admitting: Cardiology

## 2014-05-17 ENCOUNTER — Other Ambulatory Visit: Payer: Medicare Other

## 2014-05-17 ENCOUNTER — Encounter: Payer: Self-pay | Admitting: Cardiology

## 2014-05-19 ENCOUNTER — Ambulatory Visit (INDEPENDENT_AMBULATORY_CARE_PROVIDER_SITE_OTHER): Payer: Medicare Other | Admitting: Cardiology

## 2014-05-19 ENCOUNTER — Encounter: Payer: Self-pay | Admitting: Cardiology

## 2014-05-19 ENCOUNTER — Telehealth: Payer: Self-pay | Admitting: *Deleted

## 2014-05-19 VITALS — BP 136/80 | HR 84 | Ht 65.0 in | Wt 126.0 lb

## 2014-05-19 DIAGNOSIS — G4730 Sleep apnea, unspecified: Secondary | ICD-10-CM

## 2014-05-19 DIAGNOSIS — E785 Hyperlipidemia, unspecified: Secondary | ICD-10-CM

## 2014-05-19 DIAGNOSIS — R079 Chest pain, unspecified: Secondary | ICD-10-CM

## 2014-05-19 DIAGNOSIS — I119 Hypertensive heart disease without heart failure: Secondary | ICD-10-CM

## 2014-05-19 DIAGNOSIS — I495 Sick sinus syndrome: Secondary | ICD-10-CM

## 2014-05-19 NOTE — Assessment & Plan Note (Signed)
The patient notes occasional very brief irregular heartbeat lasting just a few seconds.  She has not had any sustained tachycardia or arrhythmia.

## 2014-05-19 NOTE — Patient Instructions (Signed)
Your physician recommends that you continue on your current medications as directed. Please refer to the Current Medication list given to you today.   Your physician wants you to follow-up in: 4 months.   You will receive a reminder letter in the mail two months in advance. If you don't receive a letter, please call our office to schedule the follow-up appointment @ [phone].

## 2014-05-19 NOTE — Assessment & Plan Note (Signed)
Her sleep apnea has become worse since she no longer sleeps on her side as much as she used to.  She is not presently using a CPAP machine.  She will be seeing her pulmonologist Dr. Annamaria Boots later this week.

## 2014-05-19 NOTE — Telephone Encounter (Signed)
S/w pt is aware of lab results that were taken at [facility].  Will be scanned in the system

## 2014-05-19 NOTE — Assessment & Plan Note (Signed)
The patient has a past history of occasional chest tightness.  She has read that heart disease in women sometimes presents in a different manner.  She wonders how she would know if her discomfort is from her heart or whether it is something noncardiac.  In the past she has had negative workup for myocardial ischemia.  She does carry sublingual nitroglycerin.  I suggested that when she experiences her symptoms next time she should try taking 1 over sublingual nitroglycerin.  She has never open them.  They are good until March of 2016.

## 2014-05-19 NOTE — Progress Notes (Signed)
[name] Date of Birth:  1946-09-21 [facility] [address] [place], [place]  [address] [phone]        Fax   [phone]   History of Present Illness: This pleasant 68 year old woman is seen for a scheduled followup office visit. He has a past history of essential hypertension and a history of symptomatic palpitations. She also has a functioning dual-chamber pacemaker implanted by Dr. Verlon Setting in about 2006. The patient has a history of atypical chest pain. She does not have any history of known ischemic heart disease. She does have a history of gastroesophageal reflux disease. She underwent upper endoscopy and colonoscopy in June 2014 by her gastroenterologist Dr. Fuller Plan. A few colonic polyps were found and she will need another colonoscopy in 5 years. The patient has been experiencing occasional palpitations. The patient has been experiencing some alopecia. She has seen her dermatologist. He checked her iron level and recommended trying a multivitamin with iron. Since last visit she has had no new cardiac symptoms.  She does complain of lack of energy and intends to try a nutritional supplement that one of her friends has tried.  The supplement does not contain caffeine. The patient has a history of sleep apnea.  She has an appointment to see Dr. Annamaria Boots later this week.  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   Current Outpatient Prescriptions  Medication Sig Dispense Refill  . Acetaminophen (TYLENOL PO) Take 1 tablet by mouth as needed.       . diltiazem (CARDIZEM CD) 120 MG 24 hr capsule Take 1 capsule (120 mg total) by mouth daily.  30 capsule  11  . LORazepam (ATIVAN) 1 MG tablet TAKE 1 TABLET FOUR TIMES A DAY AS NEEDED  120 tablet  2  . nitroGLYCERIN (NITROSTAT) 0.4 MG SL tablet Place 1 tablet (0.4 mg total) under the tongue every 5 (five) minutes as needed. For up to 3 doses  25 tablet   4  . nizatidine (AXID) 150 MG capsule Take 1 capsule (150 mg total) by mouth 2 (two) times daily.  180 capsule  3  . terconazole (TERAZOL 3) 0.8 % vaginal cream Place 1 applicator vaginally at bedtime. For 3 nights  20 g  3   No current facility-administered medications for this visit.    Allergies  Allergen Reactions  . Antihistamines, Loratadine-Type   . Bisoprolol-Hydrochlorothiazide     Per pt: unknown  . Bystolic [Nebivolol Hcl]     Per pt: unknown  . Cephalexin Nausea And Vomiting  . Codeine     Increased heart rate, anxiety  . Demerol [Meperidine]     Increased heart rate, anxiety  . Epinephrine     Increased heart rate  . Erythromycin     REACTION: nausea  . Hyoscyamine Sulfate     Per pt: unknown  . Inderal [Propranolol]     Extreme tiredness  . Lidocaine     Family hx of malignant hyperthermia  . Lipitor [Atorvastatin Calcium]     aches  . Lipitor [Atorvastatin]     Muscle aches  . Losartan Potassium     Per pt: unknown  . Morphine     Nightmares, increased BP  . Paxil [Paroxetine Hcl]     Per pt: unknown  . Penicillins Nausea And Vomiting  . Prednisone     Restless  . Propranolol Hcl  Per pt: unknown  . Sertraline Hcl     Per pt: unknown  . Tetracycline Nausea And Vomiting  . Toprol Xl [Metoprolol Succinate]     Extreme tiredness  . Verapamil     Per pt: unknown    Patient Active Problem List   Diagnosis Date Noted  . HYPERLIPIDEMIA 03/30/2008    Priority: High  . Osteoarthritis of knee 04/19/2011    Priority: Medium  . CHEST PAIN 06/28/2009    Priority: Medium  . Rapid palpitations 09/23/2013  . Gastroenteritis 08/07/2013  . Fatigue 08/07/2013  . Alopecia 05/11/2013  . Benign hypertensive heart disease without heart failure 01/07/2013  . URI (upper respiratory infection) 10/27/2012  . Pacemaker-Medtronic 10/08/2012  . Hiatal hernia 05/28/2012  . Esophageal stricture 05/28/2012  . Dysuria 04/03/2012  . Osteoporosis screening  01/16/2012  . Breast mass 01/16/2012  . Anxiety 01/16/2012  . Sick sinus syndrome   . Internal hemorrhoids 02/28/2011  . Rectal or anal pain 02/28/2011  . History of colon polyps 02/28/2011  . Constipation 02/28/2011  . GERD 06/28/2009  . COLONIC POLYPS, ADENOMATOUS, HX OF 06/24/2009  . HEMORRHOIDS 03/30/2008  . SLEEP APNEA 03/30/2008  . History of SCC (squamous cell carcinoma) of skin 03/30/2008    History  Smoking status  . Never Smoker   Smokeless tobacco  . Never Used    History  Alcohol Use No    Family History  Problem Relation Age of Onset  . Colon cancer Neg Hx   . Heart failure Sister   . Heart disease Sister   . Hypertension Sister   . Dementia Mother   . Hypertension Mother   . Hypertension Father   . Arthritis Father   . Breast cancer Maternal Aunt     Age 51's  . Diabetes Maternal Aunt   . Diabetes Maternal Uncle   . Ovarian cancer Maternal Aunt     Review of Systems: Constitutional: no fever chills diaphoresis or fatigue or change in weight.  Head and neck: no hearing loss, no epistaxis, no photophobia or visual disturbance. Respiratory: No cough, shortness of breath or wheezing. Cardiovascular: No chest pain peripheral edema, palpitations. Gastrointestinal: No abdominal distention, no abdominal pain, no change in bowel habits hematochezia or melena. Genitourinary: No dysuria, no frequency, no urgency, no nocturia. Musculoskeletal:No arthralgias, no back pain, no gait disturbance or myalgias. Neurological: No dizziness, no headaches, no numbness, no seizures, no syncope, no weakness, no tremors. Hematologic: No lymphadenopathy, no easy bruising. Psychiatric: No confusion, no hallucinations, no sleep disturbance.    Physical Exam: Filed Vitals:   05/19/14 1105  BP: 136/80  Pulse:    the general appearance reveals a well-developed well-nourished woman in no distress.The head and neck exam reveals pupils equal and reactive.  Extraocular  movements are full.  There is no scleral icterus.  The mouth and pharynx are normal.  The neck is supple.  The carotids reveal no bruits.  The jugular venous pressure is normal.  The  thyroid is not enlarged.  There is no lymphadenopathy.  The chest is clear to percussion and auscultation.  There are no rales or rhonchi.  Expansion of the chest is symmetrical.  The precordium is quiet.  The first heart sound is normal.  The second heart sound is physiologically split.  There is no murmur gallop rub or click.  There is no abnormal lift or heave.  The abdomen is soft and nontender.  The bowel sounds are normal.  The liver and spleen  are not enlarged.  There are no abdominal masses.  There are no abdominal bruits.  Extremities reveal good pedal pulses.  There is no phlebitis or edema.  There is no cyanosis or clubbing.  Strength is normal and symmetrical in all extremities.  There is no lateralizing weakness.  There are no sensory deficits.  The skin is warm and dry.  There is no rash.     Assessment / Plan: 1. benign hypertensive heart disease without heart failure 2. Hyperlipidemia 3. sleep apnea 4. atypical chest discomfort 5. sick sinus syndrome with functioning dual-chamber pacemaker. 6. GERD 7. alopecia unknown cause  Plan: Continue same medication.  Lab work done earlier this week is still pending. Recheck in 4 months for office visit lipid panel hepatic function panel and basal metabolic panel

## 2014-05-19 NOTE — Assessment & Plan Note (Signed)
Her blood pressure is slightly higher today than usual.  She attributes that to having been hosting her four grandchildren for the past 2 weeks.  She is not having any headaches or dizzy spells.

## 2014-05-21 ENCOUNTER — Encounter: Payer: Self-pay | Admitting: Internal Medicine

## 2014-05-21 ENCOUNTER — Ambulatory Visit (INDEPENDENT_AMBULATORY_CARE_PROVIDER_SITE_OTHER): Payer: Medicare Other | Admitting: Internal Medicine

## 2014-05-21 VITALS — BP 140/78 | HR 68 | Ht 64.5 in | Wt 125.0 lb

## 2014-05-21 DIAGNOSIS — G4733 Obstructive sleep apnea (adult) (pediatric): Secondary | ICD-10-CM

## 2014-05-21 DIAGNOSIS — G4730 Sleep apnea, unspecified: Secondary | ICD-10-CM

## 2014-05-21 DIAGNOSIS — I498 Other specified cardiac arrhythmias: Secondary | ICD-10-CM

## 2014-05-21 NOTE — Patient Instructions (Signed)
Order- schedule split protocol NPSG    Dx OSA 

## 2014-05-21 NOTE — Assessment & Plan Note (Signed)
Pacemaker, managed by cardiology

## 2014-05-21 NOTE — Assessment & Plan Note (Signed)
Symptoms of obstructive sleep apnea with snoring and daytime tiredness. She agrees to reevaluation with a new sleep study. We talked briefly about alternative treatments.

## 2014-05-21 NOTE — Progress Notes (Signed)
05/21/14- 67 yoF never smoker-COMPLAINS OF: Old pt of CDY-Hx of OSA,  Diagnosed obstructive sleep apnea 2004, AHI 18 per hour. She did not tolerate CPAP. She looked into an oral appliance it was too expensive. Husband now complains that she gasps and snores loudly in her sleep. Arthritis limits sleep positions. Sleep is unrefreshing and she is sleepy if she sits quietly. Bedtime 10:30 or 11:30 PM, short latency, waking 4 or5 times before up around 7:30 AM. Occasional lorazepam for sleep. ENT surgery for repair of lacrimal duct. History of deviated septum. Partial thyroidectomy. Not much seasonal rhinitis. Pacemaker for palpitations.  Prior to Admission medications   Medication Sig Start Date End Date Taking? Authorizing Provider  Acetaminophen (TYLENOL PO) Take 1 tablet by mouth as needed.    Yes Historical Provider, MD  diltiazem (CARDIZEM CD) 120 MG 24 hr capsule Take 1 capsule (120 mg total) by mouth daily. 03/02/14  Yes Darlin Coco, MD  LORazepam (ATIVAN) 1 MG tablet TAKE 1 TABLET FOUR TIMES A DAY AS NEEDED   Yes Darlin Coco, MD  nitroGLYCERIN (NITROSTAT) 0.4 MG SL tablet Place 1 tablet (0.4 mg total) under the tongue every 5 (five) minutes as needed. For up to 3 doses 09/03/12  Yes Darlin Coco, MD  nizatidine (AXID) 150 MG capsule Take 1 capsule (150 mg total) by mouth 2 (two) times daily. 01/27/14  Yes Darlin Coco, MD  terconazole (TERAZOL 3) 0.8 % vaginal cream Place 1 applicator vaginally at bedtime. For 3 nights 11/10/13  Yes Anastasio Auerbach, MD   Past Medical History  Diagnosis Date  . Colon polyp     adenomatous  . Hemorrhoids   . Inguinal hernia   . Hyperlipidemia   . Sleep apnea     can't use c-pap  . Allergic rhinitis   . GERD (gastroesophageal reflux disease)   . Bradycardia     s/p MDT PPM  . Atypical chest pain   . Hypertension   . Pacemaker   . Skin cancer     Basal and squamous cell  . Anemia   . Arthritis   . Blood in stool   . History of  chicken pox   . Depression   . Cardiac arrhythmia due to congenital heart disease   . Heart murmur   . Thyroid disease   . UTI (urinary tract infection)   . Osteopenia 12/2012    T score -1.7 FRAX 8.5%/1.1%  . Eye problem     Vitrious detachment   Past Surgical History  Procedure Laterality Date  . Thyroidectomy  1979    RIGHT SIDE  . Polypectomy    . Pacemaker insertion  2006    Dr. Verlon Setting  . Abdominal hysterectomy    . Knee arthroscopy  2011  . Cesarean section    . Dilation and curettage of uterus    . Thumb surgery    . Oophorectomy  1995    LSO and RSO  . Tear duct surgery  2010  . Hand surgery  1992  . Inguinal hernia repair  02/12/1994    left inguinal hernia repair  . Hernia repair     Family History  Problem Relation Age of Onset  . Colon cancer Neg Hx   . Heart failure Sister   . Heart disease Sister   . Hypertension Sister   . Dementia Mother   . Hypertension Mother   . Hypertension Father   . Arthritis Father   . Breast cancer Maternal Aunt  Age 72's  . Diabetes Maternal Aunt   . Diabetes Maternal Uncle   . Ovarian cancer Maternal Aunt    History   Social History  . Marital Status: Married    Spouse Name: N/A    Number of Children: 1  . Years of Education: N/A   Occupational History  . Retired    Social History Main Topics  . Smoking status: Never Smoker   . Smokeless tobacco: Never Used  . Alcohol Use: No  . Drug Use: No  . Sexual Activity: Not Currently    Birth Control/ Protection: Surgical, Post-menopausal     Comment: HYST   Other Topics Concern  . 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 HEENT:   No-  headaches, difficulty swallowing, tooth/dental problems, sore throat,       No-  sneezing, itching, ear ache, nasal congestion,  post nasal drip,  CV:  No-   chest pain, orthopnea, PND, swelling in lower extremities, anasarca,                                  dizziness, palpitations Resp: No-   shortness of breath with exertion or at rest.              No-   productive cough,  No non-productive cough,  No- coughing up of blood.              No-   change in color of mucus.  No- wheezing.   Skin: No-   rash or lesions. GI:  No-   heartburn, indigestion, abdominal pain, nausea, vomiting, diarrhea,                 change in bowel habits, loss of appetite GU: No-   dysuria, change in color of urine, no urgency or frequency.  No- flank pain. MS:  No-   joint pain or swelling.  No- decreased range of motion.  No- back pain. Neuro-     nothing unusual Psych:  No- change in mood or affect. No depression or anxiety.  No memory loss.  OBJ- Physical Exam General- Alert, Oriented, Affect-appropriate, Distress- none acute, trim Skin- rash-none, lesions- none, excoriation- none Lymphadenopathy- none Head- atraumatic            Eyes- Gross vision intact, PERRLA, conjunctivae and secretions clear            Ears- Hearing, canals-normal            Nose- +narrow nose, Clear, no-Septal dev, mucus, polyps, erosion, perforation             Throat- Mallampati III-IV, mucosa clear , drainage- none, tonsils- atrophic Neck- flexible , trachea midline, no stridor , thyroid nl, carotid no bruit Chest - symmetrical excursion , unlabored           Heart/CV- RRR , no murmur , no gallop  , no rub, nl s1 s2                           - JVD- none , edema- none, stasis changes- none, varices- none  Lung- clear to P&A, wheeze- none, cough- none , dullness-none, rub- none           Chest wall-  Pacemaker L Abd- tender-no, distended-no, bowel sounds-present, HSM- no Br/ Gen/ Rectal- Not done, not indicated Extrem- cyanosis- none, clubbing, none, atrophy- none, strength- nl Neuro- grossly intact to observation

## 2014-06-29 ENCOUNTER — Telehealth: Payer: Self-pay | Admitting: Internal Medicine

## 2014-06-29 NOTE — Telephone Encounter (Signed)
If she would like to come in at 11:15am on 07-13-14 we can see her then; otherwise patient can be worked in on 07-15-14 at 4:15pm slot. Thanks.

## 2014-06-29 NOTE — Telephone Encounter (Signed)
Pt was to come in on 8/11 for a 6wk follow up. We have to move appt due to change in schd. Nothing available until 9/3. Please advise

## 2014-06-30 NOTE — Telephone Encounter (Signed)
lmtcb x1 

## 2014-06-30 NOTE — Telephone Encounter (Signed)
I called pt to r/s-Pt needs an afternoon appt, so she took 8/13/ appointment at 4:15 PM.  Nothing further needed at this time.  [name]

## 2014-07-06 ENCOUNTER — Ambulatory Visit (HOSPITAL_BASED_OUTPATIENT_CLINIC_OR_DEPARTMENT_OTHER): Payer: Medicare Other | Attending: Internal Medicine | Admitting: Radiology

## 2014-07-06 VITALS — Ht 64.5 in | Wt 124.0 lb

## 2014-07-06 DIAGNOSIS — G4733 Obstructive sleep apnea (adult) (pediatric): Secondary | ICD-10-CM | POA: Diagnosis present

## 2014-07-06 DIAGNOSIS — R0609 Other forms of dyspnea: Secondary | ICD-10-CM | POA: Diagnosis not present

## 2014-07-06 DIAGNOSIS — R0989 Other specified symptoms and signs involving the circulatory and respiratory systems: Secondary | ICD-10-CM | POA: Insufficient documentation

## 2014-07-11 DIAGNOSIS — G4733 Obstructive sleep apnea (adult) (pediatric): Secondary | ICD-10-CM

## 2014-07-11 NOTE — Sleep Study (Signed)
NAME: [name] DATE OF BIRTH:  [birth year] MEDICAL RECORD NUMBER [id number]  LOCATION: [facility]  PHYSICIAN: Yeriel Mineo D  DATE OF STUDY: 07/06/2014  SLEEP STUDY TYPE: Nocturnal Polysomnogram               REFERRING PHYSICIAN: Jetty Duhamel D, MD  INDICATION FOR STUDY: Hypersomnia with sleep apnea  EPWORTH SLEEPINESS SCORE:   12/24 HEIGHT: 5' 4.5" (163.8 cm)  WEIGHT: 124 lb (56.246 kg)    Body mass index is 20.96 kg/(m^2).  NECK SIZE: 12 in.  MEDICATIONS: Charted for review  SLEEP ARCHITECTURE: Split study protocol. During the diagnostic phase, total sleep time 120 minutes with sleep efficiency 80.5%. Stage I was 2.9%, stage II 87.9%, stage III 0.4%, REM 8.8% of total sleep time. Sleep latency 31.5 minutes, REM latency 86 minutes, awake after sleep onset 0, arousal index 11.5, bedtime medication: Lorazepam.  RESPIRATORY DATA: Apnea hypopneas index (AHI) 22 per hour. 44 total events scored including 7 obstructive apneas, 2 central apneas, 4 mixed apneas, 31 hypopneas. All events were recorded while supine. REM AHI 62.9 per hour. CPAP titrated to 13 CWP, AHI 0 per hour. She wore small fullface mask.  OXYGEN DATA: Loud snoring before CPAP with oxygen desaturation to a nadir of 68% on room air. With CPAP control, snoring was prevented and mean oxygen saturation was 96.3% on room air.  CARDIAC DATA: Regular rhythm with pacemaker  MOVEMENT/PARASOMNIA: No significant movement disturbance, bathroom x1  IMPRESSION/ RECOMMENDATION:   1) Moderate obstructive sleep apnea/hypopneas syndrome, AHI 22 per hour with supine events. REM AHI 62.9 per hour. Loud snoring with oxygen desaturation to a nadir of 68% on room air. 2) Successful CPAP titration to 13 CWP, AHI 0 per hour. She wore a small ResMed AirFit F10 fullface mask with heated humidifier. Snoring was prevented and mean oxygen saturation of 96.3% on room air.   Waymon Budge Diplomate, American Board of  Sleep Medicine  ELECTRONICALLY SIGNED ON:  07/11/2014, 11:42 AM [facility] PH: [phone]   FX: [phone] ACCREDITED BY THE AMERICAN ACADEMY OF SLEEP MEDICINE

## 2014-07-13 ENCOUNTER — Ambulatory Visit: Payer: Medicare Other | Admitting: Internal Medicine

## 2014-07-15 ENCOUNTER — Other Ambulatory Visit: Payer: Self-pay | Admitting: Cardiology

## 2014-07-15 ENCOUNTER — Encounter: Payer: Self-pay | Admitting: Internal Medicine

## 2014-07-15 ENCOUNTER — Encounter (INDEPENDENT_AMBULATORY_CARE_PROVIDER_SITE_OTHER): Payer: Self-pay

## 2014-07-15 ENCOUNTER — Ambulatory Visit (INDEPENDENT_AMBULATORY_CARE_PROVIDER_SITE_OTHER): Payer: Medicare Other | Admitting: Internal Medicine

## 2014-07-15 VITALS — BP 124/82 | HR 67 | Ht 64.5 in | Wt 126.4 lb

## 2014-07-15 DIAGNOSIS — F419 Anxiety disorder, unspecified: Secondary | ICD-10-CM

## 2014-07-15 DIAGNOSIS — G4730 Sleep apnea, unspecified: Secondary | ICD-10-CM

## 2014-07-15 DIAGNOSIS — G4733 Obstructive sleep apnea (adult) (pediatric): Secondary | ICD-10-CM

## 2014-07-15 NOTE — Patient Instructions (Signed)
Order- new DME new CPAP 13 cwp, mask of choice, humidifier, supplies    Dx OSA

## 2014-07-15 NOTE — Assessment & Plan Note (Signed)
Moderate obstructive sleep apnea Plan-new CPAP starting at 13. Appropriate education and discussion done with questions answered

## 2014-07-15 NOTE — Progress Notes (Signed)
05/21/14- 67 yoF never smoker-COMPLAINS OF: Old pt of CDY-Hx of OSA,  Diagnosed obstructive sleep apnea 2004, AHI 18 per hour. She did not tolerate CPAP. She looked into an oral appliance it was too expensive. Husband now complains that she gasps and snores loudly in her sleep. Arthritis limits sleep positions. Sleep is unrefreshing and she is sleepy if she sits quietly. Bedtime 10:30 or 11:30 PM, short latency, waking 4 or5 times before up around 7:30 AM. Occasional lorazepam for sleep. ENT surgery for repair of lacrimal duct. History of deviated septum. Partial thyroidectomy. Not much seasonal rhinitis. Pacemaker for palpitations.  07/15/14- 66 yo F never smoker followed for OSA NPSG 07/06/14- Moderate OSA AHI 22/ hr, CPAP titrated to 13, weight 124 lbs Husband here We discussed her sleep study, reviewed the data, and discussed treatment options. She is quite petite but definitely has obstructive sleep apnea. She had not tolerated CPAP 11 years ago but is willing to try again. We discussed medical concerns, CPAP and oral appliances.  ROS-see HPI Constitutional:   No-   weight loss, night sweats, fevers, chills, +fatigue, lassitude. HEENT:   No-  headaches, difficulty swallowing, tooth/dental problems, sore throat,       No-  sneezing, itching, ear ache, nasal congestion, post nasal drip,  CV:  No-   chest pain, orthopnea, PND, swelling in lower extremities, anasarca,                                  dizziness, palpitations Resp: No-   shortness of breath with exertion or at rest.              No-   productive cough,  No non-productive cough,  No- coughing up of blood.              No-   change in color of mucus.  No- wheezing.   Skin: No-   rash or lesions. GI:  No-   heartburn, indigestion, abdominal pain, nausea, vomiting,  GU:  MS:  No-   joint pain or swelling.  . Neuro-     nothing unusual Psych:  No- change in mood or affect. No depression or anxiety.  No memory loss.  OBJ- Physical  Exam General- Alert, Oriented, Affect-appropriate, Distress- none acute, trim Skin- rash-none, lesions- none, excoriation- none Lymphadenopathy- none Head- atraumatic            Eyes- Gross vision intact, PERRLA, conjunctivae and secretions clear            Ears- Hearing, canals-normal            Nose- +narrow nose, Clear, no-Septal dev, mucus, polyps, erosion, perforation             Throat- Mallampati III-IV, mucosa clear , drainage- none, tonsils- atrophic Neck- flexible , trachea midline, no stridor , thyroid nl, carotid no bruit Chest - symmetrical excursion , unlabored           Heart/CV- RRR , no murmur , no gallop  , no rub, nl s1 s2                           - JVD- none , edema- none, stasis changes- none, varices- none           Lung- clear to P&A, wheeze- none, cough- none , dullness-none, rub- none  Chest wall-  Pacemaker L Abd-  Br/ Gen/ Rectal- Not done, not indicated Extrem- cyanosis- none, clubbing, none, atrophy- none, strength- nl Neuro- grossly intact to observation

## 2014-08-03 ENCOUNTER — Telehealth: Payer: Self-pay | Admitting: Cardiology

## 2014-08-03 ENCOUNTER — Ambulatory Visit (INDEPENDENT_AMBULATORY_CARE_PROVIDER_SITE_OTHER): Payer: Medicare Other | Admitting: *Deleted

## 2014-08-03 DIAGNOSIS — I495 Sick sinus syndrome: Secondary | ICD-10-CM

## 2014-08-03 LAB — MDC_IDC_ENUM_SESS_TYPE_REMOTE
Battery Impedance: 3539 Ohm
Battery Voltage: 2.68 V
Brady Statistic AP VP Percent: 0 %
Brady Statistic AP VS Percent: 60 %
Brady Statistic AS VP Percent: 0 %
Date Time Interrogation Session: 20150901154920
Lead Channel Impedance Value: 582 Ohm
Lead Channel Pacing Threshold Amplitude: 0.75 V
Lead Channel Pacing Threshold Amplitude: 0.75 V
Lead Channel Pacing Threshold Pulse Width: 0.4 ms
Lead Channel Sensing Intrinsic Amplitude: 2.8 mV
Lead Channel Setting Pacing Amplitude: 2.5 V
MDC IDC MSMT BATTERY REMAINING LONGEVITY: 13 mo
MDC IDC MSMT LEADCHNL RV IMPEDANCE VALUE: 591 Ohm
MDC IDC MSMT LEADCHNL RV PACING THRESHOLD PULSEWIDTH: 0.4 ms
MDC IDC MSMT LEADCHNL RV SENSING INTR AMPL: 16 mV
MDC IDC SET LEADCHNL RA PACING AMPLITUDE: 2 V
MDC IDC SET LEADCHNL RV PACING PULSEWIDTH: 0.4 ms
MDC IDC SET LEADCHNL RV SENSING SENSITIVITY: 5.6 mV
MDC IDC STAT BRADY AS VS PERCENT: 40 %

## 2014-08-03 NOTE — Progress Notes (Signed)
Remote pacemaker transmission.   

## 2014-08-03 NOTE — Telephone Encounter (Signed)
Spoke with pt and reminded pt of remote transmission that is due today. Pt verbalized understanding.   

## 2014-08-04 ENCOUNTER — Telehealth: Payer: Self-pay | Admitting: Internal Medicine

## 2014-08-04 DIAGNOSIS — G4733 Obstructive sleep apnea (adult) (pediatric): Secondary | ICD-10-CM

## 2014-08-04 NOTE — Telephone Encounter (Signed)
Suggest order Advanced reduce CPAP to 11 for comfort    Dx OSA

## 2014-08-04 NOTE — Telephone Encounter (Signed)
Called spoke with pt. Aware order placed to decrease CPAP pressure.

## 2014-08-04 NOTE — Telephone Encounter (Signed)
Called spoke with pt. She reports pt was set up with [facility]. She tried using the mask Andy set up up x 3-4 nights. The mask was not working for her. Pt reports she had right eye tear duct surgery 6-7 years ago. She reports the CPAP machine was blowing up in that and into right eye. Pt 1 week ago got new mask. Pt reports she still can feel the air blowing into eye.  She thinks her machine is set at 13 cm. She was told maybe we could cut back the pressure to see if this helps. She was told by Jonni Sanger per pt bc of the tear duct she may not be able to use CPAP.  Please advise CDY thanks

## 2014-08-11 ENCOUNTER — Encounter: Payer: Self-pay | Admitting: Cardiology

## 2014-08-20 ENCOUNTER — Telehealth: Payer: Self-pay | Admitting: Family Medicine

## 2014-08-20 NOTE — Telephone Encounter (Signed)
Pt wanted to know if she had her pneumonia shot.  If so when did she get it?

## 2014-08-23 NOTE — Telephone Encounter (Signed)
No record of pneumonia vaccine.

## 2014-08-23 NOTE — Telephone Encounter (Signed)
Patient notified as instructed by telephone. Was advised by patient that she has an appointment scheduled for a flu shot Wednesday and will wait on the pneumonia vaccine. Patient stated that she will schedule a CPE when she comes in Wednesday for the flu shot.

## 2014-08-23 NOTE — Telephone Encounter (Signed)
Can get at RN visit.  Now that she is age >46, we would have talked about it at her next CPE. Thanks.

## 2014-08-25 ENCOUNTER — Ambulatory Visit (INDEPENDENT_AMBULATORY_CARE_PROVIDER_SITE_OTHER): Payer: Medicare Other

## 2014-08-25 DIAGNOSIS — Z23 Encounter for immunization: Secondary | ICD-10-CM

## 2014-08-26 ENCOUNTER — Telehealth: Payer: Self-pay | Admitting: Internal Medicine

## 2014-08-26 DIAGNOSIS — G4733 Obstructive sleep apnea (adult) (pediatric): Secondary | ICD-10-CM

## 2014-08-26 NOTE — Telephone Encounter (Signed)
Pt returned call [phone] or [phone]

## 2014-08-27 NOTE — Telephone Encounter (Signed)
Spoke with patient-she states she is having trouble with her mask; she has been able to use half face mask more than anything. Pt states the mask is leaking from too much pressure. CY would like to have patient go to the daytime sleep center staff to help her with mask fitting as her pressure is currently on 11 and her mask should be able to tolerated that pressure. Will keep her pressure at 11 at this time.   Pt is aware of this and agrees to go to the daytime sleep center for help with mask fittings. Order has been placed-pt requests an appt around 2-2:30pm in the afternoon as she attends lunch with her elderly mother everyday.

## 2014-08-27 NOTE — Telephone Encounter (Signed)
Patient calling  Back.  [phone]

## 2014-08-30 ENCOUNTER — Other Ambulatory Visit: Payer: Self-pay | Admitting: Dermatology

## 2014-08-31 ENCOUNTER — Ambulatory Visit (HOSPITAL_BASED_OUTPATIENT_CLINIC_OR_DEPARTMENT_OTHER): Payer: Medicare Other | Attending: Internal Medicine | Admitting: Radiology

## 2014-08-31 DIAGNOSIS — Z9989 Dependence on other enabling machines and devices: Principal | ICD-10-CM

## 2014-08-31 DIAGNOSIS — G4733 Obstructive sleep apnea (adult) (pediatric): Secondary | ICD-10-CM

## 2014-09-02 ENCOUNTER — Encounter: Payer: Self-pay | Admitting: Internal Medicine

## 2014-09-03 ENCOUNTER — Other Ambulatory Visit: Payer: Medicare Other

## 2014-09-06 ENCOUNTER — Encounter: Payer: Medicare Other | Admitting: Family Medicine

## 2014-09-16 ENCOUNTER — Ambulatory Visit: Payer: Medicare Other | Admitting: Internal Medicine

## 2014-09-17 ENCOUNTER — Telehealth: Payer: Self-pay | Admitting: Internal Medicine

## 2014-09-17 DIAGNOSIS — G4733 Obstructive sleep apnea (adult) (pediatric): Secondary | ICD-10-CM

## 2014-09-17 NOTE — Telephone Encounter (Signed)
Called spoke with pt. Aware CDY has no sooner appts.  She is scheduled for 10/05/14 appt. Pt reports she is having problems adjusting with CPAP mask and leaking. She has been over to the sleep center and have worked with [facility]. Pt has not used CPAP in a couple of nights d/t this. Also reports the CPAP is causing ulcers in her mouth.  Please advise Dr. Annamaria Boots thanks

## 2014-09-20 ENCOUNTER — Other Ambulatory Visit (INDEPENDENT_AMBULATORY_CARE_PROVIDER_SITE_OTHER): Payer: Medicare Other | Admitting: *Deleted

## 2014-09-20 DIAGNOSIS — E785 Hyperlipidemia, unspecified: Secondary | ICD-10-CM

## 2014-09-20 DIAGNOSIS — I495 Sick sinus syndrome: Secondary | ICD-10-CM

## 2014-09-20 DIAGNOSIS — I119 Hypertensive heart disease without heart failure: Secondary | ICD-10-CM

## 2014-09-20 LAB — BASIC METABOLIC PANEL
BUN: 15 mg/dL (ref 6–23)
CALCIUM: 8.8 mg/dL (ref 8.4–10.5)
CHLORIDE: 100 meq/L (ref 96–112)
CO2: 27 mEq/L (ref 19–32)
Creatinine, Ser: 0.8 mg/dL (ref 0.4–1.2)
GFR: 72.92 mL/min (ref >60.00)
Glucose, Bld: 101 mg/dL — ABNORMAL HIGH (ref 70–99)
Potassium: 3.9 mEq/L (ref 3.5–5.1)
Sodium: 138 mEq/L (ref 135–145)

## 2014-09-20 LAB — HEPATIC FUNCTION PANEL
ALK PHOS: 51 U/L (ref 39–117)
ALT: 20 U/L (ref 0–35)
AST: 21 U/L (ref 0–37)
Albumin: 3.8 g/dL (ref 3.5–5.2)
Bilirubin, Direct: 0 mg/dL (ref 0.0–0.3)
TOTAL PROTEIN: 7.2 g/dL (ref 6.0–8.3)
Total Bilirubin: 0.6 mg/dL (ref 0.2–1.2)

## 2014-09-20 LAB — LIPID PANEL
CHOL/HDL RATIO: 4
Cholesterol: 197 mg/dL (ref 0–200)
HDL: 47.6 mg/dL (ref >39.00)
LDL CALC: 127 mg/dL — AB (ref 0–99)
NonHDL: 149.4
Triglycerides: 111 mg/dL (ref 0.0–149.0)
VLDL: 22.2 mg/dL (ref 0.0–40.0)

## 2014-09-20 NOTE — Progress Notes (Signed)
Quick Note:  Please make copy of labs for patient visit. ______ 

## 2014-09-21 NOTE — Telephone Encounter (Signed)
Message has not been addressed. Please advise Dr. Annamaria Boots thanks

## 2014-09-21 NOTE — Telephone Encounter (Signed)
Pt calling back a/b trouble she has been having w/ c-pap cn be reach @ [facility] until 12 afterwards will have to call cell# [phone].Hillery Hunter

## 2014-09-21 NOTE — Telephone Encounter (Signed)
Per CY-lets send DME [facility] an order to help patient get a better mask fitting as she will not be compliant without a correct fitting mask. She will need to try different masks from [facility]. Pt needs to make sure to keep her OV on 10-05-14 with CY as well.   This is the only suggestion we have at this time as patient was unable to go forward with oral appliance in the past.

## 2014-09-21 NOTE — Telephone Encounter (Signed)
Called and spoke to pt. Informed pt of recs per CY. Pt verbalized understanding and denied any further questions or concerns at this time. Order placed. Nothing further needed.

## 2014-09-24 ENCOUNTER — Ambulatory Visit (INDEPENDENT_AMBULATORY_CARE_PROVIDER_SITE_OTHER): Payer: Medicare Other | Admitting: Cardiology

## 2014-09-24 ENCOUNTER — Encounter: Payer: Self-pay | Admitting: Cardiology

## 2014-09-24 VITALS — BP 122/84 | HR 76 | Ht 64.0 in | Wt 124.0 lb

## 2014-09-24 DIAGNOSIS — E785 Hyperlipidemia, unspecified: Secondary | ICD-10-CM

## 2014-09-24 DIAGNOSIS — I495 Sick sinus syndrome: Secondary | ICD-10-CM

## 2014-09-24 DIAGNOSIS — E7800 Pure hypercholesterolemia, unspecified: Secondary | ICD-10-CM

## 2014-09-24 DIAGNOSIS — K219 Gastro-esophageal reflux disease without esophagitis: Secondary | ICD-10-CM

## 2014-09-24 DIAGNOSIS — L659 Nonscarring hair loss, unspecified: Secondary | ICD-10-CM

## 2014-09-24 DIAGNOSIS — I119 Hypertensive heart disease without heart failure: Secondary | ICD-10-CM

## 2014-09-24 NOTE — Assessment & Plan Note (Signed)
Symptoms are controlled with generic Axid

## 2014-09-24 NOTE — Patient Instructions (Signed)
Your physician recommends that you continue on your current medications as directed. Please refer to the Current Medication list given to you today.  Your physician wants you to follow-up in: 4 months with fasting labs (lp/bmet/hfp) You will receive a reminder letter in the mail two months in advance. If you don't receive a letter, please call our office to schedule the follow-up appointment.  

## 2014-09-24 NOTE — Progress Notes (Addendum)
[name] Date of Birth:  1946/11/11 [facility] [address] [place], [place]  [address] [phone]        Fax   [phone]   History of Present Illness: This pleasant 68 year old woman is seen for a scheduled followup office visit. He has a past history of essential hypertension and a history of symptomatic palpitations. She also has a functioning dual-chamber pacemaker implanted by Dr. Verlon Setting in about 2006. The patient has a history of atypical chest pain. She does not have any history of known ischemic heart disease. She does have a history of gastroesophageal reflux disease. She underwent upper endoscopy and colonoscopy in June 2014 by her gastroenterologist Dr. Fuller Plan. A few colonic polyps were found and she will need another colonoscopy in 5 years. The patient has been experiencing occasional palpitations. The patient has been experiencing some alopecia. She has seen her dermatologist. He checked her iron level and recommended trying a multivitamin with iron.  He also has her on Biotin. Since last visit she has had no new cardiac symptoms.  She does complain of lack of energy. The patient has a history of sleep apnea.  She has an appointment to see Dr. Annamaria Boots later this week.  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  Also she has had a difficult time getting the proper seal from the machine. She is still under a lot of stress.  Her mother has Lewy body dementia and is at [facility].   Current Outpatient Prescriptions  Medication Sig Dispense Refill  . Acetaminophen (TYLENOL PO) Take 1 tablet by mouth as needed.       Marland Kitchen BIOTIN PO Take by mouth daily.      Marland Kitchen diltiazem (CARDIZEM CD) 120 MG 24 hr capsule Take 1 capsule (120 mg total) by mouth daily.  30 capsule  11  . ibuprofen (ADVIL,MOTRIN) 200 MG tablet Take 200 mg by mouth as needed (arthritis).      . LORazepam (ATIVAN) 1 MG tablet TAKE 1  TABLET BY MOUTH 4 TIMES A DAY AS NEEDED  120 tablet  3  . nitroGLYCERIN (NITROSTAT) 0.4 MG SL tablet Place 1 tablet (0.4 mg total) under the tongue every 5 (five) minutes as needed. For up to 3 doses  25 tablet  4  . nizatidine (AXID) 150 MG capsule Take 1 capsule (150 mg total) by mouth 2 (two) times daily.  180 capsule  3  . terconazole (TERAZOL 3) 0.8 % vaginal cream Place 1 applicator vaginally at bedtime. For 3 nights  20 g  3   No current facility-administered medications for this visit.    Allergies  Allergen Reactions  . Antihistamines, Loratadine-Type   . Bisoprolol-Hydrochlorothiazide     Per pt: unknown  . Bystolic [Nebivolol Hcl]     Per pt: unknown  . Cephalexin Nausea And Vomiting  . Codeine     Increased heart rate, anxiety  . Demerol [Meperidine]     Increased heart rate, anxiety  . Epinephrine     Increased heart rate  . Erythromycin     REACTION: nausea  . Hyoscyamine Sulfate     Per pt: unknown  . Inderal [Propranolol]     Extreme tiredness  . Lidocaine     Family hx of malignant hyperthermia  . Lipitor [Atorvastatin Calcium]     aches  . Lipitor [Atorvastatin]  Muscle aches  . Losartan Potassium     Per pt: unknown  . Morphine     Nightmares, increased BP  . Paxil [Paroxetine Hcl]     Per pt: unknown  . Penicillins Nausea And Vomiting  . Prednisone     Restless  . Propranolol Hcl     Per pt: unknown  . Sertraline Hcl     Per pt: unknown  . Tetracycline Nausea And Vomiting  . Toprol Xl [Metoprolol Succinate]     Extreme tiredness  . Verapamil     Per pt: unknown    Patient Active Problem List   Diagnosis Date Noted  . HYPERLIPIDEMIA 03/30/2008    Priority: High  . Osteoarthritis of knee 04/19/2011    Priority: Medium  . CHEST PAIN 06/28/2009    Priority: Medium  . Chest pain 05/19/2014  . Rapid palpitations 09/23/2013  . Gastroenteritis 08/07/2013  . Fatigue 08/07/2013  . Alopecia 05/11/2013  . Benign hypertensive heart disease  without heart failure 01/07/2013  . URI (upper respiratory infection) 10/27/2012  . Pacemaker-Medtronic 10/08/2012  . Hiatal hernia 05/28/2012  . Esophageal stricture 05/28/2012  . Dysuria 04/03/2012  . Osteoporosis screening 01/16/2012  . Breast mass 01/16/2012  . Anxiety 01/16/2012  . Sick sinus syndrome   . Internal hemorrhoids 02/28/2011  . Rectal or anal pain 02/28/2011  . History of colon polyps 02/28/2011  . Constipation 02/28/2011  . GERD 06/28/2009  . COLONIC POLYPS, ADENOMATOUS, HX OF 06/24/2009  . HEMORRHOIDS 03/30/2008  . Obstructive sleep apnea 03/30/2008  . History of SCC (squamous cell carcinoma) of skin 03/30/2008    History  Smoking status  . Never Smoker   Smokeless tobacco  . Never Used    History  Alcohol Use No    Family History  Problem Relation Age of Onset  . Colon cancer Neg Hx   . Heart failure Sister   . Heart disease Sister   . Hypertension Sister   . Dementia Mother   . Hypertension Mother   . Hypertension Father   . Arthritis Father   . Breast cancer Maternal Aunt     Age 49's  . Diabetes Maternal Aunt   . Diabetes Maternal Uncle   . Ovarian cancer Maternal Aunt     Review of Systems: Constitutional: no fever chills diaphoresis or fatigue or change in weight.  Head and neck: no hearing loss, no epistaxis, no photophobia or visual disturbance. Respiratory: No cough, shortness of breath or wheezing. Cardiovascular: No chest pain peripheral edema, palpitations. Gastrointestinal: No abdominal distention, no abdominal pain, no change in bowel habits hematochezia or melena. Genitourinary: No dysuria, no frequency, no urgency, no nocturia. Musculoskeletal:No arthralgias, no back pain, no gait disturbance or myalgias. Neurological: No dizziness, no headaches, no numbness, no seizures, no syncope, no weakness, no tremors. Hematologic: No lymphadenopathy, no easy bruising. Psychiatric: No confusion, no hallucinations, no sleep  disturbance.    Physical Exam: Filed Vitals:   09/24/14 1529  BP: 122/84  Pulse: 76   the general appearance reveals a well-developed well-nourished woman in no distress.The head and neck exam reveals pupils equal and reactive.  Extraocular movements are full.  There is no scleral icterus.  The mouth and pharynx are normal.  The neck is supple.  The carotids reveal no bruits.  The jugular venous pressure is normal.  The  thyroid is not enlarged.  There is no lymphadenopathy.  The chest is clear to percussion and auscultation.  There are no rales or  rhonchi.  Expansion of the chest is symmetrical.  The precordium is quiet.  The first heart sound is normal.  The second heart sound is physiologically split.  There is no murmur gallop rub or click.  There is no abnormal lift or heave.  The abdomen is soft and nontender.  The bowel sounds are normal.  The liver and spleen are not enlarged.  There are no abdominal masses.  There are no abdominal bruits.  Extremities reveal good pedal pulses.  There is no phlebitis or edema.  There is no cyanosis or clubbing.  Strength is normal and symmetrical in all extremities.  There is no lateralizing weakness.  There are no sensory deficits.  The skin is warm and dry.  There is no rash.  EKG shows atrial paced rhythm.  There is left anterior fascicular block.  No ischemic changes.  Assessment / Plan: 1. benign hypertensive heart disease without heart failure 2. Hyperlipidemia 3. sleep apnea 4. atypical chest discomfort 5. sick sinus syndrome with functioning dual-chamber pacemaker. 6. GERD 7. alopecia unknown cause  Plan: Continue same medication.   Recheck in 4 months for office visit lipid panel hepatic function panel and basal metabolic panel

## 2014-09-24 NOTE — Assessment & Plan Note (Signed)
Her recent lipids were reviewed.  Labs are satisfactory.  Continue current medication and careful diet.  She is intolerant of statins.

## 2014-09-24 NOTE — Assessment & Plan Note (Signed)
Her blood pressure was stable on current therapy

## 2014-09-24 NOTE — Assessment & Plan Note (Signed)
She is not aware of any racing of her heart a tachycardia

## 2014-10-04 ENCOUNTER — Encounter: Payer: Self-pay | Admitting: Cardiology

## 2014-10-04 ENCOUNTER — Telehealth: Payer: Self-pay | Admitting: Family Medicine

## 2014-10-04 NOTE — Telephone Encounter (Signed)
Patient had lab work done by [employer] on 09/20/14.  Patient is coming in for lab work for her physical on 10/06/14.  Patient wanted to make sure that the lab work she had done by Dr.Brackbill won't be repeated for her physical labs. Please let patient know if she doesn't need to come in for lab work.

## 2014-10-04 NOTE — Telephone Encounter (Signed)
I think Terri already handled this. She doesn't need extra labs. Thanks.

## 2014-10-05 ENCOUNTER — Encounter: Payer: Self-pay | Admitting: Internal Medicine

## 2014-10-05 ENCOUNTER — Ambulatory Visit (INDEPENDENT_AMBULATORY_CARE_PROVIDER_SITE_OTHER): Payer: Medicare Other | Admitting: Internal Medicine

## 2014-10-05 VITALS — BP 122/78 | HR 61 | Ht 64.5 in | Wt 127.6 lb

## 2014-10-05 DIAGNOSIS — G4733 Obstructive sleep apnea (adult) (pediatric): Secondary | ICD-10-CM

## 2014-10-05 NOTE — Progress Notes (Signed)
05/21/14- 67 yoF never smoker-COMPLAINS OF: Old pt of CDY-Hx of OSA,  Diagnosed obstructive sleep apnea 2004, AHI 18 per hour. She did not tolerate CPAP. She looked into an oral appliance it was too expensive. Husband now complains that she gasps and snores loudly in her sleep. Arthritis limits sleep positions. Sleep is unrefreshing and she is sleepy if she sits quietly. Bedtime 10:30 or 11:30 PM, short latency, waking 4 or5 times before up around 7:30 AM. Occasional lorazepam for sleep. ENT surgery for repair of lacrimal duct. History of deviated septum. Partial thyroidectomy. Not much seasonal rhinitis. Pacemaker for palpitations.  07/15/14- 64 yo F never smoker followed for OSA NPSG 07/06/14- Moderate OSA AHI 22/ hr, CPAP titrated to 13, weight 124 lbs Husband here We discussed her sleep study, reviewed the data, and discussed treatment options. She is quite petite but definitely has obstructive sleep apnea. She had not tolerated CPAP 11 years ago but is willing to try again. We discussed medical concerns, CPAP and oral appliances.  10/05/14- 11 yo F never smoker followed for OSA FOLLOWS FOR: Pt states she has not worn CPAP in 2 weeks. Pt states she issues with the several masks she has had in the past. Pt c/o leaking in the mask.  CPAP 13/ Advanced Download indicates not enough use, pressure set at 11 and not good enough control In the past she had felt oral appliances were too expensive.  ROS-see HPI Constitutional:   No-   weight loss, night sweats, fevers, chills, +fatigue, lassitude. HEENT:   No-  headaches, difficulty swallowing, tooth/dental problems, sore throat,       No-  sneezing, itching, ear ache, nasal congestion, post nasal drip,  CV:  No-   chest pain, orthopnea, PND, swelling in lower extremities, anasarca,  dizziness, palpitations Resp: No-   shortness of breath with exertion or at rest.              No-   productive cough,  No non-productive cough,  No- coughing up of blood.               No-   change in color of mucus.  No- wheezing.   Skin: No-   rash or lesions. GI:  No-   heartburn, indigestion, abdominal pain, nausea, vomiting,  GU:  MS:  No-   joint pain or swelling.  . Neuro-     nothing unusual Psych:  No- change in mood or affect. No depression or anxiety.  No memory loss.  OBJ- Physical Exam General- Alert, Oriented, Affect-appropriate, Distress- none acute, trim Skin- rash-none, lesions- none, excoriation- none Lymphadenopathy- none Head- atraumatic            Eyes- Gross vision intact, PERRLA, conjunctivae and secretions clear            Ears- Hearing, canals-normal            Nose- +narrow nose, Clear, no-Septal dev, mucus, polyps, erosion, perforation             Throat- Mallampati III-IV, mucosa clear , drainage- none, tonsils- atrophic Neck- flexible , trachea midline, no stridor , thyroid nl, carotid no bruit Chest - symmetrical excursion , unlabored           Heart/CV- RRR , no murmur , no gallop  , no rub, nl s1 s2                           -  JVD- none , edema- none, stasis changes- none, varices- none           Lung- clear to P&A, wheeze- none, cough- none , dullness-none, rub- none           Chest wall-  Pacemaker L Abd-  Br/ Gen/ Rectal- Not done, not indicated Extrem- cyanosis- none, clubbing, none, atrophy- none, strength- nl Neuro- grossly intact to observation

## 2014-10-05 NOTE — Patient Instructions (Signed)
Order- DME [facility]- change CPAP to autoPAP           5-11 cwp    Dx OSA  Ok to check on line- perhaps [email or web address]- for a soft gasket that can fit your mask to improve seal  Consider checking with Oneal Grout, DDS, Orthodontist, about oral appliances for sleep apnea  Ok to try the otc "boil and bite" type heat softened mouth pieces

## 2014-10-06 ENCOUNTER — Other Ambulatory Visit: Payer: Medicare Other

## 2014-10-11 ENCOUNTER — Encounter: Payer: Self-pay | Admitting: Family Medicine

## 2014-10-11 ENCOUNTER — Ambulatory Visit (INDEPENDENT_AMBULATORY_CARE_PROVIDER_SITE_OTHER): Payer: Medicare Other | Admitting: Family Medicine

## 2014-10-11 VITALS — BP 128/82 | HR 84 | Temp 98.6°F | Ht 64.0 in | Wt 122.5 lb

## 2014-10-11 DIAGNOSIS — Z7189 Other specified counseling: Secondary | ICD-10-CM

## 2014-10-11 DIAGNOSIS — Z0000 Encounter for general adult medical examination without abnormal findings: Secondary | ICD-10-CM

## 2014-10-11 DIAGNOSIS — Z23 Encounter for immunization: Secondary | ICD-10-CM

## 2014-10-11 NOTE — Patient Instructions (Signed)
Take care.  We can get you the other pneumonia shot next year.  I would get a flu shot each fall.   Glad to see you.

## 2014-10-11 NOTE — Progress Notes (Signed)
Pre visit review using our clinic review tool, if applicable. No additional management support is needed unless otherwise documented below in the visit note.  I have personally reviewed the Medicare Annual Wellness questionnaire and have noted 1. The patient's medical and social history 2. Their use of alcohol, tobacco or illicit drugs 3. Their current medications and supplements 4. The patient's functional ability including ADL's, fall risks, home safety risks and hearing or visual             impairment. 5. Diet and physical activities 6. Evidence for depression or mood disorders  The patients weight, height, BMI have been recorded in the chart and visual acuity is per eye clinic.  I have made referrals, counseling and provided education to the patient based review of the above and I have provided the pt with a written personalized care plan for preventive services.  Provider list updated- see scanned forms.  Routine anticipatory guidance given to patient.  See health maintenance.  Flu 2015 Shingles 2013 PNA 2015 Tetanus 2008 Colonoscopy 2013 Breast cancer screening per gyn Pap not indicated.  DXA per gyn.  Advance directive- d/w pt.  Husband designated if patient were incapacitated.   Cognitive function addressed- see scanned forms- and if abnormal then additional documentation follows.   PMH and SH reviewed  Meds, vitals, and allergies reviewed.   ROS: See HPI.  Otherwise negative.    GEN: nad, alert and oriented HEENT: mucous membranes moist NECK: supple w/o LA CV: rrr. PULM: ctab, no inc wob ABD: soft, +bs EXT: no edema SKIN: no acute rash

## 2014-10-12 DIAGNOSIS — Z0000 Encounter for general adult medical examination without abnormal findings: Secondary | ICD-10-CM | POA: Insufficient documentation

## 2014-10-12 DIAGNOSIS — Z7189 Other specified counseling: Secondary | ICD-10-CM | POA: Insufficient documentation

## 2014-10-12 NOTE — Assessment & Plan Note (Signed)
Flu 2015 Shingles 2013 PNA 2015 Tetanus 2008 Colonoscopy 2013 Breast cancer screening per gyn Pap not indicated.  DXA per gyn.  Advance directive- d/w pt.  Husband designated if patient were incapacitated.   Cognitive function addressed- see scanned forms- and if abnormal then additional documentation follows.

## 2014-10-17 NOTE — Assessment & Plan Note (Signed)
She has been using CPAP at 11 but with inadequate compliance and control. She complains of mask leaks. I think she just doesn't tolerate having anything on her face. We talked again about oral appliances as an option. 2 reduce average pressure we agreed to try changing CPAP mode to AutoPap 5-11

## 2014-10-18 ENCOUNTER — Encounter: Payer: Self-pay | Admitting: Internal Medicine

## 2014-10-18 ENCOUNTER — Ambulatory Visit (INDEPENDENT_AMBULATORY_CARE_PROVIDER_SITE_OTHER): Payer: Medicare Other | Admitting: Internal Medicine

## 2014-10-18 VITALS — BP 130/70 | HR 84

## 2014-10-18 DIAGNOSIS — Z950 Presence of cardiac pacemaker: Secondary | ICD-10-CM

## 2014-10-18 DIAGNOSIS — I495 Sick sinus syndrome: Secondary | ICD-10-CM

## 2014-10-18 DIAGNOSIS — I119 Hypertensive heart disease without heart failure: Secondary | ICD-10-CM

## 2014-10-18 LAB — MDC_IDC_ENUM_SESS_TYPE_INCLINIC
Brady Statistic AP VP Percent: 0 %
Brady Statistic AS VS Percent: 40 %
Date Time Interrogation Session: 20151116111242
Lead Channel Impedance Value: 0 Ohm
Lead Channel Pacing Threshold Amplitude: 0.5 V
Lead Channel Pacing Threshold Amplitude: 0.5 V
Lead Channel Pacing Threshold Pulse Width: 0.4 ms
Lead Channel Pacing Threshold Pulse Width: 0.4 ms
Lead Channel Setting Pacing Amplitude: 2.5 V
Lead Channel Setting Pacing Pulse Width: 0.4 ms
Lead Channel Setting Sensing Sensitivity: 5.6 mV
MDC IDC MSMT BATTERY REMAINING LONGEVITY: 9 mo
MDC IDC MSMT LEADCHNL RA IMPEDANCE VALUE: 0 Ohm
MDC IDC MSMT LEADCHNL RA SENSING INTR AMPL: 2.8 mV
MDC IDC SET LEADCHNL RA PACING AMPLITUDE: 2 V
MDC IDC STAT BRADY AP VS PERCENT: 60 %
MDC IDC STAT BRADY AS VP PERCENT: 0 %

## 2014-10-18 NOTE — Patient Instructions (Signed)
Remote monitoring is used to monitor your Pacemaker or ICD from home. This monitoring reduces the number of office visits required to check your device to one time per year. It allows [place] to keep an eye on the functioning of your device to ensure it is working properly. You are scheduled for a device check from home on 11/17/14. You may send your transmission at any time that day. If you have a wireless device, the transmission will be sent automatically. After your physician reviews your transmission, you will receive a postcard with your next transmission date.  Your physician wants you to follow-up in: 9 months with Dr. Vallery Ridge will receive a reminder letter in the mail two months in advance. If you don't receive a letter, please call our office to schedule the follow-up appointment.

## 2014-10-18 NOTE — Progress Notes (Signed)
PCP: [name] Stain, MD Primary Cardiologist:  Dr [name] is a 68 y.o. female who presents today for routine electrophysiology followup.  Since last being seen in our clinic, the patient reports doing very well.  Today, she denies symptoms of chest pain, shortness of breath,  lower extremity edema, dizziness, presyncope, or syncope.  No arrhythmias on device interrogation today.  The patient is otherwise without complaint today.   Past Medical History  Diagnosis Date  . Colon polyp     adenomatous  . Hemorrhoids   . Inguinal hernia   . Hyperlipidemia   . Sleep apnea     can't use c-pap  . Allergic rhinitis   . GERD (gastroesophageal reflux disease)   . Bradycardia     s/p MDT PPM  . Atypical chest pain   . Hypertension   . Pacemaker   . Skin cancer     Basal and squamous cell  . Anemia   . Arthritis   . Blood in stool   . History of chicken pox   . Depression   . Cardiac arrhythmia due to congenital heart disease   . Heart murmur   . Thyroid disease   . UTI (urinary tract infection)   . Osteopenia 12/2012    T score -1.7 FRAX 8.5%/1.1%  . Eye problem     Vitrious detachment   Past Surgical History  Procedure Laterality Date  . Thyroidectomy  1979    RIGHT SIDE  . Polypectomy    . Pacemaker insertion  2006    Dr. Verlon Setting  . Abdominal hysterectomy    . Knee arthroscopy  2011  . Cesarean section    . Dilation and curettage of uterus    . Thumb surgery    . Oophorectomy  1995    LSO and RSO  . Tear duct surgery  2010  . Hand surgery  1992  . Inguinal hernia repair  02/12/1994    left inguinal hernia repair  . Hernia repair      Current Outpatient Prescriptions  Medication Sig Dispense Refill  . Acetaminophen (TYLENOL PO) Take 1,000 mg by mouth daily as needed (pain).     Marland Kitchen BIOTIN PO Take 1 capsule by mouth daily.     Marland Kitchen diltiazem (CARDIZEM CD) 120 MG 24 hr capsule Take 1 capsule (120 mg total) by mouth daily. 30 capsule 11  . ibuprofen  (ADVIL,MOTRIN) 200 MG tablet Take 200 mg by mouth every 6 (six) hours as needed (arthritis).     . LORazepam (ATIVAN) 1 MG tablet TAKE 1 TABLET BY MOUTH 4 TIMES A DAY AS NEEDED (Patient taking differently: TAKE 1 TABLET BY MOUTH 4 TIMES A DAY AS NEEDED FOR ANXIETY) 120 tablet 3  . nitroGLYCERIN (NITROSTAT) 0.4 MG SL tablet Place 1 tablet (0.4 mg total) under the tongue every 5 (five) minutes as needed. For up to 3 doses 25 tablet 4  . nizatidine (AXID) 150 MG capsule Take 1 capsule (150 mg total) by mouth 2 (two) times daily. 180 capsule 3  . terconazole (TERAZOL 3) 0.8 % vaginal cream Place 1 applicator vaginally at bedtime. For 3 nights 20 g 3   No current facility-administered medications for this visit.    Physical Exam: Filed Vitals:   10/18/14 1018  BP: 130/70  Pulse: 84    GEN- The patient is well appearing, alert and oriented x 3 today.   Head- normocephalic, atraumatic Eyes-  Sclera clear, conjunctiva pink Ears- hearing intact  Oropharynx- clear Lungs- Clear to ausculation bilaterally, normal work of breathing Chest- pacemaker pocket is well healed Heart- Regular rate and rhythm, no murmurs, rubs or gallops, PMI not laterally displaced GI- soft, NT, ND, + BS Extremities- no clubbing, cyanosis, or edema  Pacemaker interrogation- reviewed in detail today,  See PACEART report  Assessment and Plan:  1. Symptomatic sinus bradycardia Normal pacemaker function, though she had had a device reset since I saw her last.  As she has only 9 months left on the device and is not dependant, I will follow conservatively at this time. See Pace Art report No changes today  2. HTN Stable No change required today  Carelink monthly as she is approaching ERI. Return in 9 months

## 2014-10-22 ENCOUNTER — Telehealth: Payer: Self-pay | Admitting: Family Medicine

## 2014-10-22 NOTE — Telephone Encounter (Signed)
Form placed in Duncan's inbox for completion

## 2014-10-22 NOTE — Telephone Encounter (Signed)
Pt dropped off form to be filled out for Pre-Operative Clearance. Pt is scheduled to have surgery in January. Pt requests that you please fax the form to the dr's office when completed and notify her that it was done. Form placed on Lugene's desk to be given to Dr Damita Dunnings. Thank you!

## 2014-10-24 NOTE — Telephone Encounter (Signed)
Medically cleared but needs cardiac input since she has a pacer.  Please send a note to ortho; I'll CC this to cards for input.  Thanks.

## 2014-10-24 NOTE — Telephone Encounter (Signed)
OK to proceed from a CV standpoint.  I will have device clinic forward device related instructions when requested by anesthesia at time of the procedure.

## 2014-10-25 ENCOUNTER — Encounter: Payer: Self-pay | Admitting: *Deleted

## 2014-10-25 NOTE — Telephone Encounter (Signed)
Note and form faxed to Dr. Ronnie Derby  [phone]

## 2014-10-25 NOTE — Telephone Encounter (Signed)
See below, send this note to ortho along with the form. Thanks.

## 2014-10-26 ENCOUNTER — Telehealth: Payer: Self-pay | Admitting: Internal Medicine

## 2014-10-26 ENCOUNTER — Telehealth: Payer: Self-pay | Admitting: Cardiology

## 2014-10-26 NOTE — Telephone Encounter (Signed)
Spoke to pt regarding upcoming procedure. She says that Dr.Brackbill cleared her from a cardiac standpoint. She says that she does not yet have a date for the procedure, but her and her husband are considering dates in January. I told her that after the procedure is scheduled [facility] will send over a pacemaker clearance form that we will fill out and fax back to them. I encouraged her to call back closer to the time of the procedure if she wanted to confirm that the form was received. Patient voiced understanding.

## 2014-10-26 NOTE — Telephone Encounter (Signed)
Received request from Nurse fax box, documents faxed for surgical clearance. To: [facility] Fax number: [phone] Attention: 11.24.15/km

## 2014-10-26 NOTE — Telephone Encounter (Signed)
See note dated 11-23 for details.

## 2014-10-26 NOTE — Telephone Encounter (Signed)
New message     Returning [name]'s call.  Call home number if before noon and call cell number [phone] after 12:00

## 2014-10-30 ENCOUNTER — Emergency Department (INDEPENDENT_AMBULATORY_CARE_PROVIDER_SITE_OTHER)
Admission: EM | Admit: 2014-10-30 | Discharge: 2014-10-30 | Disposition: A | Discharge status: Home / Self Care | Payer: Medicare Other | Source: Home / Self Care | Attending: Family Medicine | Admitting: Family Medicine

## 2014-10-30 ENCOUNTER — Encounter (HOSPITAL_COMMUNITY): Payer: Self-pay | Admitting: *Deleted

## 2014-10-30 DIAGNOSIS — J020 Streptococcal pharyngitis: Secondary | ICD-10-CM

## 2014-10-30 LAB — POCT RAPID STREP A: Streptococcus, Group A Screen (Direct): NEGATIVE

## 2014-10-30 MED ORDER — CLINDAMYCIN HCL 300 MG PO CAPS: 300.0000 mg | ORAL_CAPSULE | Freq: Three times a day (TID) | ORAL | Status: DC

## 2014-10-30 NOTE — ED Provider Notes (Signed)
CSN: [id number]     Arrival date & time 10/30/14  1457 History   First MD Initiated Contact with Patient 10/30/14 1505     Chief Complaint  Patient presents with  . Sore Throat   (Consider location/radiation/quality/duration/timing/severity/associated sxs/prior Treatment) Patient is a 68 y.o. female presenting with pharyngitis. The history is provided by the patient.  Sore Throat This is a new problem. The current episode started more than 2 days ago. The problem has been gradually worsening. Associated symptoms comments: Sores in roof of mouth, grandson with strep.. The symptoms are aggravated by swallowing.    Past Medical History  Diagnosis Date  . Colon polyp     adenomatous  . Hemorrhoids   . Inguinal hernia   . Hyperlipidemia   . Sleep apnea     can't use c-pap  . Allergic rhinitis   . GERD (gastroesophageal reflux disease)   . Bradycardia     s/p MDT PPM  . Atypical chest pain   . Hypertension   . Pacemaker   . Skin cancer     Basal and squamous cell  . Anemia   . Arthritis   . Blood in stool   . History of chicken pox   . Depression   . Cardiac arrhythmia due to congenital heart disease   . Heart murmur   . Thyroid disease   . UTI (urinary tract infection)   . Osteopenia 12/2012    T score -1.7 FRAX 8.5%/1.1%  . Eye problem     Vitrious detachment   Past Surgical History  Procedure Laterality Date  . Thyroidectomy  1979    RIGHT SIDE  . Polypectomy    . Pacemaker insertion  2006    Dr. Verlon Setting  . Abdominal hysterectomy    . Knee arthroscopy  2011  . Cesarean section    . Dilation and curettage of uterus    . Thumb surgery    . Oophorectomy  1995    LSO and RSO  . Tear duct surgery  2010  . Hand surgery  1992  . Inguinal hernia repair  02/12/1994    left inguinal hernia repair  . Hernia repair     Family History  Problem Relation Age of Onset  . Colon cancer Neg Hx   . Heart failure Sister   . Heart disease Sister   . Hypertension Sister    . Dementia Mother   . Hypertension Mother   . Hypertension Father   . Arthritis Father   . Breast cancer Maternal Aunt     Age 77's  . Diabetes Maternal Aunt   . Diabetes Maternal Uncle   . Ovarian cancer Maternal Aunt    History  Substance Use Topics  . Smoking status: Never Smoker   . Smokeless tobacco: Never Used  . Alcohol Use: No   OB History    Gravida Para Term Preterm AB TAB SAB Ectopic Multiple Living   2 1 1  1  1   1      Review of Systems  Constitutional: Negative.  Negative for fever, chills and appetite change.  HENT: Positive for mouth sores and sore throat.     Allergies  Antihistamines, loratadine-type; Bisoprolol-hydrochlorothiazide; Bystolic; Cephalexin; Codeine; Demerol; Epinephrine; Erythromycin; Hyoscyamine sulfate; Inderal; Lidocaine; Lipitor; Losartan potassium; Morphine; Paxil; Penicillins; Prednisone; Propranolol hcl; Sertraline hcl; Tetracycline; Toprol xl; and Verapamil  Home Medications   Prior to Admission medications   Medication Sig Start Date End Date Taking? Authorizing Provider  Acetaminophen (  TYLENOL PO) Take 1,000 mg by mouth daily as needed (pain).     Historical Provider, MD  BIOTIN PO Take 1 capsule by mouth daily.     Historical Provider, MD  clindamycin (CLEOCIN) 300 MG capsule Take 1 capsule (300 mg total) by mouth 3 (three) times daily. 10/30/14   Billy Fischer, MD  diltiazem (CARDIZEM CD) 120 MG 24 hr capsule Take 1 capsule (120 mg total) by mouth daily. 03/02/14   Darlin Coco, MD  ibuprofen (ADVIL,MOTRIN) 200 MG tablet Take 200 mg by mouth every 6 (six) hours as needed (arthritis).     Historical Provider, MD  LORazepam (ATIVAN) 1 MG tablet TAKE 1 TABLET BY MOUTH 4 TIMES A DAY AS NEEDED Patient taking differently: TAKE 1 TABLET BY MOUTH 4 TIMES A DAY AS NEEDED FOR ANXIETY 07/16/14   Darlin Coco, MD  nitroGLYCERIN (NITROSTAT) 0.4 MG SL tablet Place 1 tablet (0.4 mg total) under the tongue every 5 (five) minutes as needed.  For up to 3 doses 09/03/12   Darlin Coco, MD  nizatidine (AXID) 150 MG capsule Take 1 capsule (150 mg total) by mouth 2 (two) times daily. 01/27/14   Darlin Coco, MD  terconazole (TERAZOL 3) 0.8 % vaginal cream Place 1 applicator vaginally at bedtime. For 3 nights 11/10/13   Anastasio Auerbach, MD   BP 158/77 mmHg  Pulse 73  Temp(Src) 98.2 F (36.8 C) (Oral)  Resp 16  SpO2 95% Physical Exam  Constitutional: She is oriented to person, place, and time. She appears well-developed and well-nourished. No distress.  HENT:  Head: Normocephalic.  Right Ear: External ear normal.  Left Ear: External ear normal.  Nose: Nose normal.  Mouth/Throat: Uvula is midline and mucous membranes are normal. Oral lesions present. Posterior oropharyngeal erythema present. No oropharyngeal exudate or tonsillar abscesses.  Neck: Normal range of motion. Neck supple.  Cardiovascular: Normal heart sounds.   Pulmonary/Chest: Effort normal and breath sounds normal.  Lymphadenopathy:    She has no cervical adenopathy.  Neurological: She is alert and oriented to person, place, and time.  Skin: Skin is warm and dry.  Nursing note and vitals reviewed.   ED Course  Procedures (including critical care time) Labs Review Labs Reviewed  POCT RAPID STREP A ([facility] ONLY)    Imaging Review No results found.   MDM   1. Acute streptococcal pharyngitis        Billy Fischer, MD 10/30/14 1546

## 2014-10-30 NOTE — ED Notes (Signed)
Sorethroat  As  Well   As  Sores  On roof  And  Mouth         And  A  Swollen Uvula         For  sev  Days         Pt  Reports  Has  Had  Some    Sinus  Congestion         As  Well    She  Is  Sitting  Upright  On  Exam  table  In  No  Acute  Distress

## 2014-10-30 NOTE — Discharge Instructions (Signed)
Drink lots of fluids, take all of medicine if you start it with worsening symptoms, use lozenges as needed.return if needed

## 2014-11-01 ENCOUNTER — Ambulatory Visit (INDEPENDENT_AMBULATORY_CARE_PROVIDER_SITE_OTHER): Payer: Medicare Other | Admitting: Family Medicine

## 2014-11-01 ENCOUNTER — Other Ambulatory Visit: Payer: Self-pay | Admitting: Orthopedic Surgery

## 2014-11-01 ENCOUNTER — Telehealth: Payer: Self-pay | Admitting: Family Medicine

## 2014-11-01 ENCOUNTER — Encounter: Payer: Self-pay | Admitting: Family Medicine

## 2014-11-01 VITALS — BP 138/74 | Temp 98.4°F | Wt 121.0 lb

## 2014-11-01 DIAGNOSIS — J029 Acute pharyngitis, unspecified: Secondary | ICD-10-CM

## 2014-11-01 LAB — CULTURE, GROUP A STREP

## 2014-11-01 LAB — POCT RAPID STREP A (OFFICE): Rapid Strep A Screen: NEGATIVE

## 2014-11-01 MED ORDER — MAGIC MOUTHWASH: 5.0000 mL | Freq: Three times a day (TID) | ORAL | Status: DC | PRN

## 2014-11-01 NOTE — Telephone Encounter (Signed)
Patient Information:  Caller Name: Yuktha  Phone: [phone]  Patient: [name]  Gender: Female  DOB: 1946/10/09  Age: 68 Years  PCP: Elsie Stain Brigitte Pulse) [facility])  Office Follow Up:  Does the office need to follow up with this patient?: No  Instructions For The Office: N/A  RN Note:  Pt is requesting appt for today  Symptoms  Reason For Call & Symptoms: Pt is calling and states that she was given a Rx for Clindamycin due to her being exposed to strep throat from her grandson; she did not start this antibiotic yet;  her throat is not sore but she has  skin toned "bumps" on her throat and the throat is red and it feels irritated;  sx are not getting worse  Reviewed Health History In EMR: Yes  Reviewed Medications In EMR: Yes  Reviewed Allergies In EMR: Yes  Reviewed Surgeries / Procedures: Yes  Date of Onset of Symptoms: 10/30/2014  Treatments Tried: salt water and Tylenol; Cepacol  Treatments Tried Worked: No  Guideline(s) Used:  Sore Throat  Disposition Per Guideline:   Strep Test Only Visit Today or Tomorrow  Reason For Disposition Reached:   Strep exposure within last 10 days  Advice Given:  For Relief of Sore Throat Pain:  Sip warm chicken broth or apple juice.  Suck on hard candy or a throat lozenge (over-the-counter).  Gargle warm salt water 3 times daily (1 teaspoon of salt in 8 oz or 240 ml of warm water).  Pain Medicines:  For pain relief, you can take either acetaminophen, ibuprofen, or naproxen.  Pain Medicines:  For pain relief, you can take either acetaminophen, ibuprofen, or naproxen.  Acetaminophen (e.g., Tylenol):  Regular Strength Tylenol: Take 650 mg (two 325 mg pills) by mouth every 4-6 hours as needed. Each Regular Strength Tylenol pill has 325 mg of acetaminophen.  Call Back If:  You become worse.  Patient Will Follow Care Advice:  YES  Appointment Scheduled:  11/02/2014 16:15:00 Appointment Scheduled Provider:  Webb Silversmith Pt requesting a late day appt due to her husband has an appt earlier in the day.

## 2014-11-01 NOTE — Patient Instructions (Signed)
We will call you if strep is positive. Otherwise this is likely viral and will resolve within 7-10 days.   Can use magic mouthwash as long as no reaction-swish and spit ONLY.

## 2014-11-01 NOTE — Progress Notes (Signed)
[name] Reddish, MD Phone: [phone]  Subjective:   [name] [name] is a 68 y.o. year old very pleasant female patient who presents with the following:  Irritated throat Seen by Dr. Juventino Slovak on 10/30/14 at urgent care for 2 days of gradually worsening sore roof of mouth and throat. Grandson with confirmed strep and with him for a week. Describes "irritation" worse than pain. Roof of mouth covered in little clear bumps. Was given clindamcyin for potential strep throat if sore throat portion worsened but did not. Patient did not take clindamycin. Thought symptoms getting better yesterday and then worsened today. Nothing has made symptoms better or worse. She states she has had strep throat before and it was nothing like this. She called in to see if she could get magic mouthwash to alleviate her discomfort but was told needed a visit.    ROS- denies fevers/chills/nausea/vomiting. Mild decreased appetite increase.  Very mild cough  Past Medical History- HTN, sick sinus syndrome with pacemaker, HLD, OSA  Medications- reviewed and updated Current Outpatient Prescriptions  Medication Sig Dispense Refill  . BIOTIN PO Take 1 capsule by mouth daily.     Marland Kitchen diltiazem (CARDIZEM CD) 120 MG 24 hr capsule Take 1 capsule (120 mg total) by mouth daily. 30 capsule 11  . ibuprofen (ADVIL,MOTRIN) 200 MG tablet Take 200 mg by mouth every 6 (six) hours as needed (arthritis).     . LORazepam (ATIVAN) 1 MG tablet TAKE 1 TABLET BY MOUTH 4 TIMES A DAY AS NEEDED (Patient taking differently: TAKE 1 TABLET BY MOUTH 4 TIMES A DAY AS NEEDED FOR ANXIETY) 120 tablet 3  . nizatidine (AXID) 150 MG capsule Take 1 capsule (150 mg total) by mouth 2 (two) times daily. 180 capsule 3  . Acetaminophen (TYLENOL PO) Take 1,000 mg by mouth daily as needed (pain).     . clindamycin (CLEOCIN) 300 MG capsule Take 1 capsule (300 mg total) by mouth 3 (three) times daily. (Patient not taking: Reported on 11/01/2014) 21 capsule 0  .  nitroGLYCERIN (NITROSTAT) 0.4 MG SL tablet Place 1 tablet (0.4 mg total) under the tongue every 5 (five) minutes as needed. For up to 3 doses (Patient not taking: Reported on 11/01/2014) 25 tablet 4  . terconazole (TERAZOL 3) 0.8 % vaginal cream Place 1 applicator vaginally at bedtime. For 3 nights (Patient not taking: Reported on 11/01/2014) 20 g 3    Objective: BP 138/74 mmHg  Temp(Src) 98.4 F (36.9 C)  Wt 121 lb (54.885 kg) Gen: NAD, resting comfortably HEENT: mildly tender right cervical lymph node, tonsils enlarged without exudate, oropharynx with erythematous appearance and small shiny papules on palate.  CV: RRR no murmurs rubs or gallops Lungs: CTAB no crackles, wheeze, rhonchi Abdomen: soft/nontender/nondistended/normal bowel sounds.  Ext: no edema Skin: warm, dry, no rash   Results for orders placed or performed in visit on 11/01/14 (from the past 24 hour(s))  POC Rapid Strep A     Status: None   Collection Time: 11/01/14  3:54 PM  Result Value Ref Range   Rapid Strep A Screen Negative Negative   Assessment/Plan:  Irritated throat/roof of mouth Still denies this being a sore throat but more of an irritation in the roof of her mouth and throat. She feels small bumps. Requests magic mouthwash-spoke with pharmacy and will use benadryl and maalox swish and spit. No lidocaine and no ingestions due to allergy history. Patient with rapid strep negative again today. centor criteria of 1 with swollen tonsils and ?  Tender lymphadenopathy but -1 for age. Sent for culture given sick contact. Patient thinks very unlikely strep as her symptoms usually much more severe but she has clindamycin on hand if needed and for now we will await culture. Treat symptomatically.  Return precautions advised.   Orders Placed This Encounter  Procedures  . Culture, Group A Strep   Meds ordered this encounter  Medications  . Alum & Mag Hydroxide-Simeth (MAGIC MOUTHWASH) SOLN    Sig: Take 5 mLs by  mouth 3 (three) times daily as needed for mouth pain (swish and spit.). Magic mouthwash without lidocaine.    Dispense:  60 mL    Refill:  0

## 2014-11-02 ENCOUNTER — Ambulatory Visit: Payer: Self-pay | Admitting: Internal Medicine

## 2014-11-03 ENCOUNTER — Telehealth: Payer: Self-pay | Admitting: Family Medicine

## 2014-11-03 LAB — CULTURE, GROUP A STREP: Organism ID, Bacteria: NORMAL

## 2014-11-03 NOTE — Telephone Encounter (Signed)
Pt notified and will call Dr. Damita Dunnings

## 2014-11-03 NOTE — Telephone Encounter (Signed)
Have results been reviewed?

## 2014-11-03 NOTE — Telephone Encounter (Signed)
Yes, first review was negative but they will continue to monitor culture for up to 5 days. We will call if positive.

## 2014-11-03 NOTE — Telephone Encounter (Signed)
Would not be clear what we are treating from bacterial perspective. If it was a sore throat issues, then I would say yes but given it is more of a coughing issue-no for now.   I would ask her to follow up if symptoms persist past 10 days or worsen with Dr. Damita Dunnings.

## 2014-11-03 NOTE — Telephone Encounter (Signed)
Patient would to know the results to the culture ordered by Dr. Yong Channel on 11/01/14.

## 2014-11-03 NOTE — Telephone Encounter (Signed)
Pt states she has been coughing up green sputum first thing in the morning and she has a Rx for antibiotic Clindamycin from Urgent Care and she wants to know should she start taking that.

## 2014-11-04 ENCOUNTER — Telehealth: Payer: Self-pay

## 2014-11-04 NOTE — Telephone Encounter (Signed)
PLEASE NOTE: All timestamps contained within this report are represented as [place] Standard Time. CONFIDENTIALTY NOTICE: This fax transmission is intended only for the addressee. It contains information that is legally privileged, confidential or otherwise protected from use or disclosure. If you are not the intended recipient, you are strictly prohibited from reviewing, disclosing, copying using or disseminating any of this information or taking any action in reliance on or regarding this information. If you have received this fax in error, please notify us immediately by telephone so that we can arrange for its return to [place]. Phone: [phone], Toll-Free: [phone], Fax: [phone] Page: 1 of 2 Call Id: [id number] [facility] Patient Name: [name] Gender: Female DOB: [birth year] Age: 68 Y 11 M 11 D Return Phone Number: [phone] (Primary), [phone] (Secondary) Address: [place] City/State/Zip: [facility] [address] Client [place] [facility] - Client Client Site [place] - Day Physician Renford Dills Contact Type Call Call Type Triage / Clinical Relationship To Patient Self Return Phone Number [phone] (Primary) Chief Complaint Eye Pain Initial Comment Caller states she thinks she has pinkeye or some eye infection. PreDisposition Call Doctor Nurse Assessment Nurse: Marcelline Deist, RN, Kermit Balo Date/Time Eilene Ghazi Time): 11/04/2014 9:59:47 AM Confirm and document reason for call. If symptomatic, describe symptoms. ---Caller states she thinks she has pinkeye or some eye infection. Seen at [facility] on Sat, thought it was viral. Had a strep test, too at [facility] - neg. Gave a rx for Clindamycin. Seen again, given magic mouthwash, had sores in mouth. Did not take antibiotic. Has coughing spells in am with green mucus & now right eye  has green discharge & is red. No fever. Has the patient traveled out of the country within the last 30 days? ---Not Applicable Does the patient require triage? ---Yes Related visit to physician within the last 2 weeks? ---Yes Does the PT have any chronic conditions? (i.e. diabetes, asthma, etc.) ---Yes List chronic conditions. ---pacemaker, BP issues Guidelines Guideline Title Affirmed Question Affirmed Notes Nurse Date/Time (Eastern Time) Eye - Pus or Discharge [1] Eye with yellow/green discharge or eyelashes stick together AND [2] NO PCP standing order to call in antibiotic eye drops Marcelline Deist, RN, Kermit Balo 11/04/2014 10:04:38 AM Disp. Time Eilene Ghazi Time) Disposition Final User 11/04/2014 10:12:23 AM Call PCP within 24 Hours Yes Marcelline Deist, RN, Kermit Balo PLEASE NOTE: All timestamps contained within this report are represented as [place] Standard Time. CONFIDENTIALTY NOTICE: This fax transmission is intended only for the addressee. It contains information that is legally privileged, confidential or otherwise protected from use or disclosure. If you are not the intended recipient, you are strictly prohibited from reviewing, disclosing, copying using or disseminating any of this information or taking any action in reliance on or regarding this information. If you have received this fax in error, please notify us immediately by telephone so that we can arrange for its return to [place]. Phone: [phone], Toll-Free: [phone], Fax: [phone] Page: 2 of 2 Call Id: [id number] Caller Understands: Yes Disagree/Comply: Comply Care Advice Given Per Guideline CALL PCP WITHIN 24 HOURS: You need to discuss this with your doctor within the next 24 hours. * IF OFFICE WILL BE OPEN: Call the office when it opens tomorrow morning. PRESCRIPTION ANTIBIOTIC EYEDROPS: * Yellow or green discharge from the eyes is usually treated with a prescription eyedrop. * Since it's not urgent, call your PCP within 24 hours  (9  AM to 5 PM) for a possible prescription. In the meantime, the following measures may help. CARE ADVICE given per Eye - Pus or Discharge (Adult) guideline. CALL BACK IF: * You become worse * More than just mild discomfort * Blurred vision develops * Pus lasts over 3 days (72 hours) on treatment EYELID CLEANSING: * Gently wash eyelids and lashes with warm water and wet cotton balls (or cotton gauze). Remove all the dried and liquid pus. * Do this as often as needed. Comments User: Donald Siva, RN Date/Time Eilene Ghazi Time): 11/04/2014 10:20:04 AM Spoke with appt. desk - they are full today & tomorrow. Pt. advised that if she needs to be seen sooner than her 4:15 pm appt. tomorrow, to be seen at walk-in clinic or UC. Otherwise she can reach a nurse 24/7. caller verbalized understanding. Referrals REFERRED TO PCP OFFICE

## 2014-11-05 ENCOUNTER — Encounter: Payer: Self-pay | Admitting: Family Medicine

## 2014-11-05 ENCOUNTER — Telehealth: Payer: Self-pay | Admitting: Cardiology

## 2014-11-05 ENCOUNTER — Ambulatory Visit (INDEPENDENT_AMBULATORY_CARE_PROVIDER_SITE_OTHER): Payer: Medicare Other | Admitting: Family Medicine

## 2014-11-05 ENCOUNTER — Ambulatory Visit: Payer: Medicare Other | Admitting: Family Medicine

## 2014-11-05 VITALS — BP 110/80 | HR 85 | Temp 98.3°F | Wt 123.5 lb

## 2014-11-05 DIAGNOSIS — J069 Acute upper respiratory infection, unspecified: Secondary | ICD-10-CM

## 2014-11-05 NOTE — Patient Instructions (Signed)
Start the clindamycin and let me know if you aren't improving.  Take care.

## 2014-11-05 NOTE — Telephone Encounter (Signed)
New problem   Pt want to know is she was prescribe clindamycin from Dr Mare Ferrari. Please advise

## 2014-11-05 NOTE — Progress Notes (Signed)
Pre visit review using our clinic review tool, if applicable. No additional management support is needed unless otherwise documented below in the visit note.  To recap: Seen at [facility], strep exposure but likely viral process.  Had clinda rx to hold.  She hadn't started the med yet.    Seen 11/01/14, given magic mouthwash, RST again neg at the OV, culture negative.   D/w pt.    Still with cough, esp in AM.  Wed AM she had green phlegm.  Yesterday the green sputum continued, green rhinorrhea.   Recently with unilateral, R, eye discharge and matting.  Worse in the AM.  Eye is less red today, less matting today.    She has had episcleritis prev but this didn't feel similar to that.  No eye pain.  No vision changes.   The biggest problem now is the dry feeling in the mouth.  Some better with magic mouthwash.  She is using sucrets but that didn't help much.    Meds, vitals, and allergies reviewed.   ROS: See HPI.  Otherwise, noncontributory.  GEN: nad, alert and oriented HEENT: mucous membranes moist, tm w/o erythema, nasal exam w/o erythema, clear discharge noted,  OP with cobblestoning R eye with conjunctival injection, mild.  None on the R.  No discharge or FB B.  PERRL, EOMI NECK: supple w/o LA CV: rrr.   PULM: ctab, no inc wob EXT: no edema SKIN: no acute rash

## 2014-11-05 NOTE — Telephone Encounter (Signed)
Patient phoned wanting to know if Clindamycin is what she took for premed years ago. Advised patient unable to view that in Epic since premed no longer needed secondary to new guidelines. Patient was Rx'd this for infection and has been taking. Patient stated she would continue taking

## 2014-11-05 NOTE — Telephone Encounter (Signed)
Seeing patient today

## 2014-11-07 DIAGNOSIS — J069 Acute upper respiratory infection, unspecified: Secondary | ICD-10-CM | POA: Insufficient documentation

## 2014-11-07 NOTE — Assessment & Plan Note (Signed)
She's nontoxic, but at this point I would start the clinda.  D/w pt.  The main point of today was to check he chest and make sure she was clear.  She is. Okay for outpatient f/u.  She does appear to have pinkeye, but clinda would be a reasonable choice by mouth with other sx ongoing.  She agrees.   F/u prn.  Supportive tx o/w.

## 2014-11-08 ENCOUNTER — Telehealth: Payer: Self-pay

## 2014-11-08 NOTE — Telephone Encounter (Signed)
pt left v/m; pt was seen 11/05/14; pt is taking clindamycin 300 mg twice a day due to stomach irritation and ache or indigestion, no diarrhea;pt is taking with food and pt wants to know if has to take all of clindamycin since irritating stomach? pt is feeling better; now has non prod cough and not as often, no wheezing or SOB. Rt eye is cleared with no problem.pt request cb.

## 2014-11-08 NOTE — Telephone Encounter (Signed)
See if she can get by taking the med BID for another few (ie 2-3) days.  If cough and chest resolved at that point, then stop.  If she can't tolerate the medicine at all, then stop sooner.  Thanks.

## 2014-11-08 NOTE — Telephone Encounter (Signed)
Patient advised.

## 2014-11-16 ENCOUNTER — Encounter: Payer: Self-pay | Admitting: Gynecology

## 2014-11-16 ENCOUNTER — Ambulatory Visit (INDEPENDENT_AMBULATORY_CARE_PROVIDER_SITE_OTHER): Payer: Medicare Other | Admitting: Gynecology

## 2014-11-16 VITALS — BP 120/76 | Ht 65.0 in | Wt 125.0 lb

## 2014-11-16 DIAGNOSIS — M8580 Other specified disorders of bone density and structure, unspecified site: Secondary | ICD-10-CM

## 2014-11-16 DIAGNOSIS — N3941 Urge incontinence: Secondary | ICD-10-CM

## 2014-11-16 DIAGNOSIS — N952 Postmenopausal atrophic vaginitis: Secondary | ICD-10-CM

## 2014-11-16 NOTE — Progress Notes (Signed)
[name] [birth year] [id number]        68 y.o.  G2P1011 for follow up exam. Several issues noted below.  Past medical history,surgical history, problem list, medications, allergies, family history and social history were all reviewed and documented as reviewed in the EPIC chart.  ROS:  12 system ROS performed with pertinent positives and negatives included in the history, assessment and plan.   Additional significant findings :  none   Exam: Kim Counsellor Vitals:   11/16/14 1429  BP: 120/76  Height: 5\' 5"  (1.651 m)  Weight: 125 lb (56.7 kg)   General appearance:  Normal affect, orientation and appearance. Skin: Grossly normal HEENT: Without gross lesions.  No cervical or supraclavicular adenopathy. Thyroid normal.  Lungs:  Clear without wheezing, rales or rhonchi Cardiac: RR, without RMG Abdominal:  Soft, nontender, without masses, guarding, rebound, organomegaly or hernia Breasts:  Examined lying and sitting without masses, retractions, discharge or axillary adenopathy. Pelvic:  Ext/BUS/vagina with generalized atrophic changes.  Adnexa  Without masses or tenderness    Anus and perineum  Normal   Rectovaginal  Normal sphincter tone without palpated masses or tenderness.    Assessment/Plan:  68 y.o. G77P1011 female for follow up exam.   1. Urge incontinence. Patient having some issues with urgency.  When she feels like she has to void she has to make it to the bathroom quickly or can have some loss of urine. Is not having stress symptoms such as loss of urine with coughing sneezing laughing. No other symptoms such as frequency dysuria. No use of caffeine or carbonated beverages. Will check urinalysis. Options for management to include OAB medications discussed. Side effect profile reviewed. At this point patient declines and would prefer to monitor and try some behavior modifications like more frequent timed urination to keep her bladder empty.  She will call if she changes  her mind as far as OAB medication. 2. Osteopenia. DEXA 12/2012 T score -1.7 FRAX 8.5%/1.1%.  Stable from prior DEXA 2007. Increased calcium vitamin D reviewed. Repeat DEXA in several years. 3. Mammography 01/2014. Continue with annual mammography. SBE monthly reviewed. 4. Pap smear 2012.  No Pap smear done today. No history of significant abnormal Pap smears. Status post hysterectomy for benign indications. Options to stop screening altogether she is over the age of 40 and status post hysterectomy versus less frequent screening intervals reviewed. At this point we both feel comfortable with stop screening. 5. Postmenopausal/atrophic genital changes.  No significant hot flashes or night sweats. Was having some issues with vaginal dryness but this no longer seems to be a big issue.  Is not interested in vaginal estrogen supplementation. Will monitor and call becomes an issue. 6. Colonoscopy 2013. Repeat at their recommended interval. 7. Health maintenance. No routine blood work done as this is done at her primary physician's office. Follow up 1 year, sooner as needed.     Anastasio Auerbach MD, 2:56 PM 11/16/2014

## 2014-11-16 NOTE — Patient Instructions (Signed)
You may obtain a copy of any labs that were done today by logging onto MyChart as outlined in the instructions provided with your AVS (after visit summary). The office will not call with normal lab results but certainly if there are any significant abnormalities then we will contact you.   Health Maintenance, Female A healthy lifestyle and preventative care can promote health and wellness.  Maintain regular health, dental, and eye exams.  Eat a healthy diet. Foods like vegetables, fruits, whole grains, low-fat dairy products, and lean protein foods contain the nutrients you need without too many calories. Decrease your intake of foods high in solid fats, added sugars, and salt. Get information about a proper diet from your caregiver, if necessary.  Regular physical exercise is one of the most important things you can do for your health. Most adults should get at least 150 minutes of moderate-intensity exercise (any activity that increases your heart rate and causes you to sweat) each week. In addition, most adults need muscle-strengthening exercises on 2 or more days a week.   Maintain a healthy weight. The body mass index (BMI) is a screening tool to identify possible weight problems. It provides an estimate of body fat based on height and weight. Your caregiver can help determine your BMI, and can help you achieve or maintain a healthy weight. For adults 20 years and older:  A BMI below 18.5 is considered underweight.  A BMI of 18.5 to 24.9 is normal.  A BMI of 25 to 29.9 is considered overweight.  A BMI of 30 and above is considered obese.  Maintain normal blood lipids and cholesterol by exercising and minimizing your intake of saturated fat. Eat a balanced diet with plenty of fruits and vegetables. Blood tests for lipids and cholesterol should begin at age 61 and be repeated every 5 years. If your lipid or cholesterol levels are high, you are over 50, or you are a high risk for heart  disease, you may need your cholesterol levels checked more frequently.Ongoing high lipid and cholesterol levels should be treated with medicines if diet and exercise are not effective.  If you smoke, find out from your caregiver how to quit. If you do not use tobacco, do not start.  Lung cancer screening is recommended for adults aged 33 80 years who are at high risk for developing lung cancer because of a history of smoking. Yearly low-dose computed tomography (CT) is recommended for people who have at least a 30-pack-year history of smoking and are a current smoker or have quit within the past 15 years. A pack year of smoking is smoking an average of 1 pack of cigarettes a day for 1 year (for example: 1 pack a day for 30 years or 2 packs a day for 15 years). Yearly screening should continue until the smoker has stopped smoking for at least 15 years. Yearly screening should also be stopped for people who develop a health problem that would prevent them from having lung cancer treatment.  If you are pregnant, do not drink alcohol. If you are breastfeeding, be very cautious about drinking alcohol. If you are not pregnant and choose to drink alcohol, do not exceed 1 drink per day. One drink is considered to be 12 ounces (355 mL) of beer, 5 ounces (148 mL) of wine, or 1.5 ounces (44 mL) of liquor.  Avoid use of street drugs. Do not share needles with anyone. Ask for help if you need support or instructions about stopping  the use of drugs.  High blood pressure causes heart disease and increases the risk of stroke. Blood pressure should be checked at least every 1 to 2 years. Ongoing high blood pressure should be treated with medicines, if weight loss and exercise are not effective.  If you are 59 to 67 years old, ask your caregiver if you should take aspirin to prevent strokes.  Diabetes screening involves taking a blood sample to check your fasting blood sugar level. This should be done once every 3  years, after age 91, if you are within normal weight and without risk factors for diabetes. Testing should be considered at a younger age or be carried out more frequently if you are overweight and have at least 1 risk factor for diabetes.  Breast cancer screening is essential preventative care for women. You should practice "breast self-awareness." This means understanding the normal appearance and feel of your breasts and may include breast self-examination. Any changes detected, no matter how small, should be reported to a caregiver. Women in their 66s and 30s should have a clinical breast exam (CBE) by a caregiver as part of a regular health exam every 1 to 3 years. After age 101, women should have a CBE every year. Starting at age 100, women should consider having a mammogram (breast X-ray) every year. Women who have a family history of breast cancer should talk to their caregiver about genetic screening. Women at a high risk of breast cancer should talk to their caregiver about having an MRI and a mammogram every year.  Breast cancer gene (BRCA)-related cancer risk assessment is recommended for women who have family members with BRCA-related cancers. BRCA-related cancers include breast, ovarian, tubal, and peritoneal cancers. Having family members with these cancers may be associated with an increased risk for harmful changes (mutations) in the breast cancer genes BRCA1 and BRCA2. Results of the assessment will determine the need for genetic counseling and BRCA1 and BRCA2 testing.  The Pap test is a screening test for cervical cancer. Women should have a Pap test starting at age 57. Between ages 25 and 35, Pap tests should be repeated every 2 years. Beginning at age 37, you should have a Pap test every 3 years as long as the past 3 Pap tests have been normal. If you had a hysterectomy for a problem that was not cancer or a condition that could lead to cancer, then you no longer need Pap tests. If you are  between ages 50 and 76, and you have had normal Pap tests going back 10 years, you no longer need Pap tests. If you have had past treatment for cervical cancer or a condition that could lead to cancer, you need Pap tests and screening for cancer for at least 20 years after your treatment. If Pap tests have been discontinued, risk factors (such as a new sexual partner) need to be reassessed to determine if screening should be resumed. Some women have medical problems that increase the chance of getting cervical cancer. In these cases, your caregiver may recommend more frequent screening and Pap tests.  The human papillomavirus (HPV) test is an additional test that may be used for cervical cancer screening. The HPV test looks for the virus that can cause the cell changes on the cervix. The cells collected during the Pap test can be tested for HPV. The HPV test could be used to screen women aged 44 years and older, and should be used in women of any age  who have unclear Pap test results. After the age of 55, women should have HPV testing at the same frequency as a Pap test.  Colorectal cancer can be detected and often prevented. Most routine colorectal cancer screening begins at the age of 44 and continues through age 20. However, your caregiver may recommend screening at an earlier age if you have risk factors for colon cancer. On a yearly basis, your caregiver may provide home test kits to check for hidden blood in the stool. Use of a small camera at the end of a tube, to directly examine the colon (sigmoidoscopy or colonoscopy), can detect the earliest forms of colorectal cancer. Talk to your caregiver about this at age 86, when routine screening begins. Direct examination of the colon should be repeated every 5 to 10 years through age 13, unless early forms of pre-cancerous polyps or small growths are found.  Hepatitis C blood testing is recommended for all people born from [birth year] through 1964 and any  individual with known risks for hepatitis C.  Practice safe sex. Use condoms and avoid high-risk sexual practices to reduce the spread of sexually transmitted infections (STIs). Sexually active women aged 36 and younger should be checked for Chlamydia, which is a common sexually transmitted infection. Older women with new or multiple partners should also be tested for Chlamydia. Testing for other STIs is recommended if you are sexually active and at increased risk.  Osteoporosis is a disease in which the bones lose minerals and strength with aging. This can result in serious bone fractures. The risk of osteoporosis can be identified using a bone density scan. Women ages 20 and over and women at risk for fractures or osteoporosis should discuss screening with their caregivers. Ask your caregiver whether you should be taking a calcium supplement or vitamin D to reduce the rate of osteoporosis.  Menopause can be associated with physical symptoms and risks. Hormone replacement therapy is available to decrease symptoms and risks. You should talk to your caregiver about whether hormone replacement therapy is right for you.  Use sunscreen. Apply sunscreen liberally and repeatedly throughout the day. You should seek shade when your shadow is shorter than you. Protect yourself by wearing long sleeves, pants, a wide-brimmed hat, and sunglasses year round, whenever you are outdoors.  Notify your caregiver of new moles or changes in moles, especially if there is a change in shape or color. Also notify your caregiver if a mole is larger than the size of a pencil eraser.  Stay current with your immunizations. Document Released: 06/04/2011 Document Revised: 03/16/2013 Document Reviewed: 06/04/2011 [facility] Patient Information 2014 [employer].

## 2014-11-16 NOTE — Addendum Note (Signed)
Addended by: Joaquin Music on: 11/16/2014 05:13 PM   Modules accepted: Orders

## 2014-11-17 ENCOUNTER — Ambulatory Visit: Payer: Medicare Other | Admitting: *Deleted

## 2014-11-17 ENCOUNTER — Telehealth: Payer: Self-pay | Admitting: Cardiology

## 2014-11-17 DIAGNOSIS — I495 Sick sinus syndrome: Secondary | ICD-10-CM

## 2014-11-17 LAB — MDC_IDC_ENUM_SESS_TYPE_REMOTE
Battery Impedance: 4652 Ohm
Battery Remaining Longevity: 7 mo
Brady Statistic AP VP Percent: 0 %
Brady Statistic AP VS Percent: 51 %
Brady Statistic AS VP Percent: 0 %
Brady Statistic AS VS Percent: 49 %
Date Time Interrogation Session: 20151216195926
Lead Channel Impedance Value: 589 Ohm
Lead Channel Impedance Value: 608 Ohm
Lead Channel Pacing Threshold Pulse Width: 0.4 ms
Lead Channel Pacing Threshold Pulse Width: 0.4 ms
Lead Channel Sensing Intrinsic Amplitude: 2.8 mV
Lead Channel Setting Pacing Pulse Width: 0.4 ms
Lead Channel Setting Sensing Sensitivity: 5.6 mV
MDC IDC MSMT BATTERY VOLTAGE: 2.63 V
MDC IDC MSMT LEADCHNL RA PACING THRESHOLD AMPLITUDE: 0.75 V
MDC IDC MSMT LEADCHNL RV PACING THRESHOLD AMPLITUDE: 0.5 V
MDC IDC MSMT LEADCHNL RV SENSING INTR AMPL: 16 mV
MDC IDC SET LEADCHNL RA PACING AMPLITUDE: 2 V
MDC IDC SET LEADCHNL RV PACING AMPLITUDE: 2.5 V

## 2014-11-17 LAB — URINALYSIS W MICROSCOPIC + REFLEX CULTURE
BILIRUBIN URINE: NEGATIVE
Bacteria, UA: NONE SEEN
CRYSTALS: NONE SEEN
Casts: NONE SEEN
Glucose, UA: NEGATIVE mg/dL
Hgb urine dipstick: NEGATIVE
KETONES UR: NEGATIVE mg/dL
Leukocytes, UA: NEGATIVE
NITRITE: NEGATIVE
PH: 5 (ref 5.0–8.0)
Protein, ur: NEGATIVE mg/dL
SPECIFIC GRAVITY, URINE: 1.01 (ref 1.005–1.030)
SQUAMOUS EPITHELIAL / LPF: NONE SEEN
Urobilinogen, UA: 0.2 mg/dL (ref 0.0–1.0)

## 2014-11-17 NOTE — Telephone Encounter (Signed)
Spoke with pt and reminded pt of remote transmission that is due today. Pt verbalized understanding.   

## 2014-11-17 NOTE — Progress Notes (Signed)
Remote pacemaker transmission.   

## 2014-11-18 ENCOUNTER — Telehealth: Payer: Self-pay

## 2014-11-18 MED ORDER — CLINDAMYCIN HCL 300 MG PO CAPS: 300.0000 mg | ORAL_CAPSULE | Freq: Three times a day (TID) | ORAL | Status: DC

## 2014-11-18 NOTE — Telephone Encounter (Signed)
[name] left v/m; pt was seen 11/05/14, pt finished abx; on 11/16/14 pt started again with congestion and coughing . Pt has already scheduled appt on 11/19/14 for f/u. [name] wants to know if Dr Damita Dunnings wants to call pt med in or should pt keep appt. [employer].

## 2014-11-18 NOTE — Telephone Encounter (Signed)
Patient advised.

## 2014-11-18 NOTE — Telephone Encounter (Signed)
I sent the clindamycin. If she has thick discolored sputum or rhinorrhea, then I would go ahead and start it.   Okay to keep the appointment tomorrow or push back to early next week if she wants to give it more time.  Thanks.

## 2014-11-18 NOTE — Progress Notes (Signed)
Charges deleted. Monthly battery check only. Next billable Feb/2016.

## 2014-11-19 ENCOUNTER — Encounter: Payer: Self-pay | Admitting: Family Medicine

## 2014-11-19 ENCOUNTER — Ambulatory Visit (INDEPENDENT_AMBULATORY_CARE_PROVIDER_SITE_OTHER): Payer: Medicare Other | Admitting: Family Medicine

## 2014-11-19 VITALS — BP 132/84 | HR 82 | Temp 97.7°F | Wt 126.5 lb

## 2014-11-19 DIAGNOSIS — R059 Cough, unspecified: Secondary | ICD-10-CM

## 2014-11-19 DIAGNOSIS — R05 Cough: Secondary | ICD-10-CM

## 2014-11-19 NOTE — Progress Notes (Signed)
Pre visit review using our clinic review tool, if applicable. No additional management support is needed unless otherwise documented below in the visit note.  She was ill prev, took clinda and got better except for fatigue.  She had a few "decent" days then started back with cough, getting stuffy.  More sputum recently, thick and green.  Cough is really bothersome to the patient.  She noted being more gassy after restarting clinda.   Her diet is off, appetite is affected.  She is sore from coughing.   She tried tea and honey for the cough w/o a lot of relief.   No fevers.  We talked about options re: cough medicines.   Her cough is some better today.   Meds, vitals, and allergies reviewed.   ROS: See HPI.  Otherwise, noncontributory.  nad Tm wnl Sinuses not ttp OP with minimal cobblestoning.  Minimal nasal irritation.  MMM Neck supple, no LA rrr Ctab, no wheeze, no rales.   Speaking in complete sentences.  No edema

## 2014-11-19 NOTE — Patient Instructions (Signed)
I would plain robitussin and try to keep taking the clindamycin.   That should help.  Drink plenty of fluids in the meantime.  Take care.  Update me as needed.

## 2014-11-21 DIAGNOSIS — R059 Cough, unspecified: Secondary | ICD-10-CM | POA: Insufficient documentation

## 2014-11-21 DIAGNOSIS — R05 Cough: Secondary | ICD-10-CM | POA: Insufficient documentation

## 2014-11-21 NOTE — Assessment & Plan Note (Addendum)
Cough with purulent sputum, never fully resolved from prev.   D/w pt about plain robitussin and try to keep taking the clindamycin, she has rx.  Drink plenty of fluids in the meantime.  F/u prn.  No need to image today.  It wouldn't change mgmt.  She agrees.

## 2014-11-29 ENCOUNTER — Telehealth: Payer: Self-pay | Admitting: Cardiology

## 2014-11-29 NOTE — Telephone Encounter (Signed)
Gave pt results from December 2015 transmission results. Pt verbalized understanding.

## 2014-12-07 ENCOUNTER — Ambulatory Visit (HOSPITAL_COMMUNITY)
Admission: RE | Admit: 2014-12-07 | Discharge: 2014-12-07 | Disposition: A | Discharge status: Home / Self Care | Payer: Medicare Other | Source: Ambulatory Visit | Attending: Orthopedic Surgery | Admitting: Orthopedic Surgery

## 2014-12-07 ENCOUNTER — Encounter (HOSPITAL_COMMUNITY)
Admission: RE | Admit: 2014-12-07 | Discharge: 2014-12-07 | Disposition: A | Discharge status: Home / Self Care | Payer: Medicare Other | Source: Ambulatory Visit | Attending: Orthopedic Surgery | Admitting: Orthopedic Surgery

## 2014-12-07 ENCOUNTER — Encounter (HOSPITAL_COMMUNITY): Payer: Self-pay

## 2014-12-07 DIAGNOSIS — C4490 Unspecified malignant neoplasm of skin, unspecified: Secondary | ICD-10-CM | POA: Diagnosis not present

## 2014-12-07 DIAGNOSIS — M1990 Unspecified osteoarthritis, unspecified site: Secondary | ICD-10-CM | POA: Insufficient documentation

## 2014-12-07 DIAGNOSIS — I340 Nonrheumatic mitral (valve) insufficiency: Secondary | ICD-10-CM | POA: Insufficient documentation

## 2014-12-07 DIAGNOSIS — I700 Atherosclerosis of aorta: Secondary | ICD-10-CM | POA: Diagnosis not present

## 2014-12-07 DIAGNOSIS — Z96642 Presence of left artificial hip joint: Secondary | ICD-10-CM | POA: Insufficient documentation

## 2014-12-07 DIAGNOSIS — R011 Cardiac murmur, unspecified: Secondary | ICD-10-CM | POA: Insufficient documentation

## 2014-12-07 DIAGNOSIS — Z8249 Family history of ischemic heart disease and other diseases of the circulatory system: Secondary | ICD-10-CM | POA: Insufficient documentation

## 2014-12-07 DIAGNOSIS — D649 Anemia, unspecified: Secondary | ICD-10-CM | POA: Diagnosis not present

## 2014-12-07 DIAGNOSIS — G43909 Migraine, unspecified, not intractable, without status migrainosus: Secondary | ICD-10-CM | POA: Insufficient documentation

## 2014-12-07 DIAGNOSIS — K219 Gastro-esophageal reflux disease without esophagitis: Secondary | ICD-10-CM | POA: Diagnosis not present

## 2014-12-07 DIAGNOSIS — Z950 Presence of cardiac pacemaker: Secondary | ICD-10-CM | POA: Diagnosis not present

## 2014-12-07 DIAGNOSIS — Z01818 Encounter for other preprocedural examination: Secondary | ICD-10-CM | POA: Insufficient documentation

## 2014-12-07 DIAGNOSIS — F329 Major depressive disorder, single episode, unspecified: Secondary | ICD-10-CM | POA: Insufficient documentation

## 2014-12-07 DIAGNOSIS — I341 Nonrheumatic mitral (valve) prolapse: Secondary | ICD-10-CM | POA: Diagnosis not present

## 2014-12-07 DIAGNOSIS — E785 Hyperlipidemia, unspecified: Secondary | ICD-10-CM | POA: Insufficient documentation

## 2014-12-07 DIAGNOSIS — R001 Bradycardia, unspecified: Secondary | ICD-10-CM | POA: Insufficient documentation

## 2014-12-07 DIAGNOSIS — Z90710 Acquired absence of both cervix and uterus: Secondary | ICD-10-CM | POA: Diagnosis not present

## 2014-12-07 DIAGNOSIS — K449 Diaphragmatic hernia without obstruction or gangrene: Secondary | ICD-10-CM | POA: Insufficient documentation

## 2014-12-07 DIAGNOSIS — E890 Postprocedural hypothyroidism: Secondary | ICD-10-CM | POA: Diagnosis not present

## 2014-12-07 DIAGNOSIS — G4733 Obstructive sleep apnea (adult) (pediatric): Secondary | ICD-10-CM | POA: Insufficient documentation

## 2014-12-07 HISTORY — DX: Other specified postprocedural states: Z98.890

## 2014-12-07 HISTORY — DX: Unspecified scleritis, right eye: H15.001

## 2014-12-07 HISTORY — DX: Family history of other specified conditions: Z84.89

## 2014-12-07 HISTORY — DX: Nausea with vomiting, unspecified: R11.2

## 2014-12-07 HISTORY — DX: Vitreous degeneration, bilateral: H43.813

## 2014-12-07 LAB — COMPREHENSIVE METABOLIC PANEL
ALT: 17 U/L (ref 0–35)
AST: 26 U/L (ref 0–37)
Albumin: 4.5 g/dL (ref 3.5–5.2)
Alkaline Phosphatase: 55 U/L (ref 39–117)
Anion gap: 9 (ref 5–15)
BUN: 17 mg/dL (ref 6–23)
CO2: 27 mmol/L (ref 19–32)
Calcium: 9.6 mg/dL (ref 8.4–10.5)
Chloride: 101 mEq/L (ref 96–112)
Creatinine, Ser: 0.82 mg/dL (ref 0.50–1.10)
GFR calc Af Amer: 84 mL/min — ABNORMAL LOW (ref >90)
GFR, EST NON AFRICAN AMERICAN: 72 mL/min — AB (ref >90)
Glucose, Bld: 72 mg/dL (ref 70–99)
Potassium: 3.6 mmol/L (ref 3.5–5.1)
Sodium: 137 mmol/L (ref 135–145)
TOTAL PROTEIN: 7 g/dL (ref 6.0–8.3)
Total Bilirubin: 0.4 mg/dL (ref 0.3–1.2)

## 2014-12-07 LAB — CBC WITH DIFFERENTIAL/PLATELET
BASOS ABS: 0 10*3/uL (ref 0.0–0.1)
Basophils Relative: 0 % (ref 0–1)
EOS ABS: 0.1 10*3/uL (ref 0.0–0.7)
EOS PCT: 1 % (ref 0–5)
HEMATOCRIT: 41 % (ref 36.0–46.0)
HEMOGLOBIN: 13.7 g/dL (ref 12.0–15.0)
Lymphocytes Relative: 34 % (ref 12–46)
Lymphs Abs: 1.7 10*3/uL (ref 0.7–4.0)
MCH: 31.3 pg (ref 26.0–34.0)
MCHC: 33.4 g/dL (ref 30.0–36.0)
MCV: 93.6 fL (ref 78.0–100.0)
Monocytes Absolute: 0.4 10*3/uL (ref 0.1–1.0)
Monocytes Relative: 7 % (ref 3–12)
Neutro Abs: 2.9 10*3/uL (ref 1.7–7.7)
Neutrophils Relative %: 58 % (ref 43–77)
PLATELETS: 205 10*3/uL (ref 150–400)
RBC: 4.38 MIL/uL (ref 3.87–5.11)
RDW: 12.2 % (ref 11.5–15.5)
WBC: 5.1 10*3/uL (ref 4.0–10.5)

## 2014-12-07 LAB — URINALYSIS, ROUTINE W REFLEX MICROSCOPIC
Bilirubin Urine: NEGATIVE
Glucose, UA: NEGATIVE mg/dL
Hgb urine dipstick: NEGATIVE
KETONES UR: NEGATIVE mg/dL
LEUKOCYTES UA: NEGATIVE
Nitrite: NEGATIVE
Protein, ur: NEGATIVE mg/dL
SPECIFIC GRAVITY, URINE: 1.007 (ref 1.005–1.030)
Urobilinogen, UA: 0.2 mg/dL (ref 0.0–1.0)
pH: 5.5 (ref 5.0–8.0)

## 2014-12-07 LAB — SURGICAL PCR SCREEN
MRSA, PCR: NEGATIVE
Staphylococcus aureus: NEGATIVE

## 2014-12-07 LAB — APTT: aPTT: 32 seconds (ref 24–37)

## 2014-12-07 LAB — PROTIME-INR
INR: 1.05 (ref 0.00–1.49)
Prothrombin Time: 13.8 seconds (ref 11.6–15.2)

## 2014-12-07 NOTE — Pre-Procedure Instructions (Signed)
[name]  12/07/2014   Your procedure is scheduled on:  Monday  12/20/14  Report to [facility] Admitting at 645 AM.  Call this number if you have problems the morning of surgery: [phone]   Remember:   Do not eat food or drink liquids after midnight.   Take these medicines the morning of surgery with A SIP OF WATER:  TYLENOL, DILTIAZEM, LORAZEPAM, AXID(STOP IBUPROFEN)   Do not wear jewelry, make-up or nail polish.  Do not wear lotions, powders, or perfumes. You may wear deodorant.  Do not shave 48 hours prior to surgery. Men may shave face and neck.  Do not bring valuables to the hospital.  [facility] is not responsible                  for any belongings or valuables.               Contacts, dentures or bridgework may not be worn into surgery.  Leave suitcase in the car. After surgery it may be brought to your room.  For patients admitted to the hospital, discharge time is determined by your                treatment team.               Patients discharged the day of surgery will not be allowed to drive  home.  Name and phone number of your driver:   Special Instructions: Shower using CHG 2 nights before surgery and the night before surgery.  If you shower the day of surgery use CHG.  Use special wash - you have one bottle of CHG for all showers.  You should use approximately 1/3 of the bottle for each shower.   Please read over the following fact sheets that you were given: Pain Booklet, Coughing and Deep Breathing, Total Joint Packet, MRSA Information and Surgical Site Infection Prevention

## 2014-12-07 NOTE — Progress Notes (Addendum)
Anesthesia PAT Evaluation:  Patient is a 69 year old female scheduled for right TKA on 12/20/14 by Dr. Ronnie Derby.  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 PCP is Dr. Elsie Stain who cleared her for surgery from a medical standpoint. Pulmonologist is Dr. Baird Lyons. Cardiologist is Dr. Mare Ferrari, last visit 09/24/14, who cleared her from a cardiac standpoint.  EP cardiologist is Dr. Rayann Heman (last visit 10/18/14) who felt patient was "OK to proceed from a CV standpoint."  Patient has a family history of MALIGNANT HYPERTHERMIA in her uncle (mother's brother; 20-30 years ago) and her nephew (sister's son--biopsy proven in [place]; occurred ~ 76 years ago when he was ~ 68 years old).  Patient denies personal MH reaction, but MH precautions were used with prior surgeries. Her most recent surgery was a knee arthroscopy at a local surgical center. (I attempted to obtain 2011 anesthesia records, but there was no records for her at the [facility] [place], [facility], or [facility]. Dr. Ruel Favors office to let me know if they find any records.). She also tends to get cold/shivering after surgery.  According to Dr. Sherryl Barters 04/19/11 note, "Her last echocardiogram was 09/19/07 and showed an ejection fraction of 55-60% and mild aortic sclerosis and trace mitral regurgitation. Her last nuclear stress test was a one-day [place] study on 05/17/09 showing no ischemia."  She denies chest pain, syncope, edema.  She will feel briefly SOB during periods of emotional stress, but nothing prolonged or persistent.  She also gets occasional brief episodes of palpitations. She has a left sided PPM.  On exam, she is  thin. Heart RRR. I did not appreciate a murmur.  Lungs are clear. No significant LE edema.  09/24/14 EKG: a-paced, LAFB, possible anterior infarct (age undetermined).  12/08/14 CXR: No acute cardiopulmonary process seen.  Preoperative labs noted. Urine culture is pending.  I notified Dr. Ruel Favors office of patient's family history of MH.  He had anticipated GA with block, but said he is okay if the anesthesiologist would prefer to do spinal anesthesia due to her history.  I discussed options with patient.  She does not have any known contraindications for spinal anesthesia, but tends to be an anxious person so feels she may tolerate GA better.  She is scheduled for her preoperative visit at Dr. Ruel Favors office on 12/10/13.  She prefers GA, but understands the definitive anesthesia plan will be determined following her anesthesiologist evaluation on the day of surgery.  Her MH history was documented on the anesthesiologist communication board.  Although she is an 0845 start time, she is currently the first case in room 6 for that day.  I have given patient contact information to [facility] Department of Anesthesiology if she desires further evaluation/testing for MH.   George [facility] Short Stay Center/Anesthesiology Phone [phone] 12/07/2014 7:37 PM

## 2014-12-07 NOTE — Progress Notes (Signed)
PATIENT WAS GIVEN INFO TO SEE VIDEO FOR KNEE REPLACEMENT.

## 2014-12-09 ENCOUNTER — Encounter: Payer: Self-pay | Admitting: Cardiology

## 2014-12-09 LAB — URINE CULTURE
CULTURE: NO GROWTH
Colony Count: NO GROWTH

## 2014-12-13 ENCOUNTER — Encounter: Payer: Self-pay | Admitting: Internal Medicine

## 2014-12-19 MED ORDER — CHLORHEXIDINE GLUCONATE 4 % EX LIQD
60.0000 mL | Freq: Once | CUTANEOUS | Status: DC
  Filled 2014-12-19: qty 60

## 2014-12-19 MED ORDER — VANCOMYCIN HCL IN DEXTROSE 1-5 GM/200ML-% IV SOLN
1000.0000 mg | INTRAVENOUS | Status: AC
  Administered 2014-12-20: 1000 mg via INTRAVENOUS
  Filled 2014-12-19: qty 200

## 2014-12-19 MED ORDER — TRANEXAMIC ACID 100 MG/ML IV SOLN
2000.0000 mg | INTRAVENOUS | Status: DC
  Filled 2014-12-19: qty 20

## 2014-12-19 NOTE — Anesthesia Preprocedure Evaluation (Addendum)
Anesthesia Evaluation  Patient identified by MRN, date of birth, ID band Patient awake    Reviewed: Allergy & Precautions, NPO status , Patient's Chart, lab work & pertinent test results  History of Anesthesia Complications (+) PONV and MALIGNANT HYPERTHERMIA  Airway        Dental   Pulmonary sleep apnea ,          Cardiovascular hypertension, Pt. on medications + pacemaker  EF and STRESS 2010 OK, Pacer for SSS   Neuro/Psych Anxiety Depression    GI/Hepatic GERD-  Medicated,  Endo/Other    Renal/GU      Musculoskeletal   Abdominal   Peds  Hematology 13/41   Anesthesia Other Findings   Reproductive/Obstetrics                            Anesthesia Physical Anesthesia Plan  ASA: II  Anesthesia Plan: Spinal   Post-op Pain Management:    Induction:   Airway Management Planned: Nasal Cannula  Additional Equipment:   Intra-op Plan:   Post-operative Plan:   Informed Consent: I have reviewed the patients History and Physical, chart, labs and discussed the procedure including the risks, benefits and alternatives for the proposed anesthesia with the patient or authorized representative who has indicated his/her understanding and acceptance.     Plan Discussed with:   Anesthesia Plan Comments: (Uncle and Nephew on sister,s sidw with BX proven MH, she has had non triggering anesthetics.  Reports numerous drug intolerances and or allergy, also high anxiety.  Would prefer to do Spinal with Propofol sedation if ok with patient or GA, with non trigers and femoral nerve block)       Anesthesia Quick Evaluation

## 2014-12-20 ENCOUNTER — Encounter (HOSPITAL_COMMUNITY)
Admission: RE | Disposition: A | Discharge status: Home / Self Care | Payer: Self-pay | Source: Ambulatory Visit | Attending: Orthopedic Surgery

## 2014-12-20 ENCOUNTER — Inpatient Hospital Stay (HOSPITAL_COMMUNITY): Payer: Medicare Other | Admitting: Certified Registered Nurse Anesthetist

## 2014-12-20 ENCOUNTER — Encounter (HOSPITAL_COMMUNITY): Payer: Self-pay | Admitting: Certified Registered"

## 2014-12-20 ENCOUNTER — Inpatient Hospital Stay (HOSPITAL_COMMUNITY): Payer: Medicare Other | Admitting: Vascular Surgery

## 2014-12-20 ENCOUNTER — Inpatient Hospital Stay (HOSPITAL_COMMUNITY)
Admission: RE | Admit: 2014-12-20 | Discharge: 2014-12-22 | DRG: 470 | Disposition: A | Discharge status: Home / Self Care | Payer: Medicare Other | Source: Ambulatory Visit | Attending: Orthopedic Surgery | Admitting: Orthopedic Surgery

## 2014-12-20 DIAGNOSIS — Z85828 Personal history of other malignant neoplasm of skin: Secondary | ICD-10-CM

## 2014-12-20 DIAGNOSIS — E785 Hyperlipidemia, unspecified: Secondary | ICD-10-CM | POA: Diagnosis present

## 2014-12-20 DIAGNOSIS — D62 Acute posthemorrhagic anemia: Secondary | ICD-10-CM | POA: Diagnosis not present

## 2014-12-20 DIAGNOSIS — H43813 Vitreous degeneration, bilateral: Secondary | ICD-10-CM | POA: Diagnosis present

## 2014-12-20 DIAGNOSIS — Z950 Presence of cardiac pacemaker: Secondary | ICD-10-CM

## 2014-12-20 DIAGNOSIS — F329 Major depressive disorder, single episode, unspecified: Secondary | ICD-10-CM | POA: Diagnosis present

## 2014-12-20 DIAGNOSIS — K219 Gastro-esophageal reflux disease without esophagitis: Secondary | ICD-10-CM | POA: Diagnosis present

## 2014-12-20 DIAGNOSIS — Z90710 Acquired absence of both cervix and uterus: Secondary | ICD-10-CM

## 2014-12-20 DIAGNOSIS — Z880 Allergy status to penicillin: Secondary | ICD-10-CM | POA: Diagnosis not present

## 2014-12-20 DIAGNOSIS — Z888 Allergy status to other drugs, medicaments and biological substances status: Secondary | ICD-10-CM

## 2014-12-20 DIAGNOSIS — F419 Anxiety disorder, unspecified: Secondary | ICD-10-CM | POA: Diagnosis present

## 2014-12-20 DIAGNOSIS — G4733 Obstructive sleep apnea (adult) (pediatric): Secondary | ICD-10-CM | POA: Diagnosis present

## 2014-12-20 DIAGNOSIS — Z87440 Personal history of urinary (tract) infections: Secondary | ICD-10-CM

## 2014-12-20 DIAGNOSIS — Z881 Allergy status to other antibiotic agents status: Secondary | ICD-10-CM

## 2014-12-20 DIAGNOSIS — M25561 Pain in right knee: Secondary | ICD-10-CM | POA: Diagnosis present

## 2014-12-20 DIAGNOSIS — I119 Hypertensive heart disease without heart failure: Secondary | ICD-10-CM | POA: Diagnosis present

## 2014-12-20 DIAGNOSIS — M1711 Unilateral primary osteoarthritis, right knee: Secondary | ICD-10-CM | POA: Diagnosis present

## 2014-12-20 DIAGNOSIS — Z86010 Personal history of colonic polyps: Secondary | ICD-10-CM | POA: Diagnosis not present

## 2014-12-20 DIAGNOSIS — Z886 Allergy status to analgesic agent status: Secondary | ICD-10-CM

## 2014-12-20 DIAGNOSIS — M8580 Other specified disorders of bone density and structure, unspecified site: Secondary | ICD-10-CM | POA: Diagnosis present

## 2014-12-20 DIAGNOSIS — G43909 Migraine, unspecified, not intractable, without status migrainosus: Secondary | ICD-10-CM | POA: Diagnosis present

## 2014-12-20 DIAGNOSIS — Z885 Allergy status to narcotic agent status: Secondary | ICD-10-CM

## 2014-12-20 DIAGNOSIS — Z96659 Presence of unspecified artificial knee joint: Secondary | ICD-10-CM

## 2014-12-20 HISTORY — PX: TOTAL KNEE ARTHROPLASTY: SHX125

## 2014-12-20 LAB — CREATININE, SERUM
Creatinine, Ser: 0.67 mg/dL (ref 0.50–1.10)
GFR calc Af Amer: >90 mL/min (ref >90)
GFR, EST NON AFRICAN AMERICAN: 89 mL/min — AB (ref >90)

## 2014-12-20 LAB — CBC
HEMATOCRIT: 36.6 % (ref 36.0–46.0)
HEMOGLOBIN: 12.5 g/dL (ref 12.0–15.0)
MCH: 32 pg (ref 26.0–34.0)
MCHC: 34.2 g/dL (ref 30.0–36.0)
MCV: 93.6 fL (ref 78.0–100.0)
Platelets: 172 10*3/uL (ref 150–400)
RBC: 3.91 MIL/uL (ref 3.87–5.11)
RDW: 12.5 % (ref 11.5–15.5)
WBC: 7 10*3/uL (ref 4.0–10.5)

## 2014-12-20 SURGERY — ARTHROPLASTY, KNEE, TOTAL
Anesthesia: Spinal | Site: Knee | Laterality: Right

## 2014-12-20 MED ORDER — SODIUM CHLORIDE 0.9 % IJ SOLN
INTRAMUSCULAR | Status: AC
  Filled 2014-12-20: qty 10

## 2014-12-20 MED ORDER — ZOLPIDEM TARTRATE 5 MG PO TABS: 5.0000 mg | ORAL_TABLET | Freq: Every evening | ORAL | Status: DC | PRN

## 2014-12-20 MED ORDER — ROCURONIUM BROMIDE 50 MG/5ML IV SOLN
INTRAVENOUS | Status: AC
  Filled 2014-12-20: qty 1

## 2014-12-20 MED ORDER — MENTHOL 3 MG MT LOZG: 1.0000 | LOZENGE | OROMUCOSAL | Status: DC | PRN

## 2014-12-20 MED ORDER — LORAZEPAM 0.5 MG PO TABS
0.5000 mg | ORAL_TABLET | Freq: Three times a day (TID) | ORAL | Status: DC | PRN
  Administered 2014-12-20 – 2014-12-22 (×3): 0.5 mg via ORAL
  Filled 2014-12-20 (×3): qty 1

## 2014-12-20 MED ORDER — ONDANSETRON HCL 4 MG PO TABS: 4.0000 mg | ORAL_TABLET | Freq: Four times a day (QID) | ORAL | Status: DC | PRN

## 2014-12-20 MED ORDER — METHOCARBAMOL 500 MG PO TABS
500.0000 mg | ORAL_TABLET | Freq: Four times a day (QID) | ORAL | Status: DC | PRN
  Filled 2014-12-20: qty 1

## 2014-12-20 MED ORDER — VANCOMYCIN HCL IN DEXTROSE 1-5 GM/200ML-% IV SOLN
1000.0000 mg | Freq: Two times a day (BID) | INTRAVENOUS | Status: AC
  Administered 2014-12-20: 1000 mg via INTRAVENOUS
  Filled 2014-12-20: qty 200

## 2014-12-20 MED ORDER — BUPIVACAINE-EPINEPHRINE 0.5% -1:200000 IJ SOLN
INTRAMUSCULAR | Status: DC | PRN
Start: 2014-12-20 — End: 2014-12-20
  Administered 2014-12-20: 20 mL

## 2014-12-20 MED ORDER — FAMOTIDINE 20 MG PO TABS
20.0000 mg | ORAL_TABLET | Freq: Every day | ORAL | Status: DC
  Administered 2014-12-21 – 2014-12-22 (×2): 20 mg via ORAL
  Filled 2014-12-20 (×3): qty 1

## 2014-12-20 MED ORDER — METHOCARBAMOL 500 MG PO TABS
ORAL_TABLET | ORAL | Status: AC
  Filled 2014-12-20: qty 1

## 2014-12-20 MED ORDER — ACETAMINOPHEN 325 MG PO TABS: 650.0000 mg | ORAL_TABLET | Freq: Four times a day (QID) | ORAL | Status: DC | PRN

## 2014-12-20 MED ORDER — PROPOFOL INFUSION 10 MG/ML OPTIME
INTRAVENOUS | Status: DC | PRN
  Administered 2014-12-20: 100 ug/kg/min via INTRAVENOUS

## 2014-12-20 MED ORDER — METOCLOPRAMIDE HCL 5 MG/ML IJ SOLN
5.0000 mg | Freq: Three times a day (TID) | INTRAMUSCULAR | Status: DC | PRN
  Administered 2014-12-20: 10 mg via INTRAVENOUS
  Filled 2014-12-20: qty 2

## 2014-12-20 MED ORDER — FENTANYL CITRATE 0.05 MG/ML IJ SOLN
25.0000 ug | INTRAMUSCULAR | Status: DC | PRN
  Administered 2014-12-20: 50 ug via INTRAVENOUS
  Administered 2014-12-20: 25 ug via INTRAVENOUS

## 2014-12-20 MED ORDER — OXYCODONE HCL ER 10 MG PO T12A
10.0000 mg | EXTENDED_RELEASE_TABLET | Freq: Two times a day (BID) | ORAL | Status: DC
  Administered 2014-12-20: 10 mg via ORAL
  Filled 2014-12-20 (×2): qty 1

## 2014-12-20 MED ORDER — DIPHENHYDRAMINE HCL 12.5 MG/5ML PO ELIX: 12.5000 mg | ORAL_SOLUTION | ORAL | Status: DC | PRN

## 2014-12-20 MED ORDER — OXYCODONE HCL 5 MG PO TABS
5.0000 mg | ORAL_TABLET | ORAL | Status: DC | PRN
  Administered 2014-12-20: 10 mg via ORAL
  Filled 2014-12-20 (×2): qty 2

## 2014-12-20 MED ORDER — MIDAZOLAM HCL 5 MG/5ML IJ SOLN
INTRAMUSCULAR | Status: DC | PRN
  Administered 2014-12-20: 2 mg via INTRAVENOUS

## 2014-12-20 MED ORDER — SUCCINYLCHOLINE CHLORIDE 20 MG/ML IJ SOLN
INTRAMUSCULAR | Status: AC
  Filled 2014-12-20: qty 1

## 2014-12-20 MED ORDER — LORAZEPAM 0.5 MG PO TABS: 0.5000 mg | ORAL_TABLET | ORAL | Status: DC

## 2014-12-20 MED ORDER — DEXTROSE 5 % IV SOLN
500.0000 mg | INTRAVENOUS | Status: AC
  Administered 2014-12-20: 500 mg via INTRAVENOUS
  Filled 2014-12-20: qty 5

## 2014-12-20 MED ORDER — DILTIAZEM HCL ER COATED BEADS 120 MG PO CP24
120.0000 mg | ORAL_CAPSULE | Freq: Every day | ORAL | Status: DC
  Administered 2014-12-21 – 2014-12-22 (×2): 120 mg via ORAL
  Filled 2014-12-20 (×3): qty 1

## 2014-12-20 MED ORDER — FENTANYL CITRATE 0.05 MG/ML IJ SOLN
INTRAMUSCULAR | Status: AC
  Filled 2014-12-20: qty 2

## 2014-12-20 MED ORDER — ALUM & MAG HYDROXIDE-SIMETH 200-200-20 MG/5ML PO SUSP
30.0000 mL | ORAL | Status: DC | PRN
  Administered 2014-12-20: 30 mL via ORAL
  Filled 2014-12-20: qty 30

## 2014-12-20 MED ORDER — ONDANSETRON HCL 4 MG/2ML IJ SOLN
4.0000 mg | Freq: Four times a day (QID) | INTRAMUSCULAR | Status: DC | PRN
  Administered 2014-12-20 – 2014-12-21 (×2): 4 mg via INTRAVENOUS
  Filled 2014-12-20 (×2): qty 2

## 2014-12-20 MED ORDER — BUPIVACAINE LIPOSOME 1.3 % IJ SUSP
20.0000 mL | INTRAMUSCULAR | Status: AC
  Administered 2014-12-20: 20 mL
  Filled 2014-12-20: qty 20

## 2014-12-20 MED ORDER — LORAZEPAM 1 MG PO TABS
1.0000 mg | ORAL_TABLET | Freq: Every day | ORAL | Status: DC
  Administered 2014-12-21 (×2): 1 mg via ORAL
  Filled 2014-12-20 (×2): qty 1

## 2014-12-20 MED ORDER — PHENOL 1.4 % MT LIQD: 1.0000 | OROMUCOSAL | Status: DC | PRN

## 2014-12-20 MED ORDER — FLEET ENEMA 7-19 GM/118ML RE ENEM: 1.0000 | ENEMA | Freq: Once | RECTAL | Status: AC | PRN

## 2014-12-20 MED ORDER — BISACODYL 5 MG PO TBEC: 5.0000 mg | DELAYED_RELEASE_TABLET | Freq: Every day | ORAL | Status: DC | PRN

## 2014-12-20 MED ORDER — PROPOFOL 10 MG/ML IV BOLUS
INTRAVENOUS | Status: AC
  Filled 2014-12-20: qty 20

## 2014-12-20 MED ORDER — SENNOSIDES-DOCUSATE SODIUM 8.6-50 MG PO TABS: 1.0000 | ORAL_TABLET | Freq: Every evening | ORAL | Status: DC | PRN

## 2014-12-20 MED ORDER — METHOCARBAMOL 1000 MG/10ML IJ SOLN
500.0000 mg | Freq: Four times a day (QID) | INTRAVENOUS | Status: DC | PRN
  Filled 2014-12-20: qty 5

## 2014-12-20 MED ORDER — SODIUM CHLORIDE 0.9 % IR SOLN
Status: DC | PRN
  Administered 2014-12-20: 1000 mL

## 2014-12-20 MED ORDER — SODIUM CHLORIDE 0.9 % IV SOLN
INTRAVENOUS | Status: DC
  Administered 2014-12-20 – 2014-12-21 (×2): via INTRAVENOUS

## 2014-12-20 MED ORDER — METOCLOPRAMIDE HCL 10 MG PO TABS: 5.0000 mg | ORAL_TABLET | Freq: Three times a day (TID) | ORAL | Status: DC | PRN

## 2014-12-20 MED ORDER — ENOXAPARIN SODIUM 30 MG/0.3ML ~~LOC~~ SOLN
30.0000 mg | Freq: Two times a day (BID) | SUBCUTANEOUS | Status: DC
  Administered 2014-12-21 – 2014-12-22 (×3): 30 mg via SUBCUTANEOUS
  Filled 2014-12-20 (×5): qty 0.3

## 2014-12-20 MED ORDER — SODIUM CHLORIDE 0.9 % IV SOLN
INTRAVENOUS | Status: DC
Start: 2014-12-20 — End: 2014-12-20

## 2014-12-20 MED ORDER — LIDOCAINE HCL (CARDIAC) 20 MG/ML IV SOLN
INTRAVENOUS | Status: AC
  Filled 2014-12-20: qty 5

## 2014-12-20 MED ORDER — LACTATED RINGERS IV SOLN
INTRAVENOUS | Status: DC
  Administered 2014-12-20 (×2): via INTRAVENOUS

## 2014-12-20 MED ORDER — ACETAMINOPHEN 650 MG RE SUPP: 650.0000 mg | Freq: Four times a day (QID) | RECTAL | Status: DC | PRN

## 2014-12-20 MED ORDER — EPHEDRINE SULFATE 50 MG/ML IJ SOLN
INTRAMUSCULAR | Status: AC
  Filled 2014-12-20: qty 1

## 2014-12-20 MED ORDER — HYDROMORPHONE HCL 1 MG/ML IJ SOLN
1.0000 mg | INTRAMUSCULAR | Status: DC | PRN
  Administered 2014-12-20 – 2014-12-21 (×6): 1 mg via INTRAVENOUS
  Filled 2014-12-20 (×6): qty 1

## 2014-12-20 MED ORDER — FENTANYL CITRATE 0.05 MG/ML IJ SOLN
INTRAMUSCULAR | Status: DC | PRN
  Administered 2014-12-20 (×2): 25 ug via INTRAVENOUS
  Administered 2014-12-20: 50 ug via INTRAVENOUS

## 2014-12-20 MED ORDER — FENTANYL CITRATE 0.05 MG/ML IJ SOLN
INTRAMUSCULAR | Status: AC
  Filled 2014-12-20: qty 5

## 2014-12-20 MED ORDER — BUPIVACAINE-EPINEPHRINE (PF) 0.5% -1:200000 IJ SOLN
INTRAMUSCULAR | Status: AC
  Filled 2014-12-20: qty 30

## 2014-12-20 MED ORDER — PHENYLEPHRINE 40 MCG/ML (10ML) SYRINGE FOR IV PUSH (FOR BLOOD PRESSURE SUPPORT)
PREFILLED_SYRINGE | INTRAVENOUS | Status: AC
  Filled 2014-12-20: qty 10

## 2014-12-20 MED ORDER — SODIUM CHLORIDE 0.9 % IV SOLN
2000.0000 mg | INTRAVENOUS | Status: AC
  Administered 2014-12-20: 2000 mg via TOPICAL
  Filled 2014-12-20: qty 20

## 2014-12-20 MED ORDER — DOCUSATE SODIUM 100 MG PO CAPS
100.0000 mg | ORAL_CAPSULE | Freq: Two times a day (BID) | ORAL | Status: DC
  Administered 2014-12-20 – 2014-12-22 (×4): 100 mg via ORAL
  Filled 2014-12-20 (×5): qty 1

## 2014-12-20 MED ORDER — MIDAZOLAM HCL 2 MG/2ML IJ SOLN
INTRAMUSCULAR | Status: AC
  Filled 2014-12-20: qty 2

## 2014-12-20 MED ORDER — BUPIVACAINE LIPOSOME 1.3 % IJ SUSP
20.0000 mL | INTRAMUSCULAR | Status: DC
  Filled 2014-12-20: qty 20

## 2014-12-20 MED ORDER — PROMETHAZINE HCL 25 MG/ML IJ SOLN: 6.2500 mg | INTRAMUSCULAR | Status: DC | PRN

## 2014-12-20 MED ORDER — ACETAMINOPHEN 500 MG PO TABS
1000.0000 mg | ORAL_TABLET | Freq: Four times a day (QID) | ORAL | Status: AC
  Administered 2014-12-20 – 2014-12-21 (×3): 1000 mg via ORAL
  Filled 2014-12-20 (×2): qty 2

## 2014-12-20 SURGICAL SUPPLY — 61 items
BANDAGE ESMARK 6X9 LF (GAUZE/BANDAGES/DRESSINGS) ×1 IMPLANT
BLADE SAGITTAL 13X1.27X60 (BLADE) ×2 IMPLANT
BLADE SAGITTAL 13X1.27X60MM (BLADE) ×1
BLADE SAW SGTL 83.5X18.5 (BLADE) ×3 IMPLANT
BLADE SURG 10 STRL SS (BLADE) ×3 IMPLANT
BNDG CMPR 9X6 STRL LF SNTH (GAUZE/BANDAGES/DRESSINGS) ×1
BNDG ESMARK 6X9 LF (GAUZE/BANDAGES/DRESSINGS) ×3
BOWL SMART MIX CTS (DISPOSABLE) ×3 IMPLANT
CAPT KNEE TOTAL 3 ×3 IMPLANT
CEMENT BONE SIMPLEX SPEEDSET (Cement) ×6 IMPLANT
COVER SURGICAL LIGHT HANDLE (MISCELLANEOUS) ×3 IMPLANT
CUFF TOURNIQUET SINGLE 34IN LL (TOURNIQUET CUFF) ×3 IMPLANT
DRAPE EXTREMITY T 121X128X90 (DRAPE) ×3 IMPLANT
DRAPE IMP U-DRAPE 54X76 (DRAPES) ×3 IMPLANT
DRAPE INCISE IOBAN 66X45 STRL (DRAPES) ×6 IMPLANT
DRAPE PROXIMA HALF (DRAPES) IMPLANT
DRAPE U-SHAPE 47X51 STRL (DRAPES) ×3 IMPLANT
DRSG ADAPTIC 3X8 NADH LF (GAUZE/BANDAGES/DRESSINGS) ×3 IMPLANT
DRSG PAD ABDOMINAL 8X10 ST (GAUZE/BANDAGES/DRESSINGS) ×3 IMPLANT
DURAPREP 26ML APPLICATOR (WOUND CARE) ×6 IMPLANT
ELECT REM PT RETURN 9FT ADLT (ELECTROSURGICAL) ×3
ELECTRODE REM PT RTRN 9FT ADLT (ELECTROSURGICAL) ×1 IMPLANT
EVACUATOR 1/8 PVC DRAIN (DRAIN) ×3 IMPLANT
GAUZE SPONGE 4X4 12PLY STRL (GAUZE/BANDAGES/DRESSINGS) ×3 IMPLANT
GLOVE BIOGEL M 7.0 STRL (GLOVE) ×2 IMPLANT
GLOVE BIOGEL PI IND STRL 7.5 (GLOVE) IMPLANT
GLOVE BIOGEL PI IND STRL 8.5 (GLOVE) ×2 IMPLANT
GLOVE BIOGEL PI INDICATOR 7.5 (GLOVE) ×4
GLOVE BIOGEL PI INDICATOR 8.5 (GLOVE) ×2
GLOVE SURG ORTHO 8.0 STRL STRW (GLOVE) ×4 IMPLANT
GOWN STRL REUS W/ TWL LRG LVL3 (GOWN DISPOSABLE) ×1 IMPLANT
GOWN STRL REUS W/ TWL XL LVL3 (GOWN DISPOSABLE) ×2 IMPLANT
GOWN STRL REUS W/TWL LRG LVL3 (GOWN DISPOSABLE) ×3
GOWN STRL REUS W/TWL XL LVL3 (GOWN DISPOSABLE) ×6
HANDPIECE INTERPULSE COAX TIP (DISPOSABLE) ×3
HOOD PEEL AWAY FACE SHEILD DIS (HOOD) ×9 IMPLANT
KIT BASIN OR (CUSTOM PROCEDURE TRAY) ×3 IMPLANT
KIT ROOM TURNOVER OR (KITS) ×3 IMPLANT
KNEE CAPITATED TOTAL 3 IMPLANT
MANIFOLD NEPTUNE II (INSTRUMENTS) ×3 IMPLANT
NEEDLE 22X1 1/2 (OR ONLY) (NEEDLE) ×6 IMPLANT
NS IRRIG 1000ML POUR BTL (IV SOLUTION) ×3 IMPLANT
PACK TOTAL JOINT (CUSTOM PROCEDURE TRAY) ×3 IMPLANT
PACK UNIVERSAL I (CUSTOM PROCEDURE TRAY) ×3 IMPLANT
PAD ARMBOARD 7.5X6 YLW CONV (MISCELLANEOUS) ×6 IMPLANT
PADDING CAST COTTON 6X4 STRL (CAST SUPPLIES) ×3 IMPLANT
SET HNDPC FAN SPRY TIP SCT (DISPOSABLE) ×1 IMPLANT
SPONGE GAUZE 4X4 12PLY STER LF (GAUZE/BANDAGES/DRESSINGS) ×2 IMPLANT
STAPLER VISISTAT 35W (STAPLE) ×3 IMPLANT
SUCTION FRAZIER TIP 10 FR DISP (SUCTIONS) ×3 IMPLANT
SUT BONE WAX W31G (SUTURE) ×3 IMPLANT
SUT VIC AB 0 CTB1 27 (SUTURE) ×6 IMPLANT
SUT VIC AB 1 CT1 27 (SUTURE) ×6
SUT VIC AB 1 CT1 27XBRD ANBCTR (SUTURE) ×2 IMPLANT
SUT VIC AB 2-0 CT1 27 (SUTURE) ×6
SUT VIC AB 2-0 CT1 TAPERPNT 27 (SUTURE) ×2 IMPLANT
SYR 20CC LL (SYRINGE) ×6 IMPLANT
TOWEL OR 17X24 6PK STRL BLUE (TOWEL DISPOSABLE) ×3 IMPLANT
TOWEL OR 17X26 10 PK STRL BLUE (TOWEL DISPOSABLE) ×3 IMPLANT
TRAY CATH 16FR W/PLASTIC CATH (SET/KITS/TRAYS/PACK) ×2 IMPLANT
WATER STERILE IRR 1000ML POUR (IV SOLUTION) ×6 IMPLANT

## 2014-12-20 NOTE — Anesthesia Procedure Notes (Signed)
Spinal Patient location during procedure: OR Start time: 12/20/2014 8:44 AM End time: 12/20/2014 8:54 AM Staffing Anesthesiologist: Alexis Frock Performed by: anesthesiologist  Preanesthetic Checklist Completed: patient identified, site marked, surgical consent, pre-op evaluation, timeout performed, IV checked, risks and benefits discussed and monitors and equipment checked Spinal Block Patient position: sitting Location: L2-3 Injection technique: single-shot Needle Needle gauge: 24 G Needle length: 10 cm Assessment Sensory level: T12 Additional Notes Marcaine.75% with dextrose 1.37ml

## 2014-12-20 NOTE — H&P (Signed)
[name] MRN:  [id number] DOB/SEX:  [birth year]/female  CHIEF COMPLAINT:  Painful right Knee  HISTORY: Patient is a 69 y.o. female presented with a history of pain in the right knee. Onset of symptoms was gradual starting several years ago with gradually worsening course since that time. Prior procedures on the knee include arthroscopy. Patient has been treated conservatively with over-the-counter NSAIDs and activity modification. Patient currently rates pain in the knee at 10 out of 10 with activity. There is pain at night.  PAST MEDICAL HISTORY: Patient Active Problem List   Diagnosis Date Noted  . Cough 11/21/2014  . URI, acute 11/07/2014  . Medicare annual wellness visit, initial 10/12/2014  . Advance care planning 10/12/2014  . Chest pain 05/19/2014  . Rapid palpitations 09/23/2013  . Fatigue 08/07/2013  . Alopecia 05/11/2013  . Benign hypertensive heart disease without heart failure 01/07/2013  . Pacemaker-Medtronic 10/08/2012  . Hiatal hernia 05/28/2012  . Esophageal stricture 05/28/2012  . Dysuria 04/03/2012  . Osteoporosis screening 01/16/2012  . Breast mass 01/16/2012  . Anxiety 01/16/2012  . Sick sinus syndrome   . Osteoarthritis of knee 04/19/2011  . Internal hemorrhoids 02/28/2011  . History of colon polyps 02/28/2011  . Constipation 02/28/2011  . GERD 06/28/2009  . CHEST PAIN 06/28/2009  . COLONIC POLYPS, ADENOMATOUS, HX OF 06/24/2009  . Hypercholesterolemia 03/30/2008  . Obstructive sleep apnea 03/30/2008  . History of SCC (squamous cell carcinoma) of skin 03/30/2008   Past Medical History  Diagnosis Date  . Colon polyp     adenomatous  . Hemorrhoids   . Inguinal hernia   . Hyperlipidemia   . Sleep apnea     can't use c-pap  . Allergic rhinitis   . GERD (gastroesophageal reflux disease)   . Bradycardia     s/p MDT PPM  . Atypical chest pain   . Hypertension   . Pacemaker   . Skin cancer     Basal and squamous cell  . Anemia   . Arthritis    . Blood in stool   . History of chicken pox   . Depression   . Cardiac arrhythmia due to congenital heart disease   . Thyroid disease   . UTI (urinary tract infection)   . Osteopenia 12/2012    T score -1.7 FRAX 8.5%/1.1%  . Eye problem     Vitrious detachment  . PONV (postoperative nausea and vomiting)   . Family history of adverse reaction to anesthesia     MALIGNANT HYPERTHERMIA IN PATIENT'S UNCLE AND NEPHEW  . 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 Cough     RECENT COLD   PT TX WITH ANTIBIOTIC  . Headache     MIGRAINES  . Dizziness   . History of hiatal hernia   . Frequent urination at night   . Scleritis of right eye   . Vitreous detachment of both eyes   . Heart murmur     reports h/o MVP   Past Surgical History  Procedure Laterality Date  . Thyroidectomy  1979    RIGHT SIDE  . Polypectomy    . Pacemaker insertion  2006    Dr. Reyes Ivan  . Abdominal hysterectomy    . Knee arthroscopy  2011  . Cesarean section    . Dilation and curettage of uterus    . Thumb surgery    . Oophorectomy  1995    LSO and RSO  . Tear  duct surgery  2010  . Hand surgery  1992  . Inguinal hernia repair  02/12/1994    left inguinal hernia repair  . Hernia repair    . Breast surgery      Biopsy-benign     MEDICATIONS:   No prescriptions prior to admission    ALLERGIES:   Allergies  Allergen Reactions  . Antihistamines, Loratadine-Type Other (See Comments)    Heart races/jittery  . Bisoprolol-Hydrochlorothiazide Other (See Comments)    Couldn't tolerate  . Bystolic [Nebivolol Hcl] Other (See Comments)    Pt does not tolerate this med  . Cephalexin Nausea And Vomiting  . Codeine Other (See Comments)    Increased heart rate, anxiety  . Demerol [Meperidine] Other (See Comments)    Increased heart rate, anxiety  . Epinephrine Other (See Comments)    Increased heart rate  . Erythromycin Nausea And Vomiting  . Hyoscyamine Sulfate Other (See  Comments)    Pt could not tolerate this med  . Inderal [Propranolol] Other (See Comments)    Extreme tiredness  . Lidocaine Other (See Comments)    Family hx of malignant hyperthermia  . Lipitor [Atorvastatin] Other (See Comments)    Muscle aches  . Losartan Potassium Other (See Comments)    Pt could not tolerate this med  . Morphine Other (See Comments)    Nightmares, increased BP  . Paxil [Paroxetine Hcl] Other (See Comments)    "drove her up the wall"  . Penicillins Nausea And Vomiting  . Prednisone Other (See Comments)    Tolerates low doses if absolutely necessary - makes her nervous, jittery  . Propranolol Hcl Other (See Comments)    Pt could not tolerate this med  . Sertraline Hcl Other (See Comments)    Pt could not tolerate this med  . Tetracycline Nausea And Vomiting  . Toprol Xl [Metoprolol Succinate] Other (See Comments)    Extreme tiredness  . Verapamil Other (See Comments)    Pt could not tolerate this med    REVIEW OF SYSTEMS:  A comprehensive review of systems was negative.   FAMILY HISTORY:   Family History  Problem Relation Age of Onset  . Colon cancer Neg Hx   . Heart failure Sister   . Heart disease Sister   . Hypertension Sister   . Dementia Mother   . Hypertension Mother   . Hypertension Father   . Arthritis Father   . Breast cancer Maternal Aunt     Age 54's  . Diabetes Maternal Aunt   . Diabetes Maternal Uncle   . Ovarian cancer Maternal Aunt     SOCIAL HISTORY:   History  Substance Use Topics  . Smoking status: Never Smoker   . Smokeless tobacco: Never Used  . Alcohol Use: No     EXAMINATION:  Vital signs in last 24 hours:    General appearance: alert, cooperative and no distress Lungs: clear to auscultation bilaterally Heart: regular rate and rhythm, S1, S2 normal, no murmur, click, rub or gallop Abdomen: soft, non-tender; bowel sounds normal; no masses,  no organomegaly Extremities: extremities normal, atraumatic, no  cyanosis or edema and Homans sign is negative, no sign of DVT Pulses: 2+ and symmetric Skin: Skin color, texture, turgor normal. No rashes or lesions Neurologic: Alert and oriented X 3, normal strength and tone. Normal symmetric reflexes. Normal coordination and gait  Musculoskeletal:  ROM 0-115, Ligaments intact,  Imaging Review Plain radiographs demonstrate severe degenerative joint disease of the right knee.  The overall alignment is significant varus. The bone quality appears to be good for age and reported activity level.  Assessment/Plan: Primary osteoarthritis, right knee   The patient history, physical examination and imaging studies are consistent with advanced degenerative joint disease of the right knee. The patient has failed conservative treatment.  The clearance notes were reviewed.  After discussion with the patient it was felt that Total Knee Replacement was indicated. The procedure,  risks, and benefits of total knee arthroplasty were presented and reviewed. The risks including but not limited to aseptic loosening, infection, blood clots, vascular injury, stiffness, patella tracking problems complications among others were discussed. The patient acknowledged the explanation, agreed to proceed with the plan.  Angello Chien 12/20/2014, 6:36 AM

## 2014-12-20 NOTE — Progress Notes (Signed)
Physical Therapy Evaluation Patient Details Name: [name] MRN: [id number] DOB: 10/13/1946 Today's Date: 12/20/2014   History of Present Illness  Patient is a 69 yo female admitted 12/20/14 now s/p Rt TKA.  PMH:  chest pain, HTN, SSS, pacemaker, anxiety, depression, OA, HLD, OSA, migraines  Clinical Impression  Patient presents with problems listed below.  Will benefit from acute PT to maximize independence prior to discharge home with husband.  Session limited today due to pain and nausea.    Follow Up Recommendations Home health PT;Supervision/Assistance - 24 hour    Equipment Recommendations  None recommended by PT    Recommendations for Other Services       Precautions / Restrictions Precautions Precautions: Knee;Fall Precaution Booklet Issued: Yes (comment) Precaution Comments: Reviewed precautions with patient and husband. Restrictions Weight Bearing Restrictions: Yes RLE Weight Bearing: Weight bearing as tolerated      Mobility  Bed Mobility Overal bed mobility: Needs Assistance Bed Mobility: Supine to Sit;Sit to Supine     Supine to sit: Mod assist Sit to supine: Min assist   General bed mobility comments: Verbal cues for technique.  Assist to move RLE off of bed, and to raise trunk to sitting.  Once upright, patient c/o dizziness and nausea.  Assist to bring LE's onto bed to return to supine.  Transfers Overall transfer level: Needs assistance Equipment used: Rolling walker (2 wheeled) Transfers: Sit to/from [facility] Sit to Stand: Min assist Stand pivot transfers: Min assist       General transfer comment: Verbal cues for hand placement and technique.  Assist to rise to standing and for balance.  Patient able to take several steps to pivot bed <> BSC.  Assist for balance and safety.  Patient remained dizzy and nauseated - vomited once back on bed.  RN notified.  Ambulation/Gait                Stairs             Wheelchair Mobility    Modified Rankin (Stroke Patients Only)       Balance                                             Pertinent Vitals/Pain Pain Assessment: 0-10 Pain Score: 8  Pain Location: Rt knee and thigh Pain Descriptors / Indicators: Aching Pain Intervention(s): Premedicated before session;Repositioned;Limited activity within patient's tolerance    Home Living Family/patient expects to be discharged to:: Private residence Living Arrangements: Spouse/significant other Available Help at Discharge: Family;Available 24 hours/day Type of Home: House Home Access: Stairs to enter Entrance Stairs-Rails: Psychiatric nurse of Steps: 4 Home Layout: One level Home Equipment: Walker - 2 wheels;Bedside commode;Wheelchair - manual      Prior Function Level of Independence: Independent               Hand Dominance        Extremity/Trunk Assessment   Upper Extremity Assessment: Overall WFL for tasks assessed           Lower Extremity Assessment: RLE deficits/detail RLE Deficits / Details: Decreased ROM and strength post-op    Cervical / Trunk Assessment: Normal  Communication   Communication: No difficulties  Cognition Arousal/Alertness: Lethargic;Suspect due to medications Behavior During Therapy: Anxious Overall Cognitive Status: Within Functional Limits for tasks assessed  General Comments      Exercises Total Joint Exercises Ankle Circles/Pumps: AROM;Both;10 reps;Supine      Assessment/Plan    PT Assessment Patient needs continued PT services  PT Diagnosis Difficulty walking;Generalized weakness;Acute pain   PT Problem List Decreased strength;Decreased range of motion;Decreased activity tolerance;Decreased balance;Decreased mobility;Decreased knowledge of use of DME;Decreased knowledge of precautions;Pain  PT Treatment Interventions DME instruction;Gait training;Stair  training;Functional mobility training;Therapeutic activities;Therapeutic exercise;Patient/family education   PT Goals (Current goals can be found in the Care Plan section) Acute Rehab PT Goals Patient Stated Goal: To decrease pain PT Goal Formulation: With patient/family Time For Goal Achievement: 12/27/14 Potential to Achieve Goals: Good    Frequency 7X/week   Barriers to discharge        Co-evaluation               End of Session Equipment Utilized During Treatment: Gait belt;Oxygen Activity Tolerance: Patient limited by pain;Patient limited by lethargy (Limited by nausea) Patient left: in bed;with call bell/phone within reach;with family/visitor present Nurse Communication: Mobility status (Patient requests nausea medicine)         Time: 3143-8887 PT Time Calculation (min) (ACUTE ONLY): 33 min   Charges:   PT Evaluation $Initial PT Evaluation Tier I: 1 Procedure PT Treatments $Therapeutic Activity: 23-37 mins   PT G CodesDespina Pole 01-18-15, 6:46 PM Carita Pian. Sanjuana Kava, [place] Pager [phone]

## 2014-12-20 NOTE — Progress Notes (Signed)
Orthopedic Tech Progress Note Patient Details:  [name] 08/01/1946 [id number] Viewed order from doctor's order list CPM Right Knee CPM Right Knee: On Right Knee Flexion (Degrees): 90 Right Knee Extension (Degrees): 0 Additional Comments: trapeze bar patient helper   Hildred Priest 12/20/2014, 11:13 AM

## 2014-12-20 NOTE — Progress Notes (Signed)
Utilization review completed.  

## 2014-12-20 NOTE — Progress Notes (Signed)
PT Cancellation Note  Patient Details Name: [name] MRN: [id number] DOB: January 15, 1946   Cancelled Treatment:    Reason Eval/Treat Not Completed: Pain limiting ability to participate. RN deferred due to 10/10 pain. PT to re-attempt as able.    Kingsley Callander 12/20/2014, 2:53 PM   Kittie Plater, PT, DPT Pager #: [phone] Office #: [phone]

## 2014-12-20 NOTE — Op Note (Signed)
TOTAL KNEE REPLACEMENT OPERATIVE NOTE:  12/20/2014  8:13 PM  PATIENT:  [name]  69 y.o. female  PRE-OPERATIVE DIAGNOSIS:  primary osteoarthritis right knee  POST-OPERATIVE DIAGNOSIS:  primary osteoarthritis right knee  PROCEDURE:  Procedure(s): RIGHT TOTAL KNEE ARTHROPLASTY  SURGEON:  Surgeon(s): Vickey Huger, MD  PHYSICIAN ASSISTANT: Carlynn Spry, [facility]  ANESTHESIA:   regional  DRAINS: Hemovac  SPECIMEN: None  COUNTS:  Correct  TOURNIQUET:   Total Tourniquet Time Documented: Thigh (Right) - -[id number] minutes Total: Thigh (Right) - -[id number] minutes   DICTATION:  Indication for procedure:    The patient is a 69 y.o. female who has failed conservative treatment for primary osteoarthritis right knee.  Informed consent was obtained prior to anesthesia. The risks versus benefits of the operation were explain and in a way the patient can, and did, understand.   On the implant demand matching protocol, this patient scored 10.  Therefore, this patient did" "did not receive a polyethylene insert with vitamin E which is a high demand implant.  Description of procedure:     The patient was taken to the operating room and placed under anesthesia.  The patient was positioned in the usual fashion taking care that all body parts were adequately padded and/or protected.  I foley catheter was not placed.  A tourniquet was applied and the leg prepped and draped in the usual sterile fashion.  The extremity was exsanguinated with the esmarch and tourniquet inflated to 350 mmHg.  Pre-operative range of motion was normal.  The knee was in 3 degree of mild varus.  A midline incision approximately 6-7 inches long was made with a #10 blade.  A new blade was used to make a parapatellar arthrotomy going 2-3 cm into the quadriceps tendon, over the patella, and alongside the medial aspect of the patellar tendon.  A synovectomy was then performed with the #10 blade and forceps. I then elevated the  deep MCL off the medial tibial metaphysis subperiosteally around to the semimembranosus attachment.    I everted the patella and used calipers to measure patellar thickness.  I used the reamer to ream down to appropriate thickness to recreate the native thickness.  I then removed excess bone with the rongeur and sagittal saw.  I used the appropriately sized template and drilled the three lug holes.  I then put the trial in place and measured the thickness with the calipers to ensure recreation of the native thickness.  The trial was then removed and the patella subluxed and the knee brought into flexion.  A homan retractor was place to retract and protect the patella and lateral structures.  A Z-retractor was place medially to protect the medial structures.  The extra-medullary alignment system was used to make cut the tibial articular surface perpendicular to the anamotic axis of the tibia and in 3 degrees of posterior slope.  The cut surface and alignment jig was removed.  I then used the intramedullary alignment guide to make a 6 valgus cut on the distal femur.  I then marked out the epicondylar axis on the distal femur.  The posterior condylar axis measured 3 degrees.  I then used the anterior referencing sizer and measured the femur to be a size 6.  The 4-In-1 cutting block was screwed into place in external rotation matching the posterior condylar angle, making our cuts perpendicular to the epicondylar axis.  Anterior, posterior and chamfer cuts were made with the sagittal saw.  The cutting block and cut  pieces were removed.  A lamina spreader was placed in 90 degrees of flexion.  The ACL, PCL, menisci, and posterior condylar osteophytes were removed.  A 14 mm spacer blocked was found to offer good flexion and extension gap balance after minimal in degree releasing.   The scoop retractor was then placed and the femoral finishing block was pinned in place.  The small sagittal saw was used as well as the  lug drill to finish the femur.  The block and cut surfaces were removed and the medullary canal hole filled with autograft bone from the cut pieces.  The tibia was delivered forward in deep flexion and external rotation.  A size E tray was selected and pinned into place centered on the medial 1/3 of the tibial tubercle.  The reamer and keel was used to prepare the tibia through the tray.    I then trialed with the size 6 femur, size E tibia, a 14 mm insert and the 32 patella.  I had excellent flexion/extension gap balance, excellent patella tracking.  Flexion was full and beyond 120 degrees; extension was zero.  These components were chosen and the staff opened them to me on the back table while the knee was lavaged copiously and the cement mixed.  The soft tissue was infiltrated with 60cc of exparel 1.3% through a 21 gauge needle.  I cemented in the components and removed all excess cement.  The polyethylene tibial component was snapped into place and the knee placed in extension while cement was hardening.  The capsule was infilltrated with 30cc of .25% Marcaine with epinephrine.  A hemovac was place in the joint exiting superolaterally.  A pain pump was place superomedially superficial to the arthrotomy.  Once the cement was hard, the tourniquet was let down.  Hemostasis was obtained.  The arthrotomy was closed with figure-8 #1 vicryl sutures.  The deep soft tissues were closed with #0 vicryls and the subcuticular layer closed with a running #2-0 vicryl.  The skin was reapproximated and closed with skin staples.  The wound was dressed with xeroform, 4 x4's, 2 ABD sponges, a single layer of webril and a TED stocking.   The patient was then awakened, extubated, and taken to the recovery room in stable condition.  BLOOD LOSS:  300cc DRAINS: 1 hemovac, 1 pain catheter COMPLICATIONS:  None.  PLAN OF CARE: Admit to inpatient   PATIENT DISPOSITION:  PACU - hemodynamically stable.   Delay start of  Pharmacological VTE agent (>24hrs) due to surgical blood loss or risk of bleeding:  not applicable  Please fax a copy of this op note to my office at [phone] (please only include page 1 and 2 of the Case Information op note)

## 2014-12-20 NOTE — Transfer of Care (Signed)
Immediate Anesthesia Transfer of Care Note  Patient: [name]  Procedure(s) Performed: Procedure(s): RIGHT TOTAL KNEE ARTHROPLASTY (Right)  Patient Location: PACU  Anesthesia Type:Spinal and MAC combined with regional for post-op pain  Level of Consciousness: awake, alert , oriented and patient cooperative  Airway & Oxygen Therapy: Patient Spontanous Breathing and Patient connected to nasal cannula oxygen  Post-op Assessment: Report given to PACU RN, Post -op Vital signs reviewed and stable and Patient moving all extremities  Post vital signs: Reviewed and stable  Complications: No apparent anesthesia complications

## 2014-12-20 NOTE — Anesthesia Postprocedure Evaluation (Signed)
  Anesthesia Post-op Note  Patient: [name]  Procedure(s) Performed: Procedure(s): RIGHT TOTAL KNEE ARTHROPLASTY (Right)  Patient Location: PACU  Anesthesia Type:Spinal  Level of Consciousness: awake, alert  and oriented  Airway and Oxygen Therapy: Patient Spontanous Breathing  Post-op Pain: moderate  Post-op Assessment: Post-op Vital signs reviewed, Patient's Cardiovascular Status Stable, Respiratory Function Stable, Patent Airway and No signs of Nausea or vomiting  Post-op Vital Signs: Reviewed and stable  Last Vitals:  Filed Vitals:   12/20/14 1053  BP:   Pulse: 59  Temp:   Resp: 14    Complications: No apparent anesthesia complications

## 2014-12-21 ENCOUNTER — Encounter (HOSPITAL_COMMUNITY): Payer: Self-pay | Admitting: Orthopedic Surgery

## 2014-12-21 LAB — CBC
HEMATOCRIT: 34.6 % — AB (ref 36.0–46.0)
Hemoglobin: 11.9 g/dL — ABNORMAL LOW (ref 12.0–15.0)
MCH: 31.6 pg (ref 26.0–34.0)
MCHC: 34.4 g/dL (ref 30.0–36.0)
MCV: 92 fL (ref 78.0–100.0)
Platelets: 181 10*3/uL (ref 150–400)
RBC: 3.76 MIL/uL — AB (ref 3.87–5.11)
RDW: 12.1 % (ref 11.5–15.5)
WBC: 8 10*3/uL (ref 4.0–10.5)

## 2014-12-21 LAB — BASIC METABOLIC PANEL
Anion gap: 8 (ref 5–15)
BUN: 5 mg/dL — ABNORMAL LOW (ref 6–23)
CO2: 28 mmol/L (ref 19–32)
CREATININE: 0.67 mg/dL (ref 0.50–1.10)
Calcium: 8.1 mg/dL — ABNORMAL LOW (ref 8.4–10.5)
Chloride: 93 mEq/L — ABNORMAL LOW (ref 96–112)
GFR calc Af Amer: >90 mL/min (ref >90)
GFR calc non Af Amer: 89 mL/min — ABNORMAL LOW (ref >90)
GLUCOSE: 124 mg/dL — AB (ref 70–99)
Potassium: 3.6 mmol/L (ref 3.5–5.1)
Sodium: 129 mmol/L — ABNORMAL LOW (ref 135–145)

## 2014-12-21 MED ORDER — IBUPROFEN 600 MG PO TABS
600.0000 mg | ORAL_TABLET | Freq: Three times a day (TID) | ORAL | Status: DC
  Administered 2014-12-21 – 2014-12-22 (×5): 600 mg via ORAL
  Filled 2014-12-21 (×6): qty 1

## 2014-12-21 MED ORDER — TRAMADOL HCL 50 MG PO TABS
50.0000 mg | ORAL_TABLET | ORAL | Status: DC | PRN
  Administered 2014-12-21 (×2): 100 mg via ORAL
  Filled 2014-12-21 (×2): qty 2

## 2014-12-21 NOTE — Progress Notes (Signed)
SPORTS MEDICINE AND JOINT REPLACEMENT  Lara Mulch, MD   Carlynn Spry, PA-C [address], [place], [place]  [id number]                             [phone]   PROGRESS NOTE  Subjective:  negative for Chest Pain  negative for Shortness of Breath  negative for Nausea/Vomiting   negative for Calf Pain  negative for Bowel Movement   Tolerating Diet: yes         Patient reports pain as 7 on 0-10 scale.    Objective: Vital signs in last 24 hours:   Patient Vitals for the past 24 hrs:  BP Temp Temp src Pulse Resp SpO2  12/21/14 1600 - - - - 18 100 %  12/21/14 1200 - - - - 18 100 %  12/21/14 0800 - - - - 15 100 %  12/21/14 0613 140/70 mmHg 97.8 F (36.6 C) Oral 65 - 100 %  12/21/14 0138 (!) 148/73 mmHg 98.4 F (36.9 C) Oral 77 - 98 %  12/20/14 2023 (!) 148/73 mmHg 98.4 F (36.9 C) Oral 62 - 100 %    @flow {1959:LAST@   Intake/Output from previous day:   01/18 0701 - 01/19 0700 In: 2205 [P.O.:240; I.V.:1910] Out: -    Intake/Output this shift:       Intake/Output      01/18 0701 - 01/19 0700 01/19 0701 - 01/20 0700   P.O. 240    I.V. 1910    IV Piggyback 55    Total Intake 2205     Net +2205          Urine Occurrence 3 x       LABORATORY DATA:  Recent Labs  12/20/14 1310 12/21/14 0515  WBC 7.0 8.0  HGB 12.5 11.9*  HCT 36.6 34.6*  PLT 172 181    Recent Labs  12/20/14 1310 12/21/14 0515  NA  --  129*  K  --  3.6  CL  --  93*  CO2  --  28  BUN  --  5*  CREATININE 0.67 0.67  GLUCOSE  --  124*  CALCIUM  --  8.1*   Lab Results  Component Value Date   INR 1.05 12/07/2014    Examination:  General appearance: alert, cooperative and no distress Extremities: Homans sign is negative, no sign of DVT  Wound Exam: clean, dry, intact   Drainage:  Scant/small amount Serosanguinous exudate  Motor Exam: EHL and FHL Intact  Sensory Exam: Deep Peroneal normal   Assessment:    1 Day Post-Op  Procedure(s) (LRB): RIGHT TOTAL KNEE  ARTHROPLASTY (Right)  ADDITIONAL DIAGNOSIS:  Active Problems:   S/P total knee arthroplasty  Acute Blood Loss Anemia   Plan: Physical Therapy as ordered Weight Bearing as Tolerated (WBAT)  DVT Prophylaxis:  Lovenox  DISCHARGE PLAN: Home  DISCHARGE NEEDS: HHPT, CPM, Walker and 3-in-1 comode seat         Reina Wilton 12/21/2014, 4:41 PM

## 2014-12-21 NOTE — Progress Notes (Signed)
Occupational Therapy Evaluation Patient Details Name: [name] MRN: [id number] DOB: [birth year] Today's Date: 12/21/2014    History of Present Illness Patient is a 69 yo female admitted 12/20/14 now s/p Rt TKA.  PMH:  chest pain, HTN, SSS, pacemaker, anxiety, depression, OA, HLD, OSA, migraines   Clinical Impression   Pt improving with mobility this pm but continues to complain of feeling "lightheaded". Completed all education for ADL and functional mobility for ADL with pt/husband. Pt able to return demonstrate. Feel pt will most likely be able to D/C tomorrow after PT. OT signing off. No further OT needs.     Follow Up Recommendations  No OT follow up;Supervision - Intermittent    Equipment Recommendations  None recommended by OT    Recommendations for Other Services       Precautions / Restrictions Precautions Precautions: Knee;Fall Precaution Booklet Issued: Yes (comment) Precaution Comments: Reviewed precautions with patient and husband. Restrictions RLE Weight Bearing: Weight bearing as tolerated      Mobility Bed Mobility Overal bed mobility: Needs Assistance Bed Mobility: Supine to Sit      Transfers Overall transfer level: Needs assistance Equipment used: Rolling walker (2 wheeled) Transfers: Sit to/from [employer] Sit to Stand: Min guard Stand pivot transfers: Min guard          Balance Overall balance assessment: Needs assistance Sitting-balance support: Feet supported;No upper extremity supported Sitting balance-Leahy Scale: Good     Standing balance support: Single extremity supported Standing balance-Leahy Scale: Fair                              ADL Overall ADL's : Needs assistance/impaired             Lower Body Bathing: Minimal assistance;Sit to/from stand       Lower Body Dressing: Minimal assistance;Sit to/from stand   Toilet Transfer: Minimal assistance;Ambulation   Toileting- Clothing  Manipulation and Hygiene: Supervision/safety;Sit to/from Nurse, children's Details (indicate cue type and reason): minA  - simulated shower transfer with 3 in 1 ` Functional mobility during ADLs: Minimal assistance (due to not feeling well) General ADL Comments: completed education withpt/husband regarding set up and compensatory techniques for ADL and mobility. Educated on home safety and reducing risk of falls.      Vision                     Perception     Praxis      Pertinent Vitals/Pain Pain Assessment: 0-10 Pain Score: 4  Pain Location: R knee Pain Descriptors / Indicators: Aching Pain Intervention(s): Limited activity within patient's tolerance;Monitored during session;Repositioned     Hand Dominance     Extremity/Trunk Assessment Upper Extremity Assessment Upper Extremity Assessment: Overall WFL for tasks assessed   Lower Extremity Assessment Lower Extremity Assessment: Defer to PT evaluation   Cervical / Trunk Assessment Cervical / Trunk Assessment: Normal   Communication Communication Communication: No difficulties   Cognition Arousal/Alertness: Awake/alert Behavior During Therapy: WFL for tasks assessed/performed Overall Cognitive Status: Within Functional Limits for tasks assessed                     General Comments       Exercises Exercises: Total Joint     Shoulder Instructions      Home Living Family/patient expects to be discharged to:: Private residence Living Arrangements: Spouse/significant other Available Help at  Discharge: Family;Available 24 hours/day Type of Home: House Home Access: Stairs to enter [employer] of Steps: 4 Entrance Stairs-Rails: Right;Left Home Layout: One level     Bathroom Shower/Tub: Producer, television/film/video: Standard Bathroom Accessibility: Yes How Accessible: Accessible via walker Home Equipment: Walker - 2 wheels;Bedside commode;Wheelchair - manual           Prior Functioning/Environment Level of Independence: Independent             OT Diagnosis: Generalized weakness;Acute pain   OT Problem List: Decreased strength;Decreased range of motion;Decreased activity tolerance;Pain;Decreased knowledge of use of DME or AE   OT Treatment/Interventions:      OT Goals(Current goals can be found in the care plan section) Acute Rehab OT Goals Patient Stated Goal: to go home OT Goal Formulation: All assessment and education complete, DC therapy  OT Frequency:     Barriers to D/C:            Co-evaluation              End of Session Equipment Utilized During Treatment: Gait belt;Rolling walker CPM Right Knee CPM Right Knee: Off Right Knee Flexion (Degrees): 55 Right Knee Extension (Degrees): 0 Nurse Communication: Mobility status  Activity Tolerance: Patient tolerated treatment well Patient left: in chair;with call bell/phone within reach   Time: 1630-1704 OT Time Calculation (min): 34 min Charges:  OT General Charges $OT Visit: 1 Procedure OT Evaluation $Initial OT Evaluation Tier I: 1 Procedure OT Treatments $Self Care/Home Management : 8-22 mins G-Codes:    Mihaela Fajardo,HILLARY 12-25-14, 5:31 PM   [facility], OTR/L  [phone] Dec 25, 2014

## 2014-12-21 NOTE — Progress Notes (Signed)
Physical Therapy Treatment Patient Details Name: [name] MRN: [id number] DOB: May 10, 1946 Today's Date: 12/21/2014    History of Present Illness Patient is a 69 yo female admitted 12/20/14 now s/p Rt TKA.  PMH:  chest pain, HTN, SSS, pacemaker, anxiety, depression, OA, HLD, OSA, migraines    PT Comments    Pt is POD #1 and this is her second session today.  She is progressing slowly, but better than AM session.  She was able to walk into the hallway with min assist and RW. She continues to be limited by nausea and dizziness, however, significantly improved from AM session.  PT will continue to follow acutely.  She will need to do seated exercises tomorrow and likely initiate stair training in the PM.    Follow Up Recommendations  Home health PT;Supervision/Assistance - 24 hour     Equipment Recommendations  None recommended by PT    Recommendations for Other Services   NA     Precautions / Restrictions Precautions Precautions: Knee;Fall Precaution Booklet Issued: Yes (comment) Precaution Comments: Reviewed precautions with patient and husband. Restrictions RLE Weight Bearing: Weight bearing as tolerated    Mobility  Bed Mobility Overal bed mobility: Needs Assistance Bed Mobility: Sit to Supine       Sit to supine: Min assist   General bed mobility comments: Min assist to help pt lift her right leg into the bed.   Transfers Overall transfer level: Needs assistance Equipment used: Rolling walker (2 wheeled) Transfers: Sit to/from Stand Sit to Stand: Min assist         General transfer comment: Min assist to support trunk during transitions.  Verbal cues for upright posture.   Ambulation/Gait Ambulation/Gait assistance: Min assist Ambulation Distance (Feet): 45 Feet Assistive device: Rolling walker (2 wheeled) Gait Pattern/deviations: Step-to pattern;Antalgic     General Gait Details: Verbal cues for correct LE sequencing during gait. Min assist to support  trunk for balance and to help unweight her right leg when stepping on the left.        Balance Overall balance assessment: Needs assistance Sitting-balance support: Feet supported;No upper extremity supported Sitting balance-Leahy Scale: Good     Standing balance support: Bilateral upper extremity supported;No upper extremity supported;Single extremity supported Standing balance-Leahy Scale: Poor                      Cognition Arousal/Alertness: Awake/alert Behavior During Therapy: WFL for tasks assessed/performed Overall Cognitive Status: Within Functional Limits for tasks assessed                      Exercises Total Joint Exercises Ankle Circles/Pumps: AROM;Both;10 reps;Supine Quad Sets: AROM;Right;10 reps;Supine Towel Squeeze: AROM;Both;10 reps;Supine Short Arc Quad: AAROM;Right;10 reps;Supine Heel Slides: AAROM;10 reps;Supine;Right Hip ABduction/ADduction: AROM;Right;10 reps;Supine Straight Leg Raises: AAROM;Right;10 reps;Supine        Pertinent Vitals/Pain Pain Assessment: 0-10 Pain Score: 7  Pain Location: right knee Pain Descriptors / Indicators: Aching;Burning Pain Intervention(s): Limited activity within patient's tolerance;Monitored during session;Repositioned           PT Goals (current goals can now be found in the care plan section) Acute Rehab PT Goals Patient Stated Goal: To decrease pain, nausea and dizziness. Progress towards PT goals: Progressing toward goals    Frequency  7X/week    PT Plan Current plan remains appropriate       End of Session Equipment Utilized During Treatment: Gait belt Activity Tolerance: Patient limited by pain Patient left:  in bed;in CPM;with call bell/phone within reach     Time: 1350-1426 PT Time Calculation (min) (ACUTE ONLY): 36 min  Charges:  $Gait Training: 8-22 mins $Therapeutic Exercise: 8-22 mins            Latrenda Irani B. Jaylianna Tatlock, PT, DPT [phone]   12/21/2014, 4:58 PM

## 2014-12-21 NOTE — Progress Notes (Signed)
Physical Therapy Treatment Patient Details Name: [name] MRN: [id number] DOB: [birth year] Today's Date: 12/21/2014    History of Present Illness Patient is a 69 yo female admitted 12/20/14 now s/p Rt TKA.  PMH:  chest pain, HTN, SSS, pacemaker, anxiety, depression, OA, HLD, OSA, migraines    PT Comments    Pt is POD #1 and is progressing slowly. She is very limited by pain, nausea, and dizziness in sitting.  She has a history of vertigo (per pt report) so we may need to monitor her for vestibular treatment as needed.  Right now it is hard to say if it is actually vertigo vs. Reaction to strong pain medication.  For what mobility she was able to tolerate, she was only min physical assist with RW.  TK exercises initiated as well as precaution education.  PT will continue to follow acutely and progress as able.    Follow Up Recommendations  Home health PT;Supervision/Assistance - 24 hour     Equipment Recommendations  None recommended by PT    Recommendations for Other Services   NA     Precautions / Restrictions Precautions Precautions: Knee;Fall Precaution Booklet Issued: Yes (comment) Precaution Comments: Reviewed precautions with patient and husband. Restrictions RLE Weight Bearing: Weight bearing as tolerated    Mobility  Bed Mobility Overal bed mobility: Needs Assistance Bed Mobility: Supine to Sit     Supine to sit: Min assist     General bed mobility comments: Min assist of right leg to get to EOB.  Verbal cues for 1/2 bridge technique and sequencing.  Pt using bed rail to pull her trunk up to sitting.   Transfers Overall transfer level: Needs assistance Equipment used: Rolling walker (2 wheeled) Transfers: Sit to/from Stand Sit to Stand: Min assist         General transfer comment: Min assist to support trunk and stabilize RW during transitions.   Ambulation/Gait Ambulation/Gait assistance: Min assist Ambulation Distance (Feet): 8 Feet Assistive  device: Rolling walker (2 wheeled) Gait Pattern/deviations: Step-to pattern;Antalgic     General Gait Details: Pt only able to tolerate short distance, in room gait this AM due to dizziness, pain, and nausea.  Verbal cues for LE sequencing and min assist to support trunk for balance and to help unweight right leg when stepping with left leg.           Balance Overall balance assessment: Needs assistance Sitting-balance support: Feet supported;No upper extremity supported Sitting balance-Leahy Scale: Good     Standing balance support: Bilateral upper extremity supported Standing balance-Leahy Scale: Poor                      Cognition Arousal/Alertness: Lethargic;Suspect due to medications Behavior During Therapy: [facility] for tasks assessed/performed Overall Cognitive Status: Within Functional Limits for tasks assessed                      Exercises Total Joint Exercises Ankle Circles/Pumps: AROM;Both;10 reps;Supine Quad Sets: AROM;Right;10 reps;Supine Heel Slides: AAROM;Left;10 reps;Supine        Pertinent Vitals/Pain Pain Assessment: 0-10 Pain Score: 7  Pain Location: right knee Pain Descriptors / Indicators: Aching;Burning Pain Intervention(s): Limited activity within patient's tolerance;Monitored during session;Premedicated before session;Repositioned           PT Goals (current goals can now be found in the care plan section) Acute Rehab PT Goals Patient Stated Goal: To decrease pain, nausea and dizziness. Progress towards PT goals: Progressing toward  goals    Frequency  7X/week    PT Plan Current plan remains appropriate       End of Session Equipment Utilized During Treatment: Gait belt Activity Tolerance: Patient limited by fatigue;Patient limited by pain;Other (comment) (limited by dizziness and nausea) Patient left: in chair;with call bell/phone within reach;with family/visitor present     Time: 1030-1101 PT Time Calculation (min)  (ACUTE ONLY): 31 min  Charges:  $Therapeutic Exercise: 8-22 mins $Therapeutic Activity: 8-22 mins                     Aviv Lengacher B. Demonta Wombles, PT, DPT [phone]   12/21/2014, 12:29 PM

## 2014-12-21 NOTE — Progress Notes (Signed)
OT Cancellation Note  Patient Details Name: [name] MRN: [id number] DOB: 1946-09-24   Cancelled Treatment:    Reason Eval/Treat Not Completed: Other (comment) Attempted to see this am. Pt lethargic - most likely from pain meds. Will return to assess after lunch.  [place], OTR/L  [phone] 12/21/2014 12/21/2014, 11:31 AM

## 2014-12-22 LAB — CBC
HEMATOCRIT: 30.2 % — AB (ref 36.0–46.0)
Hemoglobin: 10.8 g/dL — ABNORMAL LOW (ref 12.0–15.0)
MCH: 31.8 pg (ref 26.0–34.0)
MCHC: 35.8 g/dL (ref 30.0–36.0)
MCV: 88.8 fL (ref 78.0–100.0)
PLATELETS: 164 10*3/uL (ref 150–400)
RBC: 3.4 MIL/uL — ABNORMAL LOW (ref 3.87–5.11)
RDW: 11.7 % (ref 11.5–15.5)
WBC: 6.6 10*3/uL (ref 4.0–10.5)

## 2014-12-22 MED ORDER — TIZANIDINE HCL 2 MG PO TABS: 2.0000 mg | ORAL_TABLET | Freq: Four times a day (QID) | ORAL | Status: DC | PRN

## 2014-12-22 MED ORDER — OXYCODONE HCL 5 MG PO TABS: 5.0000 mg | ORAL_TABLET | ORAL | Status: DC | PRN

## 2014-12-22 MED ORDER — IBUPROFEN 600 MG PO TABS: 600.0000 mg | ORAL_TABLET | Freq: Three times a day (TID) | ORAL | Status: DC

## 2014-12-22 MED ORDER — TRAMADOL HCL 50 MG PO TABS: 50.0000 mg | ORAL_TABLET | Freq: Four times a day (QID) | ORAL | Status: DC | PRN

## 2014-12-22 MED ORDER — ENOXAPARIN SODIUM 40 MG/0.4ML ~~LOC~~ SOLN: 40.0000 mg | SUBCUTANEOUS | Status: DC

## 2014-12-22 MED ORDER — ONDANSETRON HCL 4 MG PO TABS: 4.0000 mg | ORAL_TABLET | Freq: Four times a day (QID) | ORAL | Status: DC | PRN

## 2014-12-22 NOTE — Progress Notes (Signed)
SPORTS MEDICINE AND JOINT REPLACEMENT  Lara Mulch, MD   Carlynn Spry, PA-C [place], [place], [place]  [address]                             [phone]   PROGRESS NOTE  Subjective:  negative for Chest Pain  negative for Shortness of Breath  negative for Nausea/Vomiting   negative for Calf Pain  negative for Bowel Movement   Tolerating Diet: yes         Patient reports pain as 4 on 0-10 scale.    Objective: Vital signs in last 24 hours:   Patient Vitals for the past 24 hrs:  BP Temp Temp src Pulse Resp SpO2  12/22/14 0958 136/74 mmHg - - - - -  12/22/14 0600 (!) 156/74 mmHg 98.4 F (36.9 C) Oral 74 18 98 %  12/22/14 0400 - - - - 18 99 %  12/22/14 0000 - - - - 18 99 %  12/21/14 2100 (!) 151/83 mmHg 98.4 F (36.9 C) Oral 78 18 99 %  12/21/14 2000 - - - - 18 99 %  12/21/14 1842 (!) 168/80 mmHg 97.9 F (36.6 C) Oral 71 18 99 %  12/21/14 1600 - - - - 18 100 %    @flow {1959:LAST@   Intake/Output from previous day:   01/19 0701 - 01/20 0700 In: 720 [P.O.:720] Out: -    Intake/Output this shift:       Intake/Output      01/19 0701 - 01/20 0700 01/20 0701 - 01/21 0700   P.O. 720    I.V.     IV Piggyback     Total Intake 720     Net +720          Urine Occurrence 4 x       LABORATORY DATA:  Recent Labs  12/20/14 1310 12/21/14 0515 12/22/14 0544  WBC 7.0 8.0 6.6  HGB 12.5 11.9* 10.8*  HCT 36.6 34.6* 30.2*  PLT 172 181 164    Recent Labs  12/20/14 1310 12/21/14 0515  NA  --  129*  K  --  3.6  CL  --  93*  CO2  --  28  BUN  --  5*  CREATININE 0.67 0.67  GLUCOSE  --  124*  CALCIUM  --  8.1*   Lab Results  Component Value Date   INR 1.05 12/07/2014    Examination:  General appearance: alert, cooperative and no distress Extremities: extremities normal, atraumatic, no cyanosis or edema and Homans sign is negative, no sign of DVT  Wound Exam: clean, dry, intact   Drainage:  None: wound tissue dry  Motor Exam: EHL and FHL  Intact  Sensory Exam: Deep Peroneal normal   Assessment:    2 Days Post-Op  Procedure(s) (LRB): RIGHT TOTAL KNEE ARTHROPLASTY (Right)  ADDITIONAL DIAGNOSIS:  Active Problems:   S/P total knee arthroplasty  Acute Blood Loss Anemia   Plan: Physical Therapy as ordered Weight Bearing as Tolerated (WBAT)  DVT Prophylaxis:  Lovenox  DISCHARGE PLAN: Home  DISCHARGE NEEDS: HHPT, CPM, Walker and 3-in-1 comode seat         [name] 12/22/2014, 1:21 PM

## 2014-12-22 NOTE — Progress Notes (Signed)
12/22/14 Set up with [facility] for HHPT by MD office. Spoke with the patient and her husband. No change in d/c plan. [employer] provided CPM to home, patient already had rolling walker and 3N1. No other d/c needs identified.

## 2014-12-22 NOTE — Discharge Instructions (Signed)
Diet: As you were doing prior to hospitalization   Activity:  Increase activity slowly as tolerated                  No lifting or driving for 6 weeks  Shower:  May shower without a dressing once there is no drainage from your wound.                 Do NOT wash over the wound.                 Dressing:  You may change your dressing on Thursday                    Then change the dressing daily with sterile 4"x4"s gauze dressing                     And TED hose for knees.  Weight Bearing:  Weight bearing as tolerated as taught in physical therapy.  Use a                                walker or Crutches as instructed.  To prevent constipation: you may use a stool softener such as -               Colace ( over the counter) 100 mg by mouth twice a day                Drink plenty of fluids ( prune juice may be helpful) and high fiber foods                Miralax ( over the counter) for constipation as needed.    Precautions:  If you experience chest pain or shortness of breath - call 911 immediately               For transfer to the hospital emergency department!!               If you develop a fever greater that 101 F, purulent drainage from wound,                             increased redness or drainage from wound, or calf pain -- Call the office.  Follow- Up Appointment:  Please call for an appointment to be seen on 01/04/15                                              [facility] office:  [phone]            [address] [place], [place] [address]

## 2014-12-22 NOTE — Progress Notes (Signed)
Patient d/c to home, IV removed, prescriptions given, instructions reviewed. 

## 2014-12-22 NOTE — Progress Notes (Signed)
Physical Therapy Treatment Patient Details Name: [name] MRN: [id number] DOB: September 25, 1946 Today's Date: 12/22/2014    History of Present Illness Patient is a 69 yo female admitted 12/20/14 now s/p Rt TKA.  PMH:  chest pain, HTN, SSS, pacemaker, anxiety, depression, OA, HLD, OSA, migraines    PT Comments    Much improved activity tolerance, with good progressive amb, and stair training complete; Nice stable knee in stance  Follow Up Recommendations  Home health PT;Supervision/Assistance - 24 hour     Equipment Recommendations  None recommended by PT    Recommendations for Other Services       Precautions / Restrictions Precautions Precautions: Knee;Fall Precaution Comments: Pt educated to not allow any pillow or bolster under knee for healing with optimal range of motion.  Restrictions RLE Weight Bearing: Weight bearing as tolerated    Mobility  Bed Mobility Overal bed mobility: Needs Assistance Bed Mobility: Supine to Sit     Supine to sit: Supervision     General bed mobility comments: Cues for technique  Transfers Overall transfer level: Needs assistance Equipment used: Rolling walker (2 wheeled) Transfers: Sit to/from Stand Sit to Stand: Min guard         General transfer comment: Minsteady assist; cues for hand placement, safety, and technique  Ambulation/Gait Ambulation/Gait assistance: Min guard;Supervision Ambulation Distance (Feet): 300 Feet Assistive device: Rolling walker (2 wheeled) Gait Pattern/deviations: Step-through pattern Gait velocity: slowed   General Gait Details: Cues for gait sequence, to incr heel strike, and to activate R quad for stance stability; minguard assist progressing to Supervision   Stairs Stairs: Yes Stairs assistance: Min guard Stair Management: One rail Right;Step to pattern;Sideways Number of Stairs: 5 General stair comments: verbal and demo cues for sequence and technique  Wheelchair Mobility     Modified Rankin (Stroke Patients Only)       Balance                                    Cognition Arousal/Alertness: Awake/alert Behavior During Therapy: WFL for tasks assessed/performed Overall Cognitive Status: Within Functional Limits for tasks assessed                      Exercises Total Joint Exercises Quad Sets: AROM;Right;10 reps;Supine Heel Slides: AAROM;10 reps;Supine;Right Straight Leg Raises: AAROM;Right;10 reps;Supine    General Comments        Pertinent Vitals/Pain Pain Assessment: 0-10 Pain Score: 9  Pain Location: R knee with flexion Pain Descriptors / Indicators: Aching Pain Intervention(s): Limited activity within patient's tolerance;Monitored during session    Home Living                      Prior Function            PT Goals (current goals can now be found in the care plan section) Acute Rehab PT Goals Patient Stated Goal: to go home PT Goal Formulation: With patient/family Time For Goal Achievement: 12/27/14 Potential to Achieve Goals: Good Progress towards PT goals: Progressing toward goals    Frequency  7X/week    PT Plan Current plan remains appropriate    Co-evaluation             End of Session Equipment Utilized During Treatment: Gait belt Activity Tolerance: Patient limited by pain;Patient tolerated treatment well Patient left: in chair;with call bell/phone within reach  Time: 1100-1200 (minus 5 minutes) PT Time Calculation (min) (ACUTE ONLY): 60 min  Charges:  $Gait Training: 23-37 mins $Therapeutic Exercise: 8-22 mins $Therapeutic Activity: 8-22 mins                    G Codes:      Quin Hoop 12/22/2014, 3:37 PM  [place]oney [name], [place]  Acute Rehabilitation Services Pager [phone] Office [phone]

## 2014-12-22 NOTE — Progress Notes (Signed)
Physical Therapy Treatment Note   Continuing progress with functional mobility, with very good improvements throughout the day in activity tolerance; OK for dc home from PT standpoint     12/22/14 1600  PT Visit Information  Last PT Received On 12/22/14  Assistance Needed +1  History of Present Illness Patient is a 69 yo female admitted 12/20/14 now s/p Rt TKA.  PMH:  chest pain, HTN, SSS, pacemaker, anxiety, depression, OA, HLD, OSA, migraines  PT Time Calculation  PT Start Time (ACUTE ONLY) 1459  PT Stop Time (ACUTE ONLY) 1522  PT Time Calculation (min) (ACUTE ONLY) 23 min  Subjective Data  Patient Stated Goal to go home  Precautions  Precautions Knee;Fall  Precaution Comments Pt educated to not allow any pillow or bolster under knee for healing with optimal range of motion.   Restrictions  RLE Weight Bearing WBAT  Pain Assessment  Pain Assessment 0-10  Pain Score 7  Pain Location R knee  Pain Descriptors / Indicators Aching  Pain Intervention(s) Monitored during session  Cognition  Arousal/Alertness Awake/alert  Behavior During Therapy WFL for tasks assessed/performed  Overall Cognitive Status Within Functional Limits for tasks assessed  Transfers  Overall transfer level Needs assistance  Equipment used Rolling walker (2 wheeled)  Transfers Sit to/from Stand  Sit to Stand Supervision  General transfer comment Minsteady assist; cues for hand placement, safety, and technique; able to stand from low bench without physical assist  Ambulation/Gait  Ambulation/Gait assistance Supervision  Ambulation Distance (Feet) 200 Feet  Assistive device Rolling walker (2 wheeled)  Gait Pattern/deviations Step-through pattern  Gait velocity slowed  General Gait Details Cues for gait sequence, to incr heel strike, and to activate R quad for stance stability  Stairs assistance Min guard  General stair comments verbal and demo cues for sequence and technique  PT - End of Session   Equipment Utilized During Treatment Gait belt  Activity Tolerance Patient limited by pain;Patient tolerated treatment well  Patient left in chair;with call bell/phone within reach  Nurse Communication Mobility status  PT - Assessment/Plan  PT Plan Current plan remains appropriate  PT Frequency (ACUTE ONLY) 7X/week  Follow Up Recommendations Home health PT;Supervision/Assistance - 24 hour  PT equipment None recommended by PT  PT Goal Progression  Progress towards PT goals Progressing toward goals  Acute Rehab PT Goals  PT Goal Formulation With patient/family  Time For Goal Achievement 12/27/14  Potential to Achieve Goals Good  PT General Charges  $$ ACUTE PT VISIT 1 Procedure  PT Treatments  $Gait Training 23-37 mins   [place]oney [name], [place]  Acute Rehabilitation Services Pager [phone] Office [phone]

## 2014-12-24 ENCOUNTER — Ambulatory Visit (INDEPENDENT_AMBULATORY_CARE_PROVIDER_SITE_OTHER): Payer: 59 | Admitting: *Deleted

## 2014-12-24 DIAGNOSIS — Z950 Presence of cardiac pacemaker: Secondary | ICD-10-CM

## 2014-12-25 LAB — MDC_IDC_ENUM_SESS_TYPE_REMOTE
Battery Impedance: 5443 Ohm
Battery Remaining Longevity: 3 mo
Battery Voltage: 2.61 V
Brady Statistic AP VS Percent: 48 %
Brady Statistic AS VP Percent: 0 %
Brady Statistic AS VS Percent: 51 %
Lead Channel Pacing Threshold Amplitude: 0.75 V
Lead Channel Pacing Threshold Amplitude: 0.75 V
Lead Channel Pacing Threshold Pulse Width: 0.4 ms
Lead Channel Sensing Intrinsic Amplitude: 16 mV
Lead Channel Setting Pacing Amplitude: 2 V
Lead Channel Setting Pacing Amplitude: 2.5 V
Lead Channel Setting Sensing Sensitivity: 5.6 mV
MDC IDC MSMT LEADCHNL RA IMPEDANCE VALUE: 534 Ohm
MDC IDC MSMT LEADCHNL RA PACING THRESHOLD PULSEWIDTH: 0.4 ms
MDC IDC MSMT LEADCHNL RA SENSING INTR AMPL: 2.8 mV
MDC IDC MSMT LEADCHNL RV IMPEDANCE VALUE: 552 Ohm
MDC IDC SESS DTM: 20160122174249
MDC IDC SET LEADCHNL RV PACING PULSEWIDTH: 0.4 ms
MDC IDC STAT BRADY AP VP PERCENT: 0 %

## 2014-12-27 NOTE — Addendum Note (Signed)
Addended by: Tiajuana Amass on: 12/27/2014 03:40 PM   Modules accepted: Level of Service

## 2014-12-27 NOTE — Progress Notes (Signed)
Remote pacemaker transmission.   

## 2014-12-29 ENCOUNTER — Other Ambulatory Visit: Payer: Self-pay | Admitting: Cardiology

## 2014-12-29 NOTE — Discharge Summary (Signed)
SPORTS MEDICINE & JOINT REPLACEMENT   [name] Spurling, MD   Altamese Cabal, PA-C [address] [place], [place], [place]  [address]                             [phone]  PATIENT ID: [name]        MRN:  [id number]          DOB/AGE: 1946-06-28 / 69 y.o.    DISCHARGE SUMMARY  ADMISSION DATE:    12/20/2014 DISCHARGE DATE:  12/22/2014  ADMISSION DIAGNOSIS: primary osteoarthritis right knee    DISCHARGE DIAGNOSIS:  primary osteoarthritis right knee    ADDITIONAL DIAGNOSIS: Active Problems:   S/P total knee arthroplasty  Past Medical History  Diagnosis Date  . Colon polyp     adenomatous  . Hemorrhoids   . Inguinal hernia   . Hyperlipidemia   . Sleep apnea     can't use c-pap  . Allergic rhinitis   . GERD (gastroesophageal reflux disease)   . Bradycardia     s/p MDT PPM  . Atypical chest pain   . Hypertension   . Pacemaker   . Skin cancer     Basal and squamous cell  . Anemia   . Arthritis   . Blood in stool   . History of chicken pox   . Depression   . Cardiac arrhythmia due to congenital heart disease   . Thyroid disease   . UTI (urinary tract infection)   . Osteopenia 12/2012    T score -1.7 FRAX 8.5%/1.1%  . Eye problem     Vitrious detachment  . PONV (postoperative nausea and vomiting)   . Family history of adverse reaction to anesthesia     MALIGNANT HYPERTHERMIA IN PATIENT'S UNCLE AND NEPHEW  . 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 Cough     RECENT COLD   PT TX WITH ANTIBIOTIC  . Headache     MIGRAINES  . Dizziness   . History of hiatal hernia   . Frequent urination at night   . Scleritis of right eye   . Vitreous detachment of both eyes   . Heart murmur     reports h/o MVP    PROCEDURE: Procedure(s): RIGHT TOTAL KNEE ARTHROPLASTY on 12/20/2014  CONSULTS:     HISTORY:  See H&P in chart  HOSPITAL COURSE:  [name] is a 69 y.o. admitted on 12/20/2014 and found to have a diagnosis of primary  osteoarthritis right knee.  After appropriate laboratory studies were obtained  they were taken to the operating room on 12/20/2014 and underwent Procedure(s): RIGHT TOTAL KNEE ARTHROPLASTY.   They were given perioperative antibiotics:  Anti-infectives    Start     Dose/Rate Route Frequency Ordered Stop   12/20/14 1900  vancomycin (VANCOCIN) IVPB 1000 mg/200 mL premix     1,000 mg200 mL/hr over 60 Minutes Intravenous Every 12 hours 12/20/14 1156 12/20/14 1906   12/19/14 1120  vancomycin (VANCOCIN) IVPB 1000 mg/200 mL premix     1,000 mg200 mL/hr over 60 Minutes Intravenous On call to O.R. 12/19/14 1120 12/20/14 0850    .  Tolerated the procedure well.  Placed with a foley intraoperatively.  Given Ofirmev at induction and for 48 hours.    POD# 1: Vital signs were stable.  Patient denied Chest pain, shortness of breath, or calf pain.  Patient was started  on Lovenox 30 mg subcutaneously twice daily at 8am.  Consults to PT, OT, and care management were made.  The patient was weight bearing as tolerated.  CPM was placed on the operative leg 0-90 degrees for 6-8 hours a day.  Incentive spirometry was taught.  Dressing was changed.  Hemovac was discontinued.      POD #2, Continued  PT for ambulation and exercise program.  IV saline locked.  O2 discontinued.    The remainder of the hospital course was dedicated to ambulation and strengthening.   The patient was discharged on 2 days post op in  Good condition.  Blood products given:none  DIAGNOSTIC STUDIES: Recent vital signs: No data found.      Recent laboratory studies: No results for input(s): WBC, HGB, HCT, PLT in the last 168 hours. No results for input(s): NA, K, CL, CO2, BUN, CREATININE, GLUCOSE, CALCIUM in the last 168 hours. Lab Results  Component Value Date   INR 1.05 12/07/2014     Recent Radiographic Studies :  Dg Chest 2 View  12/07/2014   CLINICAL DATA:  Preoperative chest radiograph for right total knee replacement.  Initial encounter.  EXAM: CHEST  2 VIEW  COMPARISON:  Chest radiograph performed 05/23/2005  FINDINGS: The lungs are well-aerated and clear. There is no evidence of focal opacification, pleural effusion or pneumothorax.  The heart is normal in size; the mediastinal contour is within normal limits. A pacemaker is noted at the left chest wall, with leads ending at the right atrium and right ventricle No acute osseous abnormalities are seen.  IMPRESSION: No acute cardiopulmonary process seen.   Electronically Signed   By: Roanna Raider M.D.   On: 12/07/2014 18:44    DISCHARGE INSTRUCTIONS: Discharge Instructions    CPM    Complete by:  As directed   Continuous passive motion machine (CPM):      Use the CPM from 0 to 90 for 6-8 hours per day.      You may increase by 10 per day.  You may break it up into 2 or 3 sessions per day.      Use CPM for 2 weeks or until you are told to stop.     Call MD / Call 911    Complete by:  As directed   If you experience chest pain or shortness of breath, CALL 911 and be transported to the hospital emergency room.  If you develope a fever above 101 F, pus (white drainage) or increased drainage or redness at the wound, or calf pain, call your surgeon's office.     Change dressing    Complete by:  As directed   Change dressing on Thursday, then change the dressing daily with sterile 4 x 4 inch gauze dressing and apply TED hose.     Constipation Prevention    Complete by:  As directed   Drink plenty of fluids.  Prune juice may be helpful.  You may use a stool softener, such as Colace (over the counter) 100 mg twice a day.  Use MiraLax (over the counter) for constipation as needed.     Diet - low sodium heart healthy    Complete by:  As directed      Do not put a pillow under the knee. Place it under the heel.    Complete by:  As directed      Driving restrictions    Complete by:  As directed   No driving for 6  weeks     Increase activity slowly as tolerated     Complete by:  As directed      Lifting restrictions    Complete by:  As directed   No lifting for 6 weeks     TED hose    Complete by:  As directed   Use stockings (TED hose) for 2 weeks on both leg(s).  You may remove them at night for sleeping.           DISCHARGE MEDICATIONS:     Medication List    TAKE these medications        acetaminophen 500 MG tablet  Commonly known as:  TYLENOL  Take 1,000 mg by mouth every 6 (six) hours as needed (pain).     Biotin 1000 MCG tablet  Take 1,000 mcg by mouth daily.     clindamycin 300 MG capsule  Commonly known as:  CLEOCIN  Take 1 capsule (300 mg total) by mouth 3 (three) times daily.     diltiazem 120 MG 24 hr capsule  Commonly known as:  CARDIZEM CD  Take 1 capsule (120 mg total) by mouth daily.     enoxaparin 40 MG/0.4ML injection  Commonly known as:  LOVENOX  Inject 0.4 mLs (40 mg total) into the skin daily.     GENTEAL 0.25-0.3 % Gel  Generic drug:  Carboxymethylcell-Hypromellose  Place 1 application into both eyes at bedtime.     ibuprofen 600 MG tablet  Commonly known as:  ADVIL,MOTRIN  Take 1 tablet (600 mg total) by mouth 3 (three) times daily.     LORazepam 1 MG tablet  Commonly known as:  ATIVAN  Take 0.5-1 mg by mouth See admin instructions. Take 1 tablet (1 mg) every night at bedtime, and 0.5mg  (1/2 tab) tidprn throughout day for anxiety.     LORazepam 1 MG tablet  Commonly known as:  ATIVAN  TAKE 1 TABLET BY MOUTH 4 TIMES A DAY AS NEEDED     nitroGLYCERIN 0.4 MG SL tablet  Commonly known as:  NITROSTAT  Place 1 tablet (0.4 mg total) under the tongue every 5 (five) minutes as needed. For up to 3 doses     nizatidine 150 MG capsule  Commonly known as:  AXID  Take 1 capsule (150 mg total) by mouth 2 (two) times daily.     ondansetron 4 MG tablet  Commonly known as:  ZOFRAN  Take 1 tablet (4 mg total) by mouth every 6 (six) hours as needed for nausea.     oxyCODONE 5 MG immediate release tablet   Commonly known as:  Oxy IR/ROXICODONE  Take 1-2 tablets (5-10 mg total) by mouth every 4 (four) hours as needed for breakthrough pain.     SYSTANE OP  Place 1 drop into both eyes daily as needed (dry eyes).     tiZANidine 2 MG tablet  Commonly known as:  ZANAFLEX  Take 1 tablet (2 mg total) by mouth every 6 (six) hours as needed.     traMADol 50 MG tablet  Commonly known as:  ULTRAM  Take 1-2 tablets (50-100 mg total) by mouth every 6 (six) hours as needed for moderate pain.        FOLLOW UP VISIT:       Follow-up Information    Follow up with Raymon Mutton, MD. Call on 01/04/2015.   Specialty:  Orthopedic Surgery   Contact information:   [address] [place] [address] [phone]  Follow up with [facility].   Why:  They will contact you to schedule home physical therapy visits.   Contact information:   [address] [place] [address] [phone]       DISPOSITION: HOME  CONDITION:  Good   Katelyn Broadnax 12/29/2014, 2:23 PM

## 2014-12-30 ENCOUNTER — Encounter: Payer: Self-pay | Admitting: Cardiology

## 2014-12-30 ENCOUNTER — Other Ambulatory Visit: Payer: Self-pay

## 2014-12-30 MED ORDER — NIZATIDINE 150 MG PO CAPS: 150.0000 mg | ORAL_CAPSULE | Freq: Two times a day (BID) | ORAL | Status: DC

## 2015-01-03 ENCOUNTER — Other Ambulatory Visit: Payer: Self-pay

## 2015-01-03 DIAGNOSIS — Z1231 Encounter for screening mammogram for malignant neoplasm of breast: Secondary | ICD-10-CM

## 2015-01-05 ENCOUNTER — Other Ambulatory Visit: Payer: Self-pay | Admitting: Cardiology

## 2015-01-05 DIAGNOSIS — F419 Anxiety disorder, unspecified: Secondary | ICD-10-CM

## 2015-01-05 MED ORDER — LORAZEPAM 1 MG PO TABS: ORAL_TABLET | ORAL | Status: DC

## 2015-01-05 NOTE — Telephone Encounter (Signed)
New Msg        Pt husband calling about refills on a prescription.  Pt refused to speak with refills area because he states he requested refill 4 days ago.  Please return pt call.

## 2015-01-05 NOTE — Telephone Encounter (Signed)
Rx called in as requested (Lorazepam)

## 2015-01-06 ENCOUNTER — Encounter: Payer: Self-pay | Admitting: Internal Medicine

## 2015-01-06 ENCOUNTER — Other Ambulatory Visit: Payer: Self-pay | Admitting: Orthopedic Surgery

## 2015-01-06 DIAGNOSIS — D1621 Benign neoplasm of long bones of right lower limb: Secondary | ICD-10-CM

## 2015-01-07 ENCOUNTER — Ambulatory Visit
Admission: RE | Admit: 2015-01-07 | Discharge: 2015-01-07 | Disposition: A | Discharge status: Home / Self Care | Payer: Medicare Other | Source: Ambulatory Visit | Attending: Orthopedic Surgery | Admitting: Orthopedic Surgery

## 2015-01-07 DIAGNOSIS — D1621 Benign neoplasm of long bones of right lower limb: Secondary | ICD-10-CM

## 2015-01-07 MED ORDER — IOHEXOL 300 MG/ML  SOLN
100.0000 mL | Freq: Once | INTRAMUSCULAR | Status: AC | PRN
  Administered 2015-01-07: 100 mL via INTRAVENOUS

## 2015-01-10 ENCOUNTER — Other Ambulatory Visit: Payer: Self-pay | Admitting: Cardiology

## 2015-01-10 NOTE — Telephone Encounter (Signed)
OK to refill

## 2015-01-11 ENCOUNTER — Other Ambulatory Visit: Payer: 59

## 2015-01-13 ENCOUNTER — Encounter (HOSPITAL_COMMUNITY): Payer: Self-pay | Admitting: Adult Health

## 2015-01-13 ENCOUNTER — Emergency Department (HOSPITAL_COMMUNITY)
Admission: EM | Admit: 2015-01-13 | Discharge: 2015-01-14 | Disposition: A | Discharge status: Home / Self Care | Payer: Medicare Other | Attending: Emergency Medicine | Admitting: Emergency Medicine

## 2015-01-13 DIAGNOSIS — K219 Gastro-esophageal reflux disease without esophagitis: Secondary | ICD-10-CM | POA: Diagnosis not present

## 2015-01-13 DIAGNOSIS — Z86010 Personal history of colonic polyps: Secondary | ICD-10-CM | POA: Insufficient documentation

## 2015-01-13 DIAGNOSIS — R002 Palpitations: Secondary | ICD-10-CM

## 2015-01-13 DIAGNOSIS — H15001 Unspecified scleritis, right eye: Secondary | ICD-10-CM | POA: Diagnosis not present

## 2015-01-13 DIAGNOSIS — G4730 Sleep apnea, unspecified: Secondary | ICD-10-CM | POA: Diagnosis not present

## 2015-01-13 DIAGNOSIS — Z8659 Personal history of other mental and behavioral disorders: Secondary | ICD-10-CM | POA: Diagnosis not present

## 2015-01-13 DIAGNOSIS — Q248 Other specified congenital malformations of heart: Secondary | ICD-10-CM | POA: Insufficient documentation

## 2015-01-13 DIAGNOSIS — I10 Essential (primary) hypertension: Secondary | ICD-10-CM | POA: Insufficient documentation

## 2015-01-13 DIAGNOSIS — Z85828 Personal history of other malignant neoplasm of skin: Secondary | ICD-10-CM | POA: Diagnosis not present

## 2015-01-13 DIAGNOSIS — H43813 Vitreous degeneration, bilateral: Secondary | ICD-10-CM | POA: Insufficient documentation

## 2015-01-13 DIAGNOSIS — Z862 Personal history of diseases of the blood and blood-forming organs and certain disorders involving the immune mechanism: Secondary | ICD-10-CM | POA: Diagnosis not present

## 2015-01-13 DIAGNOSIS — Z950 Presence of cardiac pacemaker: Secondary | ICD-10-CM | POA: Insufficient documentation

## 2015-01-13 DIAGNOSIS — M1990 Unspecified osteoarthritis, unspecified site: Secondary | ICD-10-CM | POA: Insufficient documentation

## 2015-01-13 DIAGNOSIS — Z880 Allergy status to penicillin: Secondary | ICD-10-CM | POA: Insufficient documentation

## 2015-01-13 DIAGNOSIS — Z8639 Personal history of other endocrine, nutritional and metabolic disease: Secondary | ICD-10-CM | POA: Insufficient documentation

## 2015-01-13 DIAGNOSIS — Z79899 Other long term (current) drug therapy: Secondary | ICD-10-CM | POA: Diagnosis not present

## 2015-01-13 DIAGNOSIS — Z8619 Personal history of other infectious and parasitic diseases: Secondary | ICD-10-CM | POA: Insufficient documentation

## 2015-01-13 DIAGNOSIS — Z87440 Personal history of urinary (tract) infections: Secondary | ICD-10-CM | POA: Diagnosis not present

## 2015-01-13 DIAGNOSIS — R011 Cardiac murmur, unspecified: Secondary | ICD-10-CM | POA: Insufficient documentation

## 2015-01-13 LAB — BASIC METABOLIC PANEL
Anion gap: 14 (ref 5–15)
BUN: 14 mg/dL (ref 6–23)
CO2: 22 mmol/L (ref 19–32)
Calcium: 9.2 mg/dL (ref 8.4–10.5)
Chloride: 101 mmol/L (ref 96–112)
Creatinine, Ser: 0.83 mg/dL (ref 0.50–1.10)
GFR calc Af Amer: 83 mL/min — ABNORMAL LOW (ref >90)
GFR, EST NON AFRICAN AMERICAN: 71 mL/min — AB (ref >90)
GLUCOSE: 108 mg/dL — AB (ref 70–99)
POTASSIUM: 3.8 mmol/L (ref 3.5–5.1)
Sodium: 137 mmol/L (ref 135–145)

## 2015-01-13 LAB — CBC
HCT: 38.3 % (ref 36.0–46.0)
Hemoglobin: 13 g/dL (ref 12.0–15.0)
MCH: 32.7 pg (ref 26.0–34.0)
MCHC: 33.9 g/dL (ref 30.0–36.0)
MCV: 96.5 fL (ref 78.0–100.0)
PLATELETS: 246 10*3/uL (ref 150–400)
RBC: 3.97 MIL/uL (ref 3.87–5.11)
RDW: 13.2 % (ref 11.5–15.5)
WBC: 3.7 10*3/uL — AB (ref 4.0–10.5)

## 2015-01-13 LAB — MAGNESIUM: Magnesium: 2.3 mg/dL (ref 1.5–2.5)

## 2015-01-13 LAB — I-STAT TROPONIN, ED: TROPONIN I, POC: 0 ng/mL (ref 0.00–0.08)

## 2015-01-13 MED ORDER — DILTIAZEM HCL 30 MG PO TABS
30.0000 mg | ORAL_TABLET | Freq: Once | ORAL | Status: AC
  Administered 2015-01-14: 30 mg via ORAL
  Filled 2015-01-13: qty 1

## 2015-01-13 MED ORDER — SODIUM CHLORIDE 0.9 % IV BOLUS (SEPSIS)
1000.0000 mL | Freq: Once | INTRAVENOUS | Status: AC
  Administered 2015-01-13: 1000 mL via INTRAVENOUS

## 2015-01-13 NOTE — ED Provider Notes (Signed)
CSN: [id number]     Arrival date & time 01/13/15  2229 History   First MD Initiated Contact with Patient 01/13/15 2312     Chief Complaint  Patient presents with  . Palpitations     (Consider location/radiation/quality/duration/timing/severity/associated sxs/prior Treatment) HPI  [name] is a 69 y.o. female with past medical history of bradycardia status post pacemaker placement, recent knee replacement in January presented today with palpitations. Patient states this began this morning after physical therapy session. She feels like her heart is beating out of her chest or beating too hard. She denies any chest pain or shortness of breath. This has not occurred to her in the past. She denies any history of blood clots, her leg swelling after surgery has improved. She did take Lovenox for 12 days postop. Patient called the cardiologist who agreed to have the cardiology fellow see the patient emergency department. She states she is adamantly having the symptoms in the room as well. She has not felt any shocks from an AICD. She denies any recent fevers or infections. Patient has no further complaints.  10 Systems reviewed and are negative for acute change except as noted in the HPI.     Past Medical History  Diagnosis Date  . Colon polyp     adenomatous  . Hemorrhoids   . Inguinal hernia   . Hyperlipidemia   . Sleep apnea     can't use c-pap  . Allergic rhinitis   . GERD (gastroesophageal reflux disease)   . Bradycardia     s/p MDT PPM  . Atypical chest pain   . Hypertension   . Pacemaker   . Skin cancer     Basal and squamous cell  . Anemia   . Arthritis   . Blood in stool   . History of chicken pox   . Depression   . Cardiac arrhythmia due to congenital heart disease   . Thyroid disease   . UTI (urinary tract infection)   . Osteopenia 12/2012    T score -1.7 FRAX 8.5%/1.1%  . Eye problem     Vitrious detachment  . PONV (postoperative nausea and vomiting)   . Family  history of adverse reaction to anesthesia     MALIGNANT HYPERTHERMIA IN PATIENT'S UNCLE AND NEPHEW  . 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 Cough     RECENT COLD   PT TX WITH ANTIBIOTIC  . Headache     MIGRAINES  . Dizziness   . History of hiatal hernia   . Frequent urination at night   . Scleritis of right eye   . Vitreous detachment of both eyes   . Heart murmur     reports h/o MVP   Past Surgical History  Procedure Laterality Date  . Thyroidectomy  1979    RIGHT SIDE  . Polypectomy    . Pacemaker insertion  2006    Dr. Verlon Setting  . Abdominal hysterectomy    . Knee arthroscopy  2011  . Cesarean section    . Dilation and curettage of uterus    . Thumb surgery    . Oophorectomy  1995    LSO and RSO  . Tear duct surgery  2010  . Hand surgery  1992  . Inguinal hernia repair  02/12/1994    left inguinal hernia repair  . Hernia repair    . Breast surgery      Biopsy-benign  .  Total knee arthroplasty Right 12/20/2014    Procedure: RIGHT TOTAL KNEE ARTHROPLASTY;  Surgeon: Vickey Huger, MD;  Location: [place];  Service: Orthopedics;  Laterality: Right;   Family History  Problem Relation Age of Onset  . Colon cancer Neg Hx   . Heart failure Sister   . Heart disease Sister   . Hypertension Sister   . Dementia Mother   . Hypertension Mother   . Hypertension Father   . Arthritis Father   . Breast cancer Maternal Aunt     Age 71's  . Diabetes Maternal Aunt   . Diabetes Maternal Uncle   . Ovarian cancer Maternal Aunt    History  Substance Use Topics  . Smoking status: Never Smoker   . Smokeless tobacco: Never Used  . Alcohol Use: No   OB History    Gravida Para Term Preterm AB TAB SAB Ectopic Multiple Living   2 1 1  1  1   1      Review of Systems    Allergies  Antihistamines, loratadine-type; Bisoprolol-hydrochlorothiazide; Bystolic; Cephalexin; Codeine; Demerol; Epinephrine; Erythromycin; Hyoscyamine sulfate; Inderal;  Lidocaine; Lipitor; Losartan potassium; Morphine; Paxil; Penicillins; Prednisone; Propranolol hcl; Sertraline hcl; Tetracycline; Toprol xl; and Verapamil  Home Medications   Prior to Admission medications   Medication Sig Start Date End Date Taking? Authorizing Provider  acetaminophen (TYLENOL) 500 MG tablet Take 1,000 mg by mouth every 6 (six) hours as needed (pain).   Yes Historical Provider, MD  Biotin 1000 MCG tablet Take 1,000 mcg by mouth daily.   Yes Historical Provider, MD  Carboxymethylcell-Hypromellose (GENTEAL) 0.25-0.3 % GEL Place 1 application into both eyes at bedtime.   Yes Historical Provider, MD  diltiazem (CARDIZEM CD) 120 MG 24 hr capsule Take 1 capsule (120 mg total) by mouth daily. 03/02/14  Yes Darlin Coco, MD  ibuprofen (ADVIL,MOTRIN) 600 MG tablet Take 1 tablet (600 mg total) by mouth 3 (three) times daily. 12/22/14  Yes Carlynn Spry, PA-C  LORazepam (ATIVAN) 1 MG tablet TAKE 1 TABLET BY MOUTH 4 TIMES A DAY AS NEEDED 01/12/15  Yes Darlin Coco, MD  nitroGLYCERIN (NITROSTAT) 0.4 MG SL tablet Place 1 tablet (0.4 mg total) under the tongue every 5 (five) minutes as needed. For up to 3 doses Patient taking differently: Place 0.4 mg under the tongue every 5 (five) minutes as needed for chest pain. For up to 3 doses 09/03/12  Yes Darlin Coco, MD  nizatidine (AXID) 150 MG capsule TAKE (1) CAPSULE TWICE DAILY. 12/31/14  Yes Darlin Coco, MD  Polyethyl Glycol-Propyl Glycol (SYSTANE OP) Place 1 drop into both eyes daily as needed (dry eyes).   Yes Historical Provider, MD  clindamycin (CLEOCIN) 300 MG capsule Take 1 capsule (300 mg total) by mouth 3 (three) times daily. Patient not taking: Reported on 12/07/2014 11/18/14   Tonia Ghent, MD  enoxaparin (LOVENOX) 40 MG/0.4ML injection Inject 0.4 mLs (40 mg total) into the skin daily. Patient not taking: Reported on 01/13/2015 12/22/14   Carlynn Spry, PA-C  ondansetron (ZOFRAN) 4 MG tablet Take 1 tablet (4 mg total) by  mouth every 6 (six) hours as needed for nausea. Patient not taking: Reported on 01/13/2015 12/22/14   Carlynn Spry, PA-C  oxyCODONE (OXY IR/ROXICODONE) 5 MG immediate release tablet Take 1-2 tablets (5-10 mg total) by mouth every 4 (four) hours as needed for breakthrough pain. Patient not taking: Reported on 01/13/2015 12/22/14   Carlynn Spry, PA-C  tiZANidine (ZANAFLEX) 2 MG tablet Take 1 tablet (2 mg  total) by mouth every 6 (six) hours as needed. Patient not taking: Reported on 01/13/2015 12/22/14   Carlynn Spry, PA-C  traMADol (ULTRAM) 50 MG tablet Take 1-2 tablets (50-100 mg total) by mouth every 6 (six) hours as needed for moderate pain. Patient not taking: Reported on 01/13/2015 12/22/14   Carlynn Spry, PA-C   BP 159/80 mmHg  Pulse 82  Temp(Src) 97.9 F (36.6 C) (Oral)  Resp 18  Wt 122 lb (55.339 kg)  SpO2 96% Physical Exam  Constitutional: She is oriented to person, place, and time. She appears well-developed and well-nourished. No distress.  HENT:  Head: Normocephalic and atraumatic.  Nose: Nose normal.  Mouth/Throat: Oropharynx is clear and moist. No oropharyngeal exudate.  Eyes: Conjunctivae and EOM are normal. Pupils are equal, round, and reactive to light. No scleral icterus.  Neck: Normal range of motion. Neck supple. No JVD present. No tracheal deviation present. No thyromegaly present.  Cardiovascular: Normal rate, regular rhythm and normal heart sounds.  Exam reveals no gallop and no friction rub.   No murmur heard. Pulmonary/Chest: Effort normal and breath sounds normal. No respiratory distress. She has no wheezes. She exhibits no tenderness.  Abdominal: Soft. Bowel sounds are normal. She exhibits no distension and no mass. There is no tenderness. There is no rebound and no guarding.  Musculoskeletal: Normal range of motion. She exhibits edema and tenderness.  Well-healed scar on the right knee. 1+ edema of the right lower extremity, no calf tenderness negative Homans sign.   Lymphadenopathy:    She has no cervical adenopathy.  Neurological: She is alert and oriented to person, place, and time. No cranial nerve deficit. She exhibits normal muscle tone.  Skin: Skin is warm and dry. No rash noted. She is not diaphoretic. No erythema. No pallor.  Nursing note and vitals reviewed.   ED Course  Procedures (including critical care time) Labs Review Labs Reviewed  CBC - Abnormal; Notable for the following:    WBC 3.7 (*)    All other components within normal limits  BASIC METABOLIC PANEL - Abnormal; Notable for the following:    Glucose, Bld 108 (*)    GFR calc non Af Amer 71 (*)    GFR calc Af Amer 83 (*)    All other components within normal limits  MAGNESIUM  MAGNESIUM  I-STAT TROPOININ, ED    Imaging Review Dg Chest 2 View  01/14/2015   CLINICAL DATA:  Chest pain  EXAM: CHEST  2 VIEW  COMPARISON:  12/07/2014  FINDINGS: Cardiac pacemaker is unchanged in position. Mild pulmonary hyperinflation. Normal heart size and pulmonary vascularity. No focal airspace disease or consolidation in the lungs. No blunting of costophrenic angles. No pneumothorax. Mediastinal contours appear intact.  IMPRESSION: No active cardiopulmonary disease.   Electronically Signed   By: Lucienne Capers M.D.   On: 01/14/2015 01:52     EKG Interpretation   Date/Time:  Thursday January 13 2015 22:33:42 EST Ventricular Rate:  83 PR Interval:  152 QRS Duration: 76 QT Interval:  356 QTC Calculation: 418 R Axis:   -28 Text Interpretation:  Normal sinus rhythm Possible Left atrial enlargement  ST \\T \ T wave abnormality, consider inferolateral ischemia Abnormal ECG  Confirmed by Glynn Octave [id number]) on 01/13/2015 11:22:37 PM      MDM   Final diagnoses:  Palpitations    Patient since emergency department for palpitations. She is currently feeling these symptoms in the room. Her rhythm strip shows PVCs coupled with intermittent beats.  This is likely the cause of her  palpitations. Pulmonary embolism is also a possibility, as the patient has just underwent surgery approximately one month ago. However the patient's having any chest pain or shortness of breath. There is no tachycardia or hypoxia, I believe this is lower on the differential. Pacemaker was interrogated, [employer] has informed me that the battery is dying and is in a elective replacement mode. Because of this it is ignoring the atrium and only pacing the ventricle. This is likely the source of the patient's symptoms. She was advised to follow with Dr. Rayann Heman within the next 3 days for continued management and battery replacement. Her vital signs were within her normal limits and she is safe for discharge.    Everlene Balls, MD 01/14/15 [id number]

## 2015-01-13 NOTE — ED Notes (Addendum)
Presents with intermittent "feeling like my heart is pounding too hard and every once in a while it skips. It is worse when I am resting and better when I am up and moving.  I have a pacemaker for bradycardia. Dr. Jeffie Pollock and Dr. Earnest Conroy are my cardiologists" denies chest pain, denies SOB. Endorses fatigue. Recently had left knee replacement in January. Her pacemaker is [employer]

## 2015-01-13 NOTE — Consult Note (Addendum)
Referring Physician:  Emergency Department Primary Physician:  Primary Cardiologist: Dr. Mare Ferrari, EP Dr. Joylene Grapes Reason for Consultation:  Palpitations  HPI: 19yoF w/ hx of HTN, HLD, normal prior stress tests, mitral valve prolapse, palpitations, and sss s/p dc pacemaker who presents with sensation of skipped heart beats and transient heart pounding.  Ms. Koenen recently had R knee replacement and has been sleeping poorly due to pain.  This morning she has had transient heart palpitations, similar to prior episodes.  She denies chest pain, sob, dyspnea on exertion, light headedness, sustained palpitations, nausea/vomiting.  In ER she has experienced symptoms.  Review of telemetey shows wide complex QRS complexes intermittent with narrow complex QRS.  These wide complex QRS are not preceded by a sinus p wave, and likely reflect either PVCs, V-pacing, or fusion of junctional and V pacing.  Review of Systems:     Cardiac Review of Systems: {Y] = yes [ ]  = no  Chest Pain [    ]  Resting SOB [   ] Exertional SOB  [  ]  Orthopnea [  ]   Pedal Edema [   ]    Palpitations [ y ] Syncope  [  ]   Presyncope [   ]  General Review of Systems: [Y] = yes [  ]=no Constitional: recent weight change [  ]; anorexia [  ]; fatigue [  ]; nausea [  ]; night sweats [  ]; fever [  ]; or chills [  ];                                                                     Eyes : blurred vision [  ]; diplopia [   ]; vision changes [  ];  Amaurosis fugax[  ]; Resp: cough [  ];  wheezing[  ];  hemoptysis[  ];  PND [  ];  GI:  gallstones[  ], vomiting[  ];  dysphagia[  ]; melena[  ];  hematochezia [  ]; heartburn[  ];   GU: kidney stones [  ]; hematuria[  ];   dysuria [  ];  nocturia[  ]; incontinence [  ];             Skin: rash, swelling[  ];, hair loss[  ];  peripheral edema[  ];  or itching[  ]; Musculosketetal: myalgias[  ];  joint swelling[  ];  joint erythema[  ];  joint pain[  ];  back pain[  ];  Heme/Lymph:  bruising[  ];  bleeding[  ];  anemia[  ];  Neuro: TIA[  ];  headaches[  ];  stroke[  ];  vertigo[  ];  seizures[  ];   paresthesias[  ];  difficulty walking[  ];  Psych:depression[  ]; anxiety[  ];  Endocrine: diabetes[  ];  thyroid dysfunction[  ];  Other:  Past Medical History  Diagnosis Date  . Colon polyp     adenomatous  . Hemorrhoids   . Inguinal hernia   . Hyperlipidemia   . Sleep apnea     can't use c-pap  . Allergic rhinitis   . GERD (gastroesophageal reflux disease)   . Bradycardia     s/p MDT PPM  .  Atypical chest pain   . Hypertension   . Pacemaker   . Skin cancer     Basal and squamous cell  . Anemia   . Arthritis   . Blood in stool   . History of chicken pox   . Depression   . Cardiac arrhythmia due to congenital heart disease   . Thyroid disease   . UTI (urinary tract infection)   . Osteopenia 12/2012    T score -1.7 FRAX 8.5%/1.1%  . Eye problem     Vitrious detachment  . PONV (postoperative nausea and vomiting)   . Family history of adverse reaction to anesthesia     MALIGNANT HYPERTHERMIA IN PATIENT'S UNCLE AND NEPHEW  . 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 Cough     RECENT COLD   PT TX WITH ANTIBIOTIC  . Headache     MIGRAINES  . Dizziness   . History of hiatal hernia   . Frequent urination at night   . Scleritis of right eye   . Vitreous detachment of both eyes   . Heart murmur     reports h/o MVP     (Not in a hospital admission)     Infusions:   Allergies  Allergen Reactions  . Antihistamines, Loratadine-Type Other (See Comments)    Heart races/jittery  . Bisoprolol-Hydrochlorothiazide Other (See Comments)    Couldn't tolerate  . Bystolic [Nebivolol Hcl] Other (See Comments)    Pt does not tolerate this med  . Cephalexin Nausea And Vomiting  . Codeine Other (See Comments)    Increased heart rate, anxiety  . Demerol [Meperidine] Other (See Comments)    Increased heart rate, anxiety  .  Epinephrine Other (See Comments)    Increased heart rate  . Erythromycin Nausea And Vomiting  . Hyoscyamine Sulfate Other (See Comments)    Pt could not tolerate this med  . Inderal [Propranolol] Other (See Comments)    Extreme tiredness  . Lidocaine Other (See Comments)    Family hx of malignant hyperthermia  . Lipitor [Atorvastatin] Other (See Comments)    Muscle aches  . Losartan Potassium Other (See Comments)    Pt could not tolerate this med  . Morphine Other (See Comments)    Nightmares, increased BP  . Paxil [Paroxetine Hcl] Other (See Comments)    "drove her up the wall"  . Penicillins Nausea And Vomiting  . Prednisone Other (See Comments)    Tolerates low doses if absolutely necessary - makes her nervous, jittery  . Propranolol Hcl Other (See Comments)    Pt could not tolerate this med  . Sertraline Hcl Other (See Comments)    Pt could not tolerate this med  . Tetracycline Nausea And Vomiting  . Toprol Xl [Metoprolol Succinate] Other (See Comments)    Extreme tiredness  . Verapamil Other (See Comments)    Pt could not tolerate this med    History   Social History  . Marital Status: Married    Spouse Name: N/A  . Number of Children: 1  . Years of Education: N/A   Occupational History  . Retired    Social History Main Topics  . Smoking status: Never Smoker   . Smokeless tobacco: Never Used  . Alcohol Use: No  . Drug Use: No  . Sexual Activity: Not Currently    Birth Control/ Protection: Surgical, Post-menopausal     Comment: HYST   Other Topics Concern  .  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 Colon cancer Neg Hx   . Heart failure Sister   . Heart disease Sister   . Hypertension Sister   . Dementia Mother   . Hypertension Mother   . Hypertension Father   . Arthritis Father   . Breast cancer  Maternal Aunt     Age 100's  . Diabetes Maternal Aunt   . Diabetes Maternal Uncle   . Ovarian cancer Maternal Aunt     PHYSICAL EXAM: Filed Vitals:   01/13/15 2305  BP: 159/80  Pulse:   Temp:   Resp:     No intake or output data in the 24 hours ending 01/13/15 2347  General:  Well appearing. No respiratory difficulty HEENT: normal Neck: supple. no JVD. Carotids 2+ bilat; no bruits. No lymphadenopathy or thryomegaly appreciated. Cor: PMI nondisplaced. Regular rate & rhythm. No rubs, gallops or murmurs.  Very soft 1/6 sem at apex Lungs: clear Abdomen: soft, nontender, nondistended. No hepatosplenomegaly. No bruits or masses. Good bowel sounds. Extremities: no cyanosis, clubbing, rash, edema Neuro: alert & oriented x 3, cranial nerves grossly intact. moves all 4 extremities w/o difficulty. Affect pleasant.  ECG: nsr without ST changes, no PVCs  Tele: as per HPI  Results for orders placed or performed during the hospital encounter of 01/13/15 (from the past 24 hour(s))  CBC     Status: Abnormal   Collection Time: 01/13/15 10:43 PM  Result Value Ref Range   WBC 3.7 (L) 4.0 - 10.5 K/uL   RBC 3.97 3.87 - 5.11 MIL/uL   Hemoglobin 13.0 12.0 - 15.0 g/dL   HCT 38.3 36.0 - 46.0 %   MCV 96.5 78.0 - 100.0 fL   MCH 32.7 26.0 - 34.0 pg   MCHC 33.9 30.0 - 36.0 g/dL   RDW 13.2 11.5 - 15.5 %   Platelets 246 150 - 400 K/uL  Basic metabolic panel     Status: Abnormal   Collection Time: 01/13/15 10:43 PM  Result Value Ref Range   Sodium 137 135 - 145 mmol/L   Potassium 3.8 3.5 - 5.1 mmol/L   Chloride 101 96 - 112 mmol/L   CO2 22 19 - 32 mmol/L   Glucose, Bld 108 (H) 70 - 99 mg/dL   BUN 14 6 - 23 mg/dL   Creatinine, Ser 0.83 0.50 - 1.10 mg/dL   Calcium 9.2 8.4 - 10.5 mg/dL   GFR calc non Af Amer 71 (L) >90 mL/min   GFR calc Af Amer 83 (L) >90 mL/min   Anion gap 14 5 - 15  Magnesium     Status: None   Collection Time: 01/13/15 10:43 PM  Result Value Ref Range   Magnesium 2.3 1.5 -  2.5 mg/dL  I-stat troponin, ED (not at [facility])     Status: None   Collection Time: 01/13/15 10:58 PM  Result Value Ref Range   Troponin i, poc 0.00 0.00 - 0.08 ng/mL   Comment 3           No results found. Troponin POC 0.0   ASSESSMENT: 68yoF with hx of MVP presenting with mildly symptomatic PVCs, likely exacerbated by poor sleep.    Plan.   1. Check electrolytes, replete K, and MG if less than 4 or  2 2. 30 mg of diltiazem now (prior reactions to verapamil and beta blockers) 3. Interrogate pacemaker 4. Would have patient follow up with her EP: DR. Allred re: pacemaker gen change, device settings changes( increase rate settings for PVC suppression), and more thorough device interrogation (ER only allows for remote monitoring) 5. Would d/c on higher dose diltiazem  Plan d/w ER  Terressa Koyanagi, MD Cardiology Fellow  Addendum: Interrogation of device shows that pacemaker has switched to VVI to conserve battery life.  It is likely these "PVCs" are ventricularly paced beats, possibly with junctional fusion in some beats, may also have component of pacemaker syndrome with a/v dyssynchrony.  Ok to hold increase of diltiazem, would still check electrolytes and replete prn.  Pt needs to follow up with her electrophysiologist within next week.

## 2015-01-13 NOTE — ED Notes (Signed)
Cardiology at bedside.

## 2015-01-14 ENCOUNTER — Telehealth: Payer: Self-pay | Admitting: *Deleted

## 2015-01-14 ENCOUNTER — Emergency Department (HOSPITAL_COMMUNITY): Payer: Medicare Other

## 2015-01-14 NOTE — Telephone Encounter (Signed)
Patient went to the ER last night with palpitations.  [employer] transmission demonstrated pacemaker now at [facility] and has reverted to VVI 65.  Unable to reprogram this device.  Offered patient options to schedule generator change or to have office visit with Dr Rayann Heman to discuss.  She had knee replacement surgery 3 weeks ago and is still participating in rehab.  She would like to come into the office to discuss generator change.  Appt scheduled for Monday 01/17/15 at [place] 01/14/2015 8:25 AM

## 2015-01-14 NOTE — Discharge Instructions (Signed)
Pacemaker Battery Change [name], Your palpitations are likely caused by your pacemaker battery which is starting to die.  It is currently in a back up mode and you will need to follow up with Dr. Rayann Heman within 3 days for continued management.  If symptoms worsen, come back to the ED for repeat evaluation.  Thank you. A pacemaker battery usually lasts 4 to 12 years. Once or twice per year, you will be asked to visit your health care provider to have a full evaluation of your pacemaker. When a battery needs to be replaced, the entire pacemaker is replaced so that you can benefit from new circuitry and any new features that have been added to pacemakers. Most often, this procedure is very simple because the leads are already in place.  There are many things that affect how long a pacemaker battery will last, including:   The age of the pacemaker.   The number of leads (1, 2, or 3).   The pacemaker work load. If the pacemaker is helping the heart more often, the battery will not last as long as it would if the pacemaker did not need to help the heart.   Power (voltage) settings. LET [facility] CARE PROVIDER KNOW ABOUT:   Any allergies you have.   All medicines you are taking, including vitamins, herbs, eye drops, creams, and over-the-counter medicines.   Previous problems you or members of your family have had with the use of anesthetics.   Any blood disorders you have.   Previous surgeries you have had, especially since your last pacemaker placement.   Medical conditions you have.   Possibility of pregnancy, if this applies.  Symptoms of chest pain, trouble breathing, palpitations, light-headedness, or feelings of an abnormal or irregular heartbeat. RISKS AND COMPLICATIONS  Generally, this is a safe procedure. However, as with any procedure, problems can occur and include:   Bleeding.   Bruising of the skin around where the incision was made.   Pain at the incision  site.   Pulling apart of the skin at the incision site.   Infection.   Allergic reaction to anesthetics or other medicines used during the procedure.  People with diabetes may have a temporary increase in their blood sugar after any surgical procedure.  BEFORE THE PROCEDURE   Wash all of the skin around the area of the chest where the pacemaker is located.   Ask your health care provider for help with any medicine adjustments before the pacemaker is replaced.   Do not eat or drink anything after midnight on the night before the procedure or as directed by your health care provider.  Ask your health care provider if you can take a sip of water with any approved medicines the morning of the procedure. PROCEDURE   After giving medicine to numb the skin (local anesthetic), your health care provider will make a cut to reopen the pocket holding the pacemaker.   The old pacemaker will be disconnected from its leads.   The leads will be tested.   If needed, the leads will be replaced. If the leads are functioning properly, the new pacemaker may be connected to the existing leads.  A heart monitor and the pacemaker programmer will be used to make sure that the new pacemaker is working properly.  The incision site will then be closed. A dressing will be placed over the pacemaker site. The dressing will be removed 24-48 hours afterward. AFTER THE PROCEDURE   You  will be taken to a recovery area after the new pacemaker implant is completed. Your vital signs such as blood pressure, heart rate, breathing, and oxygen levels will be monitored.  Your health care provider will tell you when you will need to next test your pacemaker or when to return to the office for follow-up for removal of stitches. Document Released: 02/27/2007 Document Revised: 04/05/2014 Document Reviewed: 06/03/2013 [facility] Patient Information 2015 [place], [place]. This information is not intended to replace advice  given to you by your health care provider. Make sure you discuss any questions you have with your health care provider.

## 2015-01-17 ENCOUNTER — Encounter: Payer: Self-pay | Admitting: *Deleted

## 2015-01-17 ENCOUNTER — Encounter: Payer: Self-pay | Admitting: Internal Medicine

## 2015-01-17 ENCOUNTER — Ambulatory Visit (INDEPENDENT_AMBULATORY_CARE_PROVIDER_SITE_OTHER): Payer: Medicare Other | Admitting: Internal Medicine

## 2015-01-17 VITALS — BP 136/80 | HR 77 | Ht 64.5 in | Wt 123.1 lb

## 2015-01-17 DIAGNOSIS — I495 Sick sinus syndrome: Secondary | ICD-10-CM

## 2015-01-17 DIAGNOSIS — Z950 Presence of cardiac pacemaker: Secondary | ICD-10-CM

## 2015-01-17 DIAGNOSIS — R002 Palpitations: Secondary | ICD-10-CM

## 2015-01-17 NOTE — Progress Notes (Addendum)
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 Interrogation of her device showed ERI, which was probably the cause of her symptoms. She is here for to be scheduled for generator change out. Her general health is the same other than having right knee replacement 4 weeks ago.  Today, she denies symptoms of chest pain, shortness of breath,  lower extremity edema, dizziness, presyncope, or syncope.  No arrhythmias on device interrogation today. Still notices an intermittent pounding. The patient is otherwise without complaint today.   Past Medical History  Diagnosis Date  . Colon polyp     adenomatous  . Hemorrhoids   . Inguinal hernia   . Hyperlipidemia   . Sleep apnea     can't use c-pap  . Allergic rhinitis   . GERD (gastroesophageal reflux disease)   . Bradycardia     s/p MDT PPM  . Atypical chest pain   . Hypertension   . Pacemaker   . Skin cancer     Basal and squamous cell  . Anemia   . Arthritis   . Blood in stool   . History of chicken pox   . Depression   . Cardiac arrhythmia due to congenital heart disease   . Thyroid disease   . UTI (urinary tract infection)   . Osteopenia 12/2012    T score -1.7 FRAX 8.5%/1.1%  . Eye problem     Vitrious detachment  . PONV (postoperative nausea and vomiting)   . Family history of adverse reaction to anesthesia     MALIGNANT HYPERTHERMIA IN PATIENT'S UNCLE AND NEPHEW  . 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 Cough     RECENT COLD   PT TX WITH ANTIBIOTIC  . Headache     MIGRAINES  . Dizziness   . History of hiatal hernia   . Frequent urination at night   . Scleritis of right eye   . Vitreous detachment of both eyes   . Heart murmur     reports h/o MVP   Past Surgical History  Procedure Laterality Date  . Thyroidectomy  1979    RIGHT SIDE   . Polypectomy    . Pacemaker insertion  2006    Dr. Verlon [name]  . Abdominal hysterectomy    . Knee arthroscopy  2011  . Cesarean section    . Dilation and curettage of uterus    . Thumb surgery    . Oophorectomy  1995    LSO and RSO  . Tear duct surgery  2010  . Hand surgery  1992  . Inguinal hernia repair  02/12/1994    left inguinal hernia repair  . Hernia repair    . Breast surgery      Biopsy-benign  . Total knee arthroplasty Right 12/20/2014    Procedure: RIGHT TOTAL KNEE ARTHROPLASTY;  Surgeon: [name] Huger, MD;  Location: [place];  Service: Orthopedics;  Laterality: Right;    Current Outpatient Prescriptions  Medication Sig Dispense Refill  . acetaminophen (TYLENOL) 500 MG tablet Take 1,000 mg by mouth every 6 (six) hours as needed (pain).    . Carboxymethylcell-Hypromellose (GENTEAL) 0.25-0.3 % GEL Place 1 application into both eyes at bedtime.    Marland Kitchen diltiazem (CARDIZEM CD) 120 MG 24 hr capsule Take 1 capsule (120 mg total) by mouth daily. [place]  capsule 11  . LORazepam (ATIVAN) 1 MG tablet TAKE 1 TABLET BY MOUTH 4 TIMES A DAY AS NEEDED (Patient taking differently: TAKE 1 TABLET BY MOUTH 4 TIMES A DAY AS NEEDED FOR ANXIETY) 120 tablet 1  . nitroGLYCERIN (NITROSTAT) 0.4 MG SL tablet Place 1 tablet (0.4 mg total) under the tongue every 5 (five) minutes as needed. For up to 3 doses (Patient taking differently: Place 0.4 mg under the tongue every 5 (five) minutes as needed for chest pain. For up to 3 doses) 25 tablet 4  . nizatidine (AXID) 150 MG capsule TAKE (1) CAPSULE TWICE DAILY. 60 capsule 3  . Polyethyl Glycol-Propyl Glycol (SYSTANE OP) Place 1 drop into both eyes daily as needed (dry eyes).    . traMADol (ULTRAM) 50 MG tablet Take 1-2 tablets (50-100 mg total) by mouth every 6 (six) hours as needed for moderate pain. 60 tablet 0   No current facility-administered medications for this visit.   ROS- all systems are reviewed and negative except as per HPI above  Physical Exam: Filed  Vitals:   01/17/15 1407  BP: 136/80  Pulse: 77  Height: 5' 4.5" (1.638 m)  Weight: 123 lb 1.9 oz (55.847 kg)    GEN- The patient is well appearing, alert and oriented x 3 today.   Head- normocephalic, atraumatic Eyes-  Sclera clear, conjunctiva pink Ears- hearing intact Oropharynx- clear Lungs- Clear to ausculation bilaterally, normal work of breathing Chest- pacemaker pocket is well healed Heart- Regular rate and rhythm, no murmurs, rubs or gallops, PMI not laterally displaced GI- soft, NT, ND, + BS Extremities- no clubbing, cyanosis, or edema  Pacemaker interrogation- reviewed in detail today,  See PACEART report Epic er records/EKG reviewed.  Assessment and Plan:  1. Symptomatic palpitations with PPM (non- dependant) at [facility] Sick sinus syndrome PVCs Scheduled for replacement Friday, February 19th Benefits vrs risks explained to pt with risks being infection and/or bleeding in the pocket site. The wires will be inspected but rarely need replacement. If the wires do need replacing there is a slight risk to the heart and lungs.  2. HTN  Stable No change required today   I have seen, examined the patient, and reviewed the above assessment and plan.  Changes to above are made where necessary.  She has reached ERI battery status and therefore should proceed with PPM gen change.  Risks, benefits, and alternatives to pulse generator replacement were discussed in detail today.  The patient understands that risks include but are not limited to bleeding, infection, pneumothorax, perforation, tamponade, vascular damage, renal failure, MI, stroke, death, damage to his existing leads, and lead dislodgement and wishes to proceed.  We will therefore schedule the procedure at the next available time.   Co Sign: [name] Grayer, MD 01/17/2015 3:49 PM

## 2015-01-17 NOTE — Patient Instructions (Addendum)
Your physician recommends that you schedule a follow-up appointment in: 7-10 days from 01/21/15 in device clinic for wound check   See instruction sheet for procedure

## 2015-01-20 MED ORDER — VANCOMYCIN HCL IN DEXTROSE 1-5 GM/200ML-% IV SOLN
1000.0000 mg | INTRAVENOUS | Status: DC
  Filled 2015-01-20: qty 200

## 2015-01-20 MED ORDER — MUPIROCIN 2 % EX OINT
1.0000 "application " | TOPICAL_OINTMENT | Freq: Once | CUTANEOUS | Status: AC
  Administered 2015-01-21: 1 via TOPICAL

## 2015-01-20 MED ORDER — SODIUM CHLORIDE 0.9 % IV SOLN
INTRAVENOUS | Status: DC
  Administered 2015-01-21: 09:00:00 via INTRAVENOUS

## 2015-01-20 MED ORDER — SODIUM CHLORIDE 0.9 % IR SOLN
80.0000 mg | Status: DC
  Filled 2015-01-20: qty 2

## 2015-01-21 ENCOUNTER — Encounter (HOSPITAL_COMMUNITY): Payer: Self-pay | Admitting: *Deleted

## 2015-01-21 ENCOUNTER — Encounter (HOSPITAL_COMMUNITY)
Admission: RE | Disposition: A | Discharge status: Home / Self Care | Payer: Self-pay | Source: Ambulatory Visit | Attending: Internal Medicine

## 2015-01-21 ENCOUNTER — Ambulatory Visit (HOSPITAL_COMMUNITY)
Admission: RE | Admit: 2015-01-21 | Discharge: 2015-01-21 | Disposition: A | Discharge status: Home / Self Care | Payer: Medicare Other | Source: Ambulatory Visit | Attending: Internal Medicine | Admitting: Internal Medicine

## 2015-01-21 DIAGNOSIS — M1990 Unspecified osteoarthritis, unspecified site: Secondary | ICD-10-CM | POA: Diagnosis not present

## 2015-01-21 DIAGNOSIS — T82191A Other mechanical complication of cardiac pulse generator (battery), initial encounter: Secondary | ICD-10-CM | POA: Diagnosis not present

## 2015-01-21 DIAGNOSIS — F329 Major depressive disorder, single episode, unspecified: Secondary | ICD-10-CM | POA: Insufficient documentation

## 2015-01-21 DIAGNOSIS — M8580 Other specified disorders of bone density and structure, unspecified site: Secondary | ICD-10-CM | POA: Diagnosis not present

## 2015-01-21 DIAGNOSIS — I10 Essential (primary) hypertension: Secondary | ICD-10-CM | POA: Insufficient documentation

## 2015-01-21 DIAGNOSIS — Z79899 Other long term (current) drug therapy: Secondary | ICD-10-CM | POA: Diagnosis not present

## 2015-01-21 DIAGNOSIS — Z87440 Personal history of urinary (tract) infections: Secondary | ICD-10-CM | POA: Insufficient documentation

## 2015-01-21 DIAGNOSIS — K219 Gastro-esophageal reflux disease without esophagitis: Secondary | ICD-10-CM | POA: Insufficient documentation

## 2015-01-21 DIAGNOSIS — G4730 Sleep apnea, unspecified: Secondary | ICD-10-CM | POA: Insufficient documentation

## 2015-01-21 DIAGNOSIS — E785 Hyperlipidemia, unspecified: Secondary | ICD-10-CM | POA: Diagnosis not present

## 2015-01-21 DIAGNOSIS — I495 Sick sinus syndrome: Secondary | ICD-10-CM | POA: Diagnosis present

## 2015-01-21 DIAGNOSIS — Z85828 Personal history of other malignant neoplasm of skin: Secondary | ICD-10-CM | POA: Diagnosis not present

## 2015-01-21 DIAGNOSIS — I341 Nonrheumatic mitral (valve) prolapse: Secondary | ICD-10-CM | POA: Insufficient documentation

## 2015-01-21 DIAGNOSIS — Z45010 Encounter for checking and testing of cardiac pacemaker pulse generator [battery]: Secondary | ICD-10-CM

## 2015-01-21 DIAGNOSIS — R001 Bradycardia, unspecified: Secondary | ICD-10-CM | POA: Insufficient documentation

## 2015-01-21 DIAGNOSIS — Z950 Presence of cardiac pacemaker: Secondary | ICD-10-CM | POA: Insufficient documentation

## 2015-01-21 DIAGNOSIS — Z791 Long term (current) use of non-steroidal anti-inflammatories (NSAID): Secondary | ICD-10-CM | POA: Insufficient documentation

## 2015-01-21 DIAGNOSIS — E079 Disorder of thyroid, unspecified: Secondary | ICD-10-CM | POA: Diagnosis not present

## 2015-01-21 HISTORY — PX: PACEMAKER GENERATOR CHANGE: SHX5481

## 2015-01-21 LAB — SURGICAL PCR SCREEN
MRSA, PCR: NEGATIVE
Staphylococcus aureus: NEGATIVE

## 2015-01-21 SURGERY — PACEMAKER GENERATOR CHANGE
Anesthesia: LOCAL

## 2015-01-21 MED ORDER — CHLORHEXIDINE GLUCONATE 4 % EX LIQD: 60.0000 mL | Freq: Once | CUTANEOUS | Status: DC

## 2015-01-21 MED ORDER — BUPIVACAINE HCL (PF) 0.25 % IJ SOLN
INTRAMUSCULAR | Status: AC
  Filled 2015-01-21: qty 30

## 2015-01-21 MED ORDER — MUPIROCIN 2 % EX OINT
TOPICAL_OINTMENT | CUTANEOUS | Status: AC
  Filled 2015-01-21: qty 22

## 2015-01-21 NOTE — Op Note (Signed)
SURGEON:  [name], MD     PREPROCEDURE DIAGNOSES:   1. Sick sinus syndrome.   2. Elective Replacement Indicator Status    POSTPROCEDURE DIAGNOSES:   1. Sick sinus syndrome.   2. Elective Replacement Indicator Status    PROCEDURES:   1. Pacemaker pulse generator replacement.   2. Skin pocket revision.      INTRODUCTION:  [name] is a 69 y.o. female with a history of sick sinus syndrome. The patient has done well since his pacemaker was implanted.  The patient  has recently reached ERI battery status.  The patient presents today for pacemaker pulse generator replacement.       DESCRIPTION OF THE PROCEDURE:  Informed written consent was obtained, and the patient was brought to the electrophysiology lab in the fasting state.  The patient's pacemaker was interrogated today and found to be at elective replacement indicator battery status.  The patient required no sedation for the procedure today.  The patient's left chest was prepped and draped in the usual sterile fashion by the EP lab staff.  The skin overlying the existing pacemaker was infiltrated with bupivacaine for local analgesia.  A 4-cm incision was made over the pacemaker pocket.  Using a combination of sharp and blunt dissection, the pacemaker was exposed and removed from the body.  The device was disconnected from the leads. A single silk suture was identified and removed which had secured the device to the pectoralis fascia.  There was no foreign matter or debris within the pocket.  The atrial lead was confirmed to be a Medtronic model C338645 (serial number [id number]) lead implanted on 05/22/2005.  The right ventricular lead was confirmed to be a Medtronic model C338645 (serial number [id number]) lead implanted on the same date as the atrial lead (above).  Both leads were examined and their integrity was confirmed to be intact.  Atrial lead P-waves measured 3.5 mV with impedance of 543 ohms and a threshold of 0.5 V at 0.5 msec.   Right ventricular lead R-waves measured >30 mV with impedance of 557 ohms and a threshold of 0.6 V at 0.5 msec.  Both leads were connected to a Medtronic adapt L model ADDDR 1 (serial number [id number]) pacemaker.  The pocket was revised to accommodate this new device.  Electrocautery was required to assure hemostasis.  The pocket was irrigated with copious gentamicin solution. The pacemaker was then placed into the pocket.  The pocket was then closed in 2 layers with 2-0 Vicryl suture over the subcutaneous and subcuticular layers.  Steri-Strips and a sterile dressing were then applied.EBL<66ml. There were no early apparent complications.     CONCLUSIONS:   1. Successful pacemaker pulse generator replacement for sick sinus syndrome with symptomatic sinus bradycardia elective replacement indicator battery status   2. No early apparent complications.       Permanent Pacemaker Indication: Documented non-reversible symptomatic bradycardia due to sinus node dysfunction.   [name], MD 01/21/2015 11:34 AM

## 2015-01-21 NOTE — Discharge Instructions (Signed)
Pacemaker Implantation The heart has its own electrical system, or natural pacemaker, to regulate the heartbeat. Sometimes, the natural pacemaker system of the heart fails and causes the heart to beat too slowly. If this happens, a pacemaker can be surgically placed to help the heart beat at a normal or programmed rate. A pacemaker is a small, battery-powered device that is placed under the skin and is programmed to sense your heartbeats. If your heart rate is lower than the programmed rate, the pacemaker will pace your heart. Parts of a pacemaker include:  Wires or leads. The leads are placed in the heart and transmit electricity to the heart. The leads are connected to the pulse generator.  Pulse generator. The pulse generator contains a computer and a memory system. The pulse generator also produces the electrical signal that triggers the heart to beat. A pacemaker may be placed if:  You have a slow heartbeat (bradycardia).  You have fainting (syncope).  Shortness of breath (dyspnea) due to heart problems. LET [facility] CARE PROVIDER KNOW ABOUT:  Any allergies you may have.  All medicines you are taking, including vitamins, herbs, eye drops, creams, and over-the-counter medicines.  Previous problems you or members of your family have had with the use of anesthetics.  Any blood disorders you have.  Previous surgeries you have had.  Medical conditions you have.  Possibility of pregnancy, if this applies. RISKS AND COMPLICATIONS Generally, pacemaker implantation is a safe procedure. However, problems can occur and include:  Bleeding.  Unable to place the pacemaker under local sedation.  Infection. BEFORE THE PROCEDURE  You will have blood work drawn before the procedure.  Do not use any tobacco products including cigarettes, chewing tobacco, or electronic cigarettes. If you need help quitting, ask your health care provider.  Do not eat or drink anything after midnight on  the night before the procedure or as directed by your health care provider.  Ask your health care provider about:  Changing or stopping your regular medicines. This is especially important if you are taking diabetes medicines or blood thinners.  Taking medicines such as aspirin and ibuprofen. These medicines can thin your blood. Do not take these medicines before your procedure if your health care provider asks you not to.  Ask your health care provider if you can take a sip of water with any approved medicines the morning of the procedure. PROCEDURE  The surgery to place a pacemaker is considered a minimally invasive surgical procedure. It is done under a local anesthetic, which is an injection at the incision site that makes the skin numb. You are also given sedation and pain medicine that makes you drowsy during the procedure.   An intravenous line (IV) will be started in your hand or arm so sedation and pain medicine can be given during the pacemaker procedure.  A numbing medicine will be injected into the skin where the pacemaker is to be placed. A small incision will then be made into the skin. The pacemaker is usually placed under the skin near the collarbone.  After the incision has been made, the leads will be inserted into a large vein and guided into the heart using X-ray.  Using the same incision that was used to place the leads, a small pocket will be created under the skin to hold the pulse generator. The leads will then be connected to the pulse generator.  The incision site will then be closed. A bandage (dressing) is placed over the  pacemaker site. The dressing is removed 24-48 hours afterward. AFTER THE PROCEDURE  You will be taken to a recovery area after the pacemaker implant. Your vital signs such as blood pressure, heart rate, breathing, and oxygen levels will be monitored.  A chest X-ray will be done after the pacemaker has been implanted. This is to make sure the  pacemaker and leads are in the correct place. Document Released: 11/09/2002 Document Revised: 04/05/2014 Document Reviewed: 03/25/2012 [facility] Patient Information 2015 [place], [place]. This information is not intended to replace advice given to you by your health care provider. Make sure you discuss any questions you have with your health care provider. Pacemaker Battery Change, Care After Refer to this sheet in the next few weeks. These instructions provide you with information on caring for yourself after your procedure. Your health care provider may also give you more specific instructions. Your treatment has been planned according to current medical practices, but problems sometimes occur. Call your health care provider if you have any problems or questions after your procedure. WHAT TO EXPECT AFTER THE PROCEDURE After your procedure, it is typical to have the following sensations:  Soreness at the pacemaker site. HOME CARE INSTRUCTIONS   Keep the incision clean and dry.  Unless advised otherwise, you may shower beginning 48 hours after your procedure.  For the first week after the replacement, avoid stretching motions that pull at the incision site, and avoid heavy exercise with the arm that is on the same side as the incision.  Take medicines only as directed by your health care provider.  Keep all follow-up visits as directed by your health care provider. SEEK MEDICAL CARE IF:   You have pain at the incision site that is not relieved by over-the-counter or prescription medicine.  There is drainage or pus from the incision site.  There is swelling larger than a lime at the incision site.  You develop red streaking that extends above or below the incision site.  You feel brief, intermittent palpitations, light-headedness, or any symptoms that you feel might be related to your heart. SEEK IMMEDIATE MEDICAL CARE IF:   You experience chest pain that is different than the pain at  the pacemaker site.  You experience shortness of breath.  You have palpitations or irregular heartbeat.  You have light-headedness that does not go away quickly.  You faint.  You have pain that gets worse and is not relieved by medicine. Document Released: 09/09/2013 Document Revised: 04/05/2014 Document Reviewed: 09/09/2013 [facility] Patient Information 2015 [place], [place]. This information is not intended to replace advice given to you by your health care provider. Make sure you discuss any questions you have with your health care provider.

## 2015-01-21 NOTE — H&P (View-Only) (Signed)
PCP: [name] Stain, MD Primary Cardiologist:  [name] is a 69 y.o. female who presents today for routine electrophysiology followup after ER visit for palpitations on 01/13/15. Interrogation of her device showed ERI, which was probably the cause of her symptoms. She is here for to be scheduled for generator change out. Her general health is the same other than having right knee replacement 4 weeks ago.  Today, she denies symptoms of chest pain, shortness of breath,  lower extremity edema, dizziness, presyncope, or syncope.  No arrhythmias on device interrogation today. Still notices an intermittent pounding. The patient is otherwise without complaint today.   Past Medical History  Diagnosis Date  . Colon polyp     adenomatous  . Hemorrhoids   . Inguinal hernia   . Hyperlipidemia   . Sleep apnea     can't use c-pap  . Allergic rhinitis   . GERD (gastroesophageal reflux disease)   . Bradycardia     s/p MDT PPM  . Atypical chest pain   . Hypertension   . Pacemaker   . Skin cancer     Basal and squamous cell  . Anemia   . Arthritis   . Blood in stool   . History of chicken pox   . Depression   . Cardiac arrhythmia due to congenital heart disease   . Thyroid disease   . UTI (urinary tract infection)   . Osteopenia 12/2012    T score -1.7 FRAX 8.5%/1.1%  . Eye problem     Vitrious detachment  . PONV (postoperative nausea and vomiting)   . Family history of adverse reaction to anesthesia     MALIGNANT HYPERTHERMIA IN PATIENT'S UNCLE AND NEPHEW  . 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 Cough     RECENT COLD   PT TX WITH ANTIBIOTIC  . Headache     MIGRAINES  . Dizziness   . History of hiatal hernia   . Frequent urination at night   . Scleritis of right eye   . Vitreous detachment of both eyes   . Heart murmur     reports h/o MVP   Past Surgical History  Procedure Laterality Date  . Thyroidectomy  1979    RIGHT SIDE   . Polypectomy    . Pacemaker insertion  2006    [name]. Verlon Setting  . Abdominal hysterectomy    . Knee arthroscopy  2011  . Cesarean section    . Dilation and curettage of uterus    . Thumb surgery    . Oophorectomy  1995    LSO and RSO  . Tear duct surgery  2010  . Hand surgery  1992  . Inguinal hernia repair  02/12/1994    left inguinal hernia repair  . Hernia repair    . Breast surgery      Biopsy-benign  . Total knee arthroplasty Right 12/20/2014    Procedure: RIGHT TOTAL KNEE ARTHROPLASTY;  Surgeon: Vickey Huger, MD;  Location: [facility];  Service: Orthopedics;  Laterality: Right;    Current Outpatient Prescriptions  Medication Sig Dispense Refill  . acetaminophen (TYLENOL) 500 MG tablet Take 1,000 mg by mouth every 6 (six) hours as needed (pain).    . Carboxymethylcell-Hypromellose (GENTEAL) 0.25-0.3 % GEL Place 1 application into both eyes at bedtime.    Marland Kitchen diltiazem (CARDIZEM CD) 120 MG 24 hr capsule Take 1 capsule (120 mg total) by mouth daily. [place]  capsule 11  . LORazepam (ATIVAN) 1 MG tablet TAKE 1 TABLET BY MOUTH 4 TIMES A DAY AS NEEDED (Patient taking differently: TAKE 1 TABLET BY MOUTH 4 TIMES A DAY AS NEEDED FOR ANXIETY) 120 tablet 1  . nitroGLYCERIN (NITROSTAT) 0.4 MG SL tablet Place 1 tablet (0.4 mg total) under the tongue every 5 (five) minutes as needed. For up to 3 doses (Patient taking differently: Place 0.4 mg under the tongue every 5 (five) minutes as needed for chest pain. For up to 3 doses) 25 tablet 4  . nizatidine (AXID) 150 MG capsule TAKE (1) CAPSULE TWICE DAILY. 60 capsule 3  . Polyethyl Glycol-Propyl Glycol (SYSTANE OP) Place 1 drop into both eyes daily as needed (dry eyes).    . traMADol (ULTRAM) 50 MG tablet Take 1-2 tablets (50-100 mg total) by mouth every 6 (six) hours as needed for moderate pain. 60 tablet 0   No current facility-administered medications for this visit.   ROS- all systems are reviewed and negative except as per HPI above  Physical Exam: Filed  Vitals:   01/17/15 1407  BP: 136/80  Pulse: 77  Height: 5' 4.5" (1.638 m)  Weight: 123 lb 1.9 oz (55.847 kg)    GEN- The patient is well appearing, alert and oriented x 3 today.   Head- normocephalic, atraumatic Eyes-  Sclera clear, conjunctiva pink Ears- hearing intact Oropharynx- clear Lungs- Clear to ausculation bilaterally, normal work of breathing Chest- pacemaker pocket is well healed Heart- Regular rate and rhythm, no murmurs, rubs or gallops, PMI not laterally displaced GI- soft, NT, ND, + BS Extremities- no clubbing, cyanosis, or edema  Pacemaker interrogation- reviewed in detail today,  See PACEART report Epic er records/EKG reviewed.  Assessment and Plan:  1. Symptomatic palpitations with PPM (non- dependant) at [facility] Sick sinus syndrome PVCs Scheduled for replacement Friday, February 19th Benefits vrs risks explained to pt with risks being infection and/or bleeding in the pocket site. The wires will be inspected but rarely need replacement. If the wires do need replacing there is a slight risk to the heart and lungs.  2. HTN  Stable No change required today   I have seen, examined the patient, and reviewed the above assessment and plan.  Changes to above are made where necessary.  She has reached ERI battery status and therefore should proceed with PPM gen change.  Risks, benefits, and alternatives to ICD pulse generator replacement were discussed in detail today.  The patient understands that risks include but are not limited to bleeding, infection, pneumothorax, perforation, tamponade, vascular damage, renal failure, MI, stroke, death, damage to his existing leads, and lead dislodgement and wishes to proceed.  We will therefore schedule the procedure at the next available time.   Co Sign: [name], MD 01/17/2015 3:49 PM

## 2015-01-21 NOTE — Interval H&P Note (Signed)
History and Physical Interval Note:  01/21/2015 10:07 AM  [name]  has presented today for surgery, with the diagnosis of ppm eri  The various methods of treatment have been discussed with the patient and family. After consideration of risks, benefits and other options for treatment, the patient has consented to  Procedure(s): PACEMAKER GENERATOR CHANGE (N/A) as a surgical intervention .  The patient's history has been reviewed, patient examined, no change in status, stable for surgery.  I have reviewed the patient's chart and labs.  Questions were answered to the patient's satisfaction.     [name]

## 2015-01-21 NOTE — Interval H&P Note (Signed)
History and Physical Interval Note:  01/21/2015 10:09 AM  [name]  has presented today for surgery, with the diagnosis of ppm eri  The various methods of treatment have been discussed with the patient and family. After consideration of risks, benefits and other options for treatment, the patient has consented to  Procedure(s): PACEMAKER GENERATOR CHANGE (N/A) as a surgical intervention .  The patient's history has been reviewed, patient examined, no change in status, stable for surgery.  I have reviewed the patient's chart and labs.  Questions were answered to the patient's satisfaction.     [name]

## 2015-01-25 ENCOUNTER — Telehealth: Payer: Self-pay | Admitting: Internal Medicine

## 2015-01-25 NOTE — Telephone Encounter (Signed)
New message    Patient calling     1. Has question regarding dressing changes.

## 2015-01-26 LAB — MDC_IDC_ENUM_SESS_TYPE_INCLINIC
Battery Voltage: 2.61 V
Brady Statistic RV Percent Paced: 0 %
Lead Channel Impedance Value: 67 Ohm
Lead Channel Sensing Intrinsic Amplitude: 31.36 mV
Lead Channel Setting Pacing Amplitude: 2.5 V
Lead Channel Setting Pacing Pulse Width: 0.4 ms
Lead Channel Setting Sensing Sensitivity: 5.6 mV
MDC IDC MSMT BATTERY IMPEDANCE: 6277 Ohm
MDC IDC MSMT BATTERY REMAINING LONGEVITY: 5 mo
MDC IDC MSMT LEADCHNL RV IMPEDANCE VALUE: 631 Ohm
MDC IDC SESS DTM: 20160215190804

## 2015-01-31 ENCOUNTER — Ambulatory Visit (INDEPENDENT_AMBULATORY_CARE_PROVIDER_SITE_OTHER): Payer: Medicare Other | Admitting: *Deleted

## 2015-01-31 DIAGNOSIS — I495 Sick sinus syndrome: Secondary | ICD-10-CM

## 2015-01-31 LAB — MDC_IDC_ENUM_SESS_TYPE_INCLINIC
Battery Remaining Longevity: 173 mo
Brady Statistic AP VP Percent: 0.1 %
Lead Channel Impedance Value: 563 Ohm
Lead Channel Pacing Threshold Amplitude: 0.5 V
Lead Channel Pacing Threshold Pulse Width: 0.4 ms
Lead Channel Pacing Threshold Pulse Width: 0.4 ms
Lead Channel Sensing Intrinsic Amplitude: 31.36 mV
Lead Channel Setting Pacing Amplitude: 1.5 V
Lead Channel Setting Pacing Amplitude: 2 V
Lead Channel Setting Pacing Pulse Width: 0.4 ms
MDC IDC MSMT LEADCHNL RA IMPEDANCE VALUE: 585 Ohm
MDC IDC MSMT LEADCHNL RA PACING THRESHOLD AMPLITUDE: 0.5 V
MDC IDC MSMT LEADCHNL RA SENSING INTR AMPL: 2.8 mV
MDC IDC STAT BRADY AP VS PERCENT: 29.2 %
MDC IDC STAT BRADY AS VP PERCENT: 0.2 %
MDC IDC STAT BRADY AS VS PERCENT: 70.5 %

## 2015-01-31 NOTE — Progress Notes (Signed)

## 2015-02-01 ENCOUNTER — Ambulatory Visit: Payer: 59

## 2015-02-03 ENCOUNTER — Ambulatory Visit: Payer: 59

## 2015-02-04 ENCOUNTER — Other Ambulatory Visit (INDEPENDENT_AMBULATORY_CARE_PROVIDER_SITE_OTHER): Payer: Medicare Other | Admitting: *Deleted

## 2015-02-04 DIAGNOSIS — E785 Hyperlipidemia, unspecified: Secondary | ICD-10-CM

## 2015-02-04 DIAGNOSIS — I119 Hypertensive heart disease without heart failure: Secondary | ICD-10-CM

## 2015-02-04 LAB — LIPID PANEL
CHOL/HDL RATIO: 4
CHOLESTEROL: 232 mg/dL — AB (ref 0–200)
HDL: 65.7 mg/dL (ref >39.00)
LDL Cholesterol: 145 mg/dL — ABNORMAL HIGH (ref 0–99)
NonHDL: 166.3
Triglycerides: 107 mg/dL (ref 0.0–149.0)
VLDL: 21.4 mg/dL (ref 0.0–40.0)

## 2015-02-04 LAB — BASIC METABOLIC PANEL
BUN: 17 mg/dL (ref 6–23)
CHLORIDE: 102 meq/L (ref 96–112)
CO2: 31 mEq/L (ref 19–32)
Calcium: 9.5 mg/dL (ref 8.4–10.5)
Creatinine, Ser: 0.91 mg/dL (ref 0.40–1.20)
GFR: 65.5 mL/min (ref >60.00)
Glucose, Bld: 102 mg/dL — ABNORMAL HIGH (ref 70–99)
POTASSIUM: 4.2 meq/L (ref 3.5–5.1)
Sodium: 137 mEq/L (ref 135–145)

## 2015-02-04 LAB — HEPATIC FUNCTION PANEL
ALT: 12 U/L (ref 0–35)
AST: 17 U/L (ref 0–37)
Albumin: 4.4 g/dL (ref 3.5–5.2)
Alkaline Phosphatase: 55 U/L (ref 39–117)
BILIRUBIN DIRECT: 0.1 mg/dL (ref 0.0–0.3)
BILIRUBIN TOTAL: 0.5 mg/dL (ref 0.2–1.2)
Total Protein: 7.1 g/dL (ref 6.0–8.3)

## 2015-02-04 NOTE — Progress Notes (Signed)
Quick Note:  Please make copy of labs for patient visit. ______ 

## 2015-02-08 ENCOUNTER — Encounter: Payer: Self-pay | Admitting: Cardiology

## 2015-02-08 ENCOUNTER — Ambulatory Visit (INDEPENDENT_AMBULATORY_CARE_PROVIDER_SITE_OTHER): Payer: Medicare Other | Admitting: Cardiology

## 2015-02-08 VITALS — BP 148/96 | HR 80 | Ht 64.0 in | Wt 123.8 lb

## 2015-02-08 DIAGNOSIS — I119 Hypertensive heart disease without heart failure: Secondary | ICD-10-CM

## 2015-02-08 MED ORDER — DILTIAZEM HCL ER COATED BEADS 120 MG PO CP24: 120.0000 mg | ORAL_CAPSULE | Freq: Two times a day (BID) | ORAL | Status: DC

## 2015-02-08 NOTE — Patient Instructions (Signed)
INCREASE YOUR DILTIAZEM TO 120 MG TWICE A DAY  Your physician wants you to follow-up in: 4 months with fasting labs (lp/bmet/hfp) AND EKG  You will receive a reminder letter in the mail two months in advance. If you don't receive a letter, please call our office to schedule the follow-up appointment.

## 2015-02-08 NOTE — Progress Notes (Signed)
Cardiology Office Note   Date:  02/08/2015   ID:  [name], DOB November 09, 1946, MRN [id number]  PCP:  Elsie Stain, MD  Cardiologist:   Darlin Coco, MD   No chief complaint on file.     History of Present Illness: [name] is a 69 y.o. female who presents for scheduled follow-up office visit. This pleasant 69 year old woman is seen for a scheduled followup office visit. He has a past history of essential hypertension and a history of symptomatic palpitations. She also has a functioning dual-chamber pacemaker implanted by Dr. Verlon Setting in about 2006. The patient has a history of atypical chest pain. She does not have any history of known ischemic heart disease. She does have a history of gastroesophageal reflux disease. She underwent upper endoscopy and colonoscopy in June 2014 by her gastroenterologist Dr. Fuller Plan. A few colonic polyps were found and she will need another colonoscopy in 5 years.  On 12/20/2014 patient underwent right knee surgery by Dr. Ronnie Derby with a total knee replacement.  Several weeks later she had a problem with palpitations and her pacemaker was interrogated and found to be at end-of-life.  The patient underwent placement of a new Medtronic generator on February 19.  She did well with the procedure. Her overall level of activity has been decreased recently because of her 2 surgical procedures.  She is getting physical therapy once a week at Dr. Jeoffrey Massed office now.   Past Medical History  Diagnosis Date  . Colon polyp     adenomatous  . Hemorrhoids   . Inguinal hernia   . Hyperlipidemia   . Sleep apnea     can't use c-pap  . Allergic rhinitis   . GERD (gastroesophageal reflux disease)   . Bradycardia     s/p MDT PPM  . Atypical chest pain   . Hypertension   . Pacemaker   . Skin cancer     Basal and squamous cell  . Anemia   . Arthritis   . Blood in stool   . History of chicken pox   . Depression   . Cardiac arrhythmia due to congenital heart  disease   . Thyroid disease   . UTI (urinary tract infection)   . Osteopenia 12/2012    T score -1.7 FRAX 8.5%/1.1%  . Eye problem     Vitrious detachment  . PONV (postoperative nausea and vomiting)   . Family history of adverse reaction to anesthesia     MALIGNANT HYPERTHERMIA IN PATIENT'S UNCLE AND NEPHEW  . 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 Cough     RECENT COLD   PT TX WITH ANTIBIOTIC  . Headache     MIGRAINES  . Dizziness   . History of hiatal hernia   . Frequent urination at night   . Scleritis of right eye   . Vitreous detachment of both eyes   . Heart murmur     reports h/o MVP    Past Surgical History  Procedure Laterality Date  . Thyroidectomy  1979    RIGHT SIDE  . Polypectomy    . Pacemaker insertion  2006    Dr. Verlon Setting  . Abdominal hysterectomy    . Knee arthroscopy  2011  . Cesarean section    . Dilation and curettage of uterus    . Thumb surgery    . Oophorectomy  1995    LSO and RSO  . Tear  duct surgery  2010  . Hand surgery  1992  . Inguinal hernia repair  02/12/1994    left inguinal hernia repair  . Hernia repair    . Breast surgery      Biopsy-benign  . Total knee arthroplasty Right 12/20/2014    Procedure: RIGHT TOTAL KNEE ARTHROPLASTY;  Surgeon: Vickey Huger, MD;  Location: [facility];  Service: Orthopedics;  Laterality: Right;  . Pacemaker generator change N/A 01/21/2015    Procedure: PACEMAKER GENERATOR CHANGE;  Surgeon: [name], MD;  Location: [facility] CATH [facility];  Service: Cardiovascular;  Laterality: N/A;     Current Outpatient Prescriptions  Medication Sig Dispense Refill  . acetaminophen (TYLENOL) 500 MG tablet Take 1,000 mg by mouth every 6 (six) hours as needed (pain).    . Carboxymethylcell-Hypromellose (GENTEAL) 0.25-0.3 % GEL Place 1 application into both eyes at bedtime.    Marland Kitchen diltiazem (CARDIZEM CD) 120 MG 24 hr capsule Take 1 capsule (120 mg total) by mouth 2 (two) times daily. 180 capsule 3  .  ibuprofen (ADVIL,MOTRIN) 200 MG tablet Take 200 mg by mouth every 6 (six) hours as needed for moderate pain.    Marland Kitchen LORazepam (ATIVAN) 1 MG tablet TAKE 1 TABLET BY MOUTH 4 TIMES A DAY AS NEEDED (Patient taking differently: TAKE 1 TABLET BY MOUTH 4 TIMES A DAY AS NEEDED FOR ANXIETY) 120 tablet 1  . nitroGLYCERIN (NITROSTAT) 0.4 MG SL tablet Place 1 tablet (0.4 mg total) under the tongue every 5 (five) minutes as needed. For up to 3 doses (Patient taking differently: Place 0.4 mg under the tongue every 5 (five) minutes as needed for chest pain. For up to 3 doses) 25 tablet 4  . nizatidine (AXID) 150 MG capsule TAKE (1) CAPSULE TWICE DAILY. 60 capsule 3  . Nutritional Supplements (EQUATE PO) Take 1 tablet by mouth daily as needed. For soft stool    . Polyethyl Glycol-Propyl Glycol (SYSTANE OP) Place 1 drop into both eyes daily as needed (dry eyes).    . traMADol (ULTRAM) 50 MG tablet Take 1-2 tablets (50-100 mg total) by mouth every 6 (six) hours as needed for moderate pain. 60 tablet 0   No current facility-administered medications for this visit.    Allergies:   Hydrocodone; Oxycodone; Antihistamines, loratadine-type; Bisoprolol-hydrochlorothiazide; Bystolic; Cephalexin; Codeine; Demerol; Epinephrine; Erythromycin; Hyoscyamine sulfate; Inderal; Lidocaine; Lipitor; Losartan potassium; Morphine; Oxycodone-acetaminophen; Paxil; Penicillins; Prednisone; Propranolol hcl; Sertraline hcl; Tetracycline; Toprol xl; and Verapamil    Social History:  The patient  reports that she has never smoked. She has never used smokeless tobacco. She reports that she does not drink alcohol or use illicit drugs.   Family History:  The patient's  family history includes Arthritis in her father; Breast cancer in her maternal aunt; Dementia in her mother; Diabetes in her maternal aunt and maternal uncle; Heart disease in her sister; Heart failure in her sister; Hypertension in her father, mother, and sister; Ovarian cancer in  her maternal aunt. There is no history of Colon cancer.    ROS:  Please see the history of present illness.   Otherwise, review of systems are positive for none.   All other systems are reviewed and negative.    PHYSICAL EXAM: VS:  BP 148/96 mmHg  Pulse 80  Ht 5\' 4"  (1.626 m)  Wt 123 lb 12.8 oz (56.155 kg)  BMI 21.24 kg/m2 , BMI Body mass index is 21.24 kg/(m^2). GEN: Well nourished, well developed, in no acute distress HEENT: normal Neck: no JVD, carotid  bruits, or masses Cardiac: RRR; no murmurs, rubs, or gallops,no edema.  There is a soft midsystolic click Respiratory:  clear to auscultation bilaterally, normal work of breathing GI: soft, nontender, nondistended, + BS MS: no deformity or atrophy Skin: warm and dry, no rash Neuro:  Strength and sensation are intact Psych: euthymic mood, full affect   EKG:  EKG is not ordered today.    Recent Labs: 01/13/2015: Hemoglobin 13.0; Magnesium 2.3; Platelets 246 02/04/2015: ALT 12; BUN 17; Creatinine 0.91; Potassium 4.2; Sodium 137    Lipid Panel    Component Value Date/Time   CHOL 232* 02/04/2015 1005   TRIG 107.0 02/04/2015 1005   HDL 65.70 02/04/2015 1005   CHOLHDL 4 02/04/2015 1005   VLDL 21.4 02/04/2015 1005   LDLCALC 145* 02/04/2015 1005   LDLDIRECT 146.6 01/25/2014 0953      Wt Readings from Last 3 Encounters:  02/08/15 123 lb 12.8 oz (56.155 kg)  01/21/15 123 lb (55.792 kg)  01/17/15 123 lb 1.9 oz (55.847 kg)        ASSESSMENT AND PLAN:  1. benign hypertensive heart disease without heart failure 2. Hyperlipidemia 3. sleep apnea 4. atypical chest discomfort 5. sick sinus syndrome with functioning dual-chamber pacemaker.  New generator (Medtronic) implanted on 01/21/15. 6. GERD 7. alopecia unknown cause  Plan: Continue same medication.  Recheck in 4 months for office visit, EKG, lipid panel hepatic function panel and basal metabolic panel   Current medicines are reviewed at length with the patient  today.  The patient does not have concerns regarding medicines.  The following changes have been made:  no change    Orders Placed This Encounter  Procedures  . Lipid panel  . Hepatic function panel  . Basic metabolic panel     Disposition: Recheck in 4 months for office visit EKG and fasting lab work.  Her blood pressure is higher today and we are going to increase her diltiazem 220 mg twice a day.  She will try to cut back on her ibuprofen which may be contributing to her higher blood pressure.   Signed, Darlin Coco, MD  02/08/2015 4:43 PM    [facility] [address], [place], [place]  [address] Phone: [phone]; Fax: [phone]

## 2015-02-15 ENCOUNTER — Telehealth: Payer: Self-pay | Admitting: Cardiology

## 2015-02-15 DIAGNOSIS — I119 Hypertensive heart disease without heart failure: Secondary | ICD-10-CM

## 2015-02-15 MED ORDER — DILTIAZEM HCL ER COATED BEADS 120 MG PO CP24: 120.0000 mg | ORAL_CAPSULE | Freq: Every day | ORAL | Status: DC

## 2015-02-15 NOTE — Telephone Encounter (Signed)
Advised patient, verbalized understanding  

## 2015-02-15 NOTE — Telephone Encounter (Signed)
Would reduce the diltiazem back to just once a day to cut down on swelling

## 2015-02-15 NOTE — Telephone Encounter (Signed)
New message    Pt c/o medication issue:  1. Name of Medication: diltiazem  120 mg   2. How are you currently taking this medication (dosage and times per day)? One pill at night .one in am   3. Are you having a reaction (difficulty breathing--STAT)? No   4. What is your medication issue? Feet/ ankle swollen.

## 2015-02-15 NOTE — Telephone Encounter (Signed)
Spoke with patient and since increasing the Diltiazem to twice a day has had increased swelling in her feet and legs Last week blood pressure 137/82 HR 81 No evening dose of Diltiazem last night, blood pressure this am 135/85 HR 79 (45 minutes after taking morning Diltiazem) Per patient swelling seems a little better today Will forward to  Dr. Mare Ferrari for review

## 2015-02-18 ENCOUNTER — Telehealth: Payer: Self-pay | Admitting: Cardiology

## 2015-02-18 ENCOUNTER — Encounter: Payer: Self-pay | Admitting: Internal Medicine

## 2015-02-18 MED ORDER — HYDRALAZINE HCL 10 MG PO TABS: 10.0000 mg | ORAL_TABLET | Freq: Two times a day (BID) | ORAL | Status: DC

## 2015-02-18 NOTE — Telephone Encounter (Signed)
Add hydralazine 10 mg by mouth twice a day to help with blood pressure control

## 2015-02-18 NOTE — Telephone Encounter (Signed)
Advised patient, verbalized understanding  

## 2015-02-18 NOTE — Telephone Encounter (Signed)
New Msg       Pt states her BP is up at this time, on Tuesday 135/85, Thurs. 139/89, today in left 147/93, rt arm 130/92.    Pt states adjustments have been made to medication and she needs to discuss this.

## 2015-02-18 NOTE — Telephone Encounter (Signed)
Patient is concerned with the trend upward with blood pressure Decreased Diltiazem secondary to LE swelling, since has improved Will forward to  Dr. Mare Ferrari for review

## 2015-03-02 ENCOUNTER — Telehealth: Payer: Self-pay | Admitting: Internal Medicine

## 2015-03-02 NOTE — Telephone Encounter (Signed)
Informed pt that it would be ok to have mammogram on 03-07-15. Pt verbalized understanding.

## 2015-03-02 NOTE — Telephone Encounter (Signed)
New message       Pt had a pacemaker replaced in feb.  Can she have a mammogram on Monday, April 4?

## 2015-03-07 ENCOUNTER — Ambulatory Visit
Admission: RE | Admit: 2015-03-07 | Discharge: 2015-03-07 | Disposition: A | Discharge status: Home / Self Care | Payer: Medicare Other | Source: Ambulatory Visit

## 2015-03-07 DIAGNOSIS — Z1231 Encounter for screening mammogram for malignant neoplasm of breast: Secondary | ICD-10-CM

## 2015-03-30 ENCOUNTER — Telehealth: Payer: Self-pay | Admitting: Internal Medicine

## 2015-03-30 NOTE — Telephone Encounter (Signed)
Spoke with patient- place on incision bothering her, she thinks it may be a stitch. Scheduled device clinic appt for tomorrow at 3pm.

## 2015-03-30 NOTE — Telephone Encounter (Signed)
New problem   Pt need to speak to nurse concerning she thinks she still has a stitch at at incision site. Please call pt.

## 2015-03-31 ENCOUNTER — Telehealth: Payer: Self-pay | Admitting: Cardiology

## 2015-03-31 ENCOUNTER — Ambulatory Visit (INDEPENDENT_AMBULATORY_CARE_PROVIDER_SITE_OTHER): Payer: Medicare Other | Admitting: *Deleted

## 2015-03-31 DIAGNOSIS — I495 Sick sinus syndrome: Secondary | ICD-10-CM

## 2015-03-31 LAB — MDC_IDC_ENUM_SESS_TYPE_INCLINIC
Battery Impedance: 100 Ohm
Battery Remaining Longevity: 169 mo
Battery Voltage: 2.79 V
Brady Statistic AP VS Percent: 50 %
Brady Statistic AS VS Percent: 49 %
Date Time Interrogation Session: 20160428162738
Lead Channel Impedance Value: 517 Ohm
Lead Channel Impedance Value: 571 Ohm
Lead Channel Pacing Threshold Amplitude: 0.75 V
Lead Channel Pacing Threshold Pulse Width: 0.4 ms
Lead Channel Sensing Intrinsic Amplitude: 2.8 mV
Lead Channel Setting Pacing Amplitude: 2 V
Lead Channel Setting Sensing Sensitivity: 5.6 mV
MDC IDC MSMT LEADCHNL RV PACING THRESHOLD AMPLITUDE: 0.75 V
MDC IDC MSMT LEADCHNL RV PACING THRESHOLD PULSEWIDTH: 0.4 ms
MDC IDC MSMT LEADCHNL RV SENSING INTR AMPL: 22.4 mV
MDC IDC SET LEADCHNL RA PACING AMPLITUDE: 1.5 V
MDC IDC SET LEADCHNL RV PACING PULSEWIDTH: 0.4 ms
MDC IDC STAT BRADY AP VP PERCENT: 0 %
MDC IDC STAT BRADY AS VP PERCENT: 0 %

## 2015-03-31 NOTE — Telephone Encounter (Signed)
Pt c/o medication issue:  1. Name of Medication: Hydrolazine  2. How are you currently taking this medication (dosage and times per day)? 10 mg 2x per day  3. Are you having a reaction (difficulty breathing--STAT)? No  4. What is your medication issue? Pt calling wanting to ask a few questions about the amount of this medication she is taking. Please call back and advise.

## 2015-03-31 NOTE — Progress Notes (Signed)
Add on pacemaker wound check for stitch at incision site.  End of stitch pulled from pinhole right side of incision. Pt c/o short episodes of fast heart rates. Pacemaker checked for episodes- no atrial or ventricular high rates. Normal device function.   See paceart report for details.  Debroah Loop, RN

## 2015-03-31 NOTE — Telephone Encounter (Signed)
Patient blood pressure down to 124/81 and 122/74 so she decreased her Hydralazine to once daily  Blood pressure went up to 146/94 gradually  Discussed with  Dr. Mare Ferrari and will have her go back to twice a day, advised patient Patient did state that she had been having episodes lasting 4-5 minutes of fast heart rate, unsure of rate Advised patient to try counting and checking blood pressure at that time Patient will call back with heart rate readings

## 2015-04-05 ENCOUNTER — Ambulatory Visit: Payer: Medicare Other | Admitting: Internal Medicine

## 2015-04-07 ENCOUNTER — Ambulatory Visit (INDEPENDENT_AMBULATORY_CARE_PROVIDER_SITE_OTHER): Payer: Medicare Other | Admitting: Internal Medicine

## 2015-04-07 ENCOUNTER — Encounter: Payer: Self-pay | Admitting: Internal Medicine

## 2015-04-07 VITALS — BP 144/88 | HR 65 | Ht 64.5 in | Wt 127.4 lb

## 2015-04-07 DIAGNOSIS — G4733 Obstructive sleep apnea (adult) (pediatric): Secondary | ICD-10-CM

## 2015-04-07 NOTE — Assessment & Plan Note (Signed)
Ongoing obstructive sleep apnea obvious from husband's description of her breathing while asleep and documented on 2 different polysomnograms. She has tried twice but does not tolerate CPAP despite adjustment efforts. She was put off by original evaluation for oral appliance and description of cost because "cure" could not be guaranteed. She understands untreated sleep apnea is not good for her heart and she is willing to get a second opinion about an oral appliance. Plan-orthodontic referral to consider oral appliance for treating obstructive sleep apnea.

## 2015-04-07 NOTE — Progress Notes (Signed)
05/21/14- 67 yoF never smoker-COMPLAINS OF: Old pt of CDY-Hx of OSA,  Diagnosed obstructive sleep apnea 2004, AHI 18 per hour. She did not tolerate CPAP. She looked into an oral appliance it was too expensive. Husband now complains that she gasps and snores loudly in her sleep. Arthritis limits sleep positions. Sleep is unrefreshing and she is sleepy if she sits quietly. Bedtime 10:30 or 11:30 PM, short latency, waking 4 or5 times before up around 7:30 AM. Occasional lorazepam for sleep. ENT surgery for repair of lacrimal duct. History of deviated septum. Partial thyroidectomy. Not much seasonal rhinitis. Pacemaker for palpitations.  07/15/14- 72 yo F never smoker followed for OSA NPSG 07/06/14- Moderate OSA AHI 22/ hr, CPAP titrated to 13, weight 124 lbs Husband here We discussed her sleep study, reviewed the data, and discussed treatment options. She is quite petite but definitely has obstructive sleep apnea. She had not tolerated CPAP 11 years ago but is willing to try again. We discussed medical concerns, CPAP and oral appliances.  10/05/14- 30 yo F never smoker followed for OSA FOLLOWS FOR: Pt states she has not worn CPAP in 2 weeks. Pt states she issues with the several masks she has had in the past. Pt c/o leaking in the mask.  CPAP 13/ [employer] indicates not enough use, pressure set at 11 and not good enough control In the past she had felt oral appliances were too expensive.  04/07/15- 49 yo F never smoker followed for OSA complicated by sick sinus/ pacemaker, GERD, HBP          Husband here FOLLOWS FOR: Has not worn her CPAP at all; hard time adjusting to machine-keeping her awake more than sleeping. Had knee surgery but prior to that got sick with virus several times. Then had to have pacemaker battery replaced.  She has never been able to tolerate CPAP. Since last here has been uncomfortable through TKR, 2 episodes of bronchitis, and not inclined to struggle with CPAP. She agrees to  explore oral appliance again. Husband confirms that she snores loudly and has obvious apnea events.  ROS-see HPI Constitutional:   No-   weight loss, night sweats, fevers, chills, +fatigue, lassitude. HEENT:   No-  headaches, difficulty swallowing, tooth/dental problems, sore throat,       No-  sneezing, itching, ear ache, nasal congestion, post nasal drip,  CV:  No-   chest pain, orthopnea, PND, swelling in lower extremities, anasarca,  dizziness, palpitations Resp: No-   shortness of breath with exertion or at rest.              No-   productive cough,  No non-productive cough,  No- coughing up of blood.              No-   change in color of mucus.  No- wheezing.   Skin: No-   rash or lesions. GI:  No-   heartburn, indigestion, abdominal pain, nausea, vomiting,  GU:  MS:  No-   joint pain or swelling.  . Neuro-     nothing unusual Psych:  No- change in mood or affect. No depression or anxiety.  No memory loss.  OBJ- Physical Exam General- Alert, Oriented, Affect-appropriate, Distress- none acute, trim Skin- rash-none, lesions- none, excoriation- none Lymphadenopathy- none Head- atraumatic            Eyes- Gross vision intact, PERRLA, conjunctivae and secretions clear            Ears- Hearing, canals-normal  Nose- +narrow nose, Clear, no-Septal dev, mucus, polyps, erosion, perforation             Throat- Mallampati III-IV, mucosa clear , drainage- none, tonsils- atrophic Neck- flexible , trachea midline, no stridor , thyroid nl, carotid no bruit Chest - symmetrical excursion , unlabored           Heart/CV- RRR , no murmur , no gallop  , no rub, nl s1 s2                           - JVD- none , edema- none, stasis changes- none, varices- none           Lung- clear to P&A, wheeze- none, cough- none , dullness-none, rub- none           Chest wall-  Pacemaker L Abd-  Br/ Gen/ Rectal- Not done, not indicated Extrem- cyanosis- none, clubbing, none, atrophy- none, strength-  nl Neuro- grossly intact to observation

## 2015-04-07 NOTE — Patient Instructions (Signed)
Order- refer to orthodontist Dr Oneal Grout   Consider oral appliance for OSA

## 2015-04-11 ENCOUNTER — Encounter: Payer: Self-pay | Admitting: Internal Medicine

## 2015-04-12 ENCOUNTER — Telehealth: Payer: Self-pay | Admitting: Cardiology

## 2015-04-12 NOTE — Telephone Encounter (Signed)
Stop hydralazine because of side effects.  Try lisinopril 5 mg one daily to help blood pressure

## 2015-04-12 NOTE — Telephone Encounter (Signed)
Patient has been taking her blood pressure about every other day Blood pressure 132-139/77-85  Patient has headaches every day off and on, starts about 2 hours after taking the Hydralazine  Also seems like she has noticed her heart "skipping beats" more since starting  Patient stated she just did not think this medication was agreeing with her Will forward to  Dr. Mare Ferrari for review

## 2015-04-12 NOTE — Telephone Encounter (Signed)
Pt c/o medication issue:  1. Name of Medication: Hydralazine  2. How are you currently taking this medication (dosage and times per day)? 10mg  2 times a day  3. Are you having a reaction (difficulty breathing--STAT)? Tired and headaches every day for the past 2 weeks  4. What is your medication issue? Discuss changing bp medication

## 2015-04-12 NOTE — Telephone Encounter (Signed)
Left message to call back  

## 2015-04-13 MED ORDER — LISINOPRIL 5 MG PO TABS: 5.0000 mg | ORAL_TABLET | Freq: Every day | ORAL | Status: DC

## 2015-04-13 NOTE — Telephone Encounter (Signed)
Advised patient, verbalized understanding  

## 2015-04-13 NOTE — Telephone Encounter (Signed)
New Message   Patient is returning the nurses phone call. Please give the patient a call back.  THanks

## 2015-04-25 ENCOUNTER — Encounter: Payer: Self-pay | Admitting: Internal Medicine

## 2015-04-25 ENCOUNTER — Ambulatory Visit (INDEPENDENT_AMBULATORY_CARE_PROVIDER_SITE_OTHER): Payer: Medicare Other | Admitting: Internal Medicine

## 2015-04-25 VITALS — BP 160/90 | HR 79 | Ht 64.5 in | Wt 126.8 lb

## 2015-04-25 DIAGNOSIS — I119 Hypertensive heart disease without heart failure: Secondary | ICD-10-CM

## 2015-04-25 DIAGNOSIS — I495 Sick sinus syndrome: Secondary | ICD-10-CM | POA: Diagnosis not present

## 2015-04-25 LAB — CUP PACEART INCLINIC DEVICE CHECK
Battery Impedance: 100 Ohm
Battery Voltage: 2.79 V
Brady Statistic AP VS Percent: 68 %
Brady Statistic AS VP Percent: 0 %
Brady Statistic AS VS Percent: 32 %
Date Time Interrogation Session: 20160523172123
Lead Channel Impedance Value: 568 Ohm
Lead Channel Impedance Value: 568 Ohm
Lead Channel Pacing Threshold Amplitude: 0.75 V
Lead Channel Pacing Threshold Amplitude: 0.75 V
Lead Channel Pacing Threshold Pulse Width: 0.4 ms
Lead Channel Pacing Threshold Pulse Width: 0.4 ms
Lead Channel Sensing Intrinsic Amplitude: 2.8 mV
Lead Channel Setting Pacing Amplitude: 2 V
MDC IDC MSMT BATTERY REMAINING LONGEVITY: 168 mo
MDC IDC MSMT LEADCHNL RV SENSING INTR AMPL: 31.36 mV
MDC IDC SET LEADCHNL RV PACING AMPLITUDE: 2.5 V
MDC IDC SET LEADCHNL RV PACING PULSEWIDTH: 0.4 ms
MDC IDC SET LEADCHNL RV SENSING SENSITIVITY: 5.6 mV
MDC IDC STAT BRADY AP VP PERCENT: 0 %

## 2015-04-25 NOTE — Patient Instructions (Signed)
Medication Instructions:  Your physician recommends that you continue on your current medications as directed. Please refer to the Current Medication list given to you today.   Labwork: None ordered  Testing/Procedures: None ordered  Follow-Up: Your physician wants you to follow-up in: 12 months with Amber Seiler, NP You will receive a reminder letter in the mail two months in advance. If you don't receive a letter, please call our office to schedule the follow-up appointment.   Remote monitoring is used to monitor your Pacemaker or ICD from home. This monitoring reduces the number of office visits required to check your device to one time per year. It allows us to keep an eye on the functioning of your device to ensure it is working properly. You are scheduled for a device check from home on 07/25/15. You may send your transmission at any time that day. If you have a wireless device, the transmission will be sent automatically. After your physician reviews your transmission, you will receive a postcard with your next transmission date.    Any Other Special Instructions Will Be Listed Below (If Applicable).   

## 2015-04-25 NOTE — Progress Notes (Signed)
PCP: [name] Stain, MD Primary Cardiologist:  [name] is a 69 y.o. female who presents today for routine electrophysiology followup.  She has done well since her recent gen change.  She has rare palpitations (device reveals PVCs).  She also has rare "twinges" with her heart which we think could be due to auto capture testing--> this is changed today.  Today, she denies symptoms of chest pain, shortness of breath,  lower extremity edema, dizziness, presyncope, or syncope.  No arrhythmias on device interrogation today. Still notices an intermittent pounding. The patient is otherwise without complaint today.   Past Medical History  Diagnosis Date  . Colon polyp     adenomatous  . Hemorrhoids   . Inguinal hernia   . Hyperlipidemia   . Sleep apnea     can't use c-pap  . Allergic rhinitis   . GERD (gastroesophageal reflux disease)   . Bradycardia     s/p MDT PPM  . Atypical chest pain   . Hypertension   . Pacemaker   . Skin cancer     Basal and squamous cell  . Anemia   . Arthritis   . Blood in stool   . History of chicken pox   . Depression   . Cardiac arrhythmia due to congenital heart disease   . Thyroid disease   . UTI (urinary tract infection)   . Osteopenia 12/2012    T score -1.7 FRAX 8.5%/1.1%  . Eye problem     Vitrious detachment  . PONV (postoperative nausea and vomiting)   . Family history of adverse reaction to anesthesia     MALIGNANT HYPERTHERMIA IN PATIENT'S UNCLE AND NEPHEW  . 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 Cough     RECENT COLD   PT TX WITH ANTIBIOTIC  . Headache     MIGRAINES  . Dizziness   . History of hiatal hernia   . Frequent urination at night   . Scleritis of right eye   . Vitreous detachment of both eyes   . Heart murmur     reports h/o MVP   Past Surgical History  Procedure Laterality Date  . Thyroidectomy  1979    RIGHT SIDE  . Polypectomy    . Pacemaker insertion  2006   [name]. Verlon Setting  . Abdominal hysterectomy    . Knee arthroscopy  2011  . Cesarean section    . Dilation and curettage of uterus    . Thumb surgery    . Oophorectomy  1995    LSO and RSO  . Tear duct surgery  2010  . Hand surgery  1992  . Inguinal hernia repair  02/12/1994    left inguinal hernia repair  . Hernia repair    . Breast surgery      Biopsy-benign  . Total knee arthroplasty Right 12/20/2014    Procedure: RIGHT TOTAL KNEE ARTHROPLASTY;  Surgeon: Vickey Huger, MD;  Location: [facility];  Service: Orthopedics;  Laterality: Right;  . Pacemaker generator change N/A 01/21/2015    Procedure: PACEMAKER GENERATOR CHANGE;  Surgeon: [name], MD;  Location: [facility] C[facility];  Service: Cardiovascular;  Laterality: N/A;    Current Outpatient Prescriptions  Medication Sig Dispense Refill  . acetaminophen (TYLENOL) 500 MG tablet Take 1,000 mg by mouth every 6 (six) hours as needed (pain).    . Carboxymethylcell-Hypromellose (GENTEAL) 0.25-0.3 % GEL Place 1 application into both eyes at bedtime.    Marland Kitchen  diltiazem (CARDIZEM CD) 120 MG 24 hr capsule Take 1 capsule (120 mg total) by mouth daily. 180 capsule 3  . ibuprofen (ADVIL,MOTRIN) 200 MG tablet Take 200 mg by mouth every 6 (six) hours as needed for moderate pain.    Marland Kitchen LORazepam (ATIVAN) 1 MG tablet TAKE 1 TABLET BY MOUTH 4 TIMES A DAY AS NEEDED (Patient taking differently: TAKE 1 TABLET BY MOUTH 4 TIMES A DAY AS NEEDED FOR ANXIETY) 120 tablet 1  . nitroGLYCERIN (NITROSTAT) 0.4 MG SL tablet Place 1 tablet (0.4 mg total) under the tongue every 5 (five) minutes as needed. For up to 3 doses (Patient taking differently: Place 0.4 mg under the tongue every 5 (five) minutes as needed for chest pain. For up to 3 doses) 25 tablet 4  . nizatidine (AXID) 150 MG capsule TAKE (1) CAPSULE TWICE DAILY. (Patient taking differently: TAKE (1) CAPSULE BY MOUTH TWICE DAILY.) 60 capsule 3  . Polyethyl Glycol-Propyl Glycol (SYSTANE OP) Place 1 drop into both eyes daily as  needed (dry eyes).    . traMADol (ULTRAM) 50 MG tablet Take 1-2 tablets (50-100 mg total) by mouth every 6 (six) hours as needed for moderate pain. 60 tablet 0   No current facility-administered medications for this visit.   ROS- all systems are reviewed and negative except as per HPI above  Physical Exam: Filed Vitals:   04/25/15 1456  BP: 160/90  Pulse: 79  Height: 5' 4.5" (1.638 m)  Weight: 57.516 kg (126 lb 12.8 oz)    GEN- The patient is well appearing, alert and oriented x 3 today.   Head- normocephalic, atraumatic Eyes-  Sclera clear, conjunctiva pink Ears- hearing intact Oropharynx- clear Lungs- Clear to ausculation bilaterally, normal work of breathing Chest- pacemaker pocket is well healed Heart- Regular rate and rhythm, no murmurs, rubs or gallops, PMI not laterally displaced GI- soft, NT, ND, + BS Extremities- no clubbing, cyanosis, or edema  Pacemaker interrogation- reviewed in detail today,  See PACEART report  Assessment and Plan:  1. Sick sinus syndrome Normal pacemaker function See Pace Art report autocapture turned off today   2. HTN  Stable No change required today  carelink Return to see EP NP in 1 year  [name] MD, [facility] 04/25/2015 3:40 PM

## 2015-05-10 ENCOUNTER — Telehealth: Payer: Self-pay | Admitting: *Deleted

## 2015-05-10 NOTE — Telephone Encounter (Signed)
Called patient regarding missing serial number for Carelink Smart monitor.  Patient gave serial number, will pair in Carelink so patient's monitor ready for remote in August.

## 2015-05-17 ENCOUNTER — Encounter: Payer: Self-pay | Admitting: Internal Medicine

## 2015-06-09 ENCOUNTER — Other Ambulatory Visit (INDEPENDENT_AMBULATORY_CARE_PROVIDER_SITE_OTHER): Payer: Medicare Other | Admitting: *Deleted

## 2015-06-09 DIAGNOSIS — I119 Hypertensive heart disease without heart failure: Secondary | ICD-10-CM

## 2015-06-09 LAB — HEPATIC FUNCTION PANEL
ALT: 13 U/L (ref 0–35)
AST: 16 U/L (ref 0–37)
Albumin: 4 g/dL (ref 3.5–5.2)
Alkaline Phosphatase: 46 U/L (ref 39–117)
Bilirubin, Direct: 0.1 mg/dL (ref 0.0–0.3)
Total Bilirubin: 0.5 mg/dL (ref 0.2–1.2)
Total Protein: 6.6 g/dL (ref 6.0–8.3)

## 2015-06-09 LAB — BASIC METABOLIC PANEL
BUN: 20 mg/dL (ref 6–23)
CO2: 30 meq/L (ref 19–32)
CREATININE: 0.9 mg/dL (ref 0.40–1.20)
Calcium: 9.2 mg/dL (ref 8.4–10.5)
Chloride: 102 mEq/L (ref 96–112)
GFR: 66.27 mL/min (ref >60.00)
GLUCOSE: 89 mg/dL (ref 70–99)
Potassium: 3.9 mEq/L (ref 3.5–5.1)
Sodium: 139 mEq/L (ref 135–145)

## 2015-06-09 LAB — LIPID PANEL
CHOL/HDL RATIO: 4
CHOLESTEROL: 200 mg/dL (ref 0–200)
HDL: 57.1 mg/dL (ref >39.00)
LDL Cholesterol: 127 mg/dL — ABNORMAL HIGH (ref 0–99)
NONHDL: 142.9
Triglycerides: 80 mg/dL (ref 0.0–149.0)
VLDL: 16 mg/dL (ref 0.0–40.0)

## 2015-06-09 NOTE — Progress Notes (Signed)
Quick Note:  Please make copy of labs for patient visit. ______ 

## 2015-06-14 ENCOUNTER — Ambulatory Visit (INDEPENDENT_AMBULATORY_CARE_PROVIDER_SITE_OTHER): Payer: Medicare Other | Admitting: Cardiology

## 2015-06-14 ENCOUNTER — Encounter: Payer: Self-pay | Admitting: Cardiology

## 2015-06-14 VITALS — BP 160/80 | HR 67 | Ht 64.5 in | Wt 124.0 lb

## 2015-06-14 DIAGNOSIS — E7800 Pure hypercholesterolemia, unspecified: Secondary | ICD-10-CM

## 2015-06-14 DIAGNOSIS — I119 Hypertensive heart disease without heart failure: Secondary | ICD-10-CM

## 2015-06-14 DIAGNOSIS — R002 Palpitations: Secondary | ICD-10-CM | POA: Diagnosis not present

## 2015-06-14 NOTE — Progress Notes (Signed)
Cardiology Office Note   Date:  06/14/2015   ID:  [name], DOB [birth year], MRN [id number]  PCP:  Elsie Stain, MD  Cardiologist: Darlin Coco MD  Chief Complaint  Patient presents with  . Leg Swelling      History of Present Illness: [name] is a 69 y.o. female who presents for a four-month office visit.  This pleasant 69 year old woman is seen for a scheduled followup office visit. He has a past history of essential hypertension and a history of symptomatic palpitations. She also has a functioning dual-chamber pacemaker implanted by Dr. Verlon Setting in about 2006. The patient has a history of atypical chest pain. She does not have any history of known ischemic heart disease. She does have a history of gastroesophageal reflux disease. She underwent upper endoscopy and colonoscopy in June 2014 by her gastroenterologist Dr. Fuller Plan. A few colonic polyps were found and she will need another colonoscopy in 5 years.  On 12/20/2014 patient underwent right knee surgery by Dr. Ronnie Derby with a total knee replacement. Several weeks later she had a problem with palpitations and her pacemaker was interrogated and found to be at end-of-life. The patient underwent placement of a new Medtronic generator on February 19. She did well with the procedure.  She is now followed by Dr. Rayann Heman. She saw Dr. Ronnie Derby recently about some popping sensation in her right knee. She has been having some sense of fullness in the left lower quadrant of her abdomen just below her previous inguinal hernia incision.  She denies any significant problem with constipation.  Past Medical History  Diagnosis Date  . Colon polyp     adenomatous  . Hemorrhoids   . Inguinal hernia   . Hyperlipidemia   . Sleep apnea     can't use c-pap  . Allergic rhinitis   . GERD (gastroesophageal reflux disease)   . Bradycardia     s/p MDT PPM  . Atypical chest pain   . Hypertension   . Pacemaker   . Skin cancer     Basal  and squamous cell  . Anemia   . Arthritis   . Blood in stool   . History of chicken pox   . Depression   . Cardiac arrhythmia due to congenital heart disease   . Thyroid disease   . UTI (urinary tract infection)   . Osteopenia 12/2012    T score -1.7 FRAX 8.5%/1.1%  . Eye problem     Vitrious detachment  . PONV (postoperative nausea and vomiting)   . Family history of adverse reaction to anesthesia     MALIGNANT HYPERTHERMIA IN PATIENT'S UNCLE AND NEPHEW  . 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 Cough     RECENT COLD   PT TX WITH ANTIBIOTIC  . Headache     MIGRAINES  . Dizziness   . History of hiatal hernia   . Frequent urination at night   . Scleritis of right eye   . Vitreous detachment of both eyes   . Heart murmur     reports h/o MVP    Past Surgical History  Procedure Laterality Date  . Thyroidectomy  1979    RIGHT SIDE  . Polypectomy    . Pacemaker insertion  2006    Dr. Verlon Setting  . Abdominal hysterectomy    . Knee arthroscopy  2011  . Cesarean section    . Dilation and curettage of  uterus    . Thumb surgery    . Oophorectomy  1995    LSO and RSO  . Tear duct surgery  2010  . Hand surgery  1992  . Inguinal hernia repair  02/12/1994    left inguinal hernia repair  . Hernia repair    . Breast surgery      Biopsy-benign  . Total knee arthroplasty Right 12/20/2014    Procedure: RIGHT TOTAL KNEE ARTHROPLASTY;  Surgeon: Vickey Huger, MD;  Location: [facility];  Service: Orthopedics;  Laterality: Right;  . Pacemaker generator change N/A 01/21/2015    Procedure: PACEMAKER GENERATOR CHANGE;  Surgeon: [name], MD;  Location: [facility] CATH LAB;  Service: Cardiovascular;  Laterality: N/A;     Current Outpatient Prescriptions  Medication Sig Dispense Refill  . acetaminophen (TYLENOL) 500 MG tablet Take 1,000 mg by mouth every 6 (six) hours as needed (pain).    . Carboxymethylcell-Hypromellose (GENTEAL) 0.25-0.3 % GEL Place 1 application  into both eyes at bedtime.    Marland Kitchen diltiazem (CARDIZEM CD) 120 MG 24 hr capsule Take 1 capsule (120 mg total) by mouth daily. 180 capsule 3  . ibuprofen (ADVIL,MOTRIN) 200 MG tablet Take 200 mg by mouth every 6 (six) hours as needed for moderate pain.    Marland Kitchen lisinopril (PRINIVIL,ZESTRIL) 5 MG tablet Take 5 mg by mouth daily.     Marland Kitchen LORazepam (ATIVAN) 1 MG tablet TAKE 1 TABLET BY MOUTH 4 TIMES A DAY AS NEEDED (Patient taking differently: TAKE 1 TABLET BY MOUTH 4 TIMES A DAY AS NEEDED FOR ANXIETY) 120 tablet 1  . nitroGLYCERIN (NITROSTAT) 0.4 MG SL tablet Place 1 tablet (0.4 mg total) under the tongue every 5 (five) minutes as needed. For up to 3 doses (Patient taking differently: Place 0.4 mg under the tongue every 5 (five) minutes as needed for chest pain. For up to 3 doses) 25 tablet 4  . nizatidine (AXID) 150 MG capsule TAKE (1) CAPSULE TWICE DAILY. (Patient taking differently: TAKE (1) CAPSULE BY MOUTH TWICE DAILY.) 60 capsule 3  . Polyethyl Glycol-Propyl Glycol (SYSTANE OP) Place 1 drop into both eyes daily as needed (dry eyes).     No current facility-administered medications for this visit.    Allergies:   Hydrocodone; Oxycodone; Antihistamines, loratadine-type; Bisoprolol-hydrochlorothiazide; Bystolic; Cephalexin; Codeine; Demerol; Epinephrine; Erythromycin; Hyoscyamine sulfate; Inderal; Lidocaine; Lipitor; Losartan potassium; Morphine; Oxycodone-acetaminophen; Paxil; Penicillins; Prednisone; Propranolol hcl; Sertraline hcl; Tetracycline; Toprol xl; Verapamil; and Hydralazine    Social History:  The patient  reports that she has never smoked. She has never used smokeless tobacco. She reports that she does not drink alcohol or use illicit drugs.   Family History:  The patient's family history includes Arthritis in her father; Breast cancer in her maternal aunt; Dementia in her mother; Diabetes in her maternal aunt and maternal uncle; Heart disease in her sister; Heart failure in her sister;  Hypertension in her father, mother, and sister; Ovarian cancer in her maternal aunt. There is no history of Colon cancer.    ROS:  Please see the history of present illness.   Otherwise, review of systems are positive for none.   All other systems are reviewed and negative.    PHYSICAL EXAM: VS:  BP 160/80 mmHg  Pulse 67  Ht 5' 4.5" (1.638 m)  Wt 124 lb (56.246 kg)  BMI 20.96 kg/m2 , BMI Body mass index is 20.96 kg/(m^2). GEN: Well nourished, well developed, in no acute distress HEENT: normal Neck: no JVD, carotid bruits, or  masses Cardiac: RRR; there is a soft systolic ejection murmur at the left sternal edge. Respiratory:  clear to auscultation bilaterally, normal work of breathing GI: soft, nontender, nondistended, + BS.  Below the left inguinal hernia incision is a sense of fullness which may be stool in her sigmoid colon.  I don't detect any recurrence of the hernia itself.  However should persist she may wish to let her surgeon check her again. MS: no deformity or atrophy Skin: warm and dry, no rash Neuro:  Strength and sensation are intact Psych: euthymic mood, full affect   EKG:  EKG is ordered today. The ekg ordered today demonstrates atrial paced rhythm.  Left anterior fascicular block.   Recent Labs: 01/13/2015: Hemoglobin 13.0; Magnesium 2.3; Platelets 246 06/09/2015: ALT 13; BUN 20; Creatinine, Ser 0.90; Potassium 3.9; Sodium 139    Lipid Panel    Component Value Date/Time   CHOL 200 06/09/2015 0906   TRIG 80.0 06/09/2015 0906   HDL 57.10 06/09/2015 0906   CHOLHDL 4 06/09/2015 0906   VLDL 16.0 06/09/2015 0906   LDLCALC 127* 06/09/2015 0906   LDLDIRECT 146.6 01/25/2014 0953      Wt Readings from Last 3 Encounters:  06/14/15 124 lb (56.246 kg)  04/25/15 126 lb 12.8 oz (57.516 kg)  04/07/15 127 lb 6.4 oz (57.788 kg)        ASSESSMENT AND PLAN: 1. benign hypertensive heart disease without heart failure 2. Hyperlipidemia 3. sleep apnea 4. atypical chest  discomfort 5. sick sinus syndrome with functioning dual-chamber pacemaker. New generator (Medtronic) implanted on 01/21/15. 6. GERD 7.  Osteoarthritis.  Recent right total knee replacement  Plan: Continue same medication. She has not yet started on the lisinopril because at home her blood pressures have been normal without it.  She brought in her home machine which checks out accurately with our machine here.  Today both machines agreed that her blood pressure was up.  She does have an element of whitecoat syndrome.  Recheck in 4 months for office visit, EKG, lipid panel hepatic function panel and basal metabolic panel   Current medicines are reviewed at length with the patient today.  The patient does not have concerns regarding medicines.  The following changes have been made:  no change  Labs/ tests ordered today include:   Orders Placed This Encounter  Procedures  . Hepatic function panel  . Lipid panel  . Basic metabolic panel  . EKG 12-Lead     Disposition: Continue current medicines.  Recheck in 4 months for office visit and fasting lab work  Signed, Darlin Coco MD 06/14/2015 4:57 PM    [address], [place], [place]  [address] Phone: [phone]; Fax: [phone]

## 2015-06-14 NOTE — Patient Instructions (Signed)
Medication Instructions:  Your physician recommends that you continue on your current medications as directed. Please refer to the Current Medication list given to you today.  Labwork: NONE  Testing/Procedures: NONE  Follow-Up: Your physician wants you to follow-up in: 4 months with fasting labs (lp/bmet/hfp)  You will receive a reminder letter in the mail two months in advance. If you don't receive a letter, please call our office to schedule the follow-up appointment.     

## 2015-06-21 ENCOUNTER — Telehealth: Payer: Self-pay | Admitting: Gastroenterology

## 2015-06-21 NOTE — Telephone Encounter (Signed)
Patient with a LLQ bulge and tenderness with activity for the last few months.  She will come see Dr. Fuller Plan on 07/04/15 3:00

## 2015-06-21 NOTE — Telephone Encounter (Deleted)
Patient is notified of rx by short stay.

## 2015-06-28 ENCOUNTER — Other Ambulatory Visit: Payer: Self-pay

## 2015-06-28 MED ORDER — NIZATIDINE 150 MG PO CAPS: ORAL_CAPSULE | ORAL | Status: DC

## 2015-07-04 ENCOUNTER — Encounter: Payer: Self-pay | Admitting: Gastroenterology

## 2015-07-04 ENCOUNTER — Ambulatory Visit (INDEPENDENT_AMBULATORY_CARE_PROVIDER_SITE_OTHER): Payer: Medicare Other | Admitting: Gastroenterology

## 2015-07-04 VITALS — BP 126/72 | HR 76 | Ht 63.78 in | Wt 125.2 lb

## 2015-07-04 DIAGNOSIS — R590 Localized enlarged lymph nodes: Secondary | ICD-10-CM

## 2015-07-04 DIAGNOSIS — Z86010 Personal history of colonic polyps: Secondary | ICD-10-CM

## 2015-07-04 DIAGNOSIS — K648 Other hemorrhoids: Secondary | ICD-10-CM

## 2015-07-04 DIAGNOSIS — R599 Enlarged lymph nodes, unspecified: Secondary | ICD-10-CM

## 2015-07-04 NOTE — Progress Notes (Signed)
    History of Present Illness: This is a 69 year old female complaining of a small lump in her inguinal area and intermittent small-volume bright red blood per rectum. She is accompanied by her husband. Her last colonoscopy was in 05/2012 and small internal hemorrhoids are noted. Her intermittent mild rectal bleeding has not changed significantly. Several months ago she noticed a small lump in her inguinal area. It is nontender and has not changed in size. Denies weight loss, abdominal pain, constipation, diarrhea, change in stool caliber, melena, nausea, vomiting, dysphagia, reflux symptoms, chest pain.  Current Medications, Allergies, Past Medical History, Past Surgical History, Family History and Social History were reviewed in [employer] record.  Physical Exam: General: Well developed , well nourished, no acute distress Head: Normocephalic and atraumatic Eyes:  sclerae anicteric, EOMI Ears: Normal auditory acuity Mouth: No deformity or lesions Lungs: Clear throughout to auscultation Heart: Regular rate and rhythm; no murmurs, rubs or bruits Abdomen: Soft, non tender and non distended. No masses, hepatosplenomegaly or hernias noted. Normal Bowel sounds. Sub 1 cm rounded ovoid shaped lesion in the lateral aspect of her left inguinal area consistent with a lymph node.  Musculoskeletal: Symmetrical with no gross deformities  Pulses:  Normal pulses noted Extremities: No clubbing, cyanosis, edema or deformities noted Neurological: Alert oriented x 4, grossly nonfocal Psychological:  Alert and cooperative. Normal mood and affect  Assessment and Recommendations:  1. Left lateral inguinal lesion most likely a lymph node. I recommended that this be followed by her PCP on serial exams. If the lesion enlarges biopsy should be performed. Will defer plan and follow-up on this to her PCP.  2. Symptomatic internal hemorrhoids. Preparation H suppositories daily as needed.  3.  Personal history of adenomatous colon polyps. Five-year interval surveillance colonoscopy recommended June 2018.

## 2015-07-04 NOTE — Patient Instructions (Signed)
You will be due for a recall colonoscopy in 05/2017. We will send you a reminder in the mail when it gets closer to that time.  Thank you for choosing me and [facility].  Malcolm T. Stark, Jr., MD., FACG  

## 2015-07-12 ENCOUNTER — Other Ambulatory Visit: Payer: Self-pay | Admitting: Cardiology

## 2015-07-12 DIAGNOSIS — F419 Anxiety disorder, unspecified: Secondary | ICD-10-CM

## 2015-07-25 ENCOUNTER — Ambulatory Visit (INDEPENDENT_AMBULATORY_CARE_PROVIDER_SITE_OTHER): Payer: Medicare Other | Admitting: *Deleted

## 2015-07-25 DIAGNOSIS — I495 Sick sinus syndrome: Secondary | ICD-10-CM

## 2015-07-26 NOTE — Progress Notes (Signed)
Remote pacemaker transmission.   

## 2015-07-29 LAB — CUP PACEART REMOTE DEVICE CHECK
Battery Remaining Longevity: 151 mo
Battery Voltage: 2.79 V
Brady Statistic AP VS Percent: 73 %
Lead Channel Impedance Value: 567 Ohm
Lead Channel Impedance Value: 620 Ohm
Lead Channel Pacing Threshold Amplitude: 0.625 V
Lead Channel Pacing Threshold Pulse Width: 0.4 ms
Lead Channel Setting Pacing Amplitude: 2.5 V
Lead Channel Setting Sensing Sensitivity: 5.6 mV
MDC IDC MSMT BATTERY IMPEDANCE: 100 Ohm
MDC IDC MSMT LEADCHNL RA PACING THRESHOLD AMPLITUDE: 0.75 V
MDC IDC MSMT LEADCHNL RA PACING THRESHOLD PULSEWIDTH: 0.4 ms
MDC IDC MSMT LEADCHNL RA SENSING INTR AMPL: 2.8 mV
MDC IDC MSMT LEADCHNL RV SENSING INTR AMPL: 16 mV
MDC IDC SESS DTM: 20160822131614
MDC IDC SET LEADCHNL RA PACING AMPLITUDE: 2 V
MDC IDC SET LEADCHNL RV PACING PULSEWIDTH: 0.4 ms
MDC IDC STAT BRADY AP VP PERCENT: 0 %
MDC IDC STAT BRADY AS VP PERCENT: 0 %
MDC IDC STAT BRADY AS VS PERCENT: 27 %

## 2015-08-04 ENCOUNTER — Encounter: Payer: Self-pay | Admitting: Cardiology

## 2015-08-16 ENCOUNTER — Encounter: Payer: Self-pay | Admitting: Internal Medicine

## 2015-08-29 ENCOUNTER — Telehealth: Payer: Self-pay | Admitting: Family Medicine

## 2015-08-29 NOTE — Telephone Encounter (Signed)
Pt's spouse wanted to know if it would be possible for pt to receive pneumonia vaccine at her next appt 09/05/2015. The best call back number for Cami is her cell, [phone]. Thank you.

## 2015-08-30 NOTE — Telephone Encounter (Signed)
Potentially yes.  Remind me at the [place].  Thanks.

## 2015-08-30 NOTE — Telephone Encounter (Signed)
Please advise 

## 2015-09-05 ENCOUNTER — Ambulatory Visit (INDEPENDENT_AMBULATORY_CARE_PROVIDER_SITE_OTHER): Payer: Medicare Other | Admitting: Family Medicine

## 2015-09-05 ENCOUNTER — Encounter: Payer: Self-pay | Admitting: Family Medicine

## 2015-09-05 VITALS — BP 144/74 | HR 72 | Temp 98.5°F | Wt 127.0 lb

## 2015-09-05 DIAGNOSIS — Z23 Encounter for immunization: Secondary | ICD-10-CM

## 2015-09-05 DIAGNOSIS — L905 Scar conditions and fibrosis of skin: Secondary | ICD-10-CM | POA: Diagnosis not present

## 2015-09-05 NOTE — Patient Instructions (Signed)
Likely scar tissue.  If you notice changes, then let me know.  Take care.  Glad to see you.

## 2015-09-05 NOTE — Progress Notes (Signed)
Pre visit review using our clinic review tool, if applicable. No additional management support is needed unless otherwise documented below in the visit note.  BP was controlled at home so she didn't start taking lisinopril.  She did continue diltiazem.     Months ago, she noted a spot in the L groin.  It was after her knee surgery, about 6 months ago, not changed in size in the meantime.  She has h/o hernia repair years ago.  She had seen Dr. Fuller Plan about the area, was told it was a possible enlarged lymph node.  Was advised if not resolved then go get checked at PCP clinic.  She feels okay except for being tired/worried about her mother who recently had a stroke.  She doesn't have FCNVAD.  No rash.  No drainage from the spot.   H/o mult abd surgeries.    Meds, vitals, and allergies reviewed.   ROS: See HPI.  Otherwise, noncontributory.  nad abd exam soft, not ttp but tough/fibrotic vertically oriented band of tissue noted in the L groin area- that is the lesion in question.  Not ttp, doesn't change in size with cough.  Feels deep to the skin.

## 2015-09-06 DIAGNOSIS — L905 Scar conditions and fibrosis of skin: Secondary | ICD-10-CM | POA: Insufficient documentation

## 2015-09-06 NOTE — Assessment & Plan Note (Signed)
This doesn't feel like a true mass, an lymph node, a cyst, or a lipoma.  More likely scar tissue. Hasn't changed in months, would only observe.  She agrees.  F/u prn.

## 2015-10-14 ENCOUNTER — Other Ambulatory Visit: Payer: Medicare Other

## 2015-10-17 ENCOUNTER — Encounter: Payer: Self-pay | Admitting: Cardiology

## 2015-10-18 ENCOUNTER — Ambulatory Visit: Payer: Medicare Other | Admitting: Cardiology

## 2015-10-26 ENCOUNTER — Telehealth: Payer: Self-pay | Admitting: Cardiology

## 2015-10-26 ENCOUNTER — Ambulatory Visit (INDEPENDENT_AMBULATORY_CARE_PROVIDER_SITE_OTHER): Payer: Medicare Other | Admitting: *Deleted

## 2015-10-26 DIAGNOSIS — I495 Sick sinus syndrome: Secondary | ICD-10-CM

## 2015-10-26 NOTE — Telephone Encounter (Signed)
LMOVM reminding pt to send remote transmission.   

## 2015-10-31 ENCOUNTER — Other Ambulatory Visit: Payer: Medicare Other

## 2015-10-31 NOTE — Progress Notes (Signed)
Remote pacemaker transmission.   

## 2015-11-03 ENCOUNTER — Ambulatory Visit: Payer: Medicare Other | Admitting: Cardiology

## 2015-11-07 ENCOUNTER — Other Ambulatory Visit: Payer: Self-pay | Admitting: Cardiology

## 2015-11-08 ENCOUNTER — Encounter: Payer: Self-pay | Admitting: Cardiology

## 2015-11-08 LAB — CUP PACEART REMOTE DEVICE CHECK
Battery Impedance: 100 Ohm
Battery Remaining Longevity: 150 mo
Battery Voltage: 2.79 V
Brady Statistic AP VP Percent: 0 %
Brady Statistic AS VS Percent: 26 %
Date Time Interrogation Session: 20161123194130
Implantable Lead Implant Date: 20060620
Implantable Lead Location: 753860
Implantable Lead Model: 5076
Lead Channel Pacing Threshold Amplitude: 0.75 V
Lead Channel Pacing Threshold Pulse Width: 0.4 ms
Lead Channel Sensing Intrinsic Amplitude: 16 mV
Lead Channel Sensing Intrinsic Amplitude: 2.8 mV
Lead Channel Setting Pacing Amplitude: 2.5 V
MDC IDC LEAD IMPLANT DT: 20060620
MDC IDC LEAD LOCATION: 753859
MDC IDC MSMT LEADCHNL RA IMPEDANCE VALUE: 549 Ohm
MDC IDC MSMT LEADCHNL RV IMPEDANCE VALUE: 595 Ohm
MDC IDC MSMT LEADCHNL RV PACING THRESHOLD AMPLITUDE: 0.625 V
MDC IDC MSMT LEADCHNL RV PACING THRESHOLD PULSEWIDTH: 0.4 ms
MDC IDC SET LEADCHNL RA PACING AMPLITUDE: 2 V
MDC IDC SET LEADCHNL RV PACING PULSEWIDTH: 0.4 ms
MDC IDC SET LEADCHNL RV SENSING SENSITIVITY: 5.6 mV
MDC IDC STAT BRADY AP VS PERCENT: 74 %
MDC IDC STAT BRADY AS VP PERCENT: 0 %

## 2015-11-21 ENCOUNTER — Telehealth: Payer: Self-pay | Admitting: Internal Medicine

## 2015-11-21 NOTE — Telephone Encounter (Signed)
Returned pt's call. She will be wearing an apnea monitor tonight and she wanted to know its potential affects on her pacemaker. I told her that I doubted there were any interaction between the monitor and pacemaker but if she wanted to know with [id number] certainty she could call [employer]. She is agreeable.

## 2015-11-21 NOTE — Telephone Encounter (Signed)
New message      Pt has a pacemaker.  She want to know if the way she checks her sleep apnea will affect her pacemaker.

## 2015-12-02 ENCOUNTER — Encounter: Payer: Self-pay | Admitting: Gynecology

## 2015-12-02 ENCOUNTER — Ambulatory Visit (INDEPENDENT_AMBULATORY_CARE_PROVIDER_SITE_OTHER): Payer: Medicare Other | Admitting: Gynecology

## 2015-12-02 VITALS — BP 148/90 | Ht 64.5 in | Wt 126.4 lb

## 2015-12-02 DIAGNOSIS — N3941 Urge incontinence: Secondary | ICD-10-CM | POA: Diagnosis not present

## 2015-12-02 DIAGNOSIS — N760 Acute vaginitis: Secondary | ICD-10-CM

## 2015-12-02 DIAGNOSIS — Z01419 Encounter for gynecological examination (general) (routine) without abnormal findings: Secondary | ICD-10-CM

## 2015-12-02 DIAGNOSIS — M8580 Other specified disorders of bone density and structure, unspecified site: Secondary | ICD-10-CM | POA: Diagnosis not present

## 2015-12-02 DIAGNOSIS — N952 Postmenopausal atrophic vaginitis: Secondary | ICD-10-CM | POA: Diagnosis not present

## 2015-12-02 MED ORDER — TERCONAZOLE 0.4 % VA CREA: 1.0000 | TOPICAL_CREAM | Freq: Every day | VAGINAL | Status: DC

## 2015-12-02 NOTE — Patient Instructions (Signed)
Recheck your blood pressure at home and follow up with your primary physician if it remains elevated.  You may obtain a copy of any labs that were done today by logging onto MyChart as outlined in the instructions provided with your AVS (after visit summary). The office will not call with normal lab results but certainly if there are any significant abnormalities then we will contact you.   Health Maintenance Adopting a healthy lifestyle and getting preventive care can go a long way to promote health and wellness. Talk with your health care provider about what schedule of regular examinations is right for you. This is a good chance for you to check in with your provider about disease prevention and staying healthy. In between checkups, there are plenty of things you can do on your own. Experts have done a lot of research about which lifestyle changes and preventive measures are most likely to keep you healthy. Ask your health care provider for more information. WEIGHT AND DIET  Eat a healthy diet  Be sure to include plenty of vegetables, fruits, low-fat dairy products, and lean protein.  Do not eat a lot of foods high in solid fats, added sugars, or salt.  Get regular exercise. This is one of the most important things you can do for your health.  Most adults should exercise for at least 150 minutes each week. The exercise should increase your heart rate and make you sweat (moderate-intensity exercise).  Most adults should also do strengthening exercises at least twice a week. This is in addition to the moderate-intensity exercise.  Maintain a healthy weight  Body mass index (BMI) is a measurement that can be used to identify possible weight problems. It estimates body fat based on height and weight. Your health care provider can help determine your BMI and help you achieve or maintain a healthy weight.  For females 109 years of age and older:   A BMI below 18.5 is considered  underweight.  A BMI of 18.5 to 24.9 is normal.  A BMI of 25 to 29.9 is considered overweight.  A BMI of 30 and above is considered obese.  Watch levels of cholesterol and blood lipids  You should start having your blood tested for lipids and cholesterol at 68 years of age, then have this test every 5 years.  You may need to have your cholesterol levels checked more often if:  Your lipid or cholesterol levels are high.  You are older than 68 years of age.  You are at high risk for heart disease.  CANCER SCREENING   Lung Cancer  Lung cancer screening is recommended for adults 23-42 years old who are at high risk for lung cancer because of a history of smoking.  A yearly low-dose CT scan of the lungs is recommended for people who:  Currently smoke.  Have quit within the past 15 years.  Have at least a 30-pack-year history of smoking. A pack year is smoking an average of one pack of cigarettes a day for 1 year.  Yearly screening should continue until it has been 15 years since you quit.  Yearly screening should stop if you develop a health problem that would prevent you from having lung cancer treatment.  Breast Cancer  Practice breast self-awareness. This means understanding how your breasts normally appear and feel.  It also means doing regular breast self-exams. Let your health care provider know about any changes, no matter how small.  If you are in your  87s or 44s, you should have a clinical breast exam (CBE) by a health care provider every 1-3 years as part of a regular health exam.  If you are 83 or older, have a CBE every year. Also consider having a breast X-ray (mammogram) every year.  If you have a family history of breast cancer, talk to your health care provider about genetic screening.  If you are at high risk for breast cancer, talk to your health care provider about having an MRI and a mammogram every year.  Breast cancer gene (BRCA) assessment is  recommended for women who have family members with BRCA-related cancers. BRCA-related cancers include:  Breast.  Ovarian.  Tubal.  Peritoneal cancers.  Results of the assessment will determine the need for genetic counseling and BRCA1 and BRCA2 testing. Cervical Cancer Routine pelvic examinations to screen for cervical cancer are no longer recommended for nonpregnant women who are considered low risk for cancer of the pelvic organs (ovaries, uterus, and vagina) and who do not have symptoms. A pelvic examination may be necessary if you have symptoms including those associated with pelvic infections. Ask your health care provider if a screening pelvic exam is right for you.   The Pap test is the screening test for cervical cancer for women who are considered at risk.  If you had a hysterectomy for a problem that was not cancer or a condition that could lead to cancer, then you no longer need Pap tests.  If you are older than 65 years, and you have had normal Pap tests for the past 10 years, you no longer need to have Pap tests.  If you have had past treatment for cervical cancer or a condition that could lead to cancer, you need Pap tests and screening for cancer for at least 20 years after your treatment.  If you no longer get a Pap test, assess your risk factors if they change (such as having a new sexual partner). This can affect whether you should start being screened again.  Some women have medical problems that increase their chance of getting cervical cancer. If this is the case for you, your health care provider may recommend more frequent screening and Pap tests.  The human papillomavirus (HPV) test is another test that may be used for cervical cancer screening. The HPV test looks for the virus that can cause cell changes in the cervix. The cells collected during the Pap test can be tested for HPV.  The HPV test can be used to screen women 10 years of age and older. Getting tested  for HPV can extend the interval between normal Pap tests from three to five years.  An HPV test also should be used to screen women of any age who have unclear Pap test results.  After 68 years of age, women should have HPV testing as often as Pap tests.  Colorectal Cancer  This type of cancer can be detected and often prevented.  Routine colorectal cancer screening usually begins at 68 years of age and continues through 68 years of age.  Your health care provider may recommend screening at an earlier age if you have risk factors for colon cancer.  Your health care provider may also recommend using home test kits to check for hidden blood in the stool.  A small camera at the end of a tube can be used to examine your colon directly (sigmoidoscopy or colonoscopy). This is done to check for the earliest forms of colorectal  cancer.  Routine screening usually begins at age 49.  Direct examination of the colon should be repeated every 5-10 years through 68 years of age. However, you may need to be screened more often if early forms of precancerous polyps or small growths are found. Skin Cancer  Check your skin from head to toe regularly.  Tell your health care provider about any new moles or changes in moles, especially if there is a change in a mole's shape or color.  Also tell your health care provider if you have a mole that is larger than the size of a pencil eraser.  Always use sunscreen. Apply sunscreen liberally and repeatedly throughout the day.  Protect yourself by wearing long sleeves, pants, a wide-brimmed hat, and sunglasses whenever you are outside. HEART DISEASE, DIABETES, AND HIGH BLOOD PRESSURE   Have your blood pressure checked at least every 1-2 years. High blood pressure causes heart disease and increases the risk of stroke.  If you are between 83 years and 81 years old, ask your health care provider if you should take aspirin to prevent strokes.  Have regular  diabetes screenings. This involves taking a blood sample to check your fasting blood sugar level.  If you are at a normal weight and have a low risk for diabetes, have this test once every three years after 68 years of age.  If you are overweight and have a high risk for diabetes, consider being tested at a younger age or more often. PREVENTING INFECTION  Hepatitis B  If you have a higher risk for hepatitis B, you should be screened for this virus. You are considered at high risk for hepatitis B if:  You were born in a country where hepatitis B is common. Ask your health care provider which countries are considered high risk.  Your parents were born in a high-risk country, and you have not been immunized against hepatitis B (hepatitis B vaccine).  You have HIV or AIDS.  You use needles to inject street drugs.  You live with someone who has hepatitis B.  You have had sex with someone who has hepatitis B.  You get hemodialysis treatment.  You take certain medicines for conditions, including cancer, organ transplantation, and autoimmune conditions. Hepatitis C  Blood testing is recommended for:  Everyone born from [birth year] through 1964.  Anyone with known risk factors for hepatitis C. Sexually transmitted infections (STIs)  You should be screened for sexually transmitted infections (STIs) including gonorrhea and chlamydia if:  You are sexually active and are younger than 68 years of age.  You are older than 68 years of age and your health care provider tells you that you are at risk for this type of infection.  Your sexual activity has changed since you were last screened and you are at an increased risk for chlamydia or gonorrhea. Ask your health care provider if you are at risk.  If you do not have HIV, but are at risk, it may be recommended that you take a prescription medicine daily to prevent HIV infection. This is called pre-exposure prophylaxis (PrEP). You are considered at  risk if:  You are sexually active and do not regularly use condoms or know the HIV status of your partner(s).  You take drugs by injection.  You are sexually active with a partner who has HIV. Talk with your health care provider about whether you are at high risk of being infected with HIV. If you choose to begin PrEP, you  should first be tested for HIV. You should then be tested every 3 months for as long as you are taking PrEP.  PREGNANCY   If you are premenopausal and you may become pregnant, ask your health care provider about preconception counseling.  If you may become pregnant, take 400 to 800 micrograms (mcg) of folic acid every day.  If you want to prevent pregnancy, talk to your health care provider about birth control (contraception). OSTEOPOROSIS AND MENOPAUSE   Osteoporosis is a disease in which the bones lose minerals and strength with aging. This can result in serious bone fractures. Your risk for osteoporosis can be identified using a bone density scan.  If you are 30 years of age or older, or if you are at risk for osteoporosis and fractures, ask your health care provider if you should be screened.  Ask your health care provider whether you should take a calcium or vitamin D supplement to lower your risk for osteoporosis.  Menopause may have certain physical symptoms and risks.  Hormone replacement therapy may reduce some of these symptoms and risks. Talk to your health care provider about whether hormone replacement therapy is right for you.  HOME CARE INSTRUCTIONS   Schedule regular health, dental, and eye exams.  Stay current with your immunizations.   Do not use any tobacco products including cigarettes, chewing tobacco, or electronic cigarettes.  If you are pregnant, do not drink alcohol.  If you are breastfeeding, limit how much and how often you drink alcohol.  Limit alcohol intake to no more than 1 drink per day for nonpregnant women. One drink equals  12 ounces of beer, 5 ounces of wine, or 1 ounces of hard liquor.  Do not use street drugs.  Do not share needles.  Ask your health care provider for help if you need support or information about quitting drugs.  Tell your health care provider if you often feel depressed.  Tell your health care provider if you have ever been abused or do not feel safe at home. Document Released: 06/04/2011 Document Revised: 04/05/2014 Document Reviewed: 10/21/2013 [facility] Patient Information 2015 [place], [place]. This information is not intended to replace advice given to you by your health care provider. Make sure you discuss any questions you have with your health care provider.

## 2015-12-02 NOTE — Progress Notes (Signed)
[name] December 01, 1946 [id number]        69 y.o.  G2P1011  for breast and pelvic exam. Several issues noted below.  Past medical history,surgical history, problem list, medications, allergies, family history and social history were all reviewed and documented as reviewed in the EPIC chart.  ROS:  Performed with pertinent positives and negatives included in the history, assessment and plan.   Additional significant findings :  none   Exam: Programmer, multimedia Vitals:   12/02/15 1518 12/02/15 1523  BP: 150/90 148/90  Height: 5' 4.5" (1.638 m)   Weight: 126 lb 6.4 oz (57.335 kg)    General appearance:  Normal affect, orientation and appearance. Skin: Grossly normal HEENT: Without gross lesions.  No cervical or supraclavicular adenopathy. Thyroid normal.  Lungs:  Clear without wheezing, rales or rhonchi Cardiac: RR, without RMG Abdominal:  Soft, nontender, without masses, guarding, rebound, organomegaly or hernia Breasts:  Examined lying and sitting without masses, retractions, discharge or axillary adenopathy. Pelvic:  Ext/BUS/vagina with atrophic changes  Adnexa  Without masses or tenderness    Anus and perineum  Normal   Rectovaginal  Normal sphincter tone without palpated masses or tenderness.    Assessment/Plan:  69 y.o. G67P1011 female for breast and pelvic exam.  1. Postmenopausal/atrophic genital changes.  Status post TAH/BSO in the past. Doing well without significant hot flushes, night sweats or vaginal dryness. Follow up if any issues. 2. Urge incontinence. Patient continues to have some issues with urge incontinence particularly when her bladder is full. We discussed this last year and options for OAB medication trial discussed. Patient's fearful given the number of other medication she is on and does not want to do so. I did discuss behavior modification to include making sure her bladder was emptied on a regular basis to avoid a full bladder. Also avoidance of  caffeine and other bladder irritants discussed. Check urinalysis today. 3. Osteopenia. DEXA 12/2012 T score -1.7 FRAX 8.5%/1.1%. Stable from prior DEXA 2007. Increased calcium vitamin D. Plan repeat DEXA in another year or 2. 4. Pap smear 2012. No Pap smear done today. No history of significant abnormal Pap smears. We reviewed current screening guidelines and both are comfortable with stop screening based on age and prior hysterectomy history. 5. Colonoscopy 2013. Repeat at their recommended interval. 6. Occasional intermittent vaginitis. Patient does note vaginal irritation at times. She has used Terazol cream applied externally for years and this seems to work well. One tube with 2 refills provided. 7. Mammography 03/2015. Continue with annual mammography when due. SBE monthly reviewed. 8. Health maintenance. Blood pressure 150/90. Repeated  148/90. Is being followed for hypertension. Is able to follow her blood pressures at home. Recommended she monitor at home and if remains elevated she will follow up with her primary physician for further management.  No routine blood work done as this is done at her primary physician's office. Follow up in one year, sooner as needed.   Anastasio Auerbach MD, 4:11 PM 12/02/2015

## 2015-12-03 LAB — URINALYSIS W MICROSCOPIC + REFLEX CULTURE
Bacteria, UA: NONE SEEN [HPF]
Bilirubin Urine: NEGATIVE
Casts: NONE SEEN [LPF]
Crystals: NONE SEEN [HPF]
Glucose, UA: NEGATIVE
HGB URINE DIPSTICK: NEGATIVE
KETONES UR: NEGATIVE
Leukocytes, UA: NEGATIVE
Nitrite: NEGATIVE
Protein, ur: NEGATIVE
RBC / HPF: NONE SEEN RBC/HPF (ref <=2)
Specific Gravity, Urine: 1.021 (ref 1.001–1.035)
Squamous Epithelial / LPF: NONE SEEN [HPF] (ref <=5)
WBC, UA: NONE SEEN WBC/HPF (ref <=5)
Yeast: NONE SEEN [HPF]
pH: 5.5 (ref 5.0–8.0)

## 2015-12-13 ENCOUNTER — Telehealth: Payer: Self-pay | Admitting: Cardiology

## 2015-12-13 NOTE — Telephone Encounter (Signed)
New Message  Pt req to have a lab testing for Hep C

## 2015-12-13 NOTE — Telephone Encounter (Signed)
Patient does not think she has it but keeps seeing on TV that people her age should get checked Having lab work next week and wanted to know if this could be added  Will forward to  Dr. Mare Ferrari for review

## 2015-12-13 NOTE — Telephone Encounter (Signed)
Advised patient

## 2015-12-13 NOTE — Telephone Encounter (Signed)
She should get that type of blood tests from her PCP.

## 2015-12-19 ENCOUNTER — Other Ambulatory Visit: Payer: Medicare Other | Admitting: *Deleted

## 2015-12-19 DIAGNOSIS — I119 Hypertensive heart disease without heart failure: Secondary | ICD-10-CM

## 2015-12-19 DIAGNOSIS — I495 Sick sinus syndrome: Secondary | ICD-10-CM

## 2015-12-19 DIAGNOSIS — E7800 Pure hypercholesterolemia, unspecified: Secondary | ICD-10-CM

## 2015-12-19 DIAGNOSIS — K648 Other hemorrhoids: Secondary | ICD-10-CM

## 2015-12-19 LAB — HEPATIC FUNCTION PANEL
ALK PHOS: 45 U/L (ref 33–130)
ALT: 14 U/L (ref 6–29)
AST: 19 U/L (ref 10–35)
Albumin: 4.3 g/dL (ref 3.6–5.1)
BILIRUBIN INDIRECT: 0.4 mg/dL (ref 0.2–1.2)
Bilirubin, Direct: 0.1 mg/dL (ref <=0.2)
TOTAL PROTEIN: 7 g/dL (ref 6.1–8.1)
Total Bilirubin: 0.5 mg/dL (ref 0.2–1.2)

## 2015-12-19 LAB — BASIC METABOLIC PANEL
BUN: 16 mg/dL (ref 7–25)
CO2: 29 mmol/L (ref 20–31)
CREATININE: 0.86 mg/dL (ref 0.50–0.99)
Calcium: 9.4 mg/dL (ref 8.6–10.4)
Chloride: 100 mmol/L (ref 98–110)
Glucose, Bld: 88 mg/dL (ref 65–99)
Potassium: 3.8 mmol/L (ref 3.5–5.3)
Sodium: 137 mmol/L (ref 135–146)

## 2015-12-19 LAB — LIPID PANEL
CHOLESTEROL: 206 mg/dL — AB (ref 125–200)
HDL: 65 mg/dL (ref >=46)
LDL Cholesterol: 123 mg/dL (ref <130)
TRIGLYCERIDES: 88 mg/dL (ref <150)
Total CHOL/HDL Ratio: 3.2 Ratio (ref <=5.0)
VLDL: 18 mg/dL (ref <30)

## 2015-12-20 NOTE — Progress Notes (Signed)
Quick Note:  Please make copy of labs for patient visit. ______ 

## 2015-12-22 ENCOUNTER — Ambulatory Visit (INDEPENDENT_AMBULATORY_CARE_PROVIDER_SITE_OTHER): Payer: Medicare Other | Admitting: Cardiology

## 2015-12-22 ENCOUNTER — Encounter: Payer: Self-pay | Admitting: Cardiology

## 2015-12-22 VITALS — BP 140/82 | HR 68 | Ht 64.5 in | Wt 125.0 lb

## 2015-12-22 DIAGNOSIS — I495 Sick sinus syndrome: Secondary | ICD-10-CM | POA: Diagnosis not present

## 2015-12-22 DIAGNOSIS — R002 Palpitations: Secondary | ICD-10-CM | POA: Diagnosis not present

## 2015-12-22 DIAGNOSIS — I119 Hypertensive heart disease without heart failure: Secondary | ICD-10-CM | POA: Diagnosis not present

## 2015-12-22 DIAGNOSIS — E7800 Pure hypercholesterolemia, unspecified: Secondary | ICD-10-CM

## 2015-12-22 NOTE — Patient Instructions (Signed)
Medication Instructions:  Your physician recommends that you continue on your current medications as directed. Please refer to the Current Medication list given to you today.  Labwork: none  Testing/Procedures: none  Follow-Up: Your physician recommends that you schedule a follow-up appointment in: 4 months ov Dr Angelena Form  If you need a refill on your cardiac medications before your next appointment, please call your pharmacy.

## 2015-12-22 NOTE — Progress Notes (Signed)
Cardiology Office Note   Date:  12/22/2015   ID:  [name], DOB 07/21/1946, MRN [id number]  PCP:  Elsie Stain, MD  Cardiologist: Darlin Coco MD  Chief Complaint  Patient presents with  . routine office visit    Patient denies chest pain, shortness of breath, le edema, or claudication      History of Present Illness: [name] is a 70 y.o. female who presents for four-month follow-up office visit. she has a past history of essential hypertension and a history of symptomatic palpitations. She also has a functioning dual-chamber pacemaker implanted by Dr. Verlon Setting in about 2006. The patient has a history of atypical chest pain. She does not have any history of known ischemic heart disease. She does have a history of gastroesophageal reflux disease. She underwent upper endoscopy and colonoscopy in June 2014 by her gastroenterologist Dr. Fuller Plan. A few colonic polyps were found and she will need another colonoscopy in 5 years.  On 12/20/2014 patient underwent right knee surgery by Dr. Ronnie Derby with a total knee replacement. Several weeks later she had a problem with palpitations and her pacemaker was interrogated and found to be at end-of-life. The patient underwent placement of a new Medtronic generator on February 19,2016. She did well with the procedure. She is now followed by Dr. Rayann Heman. She saw Dr. Ronnie Derby recently about some popping sensation in her right knee.this occurs when she rides the exercise bike.  She was advised to try walking instead and see if the popping of the knee continues. Patient has not been having any exertional chest pain.  No dizziness or syncope.  Blood pressure has been stable on current medications.  Past Medical History  Diagnosis Date  . Adenomatous colon polyp 12/2006  . Hemorrhoids   . Inguinal hernia   . Hyperlipidemia   . Sleep apnea     can't use c-pap  . Allergic rhinitis   . GERD (gastroesophageal reflux disease)   . Bradycardia    s/p MDT PPM  . Atypical chest pain   . Hypertension   . Pacemaker   . Skin cancer     Basal and squamous cell  . Anemia   . Arthritis   . Blood in stool   . History of chicken pox   . Depression   . Cardiac arrhythmia due to congenital heart disease   . Thyroid disease   . UTI (urinary tract infection)   . Osteopenia 12/2012    T score -1.7 FRAX 8.5%/1.1%  . Eye problem     Vitrious detachment  . PONV (postoperative nausea and vomiting)   . Family history of adverse reaction to anesthesia     MALIGNANT HYPERTHERMIA IN PATIENT'S UNCLE AND NEPHEW  . 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 Cough     RECENT COLD   PT TX WITH ANTIBIOTIC  . Headache     MIGRAINES  . Dizziness   . History of hiatal hernia   . Frequent urination at night   . Scleritis of right eye   . Vitreous detachment of both eyes   . Heart murmur     reports h/o MVP  . Esophageal stricture     Past Surgical History  Procedure Laterality Date  . Thyroidectomy  1979    RIGHT SIDE  . Polypectomy    . Pacemaker insertion  2006    Dr. Verlon Setting  . Abdominal hysterectomy    .  Knee arthroscopy  2011  . Cesarean section    . Dilation and curettage of uterus    . Thumb surgery    . Oophorectomy  1995    LSO and RSO  . Tear duct surgery  2010  . Hand surgery  1992  . Inguinal hernia repair  02/12/1994    left inguinal hernia repair  . Hernia repair    . Breast surgery      Biopsy-benign  . Total knee arthroplasty Right 12/20/2014    Procedure: RIGHT TOTAL KNEE ARTHROPLASTY;  Surgeon: Vickey Huger, MD;  Location: [facility];  Service: Orthopedics;  Laterality: Right;  . Pacemaker generator change N/A 01/21/2015    Procedure: PACEMAKER GENERATOR CHANGE;  Surgeon: [name], MD;  Location: [facility] CATH LAB;  Service: Cardiovascular;  Laterality: N/A;     Current Outpatient Prescriptions  Medication Sig Dispense Refill  . acetaminophen (TYLENOL) 500 MG tablet Take 1,000 mg by mouth  every 6 (six) hours as needed (pain).    . Carboxymethylcell-Hypromellose (GENTEAL) 0.25-0.3 % GEL Place 1 application into both eyes at bedtime.    Marland Kitchen diltiazem (CARDIZEM CD) 120 MG 24 hr capsule Take 1 capsule (120 mg total) by mouth daily. 180 capsule 3  . ibuprofen (ADVIL,MOTRIN) 200 MG tablet Take 200 mg by mouth every 6 (six) hours as needed for moderate pain.    Marland Kitchen lisinopril (PRINIVIL,ZESTRIL) 5 MG tablet Take 5 mg by mouth daily.     Marland Kitchen LORazepam (ATIVAN) 1 MG tablet TAKE 1 TABLET BY MOUTH 4 TIMES A DAY 120 tablet 5  . nitroGLYCERIN (NITROSTAT) 0.4 MG SL tablet Place 1 tablet (0.4 mg total) under the tongue every 5 (five) minutes as needed. For up to 3 doses (Patient taking differently: Place 0.4 mg under the tongue every 5 (five) minutes as needed for chest pain. For up to 3 doses) 25 tablet 4  . nizatidine (AXID) 150 MG capsule TAKE (1) CAPSULE TWICE DAILY. 60 capsule 3  . Polyethyl Glycol-Propyl Glycol (SYSTANE OP) Place 1 drop into both eyes daily as needed (dry eyes).    Marland Kitchen terconazole (TERAZOL 7) 0.4 % vaginal cream Place 1 applicator vaginally at bedtime. For 7 nights 45 g 3   No current facility-administered medications for this visit.    Allergies:   Hydrocodone; Antihistamines, loratadine-type; Bisoprolol-hydrochlorothiazide; Bystolic; Cephalexin; Codeine; Demerol; Epinephrine; Erythromycin; Hyoscyamine sulfate; Inderal; Lidocaine; Lipitor; Losartan potassium; Metoprolol; Morphine; Nebivolol; Oxycodone-acetaminophen; Paxil; Penicillins; Prednisone; Propranolol hcl; Sertraline hcl; Tetracycline; Toprol xl; Verapamil; and Hydralazine    Social History:  The patient  reports that she has never smoked. She has never used smokeless tobacco. She reports that she does not drink alcohol or use illicit drugs.   Family History:  The patient's family history includes Arthritis in her father; Breast cancer in her maternal aunt; Dementia in her mother; Diabetes in her maternal aunt and maternal  uncle; Heart disease in her sister; Heart failure in her sister; Hypertension in her father, mother, and sister; Ovarian cancer in her maternal aunt. There is no history of Colon cancer.    ROS:  Please see the history of present illness.   Otherwise, review of systems are positive for none.   All other systems are reviewed and negative.    PHYSICAL EXAM: VS:  BP 140/82 mmHg  Pulse 68  Ht 5' 4.5" (1.638 m)  Wt 125 lb (56.7 kg)  BMI 21.13 kg/m2 , BMI Body mass index is 21.13 kg/(m^2). GEN: Well nourished, well developed, in no  acute distress HEENT: normal Neck: no JVD, carotid bruits, or masses Cardiac: RRR; no murmurs, rubs, or gallops,no edema  Respiratory:  clear to auscultation bilaterally, normal work of breathing GI: soft, nontender, nondistended, + BS MS: no deformity or atrophy Skin: warm and dry, no rash Neuro:  Strength and sensation are intact Psych: euthymic mood, full affect   EKG:  EKG is ordered today. The ekg ordered today demonstrates electronic atrial pacemaker.  Left anterior hemiblock.  No ischemic changes.  Since last tracing of 06/14/15, no significant change   Recent Labs: 01/13/2015: Hemoglobin 13.0; Magnesium 2.3; Platelets 246 12/19/2015: ALT 14; BUN 16; Creat 0.86; Potassium 3.8; Sodium 137    Lipid Panel    Component Value Date/Time   CHOL 206* 12/19/2015 0920   TRIG 88 12/19/2015 0920   HDL 65 12/19/2015 0920   CHOLHDL 3.2 12/19/2015 0920   VLDL 18 12/19/2015 0920   LDLCALC 123 12/19/2015 0920   LDLDIRECT 146.6 01/25/2014 0953      Wt Readings from Last 3 Encounters:  12/22/15 125 lb (56.7 kg)  12/02/15 126 lb 6.4 oz (57.335 kg)  09/05/15 127 lb (57.607 kg)         ASSESSMENT AND PLAN:  1. benign hypertensive heart disease without heart failure 2. Hyperlipidemia 3. sleep apnea.she is unable to tolerate the traditional CPAP device.  She is using an insert into her mouth created by orthodontist Dr. Ron Parker 4. atypical chest  discomfort,quiesced and 5. sick sinus syndrome with functioning dual-chamber pacemaker. New generator (Medtronic) implanted on 01/21/15. 6. GERD 7. Osteoarthritis. Recent right total knee replacement. Dr. Ronnie Derby.  Current medicines are reviewed at length with the patient today.  The patient does not have concerns regarding medicines.  The following changes have been made:  no change  Labs/ tests ordered today include:   Orders Placed This Encounter  Procedures  . EKG 12-Lead    Disposition: Continue current medication.  Recheck in 4 months for office visit with Dr.Hilty  Signed, Darlin Coco MD 12/22/2015 5:33 PM    [address], [place], [place]  [address] Phone: [phone]; Fax: [phone]

## 2016-01-10 ENCOUNTER — Telehealth: Payer: Self-pay | Admitting: Cardiology

## 2016-01-10 NOTE — Telephone Encounter (Signed)
To PA nurse.

## 2016-01-10 NOTE — Telephone Encounter (Signed)
Pt's husband calling re Ativan needing prior auth through [employer] pls call husband chuck green [phone]

## 2016-01-13 NOTE — Telephone Encounter (Signed)
Follow Up  Husband called back. He is upset. Please call cell # [phone]

## 2016-01-13 NOTE — Telephone Encounter (Signed)
Spoke with husband and explained further refills and PA for Ativan would need to come from her PCP

## 2016-01-13 NOTE — Telephone Encounter (Signed)
Follow up      Pt request to talk to [name] only.  Please call before you leave today

## 2016-01-16 ENCOUNTER — Telehealth: Payer: Self-pay | Admitting: Family Medicine

## 2016-01-16 NOTE — Telephone Encounter (Signed)
Husband called in, wants to let Dr Damita Dunnings know that cardiologist is retiring and new cardiologist will not prescribe some of her meds.  Please call [phone] Thank you

## 2016-01-17 NOTE — Telephone Encounter (Signed)
The medication is Lorazepam.  Husband states the letter says it is on the formulary but requires a PA for further refills.  This was prescribed by Dr. Mare Ferrari because of her anxiety that affected her heart rate.  Dr. Mare Ferrari has always handled this in the past but is now retiring. New Cardiologist will only prescribe meds that are directly related to cards issues.  [employer] ID [id number].  I will do PA as soon as I can.

## 2016-01-17 NOTE — Telephone Encounter (Signed)
Patient just got a refill so it is okay to wait to do this PA at a later date per husband.

## 2016-01-17 NOTE — Telephone Encounter (Signed)
Please see what she is going to need and let me know.  Thanks.

## 2016-01-19 ENCOUNTER — Telehealth: Payer: Self-pay | Admitting: *Deleted

## 2016-01-19 NOTE — Telephone Encounter (Deleted)
Sent PA for Lorazepam thru CMM, awaiting response.

## 2016-01-19 NOTE — Telephone Encounter (Signed)
PA for Lorazepam submitted thru CMM, awaiting response.

## 2016-01-19 NOTE — Telephone Encounter (Signed)
Longstanding h/o anxiety and palpitations controlled with med.   Will that help?

## 2016-01-19 NOTE — Telephone Encounter (Signed)
Other than the fact that the patient has apparently been on this medication for many years, do you know of any other info that I might present to help to get this PA approval?  If so, it might help to avoid doing a paper form.

## 2016-01-19 NOTE — Telephone Encounter (Signed)
Denial Notice received and placed in Dr. Buckner [place].

## 2016-01-20 ENCOUNTER — Encounter: Payer: Self-pay | Admitting: *Deleted

## 2016-01-20 NOTE — Telephone Encounter (Signed)
Letter mailed to patient along with a copy of the Denial Notice.

## 2016-01-20 NOTE — Telephone Encounter (Signed)
She can appeal it but it may end up being cheaper for her to pay cash for the med.  I would have her price check it.   To be clear- this issue is from [employer] and not from me or her pharmacy.  Thanks.

## 2016-01-24 NOTE — Telephone Encounter (Signed)
PA needs to be sent through [employer].   Husband gave - [email or web address]     ID # [id number].  No rush on PA.  I will handle it on Thursday.

## 2016-01-24 NOTE — Telephone Encounter (Signed)
Patient's husband,Chuck, is asking for Lugene to call him back about the PA.  Please call him back at [phone].

## 2016-01-24 NOTE — Telephone Encounter (Signed)
Erroneous encounter

## 2016-01-25 ENCOUNTER — Ambulatory Visit (INDEPENDENT_AMBULATORY_CARE_PROVIDER_SITE_OTHER): Payer: Medicare Other | Admitting: *Deleted

## 2016-01-25 DIAGNOSIS — I495 Sick sinus syndrome: Secondary | ICD-10-CM

## 2016-01-25 NOTE — Progress Notes (Signed)
Remote pacemaker transmission.   

## 2016-02-01 ENCOUNTER — Other Ambulatory Visit: Payer: Self-pay

## 2016-02-01 DIAGNOSIS — Z1231 Encounter for screening mammogram for malignant neoplasm of breast: Secondary | ICD-10-CM

## 2016-02-03 NOTE — Telephone Encounter (Signed)
Submitted through [facility] with favorable response.  Husband advised.

## 2016-02-03 NOTE — Telephone Encounter (Signed)
Mr Mayabb(DPR signed) left v/m requesting cb with status of PA.

## 2016-02-07 ENCOUNTER — Other Ambulatory Visit: Payer: Self-pay

## 2016-02-07 DIAGNOSIS — F419 Anxiety disorder, unspecified: Secondary | ICD-10-CM

## 2016-02-07 MED ORDER — NIZATIDINE 150 MG PO CAPS: ORAL_CAPSULE | ORAL | Status: DC

## 2016-02-07 MED ORDER — LORAZEPAM 1 MG PO TABS: 1.0000 mg | ORAL_TABLET | Freq: Four times a day (QID) | ORAL | Status: DC

## 2016-02-07 NOTE — Telephone Encounter (Signed)
Medication phoned to pharmacy. Husband advised that CPE should be scheduled.

## 2016-02-07 NOTE — Telephone Encounter (Signed)
Nizatidine sent. Please call in lorazepam.  Schedule AMV/CPE when possible.  Thanks.

## 2016-02-07 NOTE — Telephone Encounter (Signed)
[name] (DPR signed) left v/m requesting refill lorazepam and niaztidine (previously refilled by Dr Mare Ferrari; but since retired request PCP fill med); pt last annual wellness exam on 10/11/14; last acute visit 09/05/15. [name] said lorazepam needs to be called in to [facility] and nizatidine needs to be sent to [facility] pharmacy(pt has problems tolerating meds and the nizatidine pt can tolerate is at [facility].

## 2016-02-21 ENCOUNTER — Telehealth: Payer: Self-pay | Admitting: Internal Medicine

## 2016-02-21 LAB — CUP PACEART REMOTE DEVICE CHECK
Battery Impedance: 108 Ohm
Battery Remaining Longevity: 147 mo
Battery Voltage: 2.79 V
Brady Statistic AP VP Percent: 0 %
Brady Statistic AP VS Percent: 75 %
Brady Statistic AS VP Percent: 0 %
Brady Statistic AS VS Percent: 25 %
Date Time Interrogation Session: 20170222161331
Implantable Lead Implant Date: 20060620
Implantable Lead Implant Date: 20060620
Implantable Lead Location: 753859
Implantable Lead Location: 753860
Implantable Lead Model: 5076
Implantable Lead Model: 5076
Lead Channel Impedance Value: 567 Ohm
Lead Channel Impedance Value: 608 Ohm
Lead Channel Pacing Threshold Amplitude: 0.625 V
Lead Channel Pacing Threshold Amplitude: 0.75 V
Lead Channel Pacing Threshold Pulse Width: 0.4 ms
Lead Channel Pacing Threshold Pulse Width: 0.4 ms
Lead Channel Sensing Intrinsic Amplitude: 16 mV
Lead Channel Sensing Intrinsic Amplitude: 2.8 mV
Lead Channel Setting Pacing Amplitude: 2 V
Lead Channel Setting Pacing Amplitude: 2.5 V
Lead Channel Setting Pacing Pulse Width: 0.4 ms
Lead Channel Setting Sensing Sensitivity: 5.6 mV

## 2016-02-21 NOTE — Telephone Encounter (Signed)
Spoke w/ pt and gave her results to device report. Also, scheduled pt for an appt on 5-23 at 1:00 w/ AS. Pt agreed and verbalized understanding.

## 2016-02-21 NOTE — Telephone Encounter (Signed)
New message      Pt never received a letter or note on mychart regarding her feb transmission.  Did we receive it?  Was it ok?  When is her next transmission date?

## 2016-02-22 ENCOUNTER — Encounter: Payer: Self-pay | Admitting: Cardiology

## 2016-02-27 ENCOUNTER — Ambulatory Visit (INDEPENDENT_AMBULATORY_CARE_PROVIDER_SITE_OTHER): Payer: Medicare Other | Admitting: Gastroenterology

## 2016-02-27 ENCOUNTER — Encounter: Payer: Self-pay | Admitting: Gastroenterology

## 2016-02-27 VITALS — BP 140/80 | HR 84 | Ht 63.75 in | Wt 126.0 lb

## 2016-02-27 DIAGNOSIS — K5900 Constipation, unspecified: Secondary | ICD-10-CM

## 2016-02-27 DIAGNOSIS — R1013 Epigastric pain: Secondary | ICD-10-CM

## 2016-02-27 DIAGNOSIS — K219 Gastro-esophageal reflux disease without esophagitis: Secondary | ICD-10-CM

## 2016-02-27 DIAGNOSIS — Z86010 Personal history of colonic polyps: Secondary | ICD-10-CM | POA: Diagnosis not present

## 2016-02-27 NOTE — Patient Instructions (Signed)
You can take over the counter Gas-X four times a day for gas and bloating.  Start over the counter Bene fiber or Citracel clear daily for several weeks. If you see no improvement in your symptoms then start Miralax daily.   Call in one month if your symptoms are no better.   Thank you for choosing me and [facility].  Pricilla Riffle. Dagoberto Ligas., MD., Marval Regal

## 2016-02-27 NOTE — Progress Notes (Signed)
    History of Present Illness: This is a 70 year old female complaining of epigastric pain, belching and constipation. She is accompanied by her husband. She has had intermittent epigastric pain that is occasionally relieved by meals, occasionally associated with belching and occasionally associated with constipation. Her symptoms have been bothersome for approximately 2 months. She states that although she has belching and epigastric her heartburn symptoms are controlled on Axid twice daily. She used to take a fiber supplement for constipation is no longer does so. Denies weight loss, diarrhea, change in stool caliber, melena, hematochezia, nausea, vomiting, dysphagia, chest pain.  Current Medications, Allergies, Past Medical History, Past Surgical History, Family History and Social History were reviewed in [employer] record.  Physical Exam: General: Well developed, well nourished, no acute distress Head: Normocephalic and atraumatic Eyes:  sclerae anicteric, EOMI Ears: Normal auditory acuity Mouth: No deformity or lesions Lungs: Clear throughout to auscultation Heart: Regular rate and rhythm; no murmurs, rubs or bruits Abdomen: Soft, non tender and non distended. No masses, hepatosplenomegaly or hernias noted. Normal Bowel sounds Musculoskeletal: Symmetrical with no gross deformities  Pulses:  Normal pulses noted Extremities: No clubbing, cyanosis, edema or deformities noted Neurological: Alert oriented x 4, grossly nonfocal Psychological:  Alert and cooperative. Normal mood and affect  Assessment and Recommendations:  1. Epigastric pain, constipation and GERD. Intensify antireflux measures. I think the majority of her symptoms are related to constipation and intestinal gas. GERD could be contributing as well. Begin either Benefiber daily or Citrucel clear daily. If this is not effective begin MiraLAX daily. Gas-X 4 times a day when necessary. If symptoms do not  improve within 1 month she is advised to contact my office.   2. GERD with a history of a peptic stricture. Intermittent epigastric pain and belching could be related to inadequate reflux control. She is reluctant to resume a PPI due to potential side effects and is more comfortable continuing Axid 150 mg twice daily despite the fact that her symptoms may not be adequately controlled.  3. Personal history of adenomatous colon polyps. Five-year interval surveillance colonoscopy recommended in June 2018.

## 2016-03-08 ENCOUNTER — Ambulatory Visit: Payer: Medicare Other

## 2016-03-13 ENCOUNTER — Ambulatory Visit
Admission: RE | Admit: 2016-03-13 | Discharge: 2016-03-13 | Disposition: A | Discharge status: Home / Self Care | Payer: Medicare Other | Source: Ambulatory Visit

## 2016-03-13 DIAGNOSIS — Z1231 Encounter for screening mammogram for malignant neoplasm of breast: Secondary | ICD-10-CM

## 2016-03-14 ENCOUNTER — Other Ambulatory Visit: Payer: Self-pay

## 2016-03-14 ENCOUNTER — Other Ambulatory Visit: Payer: Self-pay | Admitting: Family Medicine

## 2016-03-14 DIAGNOSIS — R928 Other abnormal and inconclusive findings on diagnostic imaging of breast: Secondary | ICD-10-CM

## 2016-03-20 ENCOUNTER — Other Ambulatory Visit: Payer: Self-pay | Admitting: Family Medicine

## 2016-03-20 ENCOUNTER — Ambulatory Visit
Admission: RE | Admit: 2016-03-20 | Discharge: 2016-03-20 | Disposition: A | Discharge status: Home / Self Care | Payer: Medicare Other | Source: Ambulatory Visit | Attending: Family Medicine | Admitting: Family Medicine

## 2016-03-20 ENCOUNTER — Other Ambulatory Visit: Payer: Self-pay

## 2016-03-20 DIAGNOSIS — R928 Other abnormal and inconclusive findings on diagnostic imaging of breast: Secondary | ICD-10-CM

## 2016-04-04 ENCOUNTER — Other Ambulatory Visit: Payer: Self-pay

## 2016-04-04 DIAGNOSIS — I119 Hypertensive heart disease without heart failure: Secondary | ICD-10-CM

## 2016-04-04 MED ORDER — DILTIAZEM HCL ER COATED BEADS 120 MG PO CP24: 120.0000 mg | ORAL_CAPSULE | Freq: Every day | ORAL | Status: DC

## 2016-04-05 ENCOUNTER — Encounter: Payer: Self-pay | Admitting: Gastroenterology

## 2016-04-23 ENCOUNTER — Encounter: Payer: Self-pay | Admitting: Nurse Practitioner

## 2016-04-23 NOTE — Progress Notes (Signed)
Electrophysiology Office Note Date: 04/23/2016  ID:  [name], DOB 12/05/1945, MRN [id number]  PCP: Elsie Stain, MD Primary Cardiologist: Mare Ferrari ->[place] Electrophysiologist: [facility]  CC: Pacemaker follow-up  [name] is a 70 y.o. female seen today for Dr Rayann Heman.  She presents today for routine electrophysiology followup.  Since last being seen in our clinic, the patient reports doing very well. She has persistent intermittent palpitations that are short lived but cause anxiety.  She denies chest pain, dyspnea, PND, orthopnea, nausea, vomiting, dizziness, syncope, edema, weight gain, or early satiety.  Device History: MDT dual chamber PPM implanted 2006 for sick sinus syndrome; gen change 2016   Past Medical History  Diagnosis Date  . Adenomatous colon polyp 12/2006  . Hemorrhoids   . Inguinal hernia   . Hyperlipidemia   . Sleep apnea     a. intolerant of CPAP  . Allergic rhinitis   . GERD (gastroesophageal reflux disease)   . Bradycardia     s/p MDT PPM  . Hypertension   . Skin cancer     Basal and squamous cell  . Anemia   . Arthritis   . History of chicken pox   . Depression   . Thyroid disease   . Osteopenia 12/2012    T score -1.7 FRAX 8.5%/1.1%  . History of hiatal hernia   . Scleritis of right eye   . Vitreous detachment of both eyes   . Esophageal stricture    Past Surgical History  Procedure Laterality Date  . Thyroidectomy  1979    RIGHT SIDE  . Polypectomy    . Pacemaker insertion  2006    Dr. Verlon Setting  . Abdominal hysterectomy    . Knee arthroscopy  2011  . Cesarean section    . Dilation and curettage of uterus    . Thumb surgery    . Oophorectomy  1995    LSO and RSO  . Tear duct surgery  2010  . Hand surgery  1992  . Inguinal hernia repair  02/12/1994    left inguinal hernia repair  . Hernia repair    . Breast surgery      Biopsy-benign  . Total knee arthroplasty Right 12/20/2014    Procedure: RIGHT TOTAL KNEE  ARTHROPLASTY;  Surgeon: Vickey Huger, MD;  Location: [facility];  Service: Orthopedics;  Laterality: Right;  . Pacemaker generator change N/A 01/21/2015    Procedure: PACEMAKER GENERATOR CHANGE;  Surgeon: [name], MD;  Location: [facility] CATH LAB;  Service: Cardiovascular;  Laterality: N/A;    Current Outpatient Prescriptions  Medication Sig Dispense Refill  . acetaminophen (TYLENOL) 500 MG tablet Take 1,000 mg by mouth every 6 (six) hours as needed (pain).    . Carboxymethylcell-Hypromellose (GENTEAL) 0.25-0.3 % GEL Place 1 application into both eyes at bedtime.    Marland Kitchen diltiazem (CARDIZEM CD) 120 MG 24 hr capsule Take 1 capsule (120 mg total) by mouth daily. 180 capsule 0  . ibuprofen (ADVIL,MOTRIN) 200 MG tablet Take 200 mg by mouth every 6 (six) hours as needed for moderate pain.    Marland Kitchen LORazepam (ATIVAN) 1 MG tablet Take 1 tablet (1 mg total) by mouth 4 (four) times daily. 120 tablet 2  . nitroGLYCERIN (NITROSTAT) 0.4 MG SL tablet Place 1 tablet (0.4 mg total) under the tongue every 5 (five) minutes as needed. For up to 3 doses (Patient not taking: Reported on 02/27/2016) 25 tablet 4  . nizatidine (AXID) 150 MG capsule TAKE (1) CAPSULE  TWICE DAILY. 60 capsule 2  . Polyethyl Glycol-Propyl Glycol (SYSTANE OP) Place 1 drop into both eyes daily as needed (dry eyes).    Marland Kitchen terconazole (TERAZOL 7) 0.4 % vaginal cream Place 1 applicator vaginally at bedtime. For 7 nights 45 g 3   No current facility-administered medications for this visit.    Allergies:   Hydrocodone; Antihistamines, loratadine-type; Bisoprolol-hydrochlorothiazide; Bystolic; Cephalexin; Codeine; Demerol; Epinephrine; Erythromycin; Hyoscyamine sulfate; Inderal; Lidocaine; Lipitor; Losartan potassium; Metoprolol; Morphine; Nebivolol; Oxycodone-acetaminophen; Paxil; Penicillins; Prednisone; Propranolol hcl; Sertraline hcl; Tetracycline; Toprol xl; Verapamil; and Hydralazine   Social History: Social History   Social History  . Marital Status:  Married    Spouse Name: N/A  . Number of Children: 1  . Years of Education: N/A   Occupational History  . Retired    Social History Main Topics  . Smoking status: Never Smoker   . Smokeless tobacco: Never Used  . Alcohol Use: No  . Drug Use: No  . Sexual Activity: Not Currently    Birth Control/ Protection: Surgical, Post-menopausal     Comment: HYST   Other Topics Concern  . 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 Colon cancer Neg Hx   . Heart failure Sister   . Heart disease Sister   . Hypertension Sister   . Dementia Mother   . Hypertension Mother   . Hypertension Father   . Arthritis Father   . Breast cancer Maternal Aunt     Age 68's  . Diabetes Maternal Aunt   . Diabetes Maternal Uncle   . Ovarian cancer Maternal Aunt      Review of Systems: All other systems reviewed and are otherwise negative except as noted above.   Physical Exam: VS:  There were no vitals taken for this visit. , BMI There is no weight on file to calculate BMI.  GEN- The patient is elderly appearing, alert and oriented x 3 today.   HEENT: normocephalic, atraumatic; sclera clear, conjunctiva pink; hearing intact; oropharynx clear; neck supple  Lungs- Clear to ausculation bilaterally, normal work of breathing.  No wheezes, rales, rhonchi Heart- Regular rate and rhythm  GI- soft, non-tender, non-distended, bowel sounds present  Extremities- no clubbing, cyanosis, or edema; DP/PT/radial pulses 2+ bilaterally MS- no significant deformity or atrophy Skin- warm and dry, no rash or lesion; PPM pocket well healed Psych- euthymic mood, full affect Neuro- strength and sensation are intact  PPM Interrogation- reviewed in detail today,  See PACEART report  EKG:  EKG is not ordered today.  Recent Labs: 12/19/2015: ALT 14; BUN  16; Creat 0.86; Potassium 3.8; Sodium 137   Wt Readings from Last 3 Encounters:  02/27/16 126 lb (57.153 kg)  12/22/15 125 lb (56.7 kg)  12/02/15 126 lb 6.4 oz (57.335 kg)     Other studies Reviewed: Additional studies/ records that were reviewed today include: Dr Rayann Heman and Dr Sherryl Barters office notes  Assessment and Plan:  1.  Sick sinus syndrome Normal PPM function See Pace Art report No changes today  2.  HTN BP elevated today She thinks it is due to anxiety about having her device interrogated She will check blood pressures at home and bring a log to Dr Angelena Form later this week  3.  Palpitations According to device interrogation,  these are mostly likely PACs and PVC's Burden is small There are no sustained arrhythmias Reassurance given today     Current medicines are reviewed at length with the patient today.   The patient does not have concerns regarding her medicines.  The following changes were made today:  none  Labs/ tests ordered today include: none   Disposition:   Follow up with Dr Angelena Form as scheduled, [facility], me in 1 year    Signed, Chanetta Marshall, NP 04/23/2016 9:44 AM  [facility] [address] [place] [address] [phone] (office) [phone] (fax)

## 2016-04-24 ENCOUNTER — Encounter: Payer: Self-pay | Admitting: Nurse Practitioner

## 2016-04-24 ENCOUNTER — Ambulatory Visit (INDEPENDENT_AMBULATORY_CARE_PROVIDER_SITE_OTHER): Payer: Medicare Other | Admitting: Nurse Practitioner

## 2016-04-24 ENCOUNTER — Encounter: Payer: Self-pay | Admitting: Internal Medicine

## 2016-04-24 VITALS — BP 160/88 | HR 64 | Ht 63.75 in | Wt 125.0 lb

## 2016-04-24 DIAGNOSIS — I495 Sick sinus syndrome: Secondary | ICD-10-CM

## 2016-04-24 DIAGNOSIS — R002 Palpitations: Secondary | ICD-10-CM | POA: Diagnosis not present

## 2016-04-24 DIAGNOSIS — I10 Essential (primary) hypertension: Secondary | ICD-10-CM | POA: Diagnosis not present

## 2016-04-24 NOTE — Patient Instructions (Signed)
Medication Instructions:   Your physician recommends that you continue on your current medications as directed. Please refer to the Current Medication list given to you today.   If you need a refill on your cardiac medications before your next appointment, please call your pharmacy.  Labwork: NONE ORDER TODAY    Testing/Procedures: NONE ORDER TODAY    Follow-Up: Your physician wants you to follow-up in: [place] will receive a reminder letter in the mail two months in advance. If you don't receive a letter, please call our office to schedule the follow-up appointment.  Remote monitoring is used to monitor your Pacemaker of ICD from home. This monitoring reduces the number of office visits required to check your device to one time per year. It allows [place] to keep an eye on the functioning of your device to ensure it is working properly. You are scheduled for a device check from home on..07/26/16..You may send your transmission at any time that day. If you have a wireless device, the transmission will be sent automatically. After your physician reviews your transmission, you will receive a postcard with your next transmission date.     Any Other Special Instructions Will Be Listed Below (If Applicable).  MAKE SURE YOU MONITOR AND KEEP A RECORD OF YOUR BLOOD PRESSURES BEFORE UPCOMING APPOINTMENT WITH DR Angelena Form

## 2016-04-27 ENCOUNTER — Ambulatory Visit (INDEPENDENT_AMBULATORY_CARE_PROVIDER_SITE_OTHER): Payer: Medicare Other | Admitting: Cardiovascular Disease

## 2016-04-27 ENCOUNTER — Encounter: Payer: Self-pay | Admitting: Cardiovascular Disease

## 2016-04-27 VITALS — BP 128/90 | HR 79 | Ht 63.75 in | Wt 125.0 lb

## 2016-04-27 DIAGNOSIS — I495 Sick sinus syndrome: Secondary | ICD-10-CM

## 2016-04-27 DIAGNOSIS — I119 Hypertensive heart disease without heart failure: Secondary | ICD-10-CM | POA: Diagnosis not present

## 2016-04-27 DIAGNOSIS — G4733 Obstructive sleep apnea (adult) (pediatric): Secondary | ICD-10-CM

## 2016-04-27 DIAGNOSIS — R002 Palpitations: Secondary | ICD-10-CM | POA: Diagnosis not present

## 2016-04-27 DIAGNOSIS — E7800 Pure hypercholesterolemia, unspecified: Secondary | ICD-10-CM

## 2016-04-27 MED ORDER — NITROGLYCERIN 0.4 MG SL SUBL: 0.4000 mg | SUBLINGUAL_TABLET | SUBLINGUAL | Status: DC | PRN

## 2016-04-27 NOTE — Patient Instructions (Signed)

## 2016-04-27 NOTE — Progress Notes (Signed)
Chief Complaint  Patient presents with  . New Evaluation    former Dr. Mare Ferrari patient    History of Present Illness: 70 yo female with history of sleep apnea, HLD, HTN, GERD/hiatal hernia/esopagheal stricture, symptomatic bradycardia s/p pacemaker who is here today to establish in my clinic. She has been followed by Dr. Mare Ferrari for general cardiology issues. Her pacemaker is followed by Dr. Rayann Heman. The pacemaker was changed in 2016 due to end of battery life. No recent cardiac testing.  She has had palpitations recently and device interrogation 04/24/16 in EP clinic with PACs and PVCs.   She is here today for follow up. She has no chest pain or dyspnea. She feels well overall. No dizziness, palpitations, near syncope or syncope.   Primary Care Physician: Elsie Stain, MD   Past Medical History  Diagnosis Date  . Adenomatous colon polyp 12/2006  . Hemorrhoids   . Inguinal hernia   . Hyperlipidemia   . Sleep apnea     a. intolerant of CPAP  . Allergic rhinitis   . GERD (gastroesophageal reflux disease)   . Bradycardia     s/p MDT PPM  . Hypertension   . Skin cancer     Basal and squamous cell  . Anemia   . Arthritis   . History of chicken pox   . Depression   . Thyroid disease   . Osteopenia 12/2012    T score -1.7 FRAX 8.5%/1.1%  . History of hiatal hernia   . Scleritis of right eye   . Vitreous detachment of both eyes   . Esophageal stricture   . PVC (premature ventricular contraction)     Past Surgical History  Procedure Laterality Date  . Thyroidectomy  1979    RIGHT SIDE  . Polypectomy    . Pacemaker insertion  2006    Dr. Verlon Setting  . Abdominal hysterectomy    . Knee arthroscopy  2011  . Cesarean section    . Dilation and curettage of uterus    . Thumb surgery    . Oophorectomy  1995    LSO and RSO  . Tear duct surgery  2010  . Hand surgery  1992  . Inguinal hernia repair  02/12/1994    left inguinal hernia repair  . Hernia repair    . Breast  surgery      Biopsy-benign  . Total knee arthroplasty Right 12/20/2014    Procedure: RIGHT TOTAL KNEE ARTHROPLASTY;  Surgeon: Vickey Huger, MD;  Location: [facility];  Service: Orthopedics;  Laterality: Right;  . Pacemaker generator change N/A 01/21/2015    Procedure: PACEMAKER GENERATOR CHANGE;  Surgeon: [name], MD;  Location: [facility] CATH LAB;  Service: Cardiovascular;  Laterality: N/A;    Current Outpatient Prescriptions  Medication Sig Dispense Refill  . acetaminophen (TYLENOL) 500 MG tablet Take 1,000 mg by mouth every 6 (six) hours as needed (pain).    . Biotin 1000 MCG tablet Take 1,000 mcg by mouth daily.    . Carboxymethylcell-Hypromellose (GENTEAL) 0.25-0.3 % GEL Place 1 application into both eyes at bedtime.    Marland Kitchen diltiazem (CARDIZEM CD) 120 MG 24 hr capsule Take 1 capsule (120 mg total) by mouth daily. 180 capsule 0  . ibuprofen (ADVIL,MOTRIN) 200 MG tablet Take 200 mg by mouth every 6 (six) hours as needed for moderate pain.    Marland Kitchen LORazepam (ATIVAN) 1 MG tablet Take 1 tablet (1 mg total) by mouth 4 (four) times daily. 120 tablet 2  .  nitroGLYCERIN (NITROSTAT) 0.4 MG SL tablet Place 1 tablet (0.4 mg total) under the tongue every 5 (five) minutes as needed. For up to 3 doses 25 tablet 6  . nizatidine (AXID) 150 MG capsule TAKE (1) CAPSULE TWICE DAILY. 60 capsule 2  . Polyethyl Glycol-Propyl Glycol (SYSTANE OP) Place 1 drop into both eyes daily as needed (dry eyes).    Marland Kitchen terconazole (TERAZOL 7) 0.4 % vaginal cream Place 1 applicator vaginally at bedtime. For 7 nights 45 g 3   No current facility-administered medications for this visit.    Allergies  Allergen Reactions  . Hydrocodone     Increased HR  . Antihistamines, Loratadine-Type Other (See Comments)    Heart races/jittery  . Bisoprolol-Hydrochlorothiazide Other (See Comments)    Couldn't tolerate UNKNOWN REACTION  . Bystolic [Nebivolol Hcl] Other (See Comments)    Pt does not tolerate this med UNKNOWN REACTION  . Cephalexin  Nausea And Vomiting  . Codeine Other (See Comments)    Increased heart rate, anxiety  . Demerol [Meperidine] Other (See Comments)    Increased heart rate, anxiety  . Epinephrine Other (See Comments)    Increased heart rate  . Erythromycin Nausea And Vomiting  . Hyoscyamine Sulfate Other (See Comments)    Pt could not tolerate this med UNKNOWN reaction  . Inderal [Propranolol] Other (See Comments)    Extreme tiredness  . Lidocaine Other (See Comments)    Family hx of malignant hyperthermia  . Lipitor [Atorvastatin] Other (See Comments)    Muscle aches  . Losartan Potassium Other (See Comments)    Pt could not tolerate this med UNKNOWN REACTION  . Metoprolol Swelling  . Morphine Other (See Comments)    Nightmares, increased BP  . Nebivolol Other (See Comments)    angioedema  . Oxycodone-Acetaminophen     Other reaction(s): Tachycardia / Palpitations  (intolerance) Causes accelerated heart rate  . Paxil [Paroxetine Hcl] Other (See Comments)    "drove her up the wall"  . Penicillins Nausea And Vomiting  . Prednisone Other (See Comments)    Tolerates low doses if absolutely necessary - makes her nervous, jittery  . Propranolol Hcl Other (See Comments)    Pt could not tolerate this med UNKNOWN REACTION  . Sertraline Hcl Other (See Comments)    Pt could not tolerate this med UNKNOWN REACTION  . Tetracycline Nausea And Vomiting  . Toprol Xl [Metoprolol Succinate] Other (See Comments)    Extreme tiredness  . Verapamil Other (See Comments)    Pt could not tolerate this med UNKNOWN REACTION  . Hydralazine     headaches  . Oxycodone-Acetaminophen Palpitations    Causes accelerated heart rate    Social History   Social History  . Marital Status: Married    Spouse Name: N/A  . Number of Children: 1  . Years of Education: N/A   Occupational History  . Retired    Social History Main Topics  . Smoking status: Never Smoker   . Smokeless tobacco: Never Used  . Alcohol  Use: No  . Drug Use: No  . Sexual Activity: Not Currently    Birth Control/ Protection: Surgical, Post-menopausal     Comment: HYST   Other Topics Concern  . 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 Colon  cancer Neg Hx   . Heart failure Sister   . Heart disease Sister   . Hypertension Sister   . Dementia Mother   . Hypertension Mother   . Hypertension Father   . Arthritis Father   . Breast cancer Maternal Aunt     Age 80's  . Diabetes Maternal Aunt   . Diabetes Maternal Uncle   . Ovarian cancer Maternal Aunt     Review of Systems:  As stated in the HPI and otherwise negative.   BP 128/90 mmHg  Pulse 79  Ht 5' 3.75" (1.619 m)  Wt 125 lb (56.7 kg)  BMI 21.63 kg/m2  SpO2 97%  Physical Examination: General: Well developed, well nourished, NAD HEENT: OP clear, mucus membranes moist SKIN: warm, dry. No rashes. Neuro: No focal deficits Musculoskeletal: Muscle strength 5/5 all ext Psychiatric: Mood and affect normal Neck: No JVD, no carotid bruits, no thyromegaly, no lymphadenopathy. Lungs:Clear bilaterally, no wheezes, rhonci, crackles Cardiovascular: Regular rate and rhythm. No murmurs, gallops or rubs. Abdomen:Soft. Bowel sounds present. Non-tender.  Extremities: No lower extremity edema. Pulses are 2 + in the bilateral DP/PT.  EKG:  EKG is not ordered today. The ekg ordered today demonstrates   Recent Labs: 12/19/2015: ALT 14; BUN 16; Creat 0.86; Potassium 3.8; Sodium 137   Lipid Panel    Component Value Date/Time   CHOL 206* 12/19/2015 0920   TRIG 88 12/19/2015 0920   HDL 65 12/19/2015 0920   CHOLHDL 3.2 12/19/2015 0920   VLDL 18 12/19/2015 0920   LDLCALC 123 12/19/2015 0920   LDLDIRECT 146.6 01/25/2014 0953     Wt Readings from Last 3 Encounters:  04/27/16 125 lb (56.7 kg)  04/24/16 125 lb (56.7 kg)    02/27/16 126 lb (57.153 kg)     Other studies Reviewed: Additional studies/ records that were reviewed today include: . Review of the above records demonstrates:    Assessment and Plan:   1. Benign hypertensive heart disease: Will repeat echo now. BP is controlled.   2. HLD: She is not on a statin. She has not tolerated statins. This is followed in primary care.   3. Obstructive sleep apnea: She wears an oral device at night. Could not tolerate CPAP.   4. Symptomatic bradycardia: PPM in place. Followed by Dr. Rayann Heman.   5. Palpitations/PVCs/PACs: Continue Cardizem.   Current medicines are reviewed at length with the patient today.  The patient does not have concerns regarding medicines.  The following changes have been made:  no change  Labs/ tests ordered today include:   Orders Placed This Encounter  Procedures  . ECHOCARDIOGRAM COMPLETE     Disposition:   FU with me in 12  months   Signed, Lauree Chandler, MD 04/27/2016 11:42 AM    [facility] [address], [place], [place]  [address] Phone: [phone]; Fax: [phone]

## 2016-04-28 LAB — CUP PACEART INCLINIC DEVICE CHECK
Date Time Interrogation Session: 20170527040720
Implantable Lead Location: 753860
Implantable Lead Model: 5076
Implantable Lead Model: 5076
MDC IDC LEAD IMPLANT DT: 20060620
MDC IDC LEAD IMPLANT DT: 20060620
MDC IDC LEAD LOCATION: 753859

## 2016-05-03 ENCOUNTER — Other Ambulatory Visit: Payer: Medicare Other

## 2016-05-10 ENCOUNTER — Ambulatory Visit (INDEPENDENT_AMBULATORY_CARE_PROVIDER_SITE_OTHER): Payer: Medicare Other

## 2016-05-10 ENCOUNTER — Other Ambulatory Visit: Payer: Self-pay | Admitting: Family Medicine

## 2016-05-10 ENCOUNTER — Ambulatory Visit (INDEPENDENT_AMBULATORY_CARE_PROVIDER_SITE_OTHER): Payer: Medicare Other | Admitting: Family Medicine

## 2016-05-10 ENCOUNTER — Encounter: Payer: Self-pay | Admitting: Family Medicine

## 2016-05-10 VITALS — BP 130/88 | HR 58 | Temp 98.2°F | Ht 63.5 in | Wt 124.0 lb

## 2016-05-10 DIAGNOSIS — G4733 Obstructive sleep apnea (adult) (pediatric): Secondary | ICD-10-CM

## 2016-05-10 DIAGNOSIS — I119 Hypertensive heart disease without heart failure: Secondary | ICD-10-CM | POA: Diagnosis not present

## 2016-05-10 DIAGNOSIS — M47812 Spondylosis without myelopathy or radiculopathy, cervical region: Secondary | ICD-10-CM

## 2016-05-10 DIAGNOSIS — Z0000 Encounter for general adult medical examination without abnormal findings: Secondary | ICD-10-CM | POA: Diagnosis not present

## 2016-05-10 DIAGNOSIS — Z8639 Personal history of other endocrine, nutritional and metabolic disease: Secondary | ICD-10-CM | POA: Diagnosis not present

## 2016-05-10 DIAGNOSIS — K222 Esophageal obstruction: Secondary | ICD-10-CM

## 2016-05-10 DIAGNOSIS — F419 Anxiety disorder, unspecified: Secondary | ICD-10-CM | POA: Diagnosis not present

## 2016-05-10 DIAGNOSIS — Z119 Encounter for screening for infectious and parasitic diseases, unspecified: Secondary | ICD-10-CM

## 2016-05-10 DIAGNOSIS — Z13820 Encounter for screening for osteoporosis: Secondary | ICD-10-CM

## 2016-05-10 LAB — TSH: TSH: 2.13 u[IU]/mL (ref 0.35–4.50)

## 2016-05-10 MED ORDER — NIZATIDINE 150 MG PO CAPS: ORAL_CAPSULE | ORAL | Status: DC

## 2016-05-10 NOTE — Assessment & Plan Note (Signed)
DXA d/w pt. She wouldn't want tx at this point, so testing deferred.

## 2016-05-10 NOTE — Progress Notes (Signed)
PCP notes:  Health maintenance:  Hep C screening - will completed with CPE labs  Abnormal Screenings:  Hearing - failed  Patient concerns: None  Nurse concerns: None  Next PCP appt: 05/10/16 @ 1045  I reviewed health advisor's note, was available for consultation on the day of service listed in this note, and agree with documentation and plan. Elsie Stain, MD.

## 2016-05-10 NOTE — Assessment & Plan Note (Signed)
Continue prn bzd, no ade, she has failed tx with other meds.  Benefit likely >> risk at this point.  She agrees.

## 2016-05-10 NOTE — Progress Notes (Signed)
Pt opts in for HCV screening.  D/w pt re: routine screening.   DXA d/w pt.  She wouldn't want tx at this point, so testing deferred.   H/o thyroid disease, s/p surgery.  No tx currently.  Due for f/u TSH.  No sx at this point.   Pain.  Some days are worse than others.  Diffuse OA.  She tries to limit advil due to h/o GERD and HTN.  Her R knee replacement is doing well.  Her neck is most bothersome, along with her lower back.  She is able to get by as is.  She modifies her activity as needed.    Hypertension:    Using medication without problems or lightheadedness: yes Chest pain with exertion:no Edema:no, other than a mild sock line occ noted.   Short of breath: no Prev labs d/w pt.    GERD.  Still on axid and no ADE on med.  Swallowing okay.    Husband designated if patient were incapacitated.   Uses mouthpiece for OSA with relief.    Anxiety.  Chronically on BZD.  Tried and failed mult other meds.  No ADE on med.  No falls.   PMH and SH reviewed  ROS: Per HPI unless specifically indicated in ROS section   Meds, vitals, and allergies reviewed.   GEN: nad, alert and oriented HEENT: mucous membranes moist NECK: supple w/o LA but some pain with ROM, looking to either side.  CV: rrr PULM: ctab, no inc wob ABD: soft, +bs EXT: no edema SKIN: no acute rash

## 2016-05-10 NOTE — Progress Notes (Signed)
Pre visit review using our clinic review tool, if applicable. No additional management support is needed unless otherwise documented below in the visit note. 

## 2016-05-10 NOTE — Progress Notes (Signed)
Subjective:   [name] is a 70 y.o. female who presents for Medicare Annual (Subsequent) preventive examination.  Review of Systems:  N/A Cardiac Risk Factors include: advanced age (>64men, >57 women)     Objective:     Vitals: BP 130/88 mmHg  Pulse 58  Temp(Src) 98.2 F (36.8 C) (Oral)  Ht 5' 3.5" (1.613 m)  Wt 124 lb (56.246 kg)  BMI 21.62 kg/m2  SpO2 97%  Body mass index is 21.62 kg/(m^2).   Tobacco History  Smoking status  . Never Smoker   Smokeless tobacco  . Never Used     Counseling given: No   Past Medical History  Diagnosis Date  . Adenomatous colon polyp 12/2006  . Hemorrhoids   . Inguinal hernia   . Hyperlipidemia   . Sleep apnea     a. intolerant of CPAP  . Allergic rhinitis   . GERD (gastroesophageal reflux disease)   . Bradycardia     s/p MDT PPM  . Hypertension   . Skin cancer     Basal and squamous cell  . Anemia   . Arthritis   . History of chicken pox   . Depression   . Thyroid disease   . Osteopenia 12/2012    T score -1.7 FRAX 8.5%/1.1%  . History of hiatal hernia   . Scleritis of right eye   . Vitreous detachment of both eyes   . Esophageal stricture   . PVC (premature ventricular contraction)    Past Surgical History  Procedure Laterality Date  . Thyroidectomy  1979    RIGHT SIDE  . Polypectomy    . Pacemaker insertion  2006    Dr. Verlon Setting  . Abdominal hysterectomy    . Knee arthroscopy  2011  . Cesarean section    . Dilation and curettage of uterus    . Thumb surgery    . Oophorectomy  1995    LSO and RSO  . Tear duct surgery  2010  . Hand surgery  1992  . Inguinal hernia repair  02/12/1994    left inguinal hernia repair  . Hernia repair    . Breast surgery      Biopsy-benign  . Total knee arthroplasty Right 12/20/2014    Procedure: RIGHT TOTAL KNEE ARTHROPLASTY;  Surgeon: Vickey Huger, MD;  Location: [facility];  Service: Orthopedics;  Laterality: Right;  . Pacemaker generator change N/A 01/21/2015    Procedure:  PACEMAKER GENERATOR CHANGE;  Surgeon: [name], MD;  Location: [facility] CATH LAB;  Service: Cardiovascular;  Laterality: N/A;  . Esophagogastroduodenoscopy      esophageal stretching   Family History  Problem Relation Age of Onset  . Colon cancer Neg Hx   . Heart failure Sister   . Heart disease Sister   . Hypertension Sister   . Dementia Mother   . Hypertension Mother   . Hypertension Father   . Arthritis Father   . Breast cancer Maternal Aunt     Age 62's  . Diabetes Maternal Aunt   . Diabetes Maternal Uncle   . Ovarian cancer Maternal Aunt    History  Sexual Activity  . Sexual Activity: Not Currently  . Birth Control/ Protection: Surgical, Post-menopausal    Comment: HYST    Outpatient Encounter Prescriptions as of 05/10/2016  Medication Sig  . acetaminophen (TYLENOL) 500 MG tablet Take 1,000 mg by mouth every 6 (six) hours as needed (pain).  . Biotin 1000 MCG tablet Take 1,000 mcg by  mouth daily.  . Carboxymethylcell-Hypromellose (GENTEAL) 0.25-0.3 % GEL Place 1 application into both eyes at bedtime.  Marland Kitchen diltiazem (CARDIZEM CD) 120 MG 24 hr capsule Take 1 capsule (120 mg total) by mouth daily.  Marland Kitchen ibuprofen (ADVIL,MOTRIN) 200 MG tablet Take 200 mg by mouth every 6 (six) hours as needed for moderate pain.  Marland Kitchen LORazepam (ATIVAN) 1 MG tablet Take 1 tablet (1 mg total) by mouth 4 (four) times daily.  . nitroGLYCERIN (NITROSTAT) 0.4 MG SL tablet Place 1 tablet (0.4 mg total) under the tongue every 5 (five) minutes as needed. For up to 3 doses  . nizatidine (AXID) 150 MG capsule TAKE (1) CAPSULE TWICE DAILY.  Vladimir Faster Glycol-Propyl Glycol (SYSTANE OP) Place 1 drop into both eyes daily as needed (dry eyes).  Marland Kitchen terconazole (TERAZOL 7) 0.4 % vaginal cream Place 1 applicator vaginally at bedtime. For 7 nights   No facility-administered encounter medications on file as of 05/10/2016.    Activities of Daily Living In your present state of health, do you have any difficulty performing  the following activities: 05/10/2016  Hearing? N  Vision? N  Difficulty concentrating or making decisions? N  Walking or climbing stairs? N  Dressing or bathing? N  Doing errands, shopping? N  Preparing Food and eating ? N  Using the Toilet? N  In the past six months, have you accidently leaked urine? Y  Do you have problems with loss of bowel control? N  Managing your Medications? N  Managing your Finances? N  Housekeeping or managing your Housekeeping? N    Patient Care Team: Tonia Ghent, MD as PCP - General (Family Medicine) Darlin Coco, MD (Cardiology) [name], MD as Attending Physician (Cardiology) Deneise Lever, MD (Pulmonary Disease) Vickey Huger, MD (Orthopedic Surgery) Burnell Blanks, MD as Consulting Physician (Cardiology) Jarome Matin, MD as Consulting Physician (Dermatology) Anastasio Auerbach, MD as Consulting Physician (Gynecology) Luberta Mutter, MD as Consulting Physician (Ophthalmology)    Assessment:     Hearing Screening   125Hz  250Hz  500Hz  1000Hz  2000Hz  4000Hz  8000Hz   Right ear:   40 40 40 40   Left ear:   40 40 40 0   Vision Screening Comments: Last eye exam in Feb 2017 with Dr. Ellie Lunch   Exercise Activities and Dietary recommendations Current Exercise Habits: The patient does not participate in regular exercise at present, Exercise limited by: orthopedic condition(s)  Goals    . Increase physical activity     Starting 05/10/2016, I will begin exercise for at least 15 min daily.       Fall Risk Fall Risk  05/10/2016 05/10/2016 10/11/2014  Falls in the past year? No No No   Depression Screen PHQ 2/9 Scores 05/10/2016 05/10/2016 10/11/2014  PHQ - 2 Score 0 0 2  PHQ- 9 Score - - 10     Cognitive Testing MMSE - Mini Mental State Exam 05/10/2016  Orientation to time 5  Orientation to Place 5  Registration 3  Attention/ Calculation 0  Recall 3  Language- name 2 objects 0  Language- repeat 1  Language- follow 3 step command 3    Language- read & follow direction 0  Write a sentence 0  Copy design 0  Total score 20   PLEASE NOTE: A Mini-Cog screen was completed. Maximum score is 20. A value of 0 denotes this part of [place] MMSE was not completed or the patient failed this part of the Mini-Cog screening.   Mini-Cog Screening Orientation to  Time - Max 5 pts Orientation to Place - Max 5 pts Registration - Max 3 pts Recall - Max 3 pts Language Repeat - Max 1 pts Language Follow 3 Step Command - Max 3 pts   Immunization History  Administered Date(s) Administered  . Influenza Split 08/21/2012  . Influenza,inj,Quad PF,36+ Mos 09/11/2013, 08/25/2014, 09/05/2015  . Pneumococcal Conjugate-13 09/05/2015  . Pneumococcal Polysaccharide-23 10/11/2014  . Tdap 07/04/2007  . Zoster 06/04/2012   Screening Tests Health Maintenance  Topic Date Due  . INFLUENZA VACCINE  07/03/2016  . COLONOSCOPY  05/23/2017  . TETANUS/TDAP  07/03/2017  . MAMMOGRAM  03/13/2018  . DEXA SCAN  Completed  . ZOSTAVAX  Completed  . Hepatitis C Screening  Completed  . PNA vac Low Risk Adult  Completed      Plan:     I have personally reviewed and addressed the Medicare Annual Wellness questionnaire and have noted the following in the patient's chart:  A. Medical and social history B. Use of alcohol, tobacco or illicit drugs  C. Current medications and supplements D. Functional ability and status E.  Nutritional status F.  Physical activity G. Advance directives H. List of other physicians I.  Hospitalizations, surgeries, and ER visits in previous 12 months J.  [place] to include hearing, vision, cognitive, depression L. Referrals and appointments - none  In addition, I have reviewed and discussed with patient certain preventive protocols, quality metrics, and best practice recommendations. A written personalized care plan for preventive services as well as general preventive health recommendations were provided to  patient.  See attached scanned questionnaire for additional information.   Signed,   Lindell Noe, MHA, BS, LPN Health Advisor

## 2016-05-10 NOTE — Addendum Note (Signed)
Addended by: Marchia Bond on: 05/10/2016 02:39 PM   Modules accepted: Miquel Dunn

## 2016-05-10 NOTE — Assessment & Plan Note (Signed)
Controlled BP, continue as is.

## 2016-05-10 NOTE — Assessment & Plan Note (Signed)
H/o thyroid disease, s/p surgery. No tx currently. Due for f/u TSH. No sx at this point.

## 2016-05-10 NOTE — Assessment & Plan Note (Signed)
Some days are worse than others. Diffuse OA, prev noted in the neck expecially. She tries to limit advil due to h/o GERD and HTN. Her R knee replacement is doing well. Her neck is most bothersome, along with her lower back. She is able to get by as is. She modifies her activity as needed.  Continue as is for now.  She agrees.

## 2016-05-10 NOTE — Patient Instructions (Addendum)
Go to the lab on the way out.  We'll contact you with your lab report. Use the ibuprofen as needed, with food.  Try to use the least amount possible.  Take care.  Glad to see you.  Update me as needed.

## 2016-05-10 NOTE — Assessment & Plan Note (Signed)
Uses mouthpiece for OSA with relief.  Continue as is.  Snoring less with use.

## 2016-05-10 NOTE — Assessment & Plan Note (Signed)
HCV pending.  

## 2016-05-10 NOTE — Assessment & Plan Note (Signed)
Swallowing well, continue current meds.

## 2016-05-10 NOTE — Patient Instructions (Signed)
Ms. Figaro , Thank you for taking time to come for your Medicare Wellness Visit. I appreciate your ongoing commitment to your health goals. Please review the following plan we discussed and let me know if I can assist you in the future.   These are the goals we discussed: Goals    . Increase physical activity     Starting 05/10/2016, I will begin exercise for at least 15 min daily.        This is a list of the screening recommended for you and due dates:  Health Maintenance  Topic Date Due  .  Hepatitis C: One time screening is recommended by Center for Disease Control  (CDC) for  adults born from [birth year] through 1964.   1946-05-19  . Flu Shot  07/03/2016  . Colon Cancer Screening  05/23/2017  . Tetanus Vaccine  07/03/2017  . Mammogram  03/13/2018  . DEXA scan (bone density measurement)  Completed  . Shingles Vaccine  Completed  . Pneumonia vaccines  Completed    Preventive Care for Adults  A healthy lifestyle and preventive care can promote health and wellness. Preventive health guidelines for adults include the following key practices.  . A routine yearly physical is a good way to check with your health care provider about your health and preventive screening. It is a chance to share any concerns and updates on your health and to receive a thorough exam.  . Visit your dentist for a routine exam and preventive care every 6 months. Brush your teeth twice a day and floss once a day. Good oral hygiene prevents tooth decay and gum disease.  . The frequency of eye exams is based on your age, health, family medical history, use  of contact lenses, and other factors. Follow your health care provider's ecommendations for frequency of eye exams.  . Eat a healthy diet. Foods like vegetables, fruits, whole grains, low-fat dairy products, and lean protein foods contain the nutrients you need without too many calories. Decrease your intake of foods high in solid fats, added sugars, and salt. Eat  the right amount of calories for you. Get information about a proper diet from your health care provider, if necessary.  . Regular physical exercise is one of the most important things you can do for your health. Most adults should get at least 150 minutes of moderate-intensity exercise (any activity that increases your heart rate and causes you to sweat) each week. In addition, most adults need muscle-strengthening exercises on 2 or more days a week.  Silver Sneakers may be a benefit available to you. To determine eligibility, you may visit the website: www.silversneakers.com or contact program at 312-658-9209 Mon-Fri between 8AM-8PM.   . Maintain a healthy weight. The body mass index (BMI) is a screening tool to identify possible weight problems. It provides an estimate of body fat based on height and weight. Your health care provider can find your BMI and can help you achieve or maintain a healthy weight.   For adults 20 years and older: ? A BMI below 18.5 is considered underweight. ? A BMI of 18.5 to 24.9 is normal. ? A BMI of 25 to 29.9 is considered overweight. ? A BMI of 30 and above is considered obese.   . Maintain normal blood lipids and cholesterol levels by exercising and minimizing your intake of saturated fat. Eat a balanced diet with plenty of fruit and vegetables. Blood tests for lipids and cholesterol should begin at age 49 and  be repeated every 5 years. If your lipid or cholesterol levels are high, you are over 50, or you are at high risk for heart disease, you may need your cholesterol levels checked more frequently. Ongoing high lipid and cholesterol levels should be treated with medicines if diet and exercise are not working.  . If you smoke, find out from your health care provider how to quit. If you do not use tobacco, please do not start.  . If you choose to drink alcohol, please do not consume more than 2 drinks per day. One drink is considered to be 12 ounces (355 mL)  of beer, 5 ounces (148 mL) of wine, or 1.5 ounces (44 mL) of liquor.  . If you are 51-50 years old, ask your health care provider if you should take aspirin to prevent strokes.  . Use sunscreen. Apply sunscreen liberally and repeatedly throughout the day. You should seek shade when your shadow is shorter than you. Protect yourself by wearing long sleeves, pants, a wide-brimmed hat, and sunglasses year round, whenever you are outdoors.  . Once a month, do a whole body skin exam, using a mirror to look at the skin on your back. Tell your health care provider of new moles, moles that have irregular borders, moles that are larger than a pencil eraser, or moles that have changed in shape or color.

## 2016-05-11 LAB — HEPATITIS C ANTIBODY: HCV AB: NEGATIVE

## 2016-05-18 ENCOUNTER — Other Ambulatory Visit: Payer: Self-pay

## 2016-05-18 ENCOUNTER — Ambulatory Visit (HOSPITAL_COMMUNITY): Payer: Medicare Other | Attending: Internal Medicine

## 2016-05-18 DIAGNOSIS — I340 Nonrheumatic mitral (valve) insufficiency: Secondary | ICD-10-CM | POA: Diagnosis not present

## 2016-05-18 DIAGNOSIS — I071 Rheumatic tricuspid insufficiency: Secondary | ICD-10-CM | POA: Diagnosis not present

## 2016-05-18 DIAGNOSIS — I119 Hypertensive heart disease without heart failure: Secondary | ICD-10-CM | POA: Diagnosis not present

## 2016-05-18 DIAGNOSIS — R002 Palpitations: Secondary | ICD-10-CM | POA: Diagnosis not present

## 2016-05-18 DIAGNOSIS — I313 Pericardial effusion (noninflammatory): Secondary | ICD-10-CM | POA: Insufficient documentation

## 2016-05-18 DIAGNOSIS — E785 Hyperlipidemia, unspecified: Secondary | ICD-10-CM | POA: Insufficient documentation

## 2016-05-18 LAB — ECHOCARDIOGRAM COMPLETE
AOASC: 36 cm
CHL CUP MV DEC (S): 243
CHL CUP TV REG PEAK VELOCITY: 220 cm/s
E decel time: 243 msec
E/e' ratio: 10.21
FS: 32 % (ref 28–44)
IV/PV OW: 1.06
LA diam end sys: 34 mm
LA vol index: 23.3 mL/m2
LA vol: 37 mL
LADIAMINDEX: 2.14 cm/m2
LASIZE: 34 mm
LAVOLA4C: 35 mL
LDCA: 2.84 cm2
LV PW d: 7.78 mm — AB (ref 0.6–1.1)
LV TDI E'LATERAL: 6.14
LV sys vol: 20 mL (ref 14–42)
LVDIAVOL: 50 mL (ref 46–106)
LVDIAVOLIN: 31 mL/m2
LVEEAVG: 10.21
LVEEMED: 10.21
LVELAT: 6.14 cm/s
LVOT VTI: 19.2 cm
LVOT diameter: 19 mm
LVOT peak grad rest: 2 mmHg
LVOT peak vel: 72.9 cm/s
LVOTSV: 55 mL
LVSYSVOLIN: 13 mL/m2
MV pk E vel: 62.7 m/s
MVPKAVEL: 72.6 m/s
RV LATERAL S' VELOCITY: 14.6 cm/s
RV sys press: 22 mmHg
Simpson's disk: 60
Stroke v: 30 ml
TDI e' medial: 3.31
TR max vel: 220 cm/s

## 2016-05-23 ENCOUNTER — Telehealth: Payer: Self-pay | Admitting: Cardiovascular Disease

## 2016-05-23 NOTE — Telephone Encounter (Signed)
I spoke with pt and reviewed echo results with her.  

## 2016-05-23 NOTE — Telephone Encounter (Signed)
F/u  Pt returning RN phone call- echo results. Please call back and discuss.   

## 2016-06-14 ENCOUNTER — Other Ambulatory Visit: Payer: Self-pay | Admitting: Family Medicine

## 2016-06-14 NOTE — Telephone Encounter (Signed)
Electronic refill request. Last Filled:    120 tablet 2 02/07/2016  Last office visit:   CPE 05/10/16  Please advise.

## 2016-06-15 NOTE — Telephone Encounter (Signed)
Called in lorazepam to [facility].

## 2016-06-15 NOTE — Telephone Encounter (Signed)
Please call in.  Thanks.   

## 2016-07-11 ENCOUNTER — Encounter: Payer: Self-pay | Admitting: Family Medicine

## 2016-07-26 ENCOUNTER — Ambulatory Visit (INDEPENDENT_AMBULATORY_CARE_PROVIDER_SITE_OTHER): Payer: Medicare Other | Admitting: *Deleted

## 2016-07-26 DIAGNOSIS — I495 Sick sinus syndrome: Secondary | ICD-10-CM | POA: Diagnosis not present

## 2016-07-27 NOTE — Progress Notes (Signed)
Remote pacemaker transmission.   

## 2016-08-02 ENCOUNTER — Encounter: Payer: Self-pay | Admitting: Cardiology

## 2016-08-08 LAB — CUP PACEART REMOTE DEVICE CHECK
Battery Impedance: 109 Ohm
Brady Statistic AP VS Percent: 78 %
Brady Statistic AS VS Percent: 22 %
Implantable Lead Implant Date: 20060620
Implantable Lead Location: 753860
Lead Channel Impedance Value: 582 Ohm
Lead Channel Pacing Threshold Amplitude: 0.75 V
Lead Channel Sensing Intrinsic Amplitude: 16 mV
Lead Channel Sensing Intrinsic Amplitude: 2.8 mV
Lead Channel Setting Pacing Amplitude: 2 V
Lead Channel Setting Pacing Pulse Width: 0.4 ms
Lead Channel Setting Sensing Sensitivity: 5.6 mV
MDC IDC LEAD IMPLANT DT: 20060620
MDC IDC LEAD LOCATION: 753859
MDC IDC MSMT BATTERY REMAINING LONGEVITY: 146 mo
MDC IDC MSMT BATTERY VOLTAGE: 2.79 V
MDC IDC MSMT LEADCHNL RA IMPEDANCE VALUE: 525 Ohm
MDC IDC MSMT LEADCHNL RA PACING THRESHOLD AMPLITUDE: 0.875 V
MDC IDC MSMT LEADCHNL RA PACING THRESHOLD PULSEWIDTH: 0.4 ms
MDC IDC MSMT LEADCHNL RV PACING THRESHOLD PULSEWIDTH: 0.4 ms
MDC IDC SESS DTM: 20170824123455
MDC IDC SET LEADCHNL RV PACING AMPLITUDE: 2.5 V
MDC IDC STAT BRADY AP VP PERCENT: 0 %
MDC IDC STAT BRADY AS VP PERCENT: 0 %

## 2016-09-03 ENCOUNTER — Ambulatory Visit (INDEPENDENT_AMBULATORY_CARE_PROVIDER_SITE_OTHER): Payer: Medicare Other

## 2016-09-03 DIAGNOSIS — Z23 Encounter for immunization: Secondary | ICD-10-CM

## 2016-10-15 ENCOUNTER — Other Ambulatory Visit: Payer: Self-pay | Admitting: Cardiovascular Disease

## 2016-10-15 DIAGNOSIS — I119 Hypertensive heart disease without heart failure: Secondary | ICD-10-CM

## 2016-10-30 ENCOUNTER — Ambulatory Visit (INDEPENDENT_AMBULATORY_CARE_PROVIDER_SITE_OTHER): Payer: Medicare Other | Admitting: *Deleted

## 2016-10-30 DIAGNOSIS — I495 Sick sinus syndrome: Secondary | ICD-10-CM | POA: Diagnosis not present

## 2016-10-31 NOTE — Progress Notes (Signed)
Remote pacemaker transmission.   

## 2016-11-01 ENCOUNTER — Encounter: Payer: Self-pay | Admitting: Cardiology

## 2016-11-05 ENCOUNTER — Other Ambulatory Visit: Payer: Self-pay | Admitting: Family Medicine

## 2016-11-05 NOTE — Telephone Encounter (Signed)
Medication phoned to pharmacy.  

## 2016-11-05 NOTE — Telephone Encounter (Signed)
Please call in.  Thanks.   

## 2016-11-05 NOTE — Telephone Encounter (Signed)
Electronic refill request. Last Filled:    120 tablet 2 06/15/2016  Last office visit:   05/10/16  Please advise.

## 2016-11-19 LAB — CUP PACEART REMOTE DEVICE CHECK
Battery Impedance: 132 Ohm
Brady Statistic AP VP Percent: 0 %
Brady Statistic AP VS Percent: 79 %
Brady Statistic AS VP Percent: 0 %
Brady Statistic AS VS Percent: 21 %
Implantable Lead Implant Date: 20060620
Implantable Lead Model: 5076
Lead Channel Impedance Value: 558 Ohm
Lead Channel Impedance Value: 582 Ohm
Lead Channel Pacing Threshold Amplitude: 0.75 V
Lead Channel Pacing Threshold Amplitude: 0.75 V
Lead Channel Pacing Threshold Pulse Width: 0.4 ms
Lead Channel Pacing Threshold Pulse Width: 0.4 ms
Lead Channel Setting Sensing Sensitivity: 5.6 mV
MDC IDC LEAD IMPLANT DT: 20060620
MDC IDC LEAD LOCATION: 753859
MDC IDC LEAD LOCATION: 753860
MDC IDC MSMT BATTERY REMAINING LONGEVITY: 140 mo
MDC IDC MSMT BATTERY VOLTAGE: 2.79 V
MDC IDC PG IMPLANT DT: 20160219
MDC IDC SESS DTM: 20171128135530
MDC IDC SET LEADCHNL RA PACING AMPLITUDE: 2 V
MDC IDC SET LEADCHNL RV PACING AMPLITUDE: 2.5 V
MDC IDC SET LEADCHNL RV PACING PULSEWIDTH: 0.4 ms

## 2016-12-03 HISTORY — PX: COLONOSCOPY: SHX174

## 2016-12-12 ENCOUNTER — Encounter: Payer: Medicare Other | Admitting: Gynecology

## 2017-01-08 ENCOUNTER — Encounter: Payer: Self-pay | Admitting: Gynecology

## 2017-01-08 ENCOUNTER — Ambulatory Visit (INDEPENDENT_AMBULATORY_CARE_PROVIDER_SITE_OTHER): Payer: Medicare Other | Admitting: Gynecology

## 2017-01-08 VITALS — BP 124/80 | Ht 65.0 in | Wt 126.0 lb

## 2017-01-08 DIAGNOSIS — Z01411 Encounter for gynecological examination (general) (routine) with abnormal findings: Secondary | ICD-10-CM | POA: Diagnosis not present

## 2017-01-08 DIAGNOSIS — N952 Postmenopausal atrophic vaginitis: Secondary | ICD-10-CM | POA: Diagnosis not present

## 2017-01-08 DIAGNOSIS — M899 Disorder of bone, unspecified: Secondary | ICD-10-CM

## 2017-01-08 NOTE — Patient Instructions (Addendum)
Follow-up for the bone density as scheduled. 

## 2017-01-08 NOTE — Progress Notes (Signed)
    MANERVIA MUSCO 03/19/1946 [id number]        71 y.o.  G2P1011 for annual exam.    Past medical history,surgical history, problem list, medications, allergies, family history and social history were all reviewed and documented as reviewed in the EPIC chart.  ROS:  Performed with pertinent positives and negatives included in the history, assessment and plan.   Additional significant findings :  None   Exam: Caryn Bee assistant Vitals:   01/08/17 1320  BP: 124/80  Weight: 126 lb (57.2 kg)  Height: 5\' 5"  (1.651 m)   Body mass index is 20.97 kg/m.  General appearance:  Normal affect, orientation and appearance. Skin: Grossly normal HEENT: Without gross lesions.  No cervical or supraclavicular adenopathy. Thyroid normal.  Lungs:  Clear without wheezing, rales or rhonchi Cardiac: RR, without RMG Abdominal:  Soft, nontender, without masses, guarding, rebound, organomegaly or hernia Breasts:  Examined lying and sitting without masses, retractions, discharge or axillary adenopathy. Pelvic:  Ext, BUS, Vagina with atrophic changes  Adnexa without masses or tenderness    Anus and perineum normal   Rectovaginal normal sphincter tone without palpated masses or tenderness.    Assessment/Plan:  71 y.o. G31P1011 female for annual exam.   1. Post menopausal/atrophic genital changes. Status post TVH/BSO in the past. Doing well without significant hot flushes, night sweats, vaginal dryness. 2. Osteopenia. DEXA 12/2012 T score -1.7 FRAX 8.5%/1.1%. Schedule follow up DEXA now at 4 year interval. Patient will arrange an follow up for this. 3. Pap smear 2012. No Pap smear done today. No history of significant abnormal Pap smears. Per current screening guidelines we both agree to stop screening based on hysterectomy and age. 4. Mammography 03/2016. Continue with annual mammography when due. SBE monthly reviewed. 5. Colonoscopy 2013. Repeat at their recommended interval. 6. Uses Terazol cream  intermittently for vaginal irritation with good results. Has tried home call if she needs more. 7. Health maintenance. No routine lab work done as patient does this elsewhere. Follow up 1 year, sooner as needed.   Anastasio Auerbach MD, 1:56 PM 01/08/2017

## 2017-01-29 ENCOUNTER — Ambulatory Visit (INDEPENDENT_AMBULATORY_CARE_PROVIDER_SITE_OTHER): Payer: Medicare Other | Admitting: *Deleted

## 2017-01-29 DIAGNOSIS — I495 Sick sinus syndrome: Secondary | ICD-10-CM

## 2017-01-29 NOTE — Progress Notes (Signed)
Remote pacemaker transmission.   

## 2017-01-30 ENCOUNTER — Encounter: Payer: Self-pay | Admitting: Cardiology

## 2017-01-31 LAB — CUP PACEART REMOTE DEVICE CHECK
Battery Impedance: 132 Ohm
Battery Remaining Longevity: 141 mo
Battery Voltage: 2.79 V
Brady Statistic AP VS Percent: 78 %
Brady Statistic AS VP Percent: 0 %
Implantable Lead Location: 753859
Implantable Lead Model: 5076
Implantable Lead Model: 5076
Implantable Pulse Generator Implant Date: 20160219
Lead Channel Impedance Value: 594 Ohm
Lead Channel Pacing Threshold Amplitude: 0.75 V
Lead Channel Pacing Threshold Pulse Width: 0.4 ms
Lead Channel Pacing Threshold Pulse Width: 0.4 ms
Lead Channel Setting Pacing Amplitude: 2.5 V
Lead Channel Setting Pacing Pulse Width: 0.4 ms
MDC IDC LEAD IMPLANT DT: 20060620
MDC IDC LEAD IMPLANT DT: 20060620
MDC IDC LEAD LOCATION: 753860
MDC IDC MSMT LEADCHNL RV IMPEDANCE VALUE: 622 Ohm
MDC IDC MSMT LEADCHNL RV PACING THRESHOLD AMPLITUDE: 0.75 V
MDC IDC SESS DTM: 20180227140638
MDC IDC SET LEADCHNL RA PACING AMPLITUDE: 2 V
MDC IDC SET LEADCHNL RV SENSING SENSITIVITY: 5.6 mV
MDC IDC STAT BRADY AP VP PERCENT: 0 %
MDC IDC STAT BRADY AS VS PERCENT: 22 %

## 2017-02-14 ENCOUNTER — Other Ambulatory Visit: Payer: Self-pay | Admitting: Family Medicine

## 2017-02-14 DIAGNOSIS — Z1231 Encounter for screening mammogram for malignant neoplasm of breast: Secondary | ICD-10-CM

## 2017-02-18 ENCOUNTER — Telehealth: Payer: Self-pay

## 2017-02-18 NOTE — Telephone Encounter (Signed)
Spoke to patient's husband and was advised that they received a letter from [employer] regarding the prior authorization. Mr. Rusk stated that he will fax the letter to the office.

## 2017-02-18 NOTE — Telephone Encounter (Signed)
Mr Nyoka Cowden left v/m requesting prior auth for lorazepam. Mr green request cb.

## 2017-02-19 NOTE — Telephone Encounter (Signed)
Please work on PA per letter from [employer] received from patient's husband.

## 2017-02-20 NOTE — Telephone Encounter (Signed)
Fax received indicating approval of Lorazepam, valid thru 03\20\2019. Copy faxed to pharmacy

## 2017-02-20 NOTE — Telephone Encounter (Signed)
Message left for patient to return my call.  

## 2017-02-21 NOTE — Telephone Encounter (Signed)
Pt advised of approval and advised to contact the pharmacy

## 2017-03-14 ENCOUNTER — Ambulatory Visit
Admission: RE | Admit: 2017-03-14 | Discharge: 2017-03-14 | Disposition: A | Discharge status: Home / Self Care | Payer: Medicare Other | Source: Ambulatory Visit | Attending: Family Medicine | Admitting: Family Medicine

## 2017-03-14 DIAGNOSIS — Z1231 Encounter for screening mammogram for malignant neoplasm of breast: Secondary | ICD-10-CM

## 2017-03-15 ENCOUNTER — Encounter: Payer: Self-pay | Admitting: *Deleted

## 2017-03-18 ENCOUNTER — Encounter: Payer: Self-pay | Admitting: Gastroenterology

## 2017-03-21 ENCOUNTER — Other Ambulatory Visit: Payer: Self-pay | Admitting: Family Medicine

## 2017-03-21 NOTE — Telephone Encounter (Signed)
Electronic refill request. Last office visit:   05/10/2016 Last Filled:    120 tablet 2 11/05/2016  Please advise.

## 2017-03-22 ENCOUNTER — Other Ambulatory Visit: Payer: Self-pay | Admitting: Family Medicine

## 2017-03-22 NOTE — Telephone Encounter (Signed)
Medication phoned to pharmacy.  Patient has made CPE appt.

## 2017-03-22 NOTE — Telephone Encounter (Signed)
Please call in.  Due for a physical this summer. Thanks.

## 2017-04-02 ENCOUNTER — Encounter: Payer: Self-pay | Admitting: Cardiovascular Disease

## 2017-04-02 NOTE — Telephone Encounter (Signed)
I spoke with pt. She reports BP elevated at recent dentist visits.  States she has "white coat syndrome."  BP's listed in my chart message were from yesterday and today.  Afternoon BP today was after taking Cardizem this morning.  Had not checked BP recently until yesterday.  Reports she is anxious and gets "keyed up easy."  She thinks anxiety has been worse recently.  Has prn Lorazepam. I asked pt to monitor blood pressure about 3 hours after taking Cardizem for next few days and call us or send readings through my chart on Friday.

## 2017-04-05 ENCOUNTER — Encounter: Payer: Self-pay | Admitting: Cardiovascular Disease

## 2017-04-05 ENCOUNTER — Encounter: Payer: Medicare Other | Admitting: Nurse Practitioner

## 2017-04-18 ENCOUNTER — Encounter: Payer: Medicare Other | Admitting: Nurse Practitioner

## 2017-04-28 ENCOUNTER — Encounter: Payer: Self-pay | Admitting: Nurse Practitioner

## 2017-04-28 NOTE — Progress Notes (Signed)
Electrophysiology Office Note Date: 04/30/2017  ID:  [name], DOB 1946/06/20, MRN [id number]  PCP: Tonia Ghent, MD Primary Cardiologist: Mare Ferrari ->[facility] Electrophysiologist: Rayann Heman  CC: Pacemaker follow-up  [name] is a 71 y.o. female seen today for Dr Rayann Heman.  She presents today for routine electrophysiology followup.  Since last being seen in our clinic, the patient reports doing relatively well.  She has had decreased exercise tolerance lately that she feels is due to not moving as much. She has persistent intermittent palpitations that are short lived but are unchanged in the last several years.  She denies chest pain, dyspnea, PND, orthopnea, nausea, vomiting, dizziness, syncope, edema, weight gain, or early satiety.  Device History: MDT dual chamber PPM implanted 2006 for sick sinus syndrome; gen change 2016   Past Medical History:  Diagnosis Date  . Adenomatous colon polyp 12/2006  . Allergic rhinitis   . Anemia   . Arthritis   . Bradycardia    s/p MDT PPM  . Depression   . Esophageal stricture   . GERD (gastroesophageal reflux disease)   . Hemorrhoids   . Hyperlipidemia   . Hypertension   . Inguinal hernia   . Osteopenia 12/2012   T score -1.7 FRAX 8.5%/1.1%  . PVC (premature ventricular contraction)   . Scleritis of right eye   . Skin cancer    Basal and squamous cell  . Sleep apnea    a. intolerant of CPAP  . Thyroid disease   . Vitreous detachment of both eyes    Past Surgical History:  Procedure Laterality Date  . ABDOMINAL HYSTERECTOMY    . BREAST SURGERY     Biopsy-benign  . CESAREAN SECTION    . DILATION AND CURETTAGE OF UTERUS    . ESOPHAGOGASTRODUODENOSCOPY     esophageal stretching  . HAND SURGERY  1992  . HERNIA REPAIR    . INGUINAL HERNIA REPAIR  02/12/1994   left inguinal hernia repair  . KNEE ARTHROSCOPY  2011  . OOPHORECTOMY  1995   LSO and RSO  . PACEMAKER GENERATOR CHANGE N/A 01/21/2015   Procedure: PACEMAKER  GENERATOR CHANGE;  Surgeon: [name], MD;  [facility];  Service: Cardiovascular;  Laterality: N/A;  . PACEMAKER INSERTION  2006   Dr. Verlon Setting  . POLYPECTOMY    . Tear duct surgery  2010  . THUMB SURGERY    . THYROIDECTOMY  1979   RIGHT SIDE  . TOTAL KNEE ARTHROPLASTY Right 12/20/2014   Procedure: RIGHT TOTAL KNEE ARTHROPLASTY;  Surgeon: Vickey Huger, MD;  Location: [facility];  Service: Orthopedics;  Laterality: Right;    Current Outpatient Prescriptions  Medication Sig Dispense Refill  . acetaminophen (TYLENOL) 500 MG tablet Take 1,000 mg by mouth every 6 (six) hours as needed (pain).    . Carboxymethylcell-Hypromellose (GENTEAL) 0.25-0.3 % GEL Place 1 application into both eyes at bedtime.    Marland Kitchen diltiazem (CARDIZEM CD) 120 MG 24 hr capsule TAKE ONE CAPSULE BY MOUTH ONCE DAILY 90 capsule 3  . ibuprofen (ADVIL,MOTRIN) 200 MG tablet Take 200 mg by mouth every 6 (six) hours as needed for moderate pain.    Marland Kitchen LORazepam (ATIVAN) 1 MG tablet TAKE 1 TABLET BY MOUTH 4 TIMES A DAY 120 tablet 2  . nitroGLYCERIN (NITROSTAT) 0.4 MG SL tablet Place 1 tablet (0.4 mg total) under the tongue every 5 (five) minutes as needed. For up to 3 doses 25 tablet 6  . nizatidine (AXID) 150 MG  capsule TAKE (1) CAPSULE TWICE DAILY. 60 capsule 12  . Polyethyl Glycol-Propyl Glycol (SYSTANE OP) Place 1 drop into both eyes daily as needed (dry eyes).    Marland Kitchen terconazole (TERAZOL 7) 0.4 % vaginal cream Place 1 applicator vaginally at bedtime. For 7 nights 45 g 3   No current facility-administered medications for this visit.     Allergies:   Hydrocodone; Antihistamines, loratadine-type; Bisoprolol-hydrochlorothiazide; Bystolic [nebivolol hcl]; Cephalexin; Codeine; Demerol [meperidine]; Epinephrine; Erythromycin; Hyoscyamine sulfate; Inderal [propranolol]; Lidocaine; Lipitor [atorvastatin]; Losartan potassium; Metoprolol; Morphine; Nebivolol; Oxycodone-acetaminophen; Paxil [paroxetine hcl]; Penicillins; Prednisone;  Propranolol hcl; Sertraline hcl; Tetracycline; Toprol xl [metoprolol succinate]; Verapamil; Hydralazine; and Oxycodone-acetaminophen   Social History: Social History   Social History  . Marital status: Married    Spouse name: N/A  . Number of children: 1  . Years of education: N/A   Occupational History  . Retired Unemployed   Social History Main Topics  . Smoking status: Never Smoker  . Smokeless tobacco: Never Used  . Alcohol use No  . Drug use: No  . Sexual activity: Not Currently    Birth control/ protection: Surgical, Post-menopausal     Comment: HYST-1st intercourse 71 yo-fewer than 5 partners   Other Topics Concern  . 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 Heart failure Sister   . Heart disease Sister   . Hypertension Sister   . Dementia Mother   . Hypertension Mother   . Hypertension Father   . Arthritis Father   . Breast cancer Maternal Aunt        Age 30's  . Diabetes Maternal Aunt   . Diabetes Maternal Uncle   . Ovarian cancer Maternal Aunt   . Colon cancer Neg Hx      Review of Systems: All other systems reviewed and are otherwise negative except as noted above.   Physical Exam: VS:  BP (!) 136/94   Pulse 72   Ht 5' 4.5" (1.638 m)   Wt 125 lb 6.4 oz (56.9 kg)   SpO2 96%   BMI 21.19 kg/m  , BMI Body mass index is 21.19 kg/m.  GEN- The patient is elderly appearing, alert and oriented x 3 today.   HEENT: normocephalic, atraumatic; sclera clear, conjunctiva pink; hearing intact; oropharynx clear; neck supple  Lungs- Clear to ausculation bilaterally, normal work of breathing.  No wheezes, rales, rhonchi Heart- Regular rate and rhythm  GI- soft, non-tender, non-distended, bowel sounds present  Extremities- no clubbing, cyanosis, or edema; DP/PT/radial pulses 2+  bilaterally MS- no significant deformity or atrophy Skin- warm and dry, no rash or lesion; PPM pocket well healed Psych- euthymic mood, full affect Neuro- strength and sensation are intact  PPM Interrogation- reviewed in detail today,  See PACEART report  EKG:  EKG is not ordered today.  Recent Labs: 05/10/2016: TSH 2.13   Wt Readings from Last 3 Encounters:  04/30/17 125 lb 6.4 oz (56.9 kg)  01/08/17 126 lb (57.2 kg)  05/10/16 124 lb (56.2 kg)     Other studies Reviewed: Additional studies/ records that were reviewed today include: Dr Rayann Heman and Dr Camillia Herter office notes  Assessment and Plan:  1.  Sick sinus syndrome Normal PPM function See Pace Art report No changes today  2.  HTN Stable  No change required today  3.  Palpitations Stable No change required today  4. Fatigue I have offered to check labs today, but she has an appt scheduled with her PCP in the next couple of weeks with complete physical No arrhythmias on device interrogation to explain Histograms are appropriate   Current medicines are reviewed at length with the patient today.   The patient does not have concerns regarding her medicines.  The following changes were made today:  none  Labs/ tests ordered today include: none   Disposition:   Follow up with Dr Angelena Form as scheduled, [facility], Dr Rayann Heman in 1 year    Signed, Chanetta Marshall, NP 04/30/2017 10:27 AM  [facility] [address] [place] [address] [phone] (office) [phone] (fax)

## 2017-04-30 ENCOUNTER — Ambulatory Visit (INDEPENDENT_AMBULATORY_CARE_PROVIDER_SITE_OTHER): Payer: Medicare Other | Admitting: Nurse Practitioner

## 2017-04-30 ENCOUNTER — Encounter: Payer: Self-pay | Admitting: Nurse Practitioner

## 2017-04-30 VITALS — BP 136/94 | HR 72 | Ht 64.5 in | Wt 125.4 lb

## 2017-04-30 DIAGNOSIS — R5383 Other fatigue: Secondary | ICD-10-CM

## 2017-04-30 DIAGNOSIS — I495 Sick sinus syndrome: Secondary | ICD-10-CM | POA: Diagnosis not present

## 2017-04-30 DIAGNOSIS — R002 Palpitations: Secondary | ICD-10-CM | POA: Diagnosis not present

## 2017-04-30 DIAGNOSIS — I10 Essential (primary) hypertension: Secondary | ICD-10-CM

## 2017-04-30 LAB — CUP PACEART INCLINIC DEVICE CHECK
Date Time Interrogation Session: 20180529101528
Implantable Lead Implant Date: 20060620
Implantable Lead Location: 753859
Implantable Lead Location: 753860
Implantable Lead Model: 5076
Implantable Pulse Generator Implant Date: 20160219
MDC IDC LEAD IMPLANT DT: 20060620

## 2017-04-30 NOTE — Patient Instructions (Addendum)
Medication Instructions:    Your physician recommends that you continue on your current medications as directed. Please refer to the Current Medication list given to you today.  --- If you need a refill on your cardiac medications before your next appointment, please call your pharmacy. ---  Labwork:  None ordered  Testing/Procedures:  None ordered  Follow-Up: Remote monitoring is used to monitor your Pacemaker of ICD from home. This monitoring reduces the number of office visits required to check your device to one time per year. It allows [place] to keep an eye on the functioning of your device to ensure it is working properly. You are scheduled for a device check from home on 07/30/2017. You may send your transmission at any time that day. If you have a wireless device, the transmission will be sent automatically. After your physician reviews your transmission, you will receive a postcard with your next transmission date.   Your physician wants you to follow-up in: 1 year with Dr. Rayann Heman.  You will receive a reminder letter in the mail two months in advance. If you don't receive a letter, please call our office to schedule the follow-up appointment.  Thank you for choosing [facility]!!

## 2017-05-01 ENCOUNTER — Encounter: Payer: Medicare Other | Admitting: Nurse Practitioner

## 2017-05-06 ENCOUNTER — Ambulatory Visit (INDEPENDENT_AMBULATORY_CARE_PROVIDER_SITE_OTHER): Payer: Medicare Other | Admitting: Cardiovascular Disease

## 2017-05-06 ENCOUNTER — Encounter: Payer: Self-pay | Admitting: Cardiovascular Disease

## 2017-05-06 VITALS — BP 148/80 | HR 80 | Ht 64.5 in | Wt 126.8 lb

## 2017-05-06 DIAGNOSIS — I495 Sick sinus syndrome: Secondary | ICD-10-CM

## 2017-05-06 DIAGNOSIS — E7800 Pure hypercholesterolemia, unspecified: Secondary | ICD-10-CM | POA: Diagnosis not present

## 2017-05-06 DIAGNOSIS — I10 Essential (primary) hypertension: Secondary | ICD-10-CM | POA: Diagnosis not present

## 2017-05-06 DIAGNOSIS — R002 Palpitations: Secondary | ICD-10-CM

## 2017-05-06 NOTE — Progress Notes (Signed)
Chief Complaint  Patient presents with  . Follow-up    palpitations    History of Present Illness: 71 yo female with history of sleep apnea, HLD, HTN, GERD/hiatal hernia/esopagheal stricture, symptomatic bradycardia s/p pacemaker who is here today for cardiac follow up. She had been followed by Dr. Mare Ferrari for general cardiology issues. Her pacemaker is followed by Dr. Rayann Heman. The pacemaker was changed in 2016 due to end of battery life. Echo June 2017 with normal LV function and small pericardial effusion with no tamponade. She is known to have PACs and PVCs.    She is here today for follow up. The patient denies any chest pain, dyspnea, lower extremity edema, orthopnea, PND, dizziness, near syncope or syncope. She has occasional palpitations.   Primary Care Physician: Tonia Ghent, MD   Past Medical History:  Diagnosis Date  . Adenomatous colon polyp 12/2006  . Allergic rhinitis   . Anemia   . Arthritis   . Bradycardia    s/p MDT PPM  . Depression   . Esophageal stricture   . GERD (gastroesophageal reflux disease)   . Hemorrhoids   . Hyperlipidemia   . Hypertension   . Inguinal hernia   . Osteopenia 12/2012   T score -1.7 FRAX 8.5%/1.1%  . PVC (premature ventricular contraction)   . Scleritis of right eye   . Skin cancer    Basal and squamous cell  . Sleep apnea    a. intolerant of CPAP  . Thyroid disease   . Vitreous detachment of both eyes     Past Surgical History:  Procedure Laterality Date  . ABDOMINAL HYSTERECTOMY    . BREAST SURGERY     Biopsy-benign  . CESAREAN SECTION    . DILATION AND CURETTAGE OF UTERUS    . ESOPHAGOGASTRODUODENOSCOPY     esophageal stretching  . HAND SURGERY  1992  . HERNIA REPAIR    . INGUINAL HERNIA REPAIR  02/12/1994   left inguinal hernia repair  . KNEE ARTHROSCOPY  2011  . OOPHORECTOMY  1995   LSO and RSO  . PACEMAKER GENERATOR CHANGE N/A 01/21/2015   Procedure: PACEMAKER GENERATOR CHANGE;  Surgeon: [name], MD;   Location: [facility] C[facility] LAB;  Service: Cardiovascular;  Laterality: N/A;  . PACEMAKER INSERTION  2006   Dr. Verlon Setting  . POLYPECTOMY    . Tear duct surgery  2010  . THUMB SURGERY    . THYROIDECTOMY  1979   RIGHT SIDE  . TOTAL KNEE ARTHROPLASTY Right 12/20/2014   Procedure: RIGHT TOTAL KNEE ARTHROPLASTY;  Surgeon: Vickey Huger, MD;  Location: [facility];  Service: Orthopedics;  Laterality: Right;    Current Outpatient Prescriptions  Medication Sig Dispense Refill  . acetaminophen (TYLENOL) 500 MG tablet Take 1,000 mg by mouth every 6 (six) hours as needed (pain).    . Carboxymethylcell-Hypromellose (GENTEAL) 0.25-0.3 % GEL Place 1 application into both eyes at bedtime.    Marland Kitchen diltiazem (CARDIZEM CD) 120 MG 24 hr capsule TAKE ONE CAPSULE BY MOUTH ONCE DAILY 90 capsule 3  . ibuprofen (ADVIL,MOTRIN) 200 MG tablet Take 200 mg by mouth every 6 (six) hours as needed for moderate pain.    Marland Kitchen LORazepam (ATIVAN) 1 MG tablet TAKE 1 TABLET BY MOUTH 4 TIMES A DAY 120 tablet 2  . nitroGLYCERIN (NITROSTAT) 0.4 MG SL tablet Place 1 tablet (0.4 mg total) under the tongue every 5 (five) minutes as needed. For up to 3 doses 25 tablet 6  . nizatidine (AXID) 150  MG capsule TAKE (1) CAPSULE TWICE DAILY. 60 capsule 12  . Polyethyl Glycol-Propyl Glycol (SYSTANE OP) Place 1 drop into both eyes daily as needed (dry eyes).     No current facility-administered medications for this visit.     Allergies  Allergen Reactions  . Hydrocodone     Increased HR  . Antihistamines, Loratadine-Type Other (See Comments)    Heart races/jittery  . Bisoprolol-Hydrochlorothiazide Other (See Comments)    Couldn't tolerate UNKNOWN REACTION  . Bystolic [Nebivolol Hcl] Other (See Comments)    Pt does not tolerate this med UNKNOWN REACTION  . Cephalexin Nausea And Vomiting  . Codeine Other (See Comments)    Increased heart rate, anxiety  . Demerol [Meperidine] Other (See Comments)    Increased heart rate, anxiety  . Epinephrine Other (See  Comments)    Increased heart rate  . Erythromycin Nausea And Vomiting  . Hyoscyamine Sulfate Other (See Comments)    Pt could not tolerate this med UNKNOWN reaction  . Inderal [Propranolol] Other (See Comments)    Extreme tiredness  . Lidocaine Other (See Comments)    Family hx of malignant hyperthermia  . Lipitor [Atorvastatin] Other (See Comments)    Muscle aches  . Losartan Potassium Other (See Comments)    Pt could not tolerate this med UNKNOWN REACTION  . Metoprolol Swelling  . Morphine Other (See Comments)    Nightmares, increased BP  . Nebivolol Other (See Comments)    angioedema  . Oxycodone-Acetaminophen     Other reaction(s): Tachycardia / Palpitations  (intolerance) Causes accelerated heart rate  . Paxil [Paroxetine Hcl] Other (See Comments)    "drove her up the wall"  . Penicillins Nausea And Vomiting  . Prednisone Other (See Comments)    Tolerates low doses if absolutely necessary - makes her nervous, jittery  . Propranolol Hcl Other (See Comments)    Pt could not tolerate this med UNKNOWN REACTION  . Sertraline Hcl Other (See Comments)    Pt could not tolerate this med UNKNOWN REACTION  . Tetracycline Nausea And Vomiting  . Toprol Xl [Metoprolol Succinate] Other (See Comments)    Extreme tiredness  . Verapamil Other (See Comments)    Pt could not tolerate this med UNKNOWN REACTION  . Hydralazine     headaches  . Oxycodone-Acetaminophen Palpitations    Causes accelerated heart rate    Social History   Social History  . Marital status: Married    Spouse name: N/A  . Number of children: 1  . Years of education: N/A   Occupational History  . Retired Unemployed   Social History Main Topics  . Smoking status: Never Smoker  . Smokeless tobacco: Never Used  . Alcohol use No  . Drug use: No  . Sexual activity: Not Currently    Birth control/ protection: Surgical, Post-menopausal     Comment: HYST-1st intercourse 71 yo-fewer than 5 partners    Other Topics Concern  . 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 Heart failure Sister   . Heart disease Sister   . Hypertension Sister   . Dementia Mother   . Hypertension Mother   . Hypertension Father   . Arthritis Father   . Breast cancer Maternal Aunt  Age 36's  . Diabetes Maternal Aunt   . Diabetes Maternal Uncle   . Ovarian cancer Maternal Aunt   . Colon cancer Neg Hx     Review of Systems:  As stated in the HPI and otherwise negative.   BP (!) 148/80   Pulse 80   Ht 5' 4.5" (1.638 m)   Wt 126 lb 12.8 oz (57.5 kg)   SpO2 97%   BMI 21.43 kg/m   Physical Examination:  General: Well developed, well nourished, NAD  HEENT: OP clear, mucus membranes moist  SKIN: warm, dry. No rashes. Neuro: No focal deficits  Musculoskeletal: Muscle strength 5/5 all ext  Psychiatric: Mood and affect normal  Neck: No JVD, no carotid bruits, no thyromegaly, no lymphadenopathy.  Lungs:Clear bilaterally, no wheezes, rhonci, crackles Cardiovascular: Regular rate and rhythm. No murmurs, gallops or rubs. Abdomen:Soft. Bowel sounds present. Non-tender.  Extremities: No lower extremity edema. Pulses are 2 + in the bilateral DP/PT.  Echo 05/18/16: Left ventricle: The cavity size was normal. Wall thickness was   normal. Systolic function was normal. The estimated ejection   fraction was in the range of 60% to 65%. Wall motion was normal;   there were no regional wall motion abnormalities. Doppler   parameters are consistent with abnormal left ventricular   relaxation (grade 1 diastolic dysfunction). The E/e&' ratio is   between 8-15, suggesting indeterminate LV filling pressure. - Mitral valve: Mildly thickened leaflets . There was trivial   regurgitation. - Left atrium: The atrium was normal in size. - Right  ventricle: The cavity size was normal. Wall thickness was   normal. Pacer wire or catheter noted in right ventricle. Systolic   function was normal. - Right atrium: The atrium was normal in size. Pacer wire or   catheter noted in right atrium. - Tricuspid valve: There was trivial regurgitation. - Pulmonary arteries: PA peak pressure: 22 mm Hg (S). - Inferior vena cava: The vessel was normal in size. The   respirophasic diameter changes were in the normal range (= 50%),   consistent with normal central venous pressure. - Pericardium, extracardiac: Small pericardial effusion. Features   were not consistent with tamponade physiology.  Impressions:  - LVEF 60-65%, normal wall thickness, normal wall motion, diastolic   dysfunction, indeterminate LV filling pressure, pacer wires   noted, trivial TR, normal RVSP, normal IVC, small pericardial   effusion without tamponade physiology.  EKG:  EKG is not  ordered today. The ekg ordered today demonstrates   Recent Labs: 05/10/2016: TSH 2.13   Lipid Panel    Component Value Date/Time   CHOL 206 (H) 12/19/2015 0920   TRIG 88 12/19/2015 0920   HDL 65 12/19/2015 0920   CHOLHDL 3.2 12/19/2015 0920   VLDL 18 12/19/2015 0920   LDLCALC 123 12/19/2015 0920   LDLDIRECT 146.6 01/25/2014 0953     Wt Readings from Last 3 Encounters:  05/06/17 126 lb 12.8 oz (57.5 kg)  04/30/17 125 lb 6.4 oz (56.9 kg)  01/08/17 126 lb (57.2 kg)     Other studies Reviewed: Additional studies/ records that were reviewed today include: . Review of the above records demonstrates:    Assessment and Plan:   1. HTN: BP is controlled at home but elevated today. She will follow at home and bring readings to primary care visit in several weeks. No changes.   2. HLD: Followed in primary care. Statin intolerant.   3. Symptomatic bradycardia: Pacemaker followed by Dr. Rayann Heman. Recent check with  no arrhythmias noted.   4. Palpitations/PVCs/PACs: she is still having  palpitations. Continue Cardizem.   Current medicines are reviewed at length with the patient today.  The patient does not have concerns regarding medicines.  The following changes have been made:  no change  Labs/ tests ordered today include:   Orders Placed This Encounter  Procedures  . EKG 12-Lead     Disposition:   FU with me in 12  months   Signed, Lauree Chandler, MD 05/06/2017 3:09 PM    [facility] [address], [place], [place]  [address] Phone: [phone]; Fax: [phone]

## 2017-05-06 NOTE — Patient Instructions (Signed)

## 2017-05-13 ENCOUNTER — Ambulatory Visit (INDEPENDENT_AMBULATORY_CARE_PROVIDER_SITE_OTHER): Payer: Medicare Other

## 2017-05-13 ENCOUNTER — Other Ambulatory Visit: Payer: Self-pay | Admitting: Family Medicine

## 2017-05-13 VITALS — BP 122/84 | HR 83 | Temp 98.5°F | Ht 63.75 in | Wt 126.5 lb

## 2017-05-13 DIAGNOSIS — E7800 Pure hypercholesterolemia, unspecified: Secondary | ICD-10-CM

## 2017-05-13 DIAGNOSIS — Z0000 Encounter for general adult medical examination without abnormal findings: Secondary | ICD-10-CM

## 2017-05-13 DIAGNOSIS — Z8639 Personal history of other endocrine, nutritional and metabolic disease: Secondary | ICD-10-CM

## 2017-05-13 LAB — COMPREHENSIVE METABOLIC PANEL
ALT: 16 U/L (ref 0–35)
AST: 19 U/L (ref 0–37)
Albumin: 4.4 g/dL (ref 3.5–5.2)
Alkaline Phosphatase: 48 U/L (ref 39–117)
BUN: 16 mg/dL (ref 6–23)
CHLORIDE: 100 meq/L (ref 96–112)
CO2: 29 mEq/L (ref 19–32)
Calcium: 9.6 mg/dL (ref 8.4–10.5)
Creatinine, Ser: 0.81 mg/dL (ref 0.40–1.20)
GFR: 74.41 mL/min (ref >60.00)
GLUCOSE: 84 mg/dL (ref 70–99)
POTASSIUM: 4.1 meq/L (ref 3.5–5.1)
SODIUM: 137 meq/L (ref 135–145)
TOTAL PROTEIN: 6.9 g/dL (ref 6.0–8.3)
Total Bilirubin: 0.5 mg/dL (ref 0.2–1.2)

## 2017-05-13 LAB — LIPID PANEL
Cholesterol: 217 mg/dL — ABNORMAL HIGH (ref 0–200)
HDL: 63.2 mg/dL (ref >39.00)
LDL CALC: 139 mg/dL — AB (ref 0–99)
NONHDL: 154.16
Total CHOL/HDL Ratio: 3
Triglycerides: 78 mg/dL (ref 0.0–149.0)
VLDL: 15.6 mg/dL (ref 0.0–40.0)

## 2017-05-13 LAB — TSH: TSH: 2.55 u[IU]/mL (ref 0.35–4.50)

## 2017-05-13 NOTE — Patient Instructions (Signed)
[name] , Thank you for taking time to come for your Medicare Wellness Visit. I appreciate your ongoing commitment to your health goals. Please review the following plan we discussed and let me know if I can assist you in the future.   These are the goals we discussed: Goals    . Increase physical activity          When weather permits, I attempt to exercise for at least 15 min daily.        This is a list of the screening recommended for you and due dates:  Health Maintenance  Topic Date Due  . Colon Cancer Screening  05/23/2017  . Flu Shot  07/03/2017  . Tetanus Vaccine  07/03/2017  . Mammogram  03/15/2019  . DEXA scan (bone density measurement)  Completed  .  Hepatitis C: One time screening is recommended by Center for Disease Control  (CDC) for  adults born from [birth year] through 1964.   Completed  . Pneumonia vaccines  Completed   Preventive Care for Adults  A healthy lifestyle and preventive care can promote health and wellness. Preventive health guidelines for adults include the following key practices.  . A routine yearly physical is a good way to check with your health care provider about your health and preventive screening. It is a chance to share any concerns and updates on your health and to receive a thorough exam.  . Visit your dentist for a routine exam and preventive care every 6 months. Brush your teeth twice a day and floss once a day. Good oral hygiene prevents tooth decay and gum disease.  . The frequency of eye exams is based on your age, health, family medical history, use  of contact lenses, and other factors. Follow your health care provider's ecommendations for frequency of eye exams.  . Eat a healthy diet. Foods like vegetables, fruits, whole grains, low-fat dairy products, and lean protein foods contain the nutrients you need without too many calories. Decrease your intake of foods high in solid fats, added sugars, and salt. Eat the right amount of  calories for you. Get information about a proper diet from your health care provider, if necessary.  . Regular physical exercise is one of the most important things you can do for your health. Most adults should get at least 150 minutes of moderate-intensity exercise (any activity that increases your heart rate and causes you to sweat) each week. In addition, most adults need muscle-strengthening exercises on 2 or more days a week.  Silver Sneakers may be a benefit available to you. To determine eligibility, you may visit the website: www.silversneakers.com or contact program at (986)870-3006 Mon-Fri between 8AM-8PM.   . Maintain a healthy weight. The body mass index (BMI) is a screening tool to identify possible weight problems. It provides an estimate of body fat based on height and weight. Your health care provider can find your BMI and can help you achieve or maintain a healthy weight.   For adults 20 years and older: ? A BMI below 18.5 is considered underweight. ? A BMI of 18.5 to 24.9 is normal. ? A BMI of 25 to 29.9 is considered overweight. ? A BMI of 30 and above is considered obese.   . Maintain normal blood lipids and cholesterol levels by exercising and minimizing your intake of saturated fat. Eat a balanced diet with plenty of fruit and vegetables. Blood tests for lipids and cholesterol should begin at age 62 and be  repeated every 5 years. If your lipid or cholesterol levels are high, you are over 50, or you are at high risk for heart disease, you may need your cholesterol levels checked more frequently. Ongoing high lipid and cholesterol levels should be treated with medicines if diet and exercise are not working.  . If you smoke, find out from your health care provider how to quit. If you do not use tobacco, please do not start.  . If you choose to drink alcohol, please do not consume more than 2 drinks per day. One drink is considered to be 12 ounces (355 mL) of beer, 5 ounces  (148 mL) of wine, or 1.5 ounces (44 mL) of liquor.  . If you are 57-66 years old, ask your health care provider if you should take aspirin to prevent strokes.  . Use sunscreen. Apply sunscreen liberally and repeatedly throughout the day. You should seek shade when your shadow is shorter than you. Protect yourself by wearing long sleeves, pants, a wide-brimmed hat, and sunglasses year round, whenever you are outdoors.  . Once a month, do a whole body skin exam, using a mirror to look at the skin on your back. Tell your health care provider of new moles, moles that have irregular borders, moles that are larger than a pencil eraser, or moles that have changed in shape or color.

## 2017-05-13 NOTE — Progress Notes (Signed)
PCP notes:   Health maintenance:  None  Abnormal screenings:   Hearing - failed  Patient concerns:   None  Nurse concerns:  None  Next PCP appt:   05/20/17 @ 1215

## 2017-05-13 NOTE — Progress Notes (Signed)
Subjective:   [name] is a 71 y.o. female who presents for Medicare Annual (Subsequent) preventive examination.  Review of Systems:  N/A Cardiac Risk Factors include: advanced age (>53men, >50 women)     Objective:     Vitals: BP 122/84 (BP Location: Left Arm, Patient Position: Sitting, Cuff Size: Normal)   Pulse 83   Temp 98.5 F (36.9 C) (Oral)   Ht 5' 3.75" (1.619 m) Comment: no shoes  Wt 126 lb 8 oz (57.4 kg)   SpO2 97%   BMI 21.88 kg/m   Body mass index is 21.88 kg/m.   Tobacco History  Smoking Status  . Never Smoker  Smokeless Tobacco  . Never Used     Counseling given: No   Past Medical History:  Diagnosis Date  . Adenomatous colon polyp 12/2006  . Allergic rhinitis   . Anemia   . Arthritis   . Bradycardia    s/p MDT PPM  . Depression   . Esophageal stricture   . GERD (gastroesophageal reflux disease)   . Hemorrhoids   . Hyperlipidemia   . Hypertension   . Inguinal hernia   . Osteopenia 12/2012   T score -1.7 FRAX 8.5%/1.1%  . PVC (premature ventricular contraction)   . Scleritis of right eye   . Skin cancer    Basal and squamous cell  . Sleep apnea    a. intolerant of CPAP  . Thyroid disease   . Vitreous detachment of both eyes    Past Surgical History:  Procedure Laterality Date  . ABDOMINAL HYSTERECTOMY    . BREAST SURGERY     Biopsy-benign  . CESAREAN SECTION    . DILATION AND CURETTAGE OF UTERUS    . ESOPHAGOGASTRODUODENOSCOPY     esophageal stretching  . HAND SURGERY  1992  . HERNIA REPAIR    . INGUINAL HERNIA REPAIR  02/12/1994   left inguinal hernia repair  . KNEE ARTHROSCOPY  2011  . OOPHORECTOMY  1995   LSO and RSO  . PACEMAKER GENERATOR CHANGE N/A 01/21/2015   Procedure: PACEMAKER GENERATOR CHANGE;  Surgeon: [name], MD;  Location: [facility] CATH LAB;  Service: Cardiovascular;  Laterality: N/A;  . PACEMAKER INSERTION  2006   Dr. Verlon Setting  . POLYPECTOMY    . Tear duct surgery  2010  . THUMB SURGERY    .  THYROIDECTOMY  1979   RIGHT SIDE  . TOTAL KNEE ARTHROPLASTY Right 12/20/2014   Procedure: RIGHT TOTAL KNEE ARTHROPLASTY;  Surgeon: Vickey Huger, MD;  Location: [facility];  Service: Orthopedics;  Laterality: Right;   Family History  Problem Relation Age of Onset  . Heart failure Sister   . Heart disease Sister   . Hypertension Sister   . Dementia Mother   . Hypertension Mother   . Hypertension Father   . Arthritis Father   . Breast cancer Maternal Aunt        Age 60's  . Diabetes Maternal Aunt   . Diabetes Maternal Uncle   . Ovarian cancer Maternal Aunt   . Colon cancer Neg Hx    History  Sexual Activity  . Sexual activity: Not Currently  . Birth control/ protection: Surgical, Post-menopausal    Comment: HYST-1st intercourse 71 yo-fewer than 5 partners    Outpatient Encounter Prescriptions as of 05/13/2017  Medication Sig  . acetaminophen (TYLENOL) 500 MG tablet Take 1,000 mg by mouth every 6 (six) hours as needed (pain).  . Carboxymethylcell-Hypromellose (GENTEAL) 0.25-0.3 % GEL Place  1 application into both eyes at bedtime.  Marland Kitchen diltiazem (CARDIZEM CD) 120 MG 24 hr capsule TAKE ONE CAPSULE BY MOUTH ONCE DAILY  . ibuprofen (ADVIL,MOTRIN) 200 MG tablet Take 200 mg by mouth every 6 (six) hours as needed for moderate pain.  Marland Kitchen LORazepam (ATIVAN) 1 MG tablet TAKE 1 TABLET BY MOUTH 4 TIMES A DAY  . nitroGLYCERIN (NITROSTAT) 0.4 MG SL tablet Place 1 tablet (0.4 mg total) under the tongue every 5 (five) minutes as needed. For up to 3 doses  . nizatidine (AXID) 150 MG capsule TAKE (1) CAPSULE TWICE DAILY.  Vladimir Faster Glycol-Propyl Glycol (SYSTANE OP) Place 1 drop into both eyes daily as needed (dry eyes).   No facility-administered encounter medications on file as of 05/13/2017.     Activities of Daily Living In your present state of health, do you have any difficulty performing the following activities: 05/13/2017  Hearing? N  Vision? Y  Difficulty concentrating or making decisions? N    Walking or climbing stairs? N  Dressing or bathing? N  Doing errands, shopping? N  Preparing Food and eating ? N  Using the Toilet? N  In the past six months, have you accidently leaked urine? Y  Do you have problems with loss of bowel control? N  Managing your Medications? N  Managing your Finances? N  Housekeeping or managing your Housekeeping? N  Some recent data might be hidden    Patient Care Team: Tonia Ghent, MD as PCP - General (Family Medicine) Darlin Coco, MD (Cardiology) [name], MD as Attending Physician (Cardiology) Deneise Lever, MD (Pulmonary Disease) Vickey Huger, MD (Orthopedic Surgery) Burnell Blanks, MD as Consulting Physician (Cardiology) Jarome Matin, MD as Consulting Physician (Dermatology) Phineas Real Belinda Block, MD as Consulting Physician (Gynecology) Luberta Mutter, MD as Consulting Physician (Ophthalmology)    Assessment:     Hearing Screening   125Hz  250Hz  500Hz  1000Hz  2000Hz  3000Hz  4000Hz  6000Hz  8000Hz   Right ear:   40 40 40  40    Left ear:   40 40 40  0    Vision Screening Comments: Last vision exam in Feb 2018 with Dr. Ellie Lunch   Exercise Activities and Dietary recommendations Current Exercise Habits: The patient does not participate in regular exercise at present, Exercise limited by: None identified  Goals    . Increase physical activity          When weather permits, I attempt to exercise for at least 15 min daily.       Fall Risk Fall Risk  05/13/2017 05/10/2016 05/10/2016 10/11/2014  Falls in the past year? No No No No   Depression Screen PHQ 2/9 Scores 05/13/2017 05/10/2016 05/10/2016 10/11/2014  PHQ - 2 Score 0 0 0 2  PHQ- 9 Score - - - 10     Cognitive Function MMSE - Mini Mental State Exam 05/13/2017 05/10/2016  Orientation to time 5 5  Orientation to Place 5 5  Registration 3 3  Attention/ Calculation 0 0  Recall 3 3  Language- name 2 objects 0 0  Language- repeat 1 1  Language- follow 3 step command 3  3  Language- read & follow direction 0 0  Write a sentence 0 0  Copy design 0 0  Total score 20 20     PLEASE NOTE: A Mini-Cog screen was completed. Maximum score is 20. A value of 0 denotes this part of Folstein MMSE was not completed or the patient failed this part of the Mini-Cog  screening.   Mini-Cog Screening Orientation to Time - Max 5 pts Orientation to Place - Max 5 pts Registration - Max 3 pts Recall - Max 3 pts Language Repeat - Max 1 pts Language Follow 3 Step Command - Max 3 pts     Immunization History  Administered Date(s) Administered  . Influenza Split 08/21/2012  . Influenza,inj,Quad PF,36+ Mos 09/11/2013, 08/25/2014, 09/05/2015, 09/03/2016  . Pneumococcal Conjugate-13 09/05/2015  . Pneumococcal Polysaccharide-23 10/11/2014  . Tdap 07/04/2007  . Zoster 06/04/2012   Screening Tests Health Maintenance  Topic Date Due  . COLONOSCOPY  05/23/2017  . INFLUENZA VACCINE  07/03/2017  . TETANUS/TDAP  07/03/2017  . MAMMOGRAM  03/15/2019  . DEXA SCAN  Completed  . Hepatitis C Screening  Completed  . PNA vac Low Risk Adult  Completed      Plan:     I have personally reviewed and addressed the Medicare Annual Wellness questionnaire and have noted the following in the patient's chart:  A. Medical and social history B. Use of alcohol, tobacco or illicit drugs  C. Current medications and supplements D. Functional ability and status E.  Nutritional status F.  Physical activity G. Advance directives H. List of other physicians I.  Hospitalizations, surgeries, and ER visits in previous 12 months J.  [place] to include hearing, vision, cognitive, depression L. Referrals and appointments - none  In addition, I have reviewed and discussed with patient certain preventive protocols, quality metrics, and best practice recommendations. A written personalized care plan for preventive services as well as general preventive health recommendations were provided  to patient.  See attached scanned questionnaire for additional information.   Signed,   Lindell Noe, MHA, BS, LPN Health Coach

## 2017-05-13 NOTE — Progress Notes (Signed)
Pre visit review using our clinic review tool, if applicable. No additional management support is needed unless otherwise documented below in the visit note. 

## 2017-05-13 NOTE — Progress Notes (Signed)
I reviewed health advisor's note, was available for consultation, and agree with documentation and plan.  

## 2017-05-15 ENCOUNTER — Ambulatory Visit (INDEPENDENT_AMBULATORY_CARE_PROVIDER_SITE_OTHER): Payer: Medicare Other | Admitting: Gastroenterology

## 2017-05-15 ENCOUNTER — Encounter: Payer: Self-pay | Admitting: Gastroenterology

## 2017-05-15 VITALS — BP 118/70 | HR 78 | Ht 63.75 in | Wt 126.8 lb

## 2017-05-15 DIAGNOSIS — Z86010 Personal history of colonic polyps: Secondary | ICD-10-CM

## 2017-05-15 DIAGNOSIS — K219 Gastro-esophageal reflux disease without esophagitis: Secondary | ICD-10-CM | POA: Diagnosis not present

## 2017-05-15 DIAGNOSIS — R1013 Epigastric pain: Secondary | ICD-10-CM

## 2017-05-15 DIAGNOSIS — Z860101 Personal history of adenomatous and serrated colon polyps: Secondary | ICD-10-CM

## 2017-05-15 DIAGNOSIS — K5904 Chronic idiopathic constipation: Secondary | ICD-10-CM | POA: Diagnosis not present

## 2017-05-15 MED ORDER — NA SULFATE-K SULFATE-MG SULF 17.5-3.13-1.6 GM/177ML PO SOLN: 1.0000 | Freq: Once | ORAL | 0 refills | Status: AC

## 2017-05-15 NOTE — Patient Instructions (Signed)
You can take over the counter Gas-X or Gavison as needed.   You have been scheduled for a colonoscopy. Please follow written instructions given to you at your visit today.  Please pick up your prep supplies at the pharmacy within the next 1-3 days. If you use inhalers (even only as needed), please bring them with you on the day of your procedure. Your physician has requested that you go to www.startemmi.com and enter the access code given to you at your visit today. This web site gives a general overview about your procedure. However, you should still follow specific instructions given to you by our office regarding your preparation for the procedure.  Thank you for choosing me and [facility].  Pricilla Riffle. Dagoberto Ligas., MD., Marval Regal

## 2017-05-15 NOTE — Progress Notes (Signed)
    History of Present Illness: This is a 71 year old female with GERD, esophageal stricture, constipation, intermittent epigastric pain and a personal history of adenomatous colon polyps. She is accompanied by her husband. She relates intermittent problems with very mild epigastric pain and gas. These symptoms have come and gone since her last office visit with no clear pattern except frequently noted after meals. She has intermittent difficulties with constipation when she decreases her fiber intake. Denies weight loss, diarrhea, change in stool caliber, melena, hematochezia, nausea, vomiting, dysphagia, chest pain.   Current Medications, Allergies, Past Medical History, Past Surgical History, Family History and Social History were reviewed in [employer] record.  Physical Exam: General: Well developed, well nourished, thin, no acute distress Head: Normocephalic and atraumatic Eyes:  sclerae anicteric, EOMI Ears: Normal auditory acuity Mouth: No deformity or lesions Lungs: Clear throughout to auscultation Heart: Regular rate and rhythm; no murmurs, rubs or bruits Abdomen: Soft, non tender and non distended. No masses, hepatosplenomegaly or hernias noted. Normal Bowel sounds Rectal: deferred to colonoscopy Musculoskeletal: Symmetrical with no gross deformities  Pulses:  Normal pulses noted Extremities: No clubbing, cyanosis, edema or deformities noted Neurological: Alert oriented x 4, grossly nonfocal Psychological:  Alert and cooperative. Normal mood and affect  Assessment and Recommendations:  1. Intermittent mild epigastric pain. Possible GERD or intestinal gas. Continue current regimen for GERD. Start Gas-X qid when necessary or Gaviscon qid when necessary and assess response.  2. GERD with a history of a peptic stricture. Continue nizatidine 150 mg twice a day. Follow antireflux measures.   3. Chronic idiopathic constipation. Symptoms respond well to  increased fiber intake and when she decreases her fiber intake constipation returns. Maintain high fiber intake through diet or daily Benefiber.   4. Personal history of adenomatous colon polyps due for a five-year surveillance colonoscopy. Schedule colonoscopy.. The risks (including bleeding, perforation, infection, missed lesions, medication reactions and possible hospitalization or surgery if complications occur), benefits, and alternatives to colonoscopy with possible biopsy and possible polypectomy were discussed with the patient and they consent to proceed.

## 2017-05-20 ENCOUNTER — Ambulatory Visit (INDEPENDENT_AMBULATORY_CARE_PROVIDER_SITE_OTHER): Payer: Medicare Other | Admitting: Family Medicine

## 2017-05-20 ENCOUNTER — Encounter: Payer: Self-pay | Admitting: Family Medicine

## 2017-05-20 VITALS — BP 118/70 | HR 95 | Temp 98.6°F | Ht 64.0 in | Wt 127.0 lb

## 2017-05-20 DIAGNOSIS — G4733 Obstructive sleep apnea (adult) (pediatric): Secondary | ICD-10-CM | POA: Diagnosis not present

## 2017-05-20 DIAGNOSIS — I119 Hypertensive heart disease without heart failure: Secondary | ICD-10-CM | POA: Diagnosis not present

## 2017-05-20 DIAGNOSIS — K219 Gastro-esophageal reflux disease without esophagitis: Secondary | ICD-10-CM

## 2017-05-20 DIAGNOSIS — F419 Anxiety disorder, unspecified: Secondary | ICD-10-CM

## 2017-05-20 DIAGNOSIS — Z8639 Personal history of other endocrine, nutritional and metabolic disease: Secondary | ICD-10-CM | POA: Diagnosis not present

## 2017-05-20 DIAGNOSIS — Z0000 Encounter for general adult medical examination without abnormal findings: Secondary | ICD-10-CM

## 2017-05-20 DIAGNOSIS — Z7189 Other specified counseling: Secondary | ICD-10-CM

## 2017-05-20 MED ORDER — NIZATIDINE 150 MG PO CAPS: ORAL_CAPSULE | ORAL | 12 refills | Status: DC

## 2017-05-20 NOTE — Progress Notes (Signed)
Hearing screen - failed.  She has noted difficulty in crowds.  Declined hearing aids at this point.  She has colonoscopy pending.   DXA d/w pt. She wouldn't want tx at this point, so testing deferred.   Advance directive- d/w pt.  Husband designated if patient were incapacitated.  She has mouthpiece for OSA and that is working well.  Compliant.  Minimal snoring, clearly improved.    GERD.  She adheres to a diet that helps with sx.  Compliant with meds.  No blood in stool.  She has f/u colonoscopy pending.   Hypertension:    Using medication without problems or lightheadedness: yes Chest pain with exertion:no Edema:no Short of breath:no Some fatigue noted, episodic, not consistent.  She attributed it to less exercise.   BP improved today compared to prev cards visit.    H/o hypothyroidism.  Off meds.  TSH wnl.    BZD use.  Used at bedtime, with prn use during the day.  No ADE on med.  Usually about 3 tabs taken per day.  She admits to having a sig tendency toward anxiety.  She has been on med for years.  At this point, benefit likely outweighs the risk.  D/w pt.    Meds, vitals, and allergies reviewed.   ROS: Per HPI unless specifically indicated in ROS section   GEN: nad, alert and oriented HEENT: mucous membranes moist NECK: supple w/o LA CV: rrr.  no murmur PULM: ctab, no inc wob ABD: soft, +bs EXT: no edema SKIN: no acute rash

## 2017-05-20 NOTE — Patient Instructions (Addendum)
Check with your insurance to see if they will cover the tetanus shot. Don't change your meds for now.  Update [place] as needed.  Take care.  Glad to see you.

## 2017-05-21 NOTE — Assessment & Plan Note (Signed)
She adheres to a diet that helps with sx.  Compliant with meds.  No blood in stool.  She has f/u colonoscopy pending.  Continue as is.

## 2017-05-21 NOTE — Assessment & Plan Note (Signed)
She has mouthpiece for OSA and that is working well.  Compliant.  Minimal snoring, clearly improved.

## 2017-05-21 NOTE — Assessment & Plan Note (Signed)
Off meds.  TSH wnl.  Recheck yearly.

## 2017-05-21 NOTE — Assessment & Plan Note (Signed)
BP improved today compared to prev cards visit.   Continue as is. Labs discussed with patient. She agrees. >25 minutes spent in face to face time with patient, >50% spent in counselling or coordination of care.

## 2017-05-21 NOTE — Assessment & Plan Note (Signed)
Hearing screen - failed.  She has noted difficulty in crowds.  Declined hearing aids at this point.  She has colonoscopy pending.   DXA d/w pt. She wouldn't want tx at this point, so testing deferred.

## 2017-05-21 NOTE — Assessment & Plan Note (Signed)
Advance directive- d/w pt. Husband designated if patient were incapacitated.  

## 2017-05-21 NOTE — Assessment & Plan Note (Signed)
Used at bedtime, with prn use during the day.  No ADE on med.  Usually about 3 tabs taken per day.  She admits to having a sig tendency toward anxiety.  She has been on med for years.  At this point, benefit likely outweighs the risk.  Routine cautions d/w pt, and at this point would continue med as is.

## 2017-07-09 ENCOUNTER — Telehealth: Payer: Self-pay | Admitting: Gastroenterology

## 2017-07-09 ENCOUNTER — Encounter: Payer: Self-pay | Admitting: Gastroenterology

## 2017-07-09 NOTE — Telephone Encounter (Signed)
Spoke with patient and she states she takes Advil prn and wants to be sure it is ok to continue this. Reassured patient. She also wants to make Dr. Fuller Plan aware of a family history of malignant hyperthermia. States she had an uncle and nephew that had this and she was told to always let them know if she was being sedated.

## 2017-07-11 NOTE — Telephone Encounter (Signed)
[name],  This pt is cleared for anesthetic care at [facility].  Thanks,  Osvaldo Angst

## 2017-07-23 ENCOUNTER — Encounter: Payer: Self-pay | Admitting: Gastroenterology

## 2017-07-23 ENCOUNTER — Ambulatory Visit (AMBULATORY_SURGERY_CENTER): Payer: Medicare Other | Admitting: Gastroenterology

## 2017-07-23 VITALS — BP 150/80 | HR 57 | Temp 98.9°F | Resp 26 | Ht 63.0 in | Wt 126.0 lb

## 2017-07-23 DIAGNOSIS — Z86010 Personal history of colonic polyps: Secondary | ICD-10-CM | POA: Diagnosis present

## 2017-07-23 DIAGNOSIS — D123 Benign neoplasm of transverse colon: Secondary | ICD-10-CM

## 2017-07-23 DIAGNOSIS — D120 Benign neoplasm of cecum: Secondary | ICD-10-CM | POA: Diagnosis not present

## 2017-07-23 MED ORDER — SODIUM CHLORIDE 0.9 % IV SOLN: 500.0000 mL | INTRAVENOUS | Status: DC

## 2017-07-23 NOTE — Progress Notes (Signed)
Called to room to assist during endoscopic procedure.  Patient ID and intended procedure confirmed with present staff. Received instructions for my participation in the procedure from the performing physician.  

## 2017-07-23 NOTE — Patient Instructions (Signed)
YOU HAD AN ENDOSCOPIC PROCEDURE TODAY AT THE [facility]:   Refer to the procedure report that was given to you for any specific questions about what was found during the examination.  If the procedure report does not answer your questions, please call your gastroenterologist to clarify.  If you requested that your care partner not be given the details of your procedure findings, then the procedure report has been included in a sealed envelope for you to review at your convenience later.  YOU SHOULD EXPECT: Some feelings of bloating in the abdomen. Passage of more gas than usual.  Walking can help get rid of the air that was put into your GI tract during the procedure and reduce the bloating. If you had a lower endoscopy (such as a colonoscopy or flexible sigmoidoscopy) you may notice spotting of blood in your stool or on the toilet paper. If you underwent a bowel prep for your procedure, you may not have a normal bowel movement for a few days.  Please Note:  You might notice some irritation and congestion in your nose or some drainage.  This is from the oxygen used during your procedure.  There is no need for concern and it should clear up in a day or so.  SYMPTOMS TO REPORT IMMEDIATELY:   Following lower endoscopy (colonoscopy or flexible sigmoidoscopy):  Excessive amounts of blood in the stool  Significant tenderness or worsening of abdominal pains  Swelling of the abdomen that is new, acute  Fever of 100F or higher   For urgent or emergent issues, a gastroenterologist can be reached at any hour by calling [phone].   DIET:  We do recommend a small meal at first, but then you may proceed to your regular diet.  Drink plenty of fluids but you should avoid alcoholic beverages for 24 hours.  ACTIVITY:  You should plan to take it easy for the rest of today and you should NOT DRIVE or use heavy machinery until tomorrow (because of the sedation medicines used during the test).     FOLLOW UP: Our staff will call the number listed on your records the next business day following your procedure to check on you and address any questions or concerns that you may have regarding the information given to you following your procedure. If we do not reach you, we will leave a message.  However, if you are feeling well and you are not experiencing any problems, there is no need to return our call.  We will assume that you have returned to your regular daily activities without incident.  If any biopsies were taken you will be contacted by phone or by letter within the next 1-3 weeks.  Please call us at [phone] if you have not heard about the biopsies in 3 weeks.    SIGNATURES/CONFIDENTIALITY: You and/or your care partner have signed paperwork which will be entered into your electronic medical record.  These signatures attest to the fact that that the information above on your After Visit Summary has been reviewed and is understood.  Full responsibility of the confidentiality of this discharge information lies with you and/or your care-partner.    Handouts were given to your care partner on polyps and hemorrhoids. You may resume your current medications today. Await biopsy results. Please call if any questions or concerns.   

## 2017-07-23 NOTE — Progress Notes (Signed)
No problems noted in the recovery room. maw 

## 2017-07-23 NOTE — Progress Notes (Signed)
Report to PACU, RN, vss, BBS= Clear.  

## 2017-07-23 NOTE — Op Note (Signed)
[facility] Patient Name: [name] Procedure Date: 07/23/2017 1:17 PM MRN: [id number] Endoscopist: Ladene Artist , MD Age: 71 Referring MD:  Date of Birth: 12-30-1945 Gender: Female Account #: [id number] Procedure:                Colonoscopy Indications:              Surveillance: Personal history of adenomatous                            polyps on last colonoscopy 5 years ago Medicines:                Monitored Anesthesia Care Procedure:                Pre-Anesthesia Assessment:                           - Prior to the procedure, a History and Physical                            was performed, and patient medications and                            allergies were reviewed. The patient's tolerance of                            previous anesthesia was also reviewed. The risks                            and benefits of the procedure and the sedation                            options and risks were discussed with the patient.                            All questions were answered, and informed consent                            was obtained. Prior Anticoagulants: The patient has                            taken no previous anticoagulant or antiplatelet                            agents. ASA Grade Assessment: II - A patient with                            mild systemic disease. After reviewing the risks                            and benefits, the patient was deemed in                            satisfactory condition to undergo the procedure.  After obtaining informed consent, the colonoscope                            was passed under direct vision. Throughout the                            procedure, the patient's blood pressure, pulse, and                            oxygen saturations were monitored continuously. The                            Model PCF-[id number]) scope was introduced                            through the anus and  advanced to the the cecum,                            identified by appendiceal orifice and ileocecal                            valve. The ileocecal valve, appendiceal orifice,                            and rectum were photographed. The quality of the                            bowel preparation was excellent. The colonoscopy                            was performed without difficulty. The patient                            tolerated the procedure well. Scope In: 1:44:44 PM Scope Out: 2:00:51 PM Scope Withdrawal Time: 0 hours 11 minutes 21 seconds  Total Procedure Duration: 0 hours 16 minutes 7 seconds  Findings:                 The perianal and digital rectal examinations were                            normal.                           Two sessile polyps were found in the transverse                            colon and cecum. The polyps were 5 to 7 mm in size.                            These polyps were removed with a cold snare.                            Resection and retrieval were complete.  The exam was otherwise without abnormality on                            direct and retroflexion views.                           Internal hemorrhoids were found during                            retroflexion. The hemorrhoids were small and Grade                            I (internal hemorrhoids that do not prolapse). Complications:            No immediate complications. Estimated blood loss:                            None. Estimated Blood Loss:     Estimated blood loss: none. Impression:               - Two 5 to 7 mm polyps in the transverse colon and                            in the cecum, removed with a cold snare. Resected                            and retrieved.                           - The examination was otherwise normal on direct                            and retroflexion views.                           - Internal hemorrhoids. Recommendation:            - Repeat colonoscopy in 5 years for surveillance.                           - Patient has a contact number available for                            emergencies. The signs and symptoms of potential                            delayed complications were discussed with the                            patient. Return to normal activities tomorrow.                            Written discharge instructions were provided to the                            patient.                           -  Resume previous diet.                           - Continue present medications.                           - Await pathology results. Ladene Artist, MD 07/23/2017 4:43:15 PM This report has been signed electronically.

## 2017-07-24 ENCOUNTER — Telehealth: Payer: Self-pay | Admitting: *Deleted

## 2017-07-24 ENCOUNTER — Other Ambulatory Visit: Payer: Self-pay

## 2017-07-24 NOTE — Telephone Encounter (Signed)
  Follow up Call-  Call back number 07/23/2017  Post procedure Call Back phone  # [phone]  Permission to leave phone message Yes  Some recent data might be hidden     Patient questions:  Do you have a fever, pain , or abdominal swelling? No. Pain Score  0 *  Have you tolerated food without any problems? Yes.    Have you been able to return to your normal activities? Yes.    Do you have any questions about your discharge instructions: Diet   No. Medications  No. Follow up visit  No.  Do you have questions or concerns about your Care? No.  Actions: * If pain score is 4 or above: No action needed, pain <4.

## 2017-07-24 NOTE — Telephone Encounter (Signed)
Message left on triage line: Pt needs a refill of lorazepam sent to [facility]. Last OV 05-20-17 Next OV 06-10-18.

## 2017-07-25 MED ORDER — LORAZEPAM 1 MG PO TABS: 1.0000 mg | ORAL_TABLET | Freq: Four times a day (QID) | ORAL | 2 refills | Status: DC

## 2017-07-25 NOTE — Telephone Encounter (Signed)
Please call in.  Thanks.   

## 2017-07-25 NOTE — Telephone Encounter (Signed)
Medication phoned to pharmacy.  

## 2017-07-30 ENCOUNTER — Ambulatory Visit (INDEPENDENT_AMBULATORY_CARE_PROVIDER_SITE_OTHER): Payer: Medicare Other | Admitting: *Deleted

## 2017-07-30 DIAGNOSIS — I495 Sick sinus syndrome: Secondary | ICD-10-CM | POA: Diagnosis not present

## 2017-07-30 NOTE — Progress Notes (Signed)
Remote pacemaker transmission.   

## 2017-07-31 LAB — CUP PACEART REMOTE DEVICE CHECK
Battery Remaining Longevity: 140 mo
Battery Voltage: 2.78 V
Brady Statistic AS VP Percent: 0 %
Implantable Lead Implant Date: 20060620
Implantable Lead Location: 753860
Implantable Lead Model: 5076
Implantable Pulse Generator Implant Date: 20160219
Lead Channel Impedance Value: 567 Ohm
Lead Channel Pacing Threshold Amplitude: 0.75 V
Lead Channel Pacing Threshold Pulse Width: 0.4 ms
Lead Channel Setting Pacing Amplitude: 2 V
Lead Channel Setting Pacing Pulse Width: 0.46 ms
MDC IDC LEAD IMPLANT DT: 20060620
MDC IDC LEAD LOCATION: 753859
MDC IDC MSMT BATTERY IMPEDANCE: 132 Ohm
MDC IDC MSMT LEADCHNL RA PACING THRESHOLD PULSEWIDTH: 0.4 ms
MDC IDC MSMT LEADCHNL RV IMPEDANCE VALUE: 606 Ohm
MDC IDC MSMT LEADCHNL RV PACING THRESHOLD AMPLITUDE: 0.625 V
MDC IDC SESS DTM: 20180828124850
MDC IDC SET LEADCHNL RV PACING AMPLITUDE: 2.5 V
MDC IDC SET LEADCHNL RV SENSING SENSITIVITY: 5.6 mV
MDC IDC STAT BRADY AP VP PERCENT: 0 %
MDC IDC STAT BRADY AP VS PERCENT: 79 %
MDC IDC STAT BRADY AS VS PERCENT: 21 %

## 2017-08-01 ENCOUNTER — Encounter: Payer: Self-pay | Admitting: Gastroenterology

## 2017-08-09 ENCOUNTER — Encounter: Payer: Self-pay | Admitting: Cardiology

## 2017-08-12 ENCOUNTER — Telehealth: Payer: Self-pay | Admitting: Gastroenterology

## 2017-08-12 NOTE — Telephone Encounter (Signed)
Patient reports upper abdominal pain that comes and goes.  She reports that she also can feel "something rolling under her hand". She will come and see Dr. Fuller Plan tomorrow at 2:15

## 2017-08-13 ENCOUNTER — Ambulatory Visit (INDEPENDENT_AMBULATORY_CARE_PROVIDER_SITE_OTHER): Payer: Medicare Other | Admitting: Gastroenterology

## 2017-08-13 ENCOUNTER — Encounter: Payer: Self-pay | Admitting: Gastroenterology

## 2017-08-13 ENCOUNTER — Other Ambulatory Visit (INDEPENDENT_AMBULATORY_CARE_PROVIDER_SITE_OTHER): Payer: Medicare Other

## 2017-08-13 VITALS — BP 120/70 | HR 76 | Ht 63.75 in | Wt 128.8 lb

## 2017-08-13 DIAGNOSIS — Z86010 Personal history of colonic polyps: Secondary | ICD-10-CM

## 2017-08-13 DIAGNOSIS — K219 Gastro-esophageal reflux disease without esophagitis: Secondary | ICD-10-CM

## 2017-08-13 DIAGNOSIS — R1011 Right upper quadrant pain: Secondary | ICD-10-CM

## 2017-08-13 DIAGNOSIS — R1010 Upper abdominal pain, unspecified: Secondary | ICD-10-CM

## 2017-08-13 DIAGNOSIS — R1012 Left upper quadrant pain: Secondary | ICD-10-CM

## 2017-08-13 DIAGNOSIS — K5904 Chronic idiopathic constipation: Secondary | ICD-10-CM | POA: Diagnosis not present

## 2017-08-13 LAB — CBC WITH DIFFERENTIAL/PLATELET
BASOS ABS: 0 10*3/uL (ref 0.0–0.1)
Basophils Relative: 0.8 % (ref 0.0–3.0)
EOS ABS: 0 10*3/uL (ref 0.0–0.7)
EOS PCT: 0.9 % (ref 0.0–5.0)
HCT: 39.7 % (ref 36.0–46.0)
HEMOGLOBIN: 13.4 g/dL (ref 12.0–15.0)
Lymphocytes Relative: 26.2 % (ref 12.0–46.0)
Lymphs Abs: 1.1 10*3/uL (ref 0.7–4.0)
MCHC: 33.8 g/dL (ref 30.0–36.0)
MCV: 95.9 fl (ref 78.0–100.0)
MONO ABS: 0.4 10*3/uL (ref 0.1–1.0)
Monocytes Relative: 8.2 % (ref 3.0–12.0)
Neutro Abs: 2.8 10*3/uL (ref 1.4–7.7)
Neutrophils Relative %: 63.9 % (ref 43.0–77.0)
Platelets: 214 10*3/uL (ref 150.0–400.0)
RBC: 4.14 Mil/uL (ref 3.87–5.11)
RDW: 12.8 % (ref 11.5–15.5)
WBC: 4.3 10*3/uL (ref 4.0–10.5)

## 2017-08-13 LAB — COMPREHENSIVE METABOLIC PANEL
ALBUMIN: 4.5 g/dL (ref 3.5–5.2)
ALK PHOS: 52 U/L (ref 39–117)
ALT: 12 U/L (ref 0–35)
AST: 15 U/L (ref 0–37)
BUN: 18 mg/dL (ref 6–23)
CO2: 29 mEq/L (ref 19–32)
Calcium: 9.1 mg/dL (ref 8.4–10.5)
Chloride: 99 mEq/L (ref 96–112)
Creatinine, Ser: 0.88 mg/dL (ref 0.40–1.20)
GFR: 67.57 mL/min (ref >60.00)
Glucose, Bld: 91 mg/dL (ref 70–99)
POTASSIUM: 3.8 meq/L (ref 3.5–5.1)
SODIUM: 136 meq/L (ref 135–145)
TOTAL PROTEIN: 7 g/dL (ref 6.0–8.3)
Total Bilirubin: 0.4 mg/dL (ref 0.2–1.2)

## 2017-08-13 LAB — LIPASE: LIPASE: 32 U/L (ref 11.0–59.0)

## 2017-08-13 MED ORDER — DICYCLOMINE HCL 10 MG PO CAPS: 10.0000 mg | ORAL_CAPSULE | Freq: Three times a day (TID) | ORAL | 11 refills | Status: DC

## 2017-08-13 NOTE — Progress Notes (Signed)
    History of Present Illness: This is a 71 year old female with constipation, GERD who complains of upper abdominal pain and a RUQ lesion. She is accompanied by her husband. She has frequently noticed abdominal bloating and intestinal gas over the past year and a half. She also has had mild upper abdominal discomfort at times. He notes frequent rumbling in her abdomen. She is reluctant to take laxatives for constipation although it has been a long-term problem. She noted worsening problems with upper abdominal pain this weekend and began palpating her upper abdomen after taking a deep breath. She states she thought she felt a lesion or mass in her right upper quadrant just below her costal margin. Denies weight loss, diarrhea, change in stool caliber, melena, hematochezia, nausea, vomiting, dysphagia, chest pain.   Current Medications, Allergies, Past Medical History, Past Surgical History, Family History and Social History were reviewed in [employer] record.  Physical Exam: General: Well developed, well nourished, no acute distress Head: Normocephalic and atraumatic Eyes:  sclerae anicteric, EOMI Ears: Normal auditory acuity Mouth: No deformity or lesions Lungs: Clear throughout to auscultation Heart: Regular rate and rhythm; no murmurs, rubs or bruits Abdomen: Soft, non tender and non distended. No masses, hepatosplenomegaly or hernias noted. Liver edge is palpable after deep inspiration. Liver span is approximately 10-12 cm by scratch. Normal Bowel sounds. Rectal: Deferred, recent colonoscopy. Musculoskeletal: Symmetrical with no gross deformities  Pulses:  Normal pulses noted Extremities: No clubbing, cyanosis, edema or deformities noted Neurological: Alert oriented x 4, grossly nonfocal Psychological:  Alert and cooperative. Normal mood and affect  Assessment and Recommendations:  1. Constipation, upper abdominal pain, bloating. She likely palpated her liver  edge which is a benign finding however will schedule abdominal/pelvic CT to further evaluate. A high fiber diet with adequate daily water intake is recommended. She is encouraged to take MiraLAX once or twice daily as needed for management of constipation. Check CBC, CMP and lipase.  Dicyclomine 10 mg 3 times a day when necessary.   2. GERD with a history of a peptic stricture. Continue nizatidine 150 mg twice daily and standard antireflux measures.  3. Personal history of adenomatous and serrated sessile colon polyps. Five-year interval surveillance colonoscopy is recommended in August 2023.

## 2017-08-13 NOTE — Patient Instructions (Signed)
Your physician has requested that you go to the basement for lab work before leaving today.  We have sent the following medications to your pharmacy for you to pick up at your convenience: Bentyl.  Start over the counter Miralax mixing 17 grams in 8 oz of water 1-2 x daily.   You have been scheduled for a CT scan of the abdomen and pelvis at [facility] ([address]---this is in the same building as [facility]).   You are scheduled on 08/20/17 at 1:30pm. You should arrive 15 minutes prior to your appointment time for registration. Please follow the written instructions below on the day of your exam:  WARNING: IF YOU ARE ALLERGIC TO IODINE/X-RAY DYE, PLEASE NOTIFY RADIOLOGY IMMEDIATELY AT [phone]! YOU WILL BE GIVEN A 13 HOUR PREMEDICATION PREP.  1) Do not eat or drink anything after 9:30am (4 hours prior to your test) 2) You have been given 2 bottles of oral contrast to drink. The solution may taste               better if refrigerated, but do NOT add ice or any other liquid to this solution. Shake             well before drinking.    Drink 1 bottle of contrast @ 11:30am (2 hours prior to your exam)  Drink 1 bottle of contrast @ 12:30pm (1 hour prior to your exam)  You may take any medications as prescribed with a small amount of water except for the following: Metformin, Glucophage, Glucovance, Avandamet, Riomet, Fortamet, Actoplus Met, Janumet, Glumetza or Metaglip. The above medications must be held the day of the exam AND 48 hours after the exam.  The purpose of you drinking the oral contrast is to aid in the visualization of your intestinal tract. The contrast solution may cause some diarrhea. Before your exam is started, you will be given a small amount of fluid to drink. Depending on your individual set of symptoms, you may also receive an intravenous injection of x-ray contrast/dye. Plan on being at [facility] for 30 minutes or longer, depending on the  type of exam you are having performed.  This test typically takes 30-45 minutes to complete.  If you have any questions regarding your exam or if you need to reschedule, you may call the CT department at [phone] between the hours of 8:00 am and 5:00 pm, Monday-Friday.  ________________________________________________________________________  Normal BMI (Body Mass Index- based on height and weight) is between 23 and 30. Your BMI today is Body mass index is 22.28 kg/m. Marland Kitchen Please consider follow up  regarding your BMI with your Primary Care Provider.   Thank you for choosing me and [facility].  Pricilla Riffle. Dagoberto Ligas., MD., Marval Regal

## 2017-08-20 ENCOUNTER — Ambulatory Visit (INDEPENDENT_AMBULATORY_CARE_PROVIDER_SITE_OTHER)
Admission: RE | Admit: 2017-08-20 | Discharge: 2017-08-20 | Disposition: A | Discharge status: Home / Self Care | Payer: Medicare Other | Source: Ambulatory Visit | Attending: Gastroenterology | Admitting: Gastroenterology

## 2017-08-20 DIAGNOSIS — R1010 Upper abdominal pain, unspecified: Secondary | ICD-10-CM | POA: Diagnosis not present

## 2017-08-20 MED ORDER — IOPAMIDOL (ISOVUE-300) INJECTION 61%
100.0000 mL | Freq: Once | INTRAVENOUS | Status: AC | PRN
  Administered 2017-08-20: 100 mL via INTRAVENOUS

## 2017-08-22 ENCOUNTER — Telehealth: Payer: Self-pay | Admitting: Cardiovascular Disease

## 2017-08-22 NOTE — Telephone Encounter (Signed)
Remote transmission received. Presenting rhythm: ApVs. No episodes recorded. Stable lead measurements. Normal device function.  Will forward information to Leodis Liverpool, RN and [place].

## 2017-08-22 NOTE — Telephone Encounter (Signed)
I spoke with pt who reports last evening she was in kitchen and suddenly felt dizzy as if she was going to black out.  She did not fall but held onto counter and sat down.  Checked blood pressure at that time and readings below are listed.  BP came down to 116/78 at 11 PM last night. Today around noon her BP was 140/87 in one arm and 141/91 in the other arm.  Around 2 PM BP was 127/80. Had CT Scan on Tuesday which she reports was OK.  Had to drink 2 bottles of contrast prior to CT. No other symptoms at time of dizzy spell.  Dizziness has not returned. I asked pt to send device transmission and to continue to monitor BP.  Will send to Dr. Angelena Form for review/recommendations.

## 2017-08-22 NOTE — Telephone Encounter (Signed)
New message   Pt c/o BP issue: STAT if pt c/o blurred vision, one-sided weakness or slurred speech  1. What are your last 5 BP readings? 155/101 pulse 81, 159/99 pulse 70, 162/98 pulse 83, 142/89 pulse 84  2. Are you having any other symptoms (ex. Dizziness, headache, blurred vision, passed out)? Dizzy last night but OK now  3. What is your BP issue? Pt states last night she had a dizzy spell and felt like she was going to pass out and after 45 minutes they took her bp and it was running high for her

## 2017-08-23 NOTE — Telephone Encounter (Signed)
I would have her push po fluids. Her BP was ok during dizzy spells and we know she had no arythmias. Nothing else for now. Thanks, chris

## 2017-08-23 NOTE — Telephone Encounter (Signed)
I spoke with pt and gave her information from Dr. Angelena Form and told her no episodes noted on device transmission.

## 2017-08-23 NOTE — Telephone Encounter (Signed)
Anything needed regarding episode of dizziness?

## 2017-08-23 NOTE — Telephone Encounter (Signed)
It sounds like her device is ok. Is there anything else I need to address? Gerald Stabs

## 2017-09-03 ENCOUNTER — Ambulatory Visit (INDEPENDENT_AMBULATORY_CARE_PROVIDER_SITE_OTHER): Payer: Medicare Other

## 2017-09-03 DIAGNOSIS — Z23 Encounter for immunization: Secondary | ICD-10-CM

## 2017-09-05 ENCOUNTER — Ambulatory Visit: Payer: Medicare Other

## 2017-09-26 ENCOUNTER — Encounter (HOSPITAL_COMMUNITY): Payer: Self-pay | Admitting: Emergency Medicine

## 2017-09-26 ENCOUNTER — Emergency Department (HOSPITAL_COMMUNITY): Payer: Medicare Other

## 2017-09-26 ENCOUNTER — Inpatient Hospital Stay (HOSPITAL_COMMUNITY): Payer: Medicare Other

## 2017-09-26 ENCOUNTER — Inpatient Hospital Stay (HOSPITAL_COMMUNITY)
Admission: EM | Admit: 2017-09-26 | Discharge: 2017-09-28 | DRG: 066 | Disposition: A | Discharge status: Home / Self Care | Payer: Medicare Other | Attending: Neurosurgery | Admitting: Neurosurgery

## 2017-09-26 DIAGNOSIS — Z833 Family history of diabetes mellitus: Secondary | ICD-10-CM | POA: Diagnosis not present

## 2017-09-26 DIAGNOSIS — F419 Anxiety disorder, unspecified: Secondary | ICD-10-CM | POA: Diagnosis present

## 2017-09-26 DIAGNOSIS — M47812 Spondylosis without myelopathy or radiculopathy, cervical region: Secondary | ICD-10-CM | POA: Diagnosis present

## 2017-09-26 DIAGNOSIS — Z86010 Personal history of colonic polyps: Secondary | ICD-10-CM | POA: Diagnosis not present

## 2017-09-26 DIAGNOSIS — M8580 Other specified disorders of bone density and structure, unspecified site: Secondary | ICD-10-CM | POA: Diagnosis present

## 2017-09-26 DIAGNOSIS — E785 Hyperlipidemia, unspecified: Secondary | ICD-10-CM | POA: Diagnosis present

## 2017-09-26 DIAGNOSIS — S065X9A Traumatic subdural hemorrhage with loss of consciousness of unspecified duration, initial encounter: Secondary | ICD-10-CM | POA: Diagnosis present

## 2017-09-26 DIAGNOSIS — Z8261 Family history of arthritis: Secondary | ICD-10-CM

## 2017-09-26 DIAGNOSIS — Z8249 Family history of ischemic heart disease and other diseases of the circulatory system: Secondary | ICD-10-CM | POA: Diagnosis not present

## 2017-09-26 DIAGNOSIS — Z950 Presence of cardiac pacemaker: Secondary | ICD-10-CM | POA: Diagnosis not present

## 2017-09-26 DIAGNOSIS — Z884 Allergy status to anesthetic agent status: Secondary | ICD-10-CM

## 2017-09-26 DIAGNOSIS — I495 Sick sinus syndrome: Secondary | ICD-10-CM | POA: Diagnosis present

## 2017-09-26 DIAGNOSIS — Z881 Allergy status to other antibiotic agents status: Secondary | ICD-10-CM | POA: Diagnosis not present

## 2017-09-26 DIAGNOSIS — M1990 Unspecified osteoarthritis, unspecified site: Secondary | ICD-10-CM | POA: Diagnosis present

## 2017-09-26 DIAGNOSIS — Z885 Allergy status to narcotic agent status: Secondary | ICD-10-CM

## 2017-09-26 DIAGNOSIS — G4733 Obstructive sleep apnea (adult) (pediatric): Secondary | ICD-10-CM | POA: Diagnosis present

## 2017-09-26 DIAGNOSIS — R51 Headache: Secondary | ICD-10-CM | POA: Diagnosis present

## 2017-09-26 DIAGNOSIS — Z85828 Personal history of other malignant neoplasm of skin: Secondary | ICD-10-CM

## 2017-09-26 DIAGNOSIS — E890 Postprocedural hypothyroidism: Secondary | ICD-10-CM | POA: Diagnosis present

## 2017-09-26 DIAGNOSIS — K219 Gastro-esophageal reflux disease without esophagitis: Secondary | ICD-10-CM | POA: Diagnosis present

## 2017-09-26 DIAGNOSIS — Z96651 Presence of right artificial knee joint: Secondary | ICD-10-CM | POA: Diagnosis present

## 2017-09-26 DIAGNOSIS — Z90710 Acquired absence of both cervix and uterus: Secondary | ICD-10-CM | POA: Diagnosis not present

## 2017-09-26 DIAGNOSIS — I119 Hypertensive heart disease without heart failure: Secondary | ICD-10-CM | POA: Diagnosis present

## 2017-09-26 DIAGNOSIS — Z79899 Other long term (current) drug therapy: Secondary | ICD-10-CM

## 2017-09-26 DIAGNOSIS — I6200 Nontraumatic subdural hemorrhage, unspecified: Secondary | ICD-10-CM | POA: Diagnosis not present

## 2017-09-26 DIAGNOSIS — L659 Nonscarring hair loss, unspecified: Secondary | ICD-10-CM | POA: Diagnosis present

## 2017-09-26 DIAGNOSIS — F329 Major depressive disorder, single episode, unspecified: Secondary | ICD-10-CM | POA: Diagnosis present

## 2017-09-26 DIAGNOSIS — Z880 Allergy status to penicillin: Secondary | ICD-10-CM

## 2017-09-26 DIAGNOSIS — S065XAA Traumatic subdural hemorrhage with loss of consciousness status unknown, initial encounter: Secondary | ICD-10-CM | POA: Diagnosis present

## 2017-09-26 DIAGNOSIS — Z888 Allergy status to other drugs, medicaments and biological substances status: Secondary | ICD-10-CM

## 2017-09-26 LAB — CBC WITH DIFFERENTIAL/PLATELET
BASOS ABS: 0 10*3/uL (ref 0.0–0.1)
BASOS PCT: 0 %
EOS ABS: 0 10*3/uL (ref 0.0–0.7)
Eosinophils Relative: 0 %
HCT: 36.9 % (ref 36.0–46.0)
HEMOGLOBIN: 12.6 g/dL (ref 12.0–15.0)
Lymphocytes Relative: 13 %
Lymphs Abs: 0.9 10*3/uL (ref 0.7–4.0)
MCH: 31.5 pg (ref 26.0–34.0)
MCHC: 34.1 g/dL (ref 30.0–36.0)
MCV: 92.3 fL (ref 78.0–100.0)
MONO ABS: 0.3 10*3/uL (ref 0.1–1.0)
MONOS PCT: 4 %
NEUTROS PCT: 83 %
Neutro Abs: 5.3 10*3/uL (ref 1.7–7.7)
Platelets: 174 10*3/uL (ref 150–400)
RBC: 4 MIL/uL (ref 3.87–5.11)
RDW: 12.3 % (ref 11.5–15.5)
WBC: 6.5 10*3/uL (ref 4.0–10.5)

## 2017-09-26 LAB — BASIC METABOLIC PANEL
ANION GAP: 9 (ref 5–15)
BUN: 12 mg/dL (ref 6–20)
CALCIUM: 8.8 mg/dL — AB (ref 8.9–10.3)
CO2: 25 mmol/L (ref 22–32)
CREATININE: 0.74 mg/dL (ref 0.44–1.00)
Chloride: 98 mmol/L — ABNORMAL LOW (ref 101–111)
GFR calc non Af Amer: >60 mL/min (ref >60)
Glucose, Bld: 129 mg/dL — ABNORMAL HIGH (ref 65–99)
Potassium: 3.9 mmol/L (ref 3.5–5.1)
SODIUM: 132 mmol/L — AB (ref 135–145)

## 2017-09-26 LAB — TYPE AND SCREEN
ABO/RH(D): O POS
Antibody Screen: NEGATIVE

## 2017-09-26 LAB — MRSA PCR SCREENING: MRSA by PCR: NEGATIVE

## 2017-09-26 LAB — PROTIME-INR
INR: 1.02
PROTHROMBIN TIME: 13.3 s (ref 11.4–15.2)

## 2017-09-26 LAB — ABO/RH: ABO/RH(D): O POS

## 2017-09-26 LAB — APTT: aPTT: 29 seconds (ref 24–36)

## 2017-09-26 MED ORDER — LORAZEPAM 1 MG PO TABS
1.0000 mg | ORAL_TABLET | Freq: Four times a day (QID) | ORAL | Status: DC | PRN
  Administered 2017-09-26 – 2017-09-28 (×7): 1 mg via ORAL
  Filled 2017-09-26 (×7): qty 1

## 2017-09-26 MED ORDER — POLYVINYL ALCOHOL 1.4 % OP SOLN
1.0000 [drp] | Freq: Every day | OPHTHALMIC | Status: DC
Start: 2017-09-26 — End: 2017-09-28
  Filled 2017-09-26: qty 15

## 2017-09-26 MED ORDER — ACETAMINOPHEN 650 MG RE SUPP: 650.0000 mg | RECTAL | Status: DC | PRN

## 2017-09-26 MED ORDER — ONDANSETRON HCL 4 MG/2ML IJ SOLN
4.0000 mg | Freq: Once | INTRAMUSCULAR | Status: AC
  Administered 2017-09-26: 4 mg via INTRAVENOUS
  Filled 2017-09-26: qty 2

## 2017-09-26 MED ORDER — FAMOTIDINE 20 MG PO TABS
20.0000 mg | ORAL_TABLET | Freq: Two times a day (BID) | ORAL | Status: DC
  Administered 2017-09-26 – 2017-09-28 (×5): 20 mg via ORAL
  Filled 2017-09-26 (×5): qty 1

## 2017-09-26 MED ORDER — IOPAMIDOL (ISOVUE-370) INJECTION 76%
INTRAVENOUS | Status: AC
  Administered 2017-09-26: 50 mL
  Filled 2017-09-26: qty 50

## 2017-09-26 MED ORDER — TRAMADOL HCL 50 MG PO TABS
50.0000 mg | ORAL_TABLET | Freq: Four times a day (QID) | ORAL | Status: DC | PRN
  Administered 2017-09-26 (×2): 50 mg via ORAL
  Administered 2017-09-26: 25 mg via ORAL
  Administered 2017-09-27 – 2017-09-28 (×4): 50 mg via ORAL
  Filled 2017-09-26 (×7): qty 1

## 2017-09-26 MED ORDER — ACETAMINOPHEN 160 MG/5ML PO SOLN: 650.0000 mg | ORAL | Status: DC | PRN

## 2017-09-26 MED ORDER — POTASSIUM CHLORIDE IN NACL 20-0.9 MEQ/L-% IV SOLN
INTRAVENOUS | Status: DC
  Administered 2017-09-26 – 2017-09-27 (×3): via INTRAVENOUS
  Filled 2017-09-26 (×5): qty 1000

## 2017-09-26 MED ORDER — ACETAMINOPHEN 325 MG PO TABS
650.0000 mg | ORAL_TABLET | ORAL | Status: DC | PRN
  Administered 2017-09-28: 650 mg via ORAL
  Filled 2017-09-26: qty 2

## 2017-09-26 MED ORDER — DILTIAZEM HCL ER COATED BEADS 120 MG PO CP24
120.0000 mg | ORAL_CAPSULE | Freq: Every day | ORAL | Status: DC
  Administered 2017-09-26 – 2017-09-28 (×3): 120 mg via ORAL
  Filled 2017-09-26 (×3): qty 1

## 2017-09-26 MED ORDER — TRAMADOL HCL 50 MG PO TABS
25.0000 mg | ORAL_TABLET | Freq: Once | ORAL | Status: AC
  Administered 2017-09-26: 25 mg via ORAL
  Filled 2017-09-26: qty 1

## 2017-09-26 NOTE — H&P (Signed)
Subjective: Patient is a 71 y.o. right-handed white female who is admitted for treatment of left hemispheric acute subdural hematoma.  Patient has a long history of occipital headache and posterior neck pain, and has been found previously to have cervical spondylosis and degenerative disc disease. Earlier this evening she got up to go to the bathroom and had sudden onset of pronounced left hemicranial headache which extended to the left side of her face including the jaw, sinuses, and orbit. She took some Advil and rest it, but then when she got up later to wash her face, the pain recurred. Her husband checked her blood pressure which was somewhat elevated, and they came to the [facility] emergency room for evaluation.  Workup in the emergency room was done by Dr. Pryor Curia.  CT of the brain revealed a moderate-sized left hemispheric subdural hematoma about 11 mm in maximal thickness, extending down to the temporal fossa and up to the parietal vertex; no evidence of subarachnoid hemorrhage is seen; there is about 5 mm midline shift. CT of the cervical spine reconfirms cervical spondylosis and degenerative disease. Laboratory show normal PT, PTT, and platelet count.  Symptomatically the patient denies any nausea, vomiting, diplopia, blurred vision, weakness, seizures, or any history of trauma, particularly to her head.  Past medical history is notable for hypertension, cardiac arrhythmia for which she had a pacemaker placed 13 years ago (it has been replaced once), GERD, and hiatal hernia. She's had numerous surgeries, but none involving the cranium or cervical spine. She doesn't smoke or drink alcoholic beverages. She has numerous intolerances to medications, but is not clear that she actually has any allergies to medications   Patient Active Problem List   Diagnosis Date Noted  . Subdural hematoma ([place]) 09/26/2017  . Health care maintenance 05/10/2016  . History of thyroid disease  05/10/2016  . Osteoarthritis of neck 05/10/2016  . Scar tissue 09/06/2015  . S/P total knee arthroplasty 12/20/2014  . Cough 11/21/2014  . Medicare annual wellness visit, initial 10/12/2014  . Advance care planning 10/12/2014  . Chest pain 05/19/2014  . Palpitations 09/23/2013  . Fatigue 08/07/2013  . Alopecia 05/11/2013  . Benign hypertensive heart disease without heart failure 01/07/2013  . Pacemaker-[employer] 10/08/2012  . Hiatal hernia 05/28/2012  . Esophageal stricture 05/28/2012  . Dysuria 04/03/2012  . Osteoporosis screening 01/16/2012  . Breast mass 01/16/2012  . Anxiety 01/16/2012  . Sick sinus syndrome ([place])   . Osteoarthritis of knee 04/19/2011  . Internal hemorrhoids 02/28/2011  . History of colon polyps 02/28/2011  . Constipation 02/28/2011  . GERD 06/28/2009  . CHEST PAIN 06/28/2009  . COLONIC POLYPS, ADENOMATOUS, HX OF 06/24/2009  . Hypercholesterolemia 03/30/2008  . Obstructive sleep apnea 03/30/2008  . History of SCC (squamous cell carcinoma) of skin 03/30/2008   Past Medical History:  Diagnosis Date  . Adenomatous colon polyp 12/2006  . Allergic rhinitis   . Anemia   . Arthritis   . Bradycardia    s/p MDT PPM  . Depression   . Esophageal stricture   . GERD (gastroesophageal reflux disease)   . Hemorrhoids   . Hyperlipidemia   . Hypertension   . Inguinal hernia   . Osteopenia 12/2012   T score -1.7 FRAX 8.5%/1.1%  . PVC (premature ventricular contraction)   . Scleritis of right eye   . Skin cancer    Basal and squamous cell  . Sleep apnea    a. intolerant of CPAP  . Thyroid  disease   . Vitreous detachment of both eyes     Past Surgical History:  Procedure Laterality Date  . ABDOMINAL HYSTERECTOMY    . BREAST SURGERY     Biopsy-benign  . CESAREAN SECTION    . DILATION AND CURETTAGE OF UTERUS    . ESOPHAGOGASTRODUODENOSCOPY     esophageal stretching  . HAND SURGERY  1992  . HERNIA REPAIR    . INGUINAL HERNIA REPAIR  02/12/1994   left  inguinal hernia repair  . KNEE ARTHROSCOPY  2011  . OOPHORECTOMY  1995   LSO and RSO  . PACEMAKER GENERATOR CHANGE N/A 01/21/2015   Procedure: PACEMAKER GENERATOR CHANGE;  Surgeon: [name], MD;  Location: [facility] CATH LAB;  Service: Cardiovascular;  Laterality: N/A;  . PACEMAKER INSERTION  2006   Dr. Verlon Setting  . POLYPECTOMY    . Tear duct surgery  2010  . THUMB SURGERY    . THYROIDECTOMY  1979   RIGHT SIDE  . TOTAL KNEE ARTHROPLASTY Right 12/20/2014   Procedure: RIGHT TOTAL KNEE ARTHROPLASTY;  Surgeon: Vickey Huger, MD;  Location: [facility];  Service: Orthopedics;  Laterality: Right;     (Not in a hospital admission) Allergies  Allergen Reactions  . Hydrocodone     Increased HR  . Antihistamines, Loratadine-Type Other (See Comments)    Heart races/jittery  . Bisoprolol-Hydrochlorothiazide Other (See Comments)    Couldn't tolerate UNKNOWN REACTION  . Bystolic [Nebivolol Hcl] Other (See Comments)    Pt does not tolerate this med UNKNOWN REACTION  . Cephalexin Nausea And Vomiting  . Codeine Other (See Comments)    Increased heart rate, anxiety  . Demerol [Meperidine] Other (See Comments)    Increased heart rate, anxiety  . Epinephrine Other (See Comments)    Increased heart rate  . Erythromycin Nausea And Vomiting  . Hyoscyamine Sulfate Other (See Comments)    Pt could not tolerate this med UNKNOWN reaction  . Inderal [Propranolol] Other (See Comments)    Extreme tiredness  . Lidocaine Other (See Comments)    Family hx of malignant hyperthermia  . Lipitor [Atorvastatin] Other (See Comments)    Muscle aches  . Losartan Potassium Other (See Comments)    Pt could not tolerate this med UNKNOWN REACTION  . Metoprolol Swelling  . Morphine Other (See Comments)    Nightmares, increased BP  . Nebivolol Other (See Comments)    angioedema  . Oxycodone-Acetaminophen     Other reaction(s): Tachycardia / Palpitations  (intolerance) Causes accelerated heart rate  . Paxil [Paroxetine  Hcl] Other (See Comments)    "drove her up the wall"  . Penicillins Nausea And Vomiting  . Prednisone Other (See Comments)    Tolerates low doses if absolutely necessary - makes her nervous, jittery  . Propranolol Hcl Other (See Comments)    Pt could not tolerate this med UNKNOWN REACTION  . Sertraline Hcl Other (See Comments)    Pt could not tolerate this med UNKNOWN REACTION  . Tetracycline Nausea And Vomiting  . Toprol Xl [Metoprolol Succinate] Other (See Comments)    Extreme tiredness  . Verapamil Other (See Comments)    Pt could not tolerate this med UNKNOWN REACTION  . Hydralazine     headaches  . Oxycodone-Acetaminophen Palpitations    Causes accelerated heart rate    Social History  Substance Use Topics  . Smoking status: Never Smoker  . Smokeless tobacco: Never Used  . Alcohol use No    Family History  Problem Relation  Age of Onset  . Heart failure Sister   . Heart disease Sister   . Hypertension Sister   . Dementia Mother   . Hypertension Mother   . Hypertension Father   . Arthritis Father   . Breast cancer Maternal Aunt        Age 14's  . Diabetes Maternal Aunt   . Diabetes Maternal Uncle   . Ovarian cancer Maternal Aunt   . Colon cancer Neg Hx      Review of Systems Pertinent items noted in HPI and remainder of comprehensive ROS otherwise negative.  Objective: Vital signs in last 24 hours: Temp:  [98 F (36.7 C)] 98 F (36.7 C) (10/25 0114) Pulse Rate:  [55-77] 70 (10/25 0515) Resp:  [16] 16 (10/25 0114) BP: (134-165)/(69-93) 159/82 (10/25 0515) SpO2:  [95 %-100 %] 98 % (10/25 0515) Weight:  [56.7 kg (125 lb)] 56.7 kg (125 lb) (10/25 0116)  EXAM: Patient is a well-developed well-nourished white female in no acute distress. Neck is supple. Lungs are clear to auscultation , the patient has symmetrical respiratory excursion. Heart has a regular rate and rhythm normal S1 and S2 no murmur.   Abdomen is soft nontender nondistended bowel sounds are  present. Extremity examination shows no clubbing cyanosis or edema. Neurologic examination: Mental status examination:  Patient awake and alert, she is oriented to her name, October 2018, and [facility] Her speech is fluent, she has good comprehension, she follows commands briskly. Cranial nerve examination:  Pupils are 3 mm bilaterally, round, reactive to light. Extraocular movements are intact. Facial sensation is intact. Facial movement is symmetrical. Hearing is present bilaterally. Palatal movement is symmetrical. Shoulder shrug is symmetrical. Tongue is midline. Motor examination:  5/5 strength in the upper and lower extremities. No drift of the upper extremities. Sensory examination:  Intact to pinprick in the upper and lower extremities. Reflex examination:  Symmetrical in the upper and lower extremities.  Data Review:CBC    Component Value Date/Time   WBC 6.5 09/26/2017 0425   RBC 4.00 09/26/2017 0425   HGB 12.6 09/26/2017 0425   HCT 36.9 09/26/2017 0425   PLT 174 09/26/2017 0425   MCV 92.3 09/26/2017 0425   MCH 31.5 09/26/2017 0425   MCHC 34.1 09/26/2017 0425   RDW 12.3 09/26/2017 0425   LYMPHSABS 0.9 09/26/2017 0425   MONOABS 0.3 09/26/2017 0425   EOSABS 0.0 09/26/2017 0425   BASOSABS 0.0 09/26/2017 0425                          BMET    Component Value Date/Time   NA 132 (L) 09/26/2017 0425   K 3.9 09/26/2017 0425   CL 98 (L) 09/26/2017 0425   CO2 25 09/26/2017 0425   GLUCOSE 129 (H) 09/26/2017 0425   BUN 12 09/26/2017 0425   CREATININE 0.74 09/26/2017 0425   CREATININE 0.86 12/19/2015 0920   CALCIUM 8.8 (L) 09/26/2017 0425   GFRNONAA >60 09/26/2017 0425   GFRAA >60 09/26/2017 0425    CT Brain and Cervical Spine:  CLINICAL DATA:  Severe worst headache of life. Pain radiates into LEFT head, face and neck. History of hypertension.  EXAM: CT HEAD WITHOUT CONTRAST  CT CERVICAL SPINE WITHOUT CONTRAST  TECHNIQUE: Multidetector CT imaging  of the head and cervical spine was performed following the standard protocol without intravenous contrast. Multiplanar CT image reconstructions of the cervical spine were also generated.  COMPARISON:  CT cervical  spine December 19, 2009  FINDINGS: CT HEAD FINDINGS  BRAIN: Dense LEFT holo hemispheric subdural hematoma measuring to 11 mm. 3 mm LEFT parafalcine subdural hematoma. 5 mm LEFT-to-RIGHT midline shift. No hydrocephalus or ventricular entrapment. No intraparenchymal hemorrhage. Punctate dural LEFT frontal calcification. No acute large vascular territory infarcts. Basal cisterns are patent.  VASCULAR: Mild calcific atherosclerosis of the carotid siphons.  SKULL: No skull fracture. Severe bilateral temporomandibular osteoarthrosis. No significant scalp soft tissue swelling.  SINUSES/ORBITS: The mastoid air-cells and included paranasal sinuses are well-aerated.The included ocular globes and orbital contents are non-suspicious.  OTHER: Cardiac pacemaker in the included chest.  CT CERVICAL SPINE FINDINGS  ALIGNMENT: Straightened lordosis. Minimal grade 1 C2-3 anterolisthesis.  SKULL BASE AND VERTEBRAE: Cervical vertebral bodies and posterior elements are intact. Severe C5-6 disc height loss and endplate sclerosis compatible with degenerative disc, moderate C3-4. C1-2 articulation maintained with mild arthropathy. Heterogeneous bone mineral density with rounded density C3, unchanged from 2011. Multilevel severe upper RIGHT cervical facet arthropathy.  SOFT TISSUES AND SPINAL CANAL: Nonacute. Mild calcific atherosclerosis carotid bifurcations. Enlarged heterogeneous LEFT thyroid, status post RIGHT thyroidectomy. No dominant nodule. 6 mm LEFT thyroid nodule below size followup recommendation.  DISC LEVELS: No significant osseous canal stenosis. Severe LEFT C5-6 neural foraminal narrowing. Moderate RIGHT C5-6 and LEFT C4-5 neural foraminal narrowing.  UPPER  CHEST: Lung apices are clear.  OTHER: None.  IMPRESSION: CT HEAD:  1. Acute 11 mm LEFT holo hemispheric subdural hematoma, 3 mm LEFT parafalcine component. 2. 5 mm LEFT-to-RIGHT midline shift.  No ventricular entrapment. CT CERVICAL SPINE:  1. No acute fracture. Minimal grade 1 C2-3 anterolisthesis on degenerative basis. 2. Severe LEFT C5-6 neural foraminal narrowing.  Critical Value/emergent results were called by telephone at the time of interpretation on 09/26/2017 at 4:19 am to Dr. Pryor Curia , who verbally acknowledged these results.   Electronically Signed   By: Elon Alas M.D.   On: 09/26/2017 04:23   Assessment/Plan: Patient presented with sudden onset of left hemicranial headache, severe enough to come to the emergency room, and who has been found to have a moderate-sized left hemispheric acute subdural hematoma, about 11 mm in maximal thickness, with about 5 mm of midline shift. There is no history of trauma. There is no evidence of subarachnoid hemorrhage, and her neck is supple. She is neurologically intact on examination. Laboratories show normal platelet count and coagulation profile.  I've discussed the situation with Dr. Leonides Schanz (EDP), as well as with the patient and her husband at length. I reviewed the CT images with the patient's husband. I've recommended admission to the neurosurgical intensive care unit for observation and care. I've recommended cerebral arteriography to rule out cerebral aneurysm and vascular malformation. I have explained that craniotomy for evacuation of subdural hematoma may become necessary, but is not urgent at this time. The patient and her husband understand the seriousness of her condition, and the importance of proceeding with admission and further workup. Their questions regarding her condition and her plans were answered for them.  Hosie Spangle, MD 09/26/2017 5:41 AM

## 2017-09-26 NOTE — Progress Notes (Signed)
Subjective: Patient resting comfortably, CTA completed. I reviewed the results with Dr. Acquanetta Chain from [facility] by phone. He reports that it is in excellent quality CTA, and that there is no evidence of aneurysm, arteriovenous malformation, or other vascular lesion. He does note that the subdural hematoma is essentially unchanged in size, with the same thickness and midline shift.  Objective: Vital signs in last 24 hours: Vitals:   09/26/17 1600 09/26/17 1700 09/26/17 1800 09/26/17 1900  BP: (!) 155/76 139/83 (!) 156/70 (!) 143/80  Pulse: 60 65 69 (!) 56  Resp: 12 19 16 17   Temp: 98.7 F (37.1 C)     TempSrc: Oral     SpO2: 98% 98% 97% 96%  Weight:      Height:       Physical Exam:  Awake and alert, oriented. Following commands. Speech fluent. Moving all 4 extremities well.  Assessment/Plan: Patient remains neurologically stable. Negative CT of the brain is very encouraging. The cause of this patient's nontraumatic subdural hematoma remains elusive. I spoke with the patient and her husband regarding the results of the CTA. At this point I feel that we can treat her symptomatically. We can begin a diet and advance it as tolerated. I explained that we may never know the cause of her subdural hematoma. I further explained that if she remains stable she will probably be able to be discharged sometime over the next few days, with follow-up with me in the office with an updated CT the brain without contrast in about a week and a half. If that CT remained stable she'll follow up with me again in 4 weeks or so. Their questions regarding the CTA findings, her condition, and her plans for treatment and care were answered for them.   Hosie Spangle, MD 09/26/2017, 7:32 PM

## 2017-09-26 NOTE — ED Provider Notes (Signed)
TIME SEEN: 3:07 AM  CHIEF COMPLAINT: Headache, neck pain  HPI: Patient is a 71 year old female with history of hypertension, hyperlipidemia, osteopenia, chronic neck pain from degenerative disc disease and chronic headaches who presents to the emergency department with complaints of headache and neck pain.  States that she was having her normal posterior headache which she feels is coming from her neck tonight.  She states when she got up to walk to the bathroom she had severe, sudden onset pain that radiated into the left side of her face.  This occurred around 10 PM.  Pain improves at rest but then worsens again with getting up and walking around.  No head injury.  No fever.  No numbness or focal weakness.  No vision or speech changes.  She is not on antiplatelets or anticoagulants.  She denies bowel or bladder incontinence.  No urinary retention.  She states she has seen Dr. Arnoldo Morale in the past and reports steroids have helped her pain but they also make her have palpitations.  She states normally she takes Tylenol or Advil at home.  These medications did not relieve her pain however tonight.  ROS: See HPI Constitutional: no fever  Eyes: no drainage  ENT: no runny nose   Cardiovascular:  no chest pain  Resp: no SOB  GI: no vomiting GU: no dysuria Integumentary: no rash  Allergy: no hives  Musculoskeletal: no leg swelling  Neurological: no slurred speech ROS otherwise negative  PAST MEDICAL HISTORY/PAST SURGICAL HISTORY:  Past Medical History:  Diagnosis Date  . Adenomatous colon polyp 12/2006  . Allergic rhinitis   . Anemia   . Arthritis   . Bradycardia    s/p MDT PPM  . Depression   . Esophageal stricture   . GERD (gastroesophageal reflux disease)   . Hemorrhoids   . Hyperlipidemia   . Hypertension   . Inguinal hernia   . Osteopenia 12/2012   T score -1.7 FRAX 8.5%/1.1%  . PVC (premature ventricular contraction)   . Scleritis of right eye   . Skin cancer    Basal and  squamous cell  . Sleep apnea    a. intolerant of CPAP  . Thyroid disease   . Vitreous detachment of both eyes     MEDICATIONS:  Prior to Admission medications   Medication Sig Start Date End Date Taking? Authorizing Provider  acetaminophen (TYLENOL) 500 MG tablet Take 1,000 mg by mouth every 6 (six) hours as needed (pain).   Yes [provider]  Carboxymethylcell-Hypromellose (GENTEAL) 0.25-0.3 % GEL Place 1 application into both eyes at bedtime.   Yes [provider]  diltiazem (CARDIZEM CD) 120 MG 24 hr capsule TAKE ONE CAPSULE BY MOUTH ONCE DAILY 10/15/16  Yes Burnell Blanks, MD  ibuprofen (ADVIL,MOTRIN) 200 MG tablet Take 200 mg by mouth every 6 (six) hours as needed for moderate pain.   Yes [provider]  LORazepam (ATIVAN) 1 MG tablet Take 1 tablet (1 mg total) by mouth 4 (four) times daily. Patient taking differently: Take 1 mg by mouth 4 (four) times daily as needed for anxiety.  07/25/17  Yes Tonia Ghent, MD  nitroGLYCERIN (NITROSTAT) 0.4 MG SL tablet Place 1 tablet (0.4 mg total) under the tongue every 5 (five) minutes as needed. For up to 3 doses 04/27/16  Yes Burnell Blanks, MD  nizatidine (AXID) 150 MG capsule TAKE (1) CAPSULE TWICE DAILY. 05/20/17  Yes Tonia Ghent, MD  Polyethyl Glycol-Propyl Glycol (SYSTANE OP) Place  1 drop into both eyes daily as needed (dry eyes).   Yes [provider]    ALLERGIES:  Allergies  Allergen Reactions  . Hydrocodone     Increased HR  . Antihistamines, Loratadine-Type Other (See Comments)    Heart races/jittery  . Bisoprolol-Hydrochlorothiazide Other (See Comments)    Couldn't tolerate UNKNOWN REACTION  . Bystolic [Nebivolol Hcl] Other (See Comments)    Pt does not tolerate this med UNKNOWN REACTION  . Cephalexin Nausea And Vomiting  . Codeine Other (See Comments)    Increased heart rate, anxiety  . Demerol [Meperidine] Other (See Comments)    Increased heart rate, anxiety   . Epinephrine Other (See Comments)    Increased heart rate  . Erythromycin Nausea And Vomiting  . Hyoscyamine Sulfate Other (See Comments)    Pt could not tolerate this med UNKNOWN reaction  . Inderal [Propranolol] Other (See Comments)    Extreme tiredness  . Lidocaine Other (See Comments)    Family hx of malignant hyperthermia  . Lipitor [Atorvastatin] Other (See Comments)    Muscle aches  . Losartan Potassium Other (See Comments)    Pt could not tolerate this med UNKNOWN REACTION  . Metoprolol Swelling  . Morphine Other (See Comments)    Nightmares, increased BP  . Nebivolol Other (See Comments)    angioedema  . Oxycodone-Acetaminophen     Other reaction(s): Tachycardia / Palpitations  (intolerance) Causes accelerated heart rate  . Paxil [Paroxetine Hcl] Other (See Comments)    "drove her up the wall"  . Penicillins Nausea And Vomiting  . Prednisone Other (See Comments)    Tolerates low doses if absolutely necessary - makes her nervous, jittery  . Propranolol Hcl Other (See Comments)    Pt could not tolerate this med UNKNOWN REACTION  . Sertraline Hcl Other (See Comments)    Pt could not tolerate this med UNKNOWN REACTION  . Tetracycline Nausea And Vomiting  . Toprol Xl [Metoprolol Succinate] Other (See Comments)    Extreme tiredness  . Verapamil Other (See Comments)    Pt could not tolerate this med UNKNOWN REACTION  . Hydralazine     headaches  . Oxycodone-Acetaminophen Palpitations    Causes accelerated heart rate    SOCIAL HISTORY:  Social History  Substance Use Topics  . Smoking status: Never Smoker  . Smokeless tobacco: Never Used  . Alcohol use No    FAMILY HISTORY: Family History  Problem Relation Age of Onset  . Heart failure Sister   . Heart disease Sister   . Hypertension Sister   . Dementia Mother   . Hypertension Mother   . Hypertension Father   . Arthritis Father   . Breast cancer Maternal Aunt        Age 13's  . Diabetes Maternal  Aunt   . Diabetes Maternal Uncle   . Ovarian cancer Maternal Aunt   . Colon cancer Neg Hx     EXAM: BP (!) 165/93 (BP Location: Right Arm)   Pulse 70   Temp 98 F (36.7 C) (Oral)   Resp 16   Ht 5\' 4"  (1.626 m)   Wt 56.7 kg (125 lb)   SpO2 96%   BMI 21.46 kg/m  CONSTITUTIONAL: Alert and oriented and responds appropriately to questions. Well-appearing; well-nourished HEAD: Normocephalic EYES: Conjunctivae clear, pupils appear equal, EOMI ENT: normal nose; moist mucous membranes NECK: Supple, no meningismus, no nuchal rigidity, no LAD, tender to palpation over the upper cervical spine without step-off or  deformity CARD: RRR; S1 and S2 appreciated; no murmurs, no clicks, no rubs, no gallops RESP: Normal chest excursion without splinting or tachypnea; breath sounds clear and equal bilaterally; no wheezes, no rhonchi, no rales, no hypoxia or respiratory distress, speaking full sentences ABD/GI: Normal bowel sounds; non-distended; soft, non-tender, no rebound, no guarding, no peritoneal signs, no hepatosplenomegaly BACK:  The back appears normal and is non-tender to palpation, there is no CVA tenderness EXT: Normal ROM in all joints; non-tender to palpation; no edema; normal capillary refill; no cyanosis, no calf tenderness or swelling    SKIN: Normal color for age and race; warm; no rash NEURO: Moves all extremities equally; Strength 5/5 in all four extremities.  Normal sensation diffusely.  CN 2-12 grossly intact.   Normal speech.  Normal gait. PSYCH: The patient's mood and manner are appropriate. Grooming and personal hygiene are appropriate.  MEDICAL DECISION MAKING: Patient here with complaints of headache and neck pain.  Symptoms suddenly worsened tonight around 10 PM.  Will obtain CT of her head and cervical spine.  Discussed with patient that this could be a subarachnoid hemorrhage given sudden onset worsening pain.  She is now only having a mild headache and neck pain consistent with  her chronic pain.  She is neurologically intact.  No fever.  Low suspicion for meningitis, stroke, cervical myelopathy, epidural abscess or hematoma, discitis, transverse myelitis, osteomyelitis.  Patient is very apprehensive about steroids although she has stated they have helped her pain before.  She does agree to half a tablet of tramadol for pain control.  She states she does not tolerate pain medications well but she has taken tramadol at home previously for a knee replacement and tolerated this medication.  ED PROGRESS: 4:20 AM  Discussed with radiology.  Patient has a large left subdural hematoma with approximately 5 mm of shift.  She denies any known injury.  She is not on antiplatelets or anticoagulants.  Will discuss with neurosurgery on call.   4:38 AM  D/w Dr. Sherwood Gambler neurosurgery.  He will see the patient in the emergency department for further disposition.  Appreciate his help.  Labs pending.  We will keep patient NPO.  Patient and husband have been updated.   5:30 AM  Dr. Sherwood Gambler to admit patient.  Appreciate NSG help.  Will perform arteriogram.  I reviewed all nursing notes, vitals, pertinent previous records, EKGs, lab and urine results, imaging (as available).    CRITICAL CARE Performed by: Nyra Jabs   Total critical care time: 45 minutes  Critical care time was exclusive of separately billable procedures and treating other patients.  Critical care was necessary to treat or prevent imminent or life-threatening deterioration.  Critical care was time spent personally by me on the following activities: development of treatment plan with patient and/or surrogate as well as nursing, discussions with consultants, evaluation of patient's response to treatment, examination of patient, obtaining history from patient or surrogate, ordering and performing treatments and interventions, ordering and review of laboratory studies, ordering and review of radiographic studies, pulse  oximetry and re-evaluation of patient's condition.    Ward, Delice Bison, DO 09/26/17 [phone]

## 2017-09-26 NOTE — Care Management Note (Signed)
Case Management Note  Patient Details  Name: SHATERICA MCCLATCHY MRN: [id number] Date of Birth: February 17, 1946  Subjective/Objective:  Pt admitted on 09/26/17 with moderate-sized left hemispheric acute subdural hematoma, about 11 mm in maximal thickness, with about 5 mm of midline shift.  PTA, pt independent, lives with spouse.                   Action/Plan: Pt may need transfer to area facility for endovascular treatment.  Will assist with transfer as needed.     Expected Discharge Date:                  Expected Discharge Plan:     In-House Referral:     Discharge planning Services  CM Consult  Post Acute Care Choice:    Choice offered to:     DME Arranged:    DME Agency:     HH Arranged:    HH Agency:     Status of Service:  In process, will continue to follow  If discussed at Long Length of Stay Meetings, dates discussed:    Additional Comments:  Ella Bodo, RN 09/26/2017, 3:51 PM

## 2017-09-26 NOTE — Progress Notes (Signed)
Subjective: Patient resting in bed, some mild left hemicranial discomfort, some posterior occipital and nuchal discomfort.  Objective: Vital signs in last 24 hours: Vitals:   09/26/17 0800 09/26/17 0900 09/26/17 1000 09/26/17 1155  BP: (!) 155/82 (!) 142/77 (!) 172/88   Pulse: (!) 58 (!) 55 69   Resp: 14 17 11    Temp: 98.3 F (36.8 C)   97.9 F (36.6 C)  TempSrc: Oral   Oral  SpO2: 100% 97% 100%   Weight:      Height:        Intake/Output from previous day: No intake/output data recorded. Intake/Output this shift: Total I/O In: 335 [I.V.:335] Out: -   Physical Exam:  Awake and alert, oriented. Following commands. Speech fluent. Moving all 4 extremities well.  CBC  Recent Labs  09/26/17 0425  WBC 6.5  HGB 12.6  HCT 36.9  PLT 174   BMET  Recent Labs  09/26/17 0425  NA 132*  K 3.9  CL 98*  CO2 25  GLUCOSE 129*  BUN 12  CREATININE 0.74  CALCIUM 8.8*   ABG No results found for: PHART, PCO2ART, PO2ART, HCO3, TCO2, ACIDBASEDEF, O2SAT  Studies/Results: Ct Head Wo Contrast  Result Date: 09/26/2017 CLINICAL DATA:  Severe worst headache of life. Pain radiates into LEFT head, face and neck. History of hypertension. EXAM: CT HEAD WITHOUT CONTRAST CT CERVICAL SPINE WITHOUT CONTRAST TECHNIQUE: Multidetector CT imaging of the head and cervical spine was performed following the standard protocol without intravenous contrast. Multiplanar CT image reconstructions of the cervical spine were also generated. COMPARISON:  CT cervical spine December 19, 2009 FINDINGS: CT HEAD FINDINGS BRAIN: Dense LEFT holo hemispheric subdural hematoma measuring to 11 mm. 3 mm LEFT parafalcine subdural hematoma. 5 mm LEFT-to-RIGHT midline shift. No hydrocephalus or ventricular entrapment. No intraparenchymal hemorrhage. Punctate dural LEFT frontal calcification. No acute large vascular territory infarcts. Basal cisterns are patent. VASCULAR: Mild calcific atherosclerosis of the carotid siphons.  SKULL: No skull fracture. Severe bilateral temporomandibular osteoarthrosis. No significant scalp soft tissue swelling. SINUSES/ORBITS: The mastoid air-cells and included paranasal sinuses are well-aerated.The included ocular globes and orbital contents are non-suspicious. OTHER: Cardiac pacemaker in the included chest. CT CERVICAL SPINE FINDINGS ALIGNMENT: Straightened lordosis. Minimal grade 1 C2-3 anterolisthesis. SKULL BASE AND VERTEBRAE: Cervical vertebral bodies and posterior elements are intact. Severe C5-6 disc height loss and endplate sclerosis compatible with degenerative disc, moderate C3-4. C1-2 articulation maintained with mild arthropathy. Heterogeneous bone mineral density with rounded density C3, unchanged from 2011. Multilevel severe upper RIGHT cervical facet arthropathy. SOFT TISSUES AND SPINAL CANAL: Nonacute. Mild calcific atherosclerosis carotid bifurcations. Enlarged heterogeneous LEFT thyroid, status post RIGHT thyroidectomy. No dominant nodule. 6 mm LEFT thyroid nodule below size followup recommendation. DISC LEVELS: No significant osseous canal stenosis. Severe LEFT C5-6 neural foraminal narrowing. Moderate RIGHT C5-6 and LEFT C4-5 neural foraminal narrowing. UPPER CHEST: Lung apices are clear. OTHER: None. IMPRESSION: CT HEAD: 1. Acute 11 mm LEFT holo hemispheric subdural hematoma, 3 mm LEFT parafalcine component. 2. 5 mm LEFT-to-RIGHT midline shift.  No ventricular entrapment. CT CERVICAL SPINE: 1. No acute fracture. Minimal grade 1 C2-3 anterolisthesis on degenerative basis. 2. Severe LEFT C5-6 neural foraminal narrowing. Critical Value/emergent results were called by telephone at the time of interpretation on 09/26/2017 at 4:19 am to Dr. Pryor Curia , who verbally acknowledged these results. Electronically Signed   By: Elon Alas M.D.   On: 09/26/2017 04:23   Ct Cervical Spine Wo Contrast  Result Date: 09/26/2017 CLINICAL DATA:  Severe worst headache of life. Pain radiates  into LEFT head, face and neck. History of hypertension. EXAM: CT HEAD WITHOUT CONTRAST CT CERVICAL SPINE WITHOUT CONTRAST TECHNIQUE: Multidetector CT imaging of the head and cervical spine was performed following the standard protocol without intravenous contrast. Multiplanar CT image reconstructions of the cervical spine were also generated. COMPARISON:  CT cervical spine December 19, 2009 FINDINGS: CT HEAD FINDINGS BRAIN: Dense LEFT holo hemispheric subdural hematoma measuring to 11 mm. 3 mm LEFT parafalcine subdural hematoma. 5 mm LEFT-to-RIGHT midline shift. No hydrocephalus or ventricular entrapment. No intraparenchymal hemorrhage. Punctate dural LEFT frontal calcification. No acute large vascular territory infarcts. Basal cisterns are patent. VASCULAR: Mild calcific atherosclerosis of the carotid siphons. SKULL: No skull fracture. Severe bilateral temporomandibular osteoarthrosis. No significant scalp soft tissue swelling. SINUSES/ORBITS: The mastoid air-cells and included paranasal sinuses are well-aerated.The included ocular globes and orbital contents are non-suspicious. OTHER: Cardiac pacemaker in the included chest. CT CERVICAL SPINE FINDINGS ALIGNMENT: Straightened lordosis. Minimal grade 1 C2-3 anterolisthesis. SKULL BASE AND VERTEBRAE: Cervical vertebral bodies and posterior elements are intact. Severe C5-6 disc height loss and endplate sclerosis compatible with degenerative disc, moderate C3-4. C1-2 articulation maintained with mild arthropathy. Heterogeneous bone mineral density with rounded density C3, unchanged from 2011. Multilevel severe upper RIGHT cervical facet arthropathy. SOFT TISSUES AND SPINAL CANAL: Nonacute. Mild calcific atherosclerosis carotid bifurcations. Enlarged heterogeneous LEFT thyroid, status post RIGHT thyroidectomy. No dominant nodule. 6 mm LEFT thyroid nodule below size followup recommendation. DISC LEVELS: No significant osseous canal stenosis. Severe LEFT C5-6 neural  foraminal narrowing. Moderate RIGHT C5-6 and LEFT C4-5 neural foraminal narrowing. UPPER CHEST: Lung apices are clear. OTHER: None. IMPRESSION: CT HEAD: 1. Acute 11 mm LEFT holo hemispheric subdural hematoma, 3 mm LEFT parafalcine component. 2. 5 mm LEFT-to-RIGHT midline shift.  No ventricular entrapment. CT CERVICAL SPINE: 1. No acute fracture. Minimal grade 1 C2-3 anterolisthesis on degenerative basis. 2. Severe LEFT C5-6 neural foraminal narrowing. Critical Value/emergent results were called by telephone at the time of interpretation on 09/26/2017 at 4:19 am to Dr. Pryor Curia , who verbally acknowledged these results. Electronically Signed   By: Elon Alas M.D.   On: 09/26/2017 04:23    Assessment/Plan: Patient with acute left hemispheric subdural hematoma. She remains neurologically stable, and in no acute distress. I have worked since the patient's admission to arrange for a cerebral arteriogram to rule out a ruptured cerebral aneurysm as the cause of her acute subdural hematoma. Unfortunately both of the cranial endovascular interventionalists (Drs. Kathee Delton and Kathyrn Sheriff) at [facility] are not available.  I did speak with Dr. Kathyrn Sheriff who recommended that I consult with Dr. Marlise Eves (another cranial endovascular interventionalist) at [facility] in [place], [place].  He suggested that prior to transfer we obtain a CT angiogram to rule out a left carotid aneurysm. He feels that if the CTA is good quality, then no further workup may be necessary. However if it is of poor quality, or if an aneurysm is seen and the patient needs endovascular treatment, he would be willing to accept the patient transfer. I've discussed this all with the patient and her husband, and they've agreed to proceed with the CTA, and we'll make further recommendation is after having an opportunity to review that together. Their questions regarding her condition and our plans for further  treatment, evaluation, and care were answered for them.   Hosie Spangle, MD 09/26/2017, 2:00 PM

## 2017-09-26 NOTE — ED Triage Notes (Signed)
Pt c/o headache and neck pain that she has been unable to control with Advil and tylenol x 1 week, hx chronic neck pain and headaches. Pt reports elevated BP at home. A&O x 4, ambulatory with steady gait.

## 2017-09-26 NOTE — Progress Notes (Signed)
Pt for possible transfer to tertiary facility later today for IR procedure.  Transfer packet has been prepared and placed in chart at front desk, along with copied radiology films on disc.    Should pt need transfer after hours, will need to notify [facility] for transport.  Phone: [phone].    MD will need to complete dc summary and EMTALA form in EPIC.   Medical necessity form will need to be completed by bedside RN, once transfer information is known.    [facility] MUST have accepting MD name (first and last) and room # pt is going to prior to arranging transport.    Reinaldo Raddle, RN, BSN  Trauma/Neuro ICU Case Manager [phone]

## 2017-09-26 NOTE — Progress Notes (Signed)
[facility] Physician-Brief Progress Note Patient Name: [name] DOB: 1946-09-27 MRN: [id number]   Date of Service  09/26/2017  HPI/Events of Note  Subdural hematoma  eICU Interventions  Follow up neuro surgery recs     Intervention Category Evaluation Type: New Patient Evaluation  Flora Lipps 09/26/2017, 6:58 AM

## 2017-09-26 NOTE — ED Notes (Signed)
Pt returned to room from CT

## 2017-09-27 MED ORDER — TRAMADOL HCL 50 MG PO TABS: 50.0000 mg | ORAL_TABLET | Freq: Four times a day (QID) | ORAL | 0 refills | Status: DC | PRN

## 2017-09-27 NOTE — Progress Notes (Signed)
Subjective: Patient with mild posterior headache and neck discomfort, even more minimal left hemicranial discomfort. Has received tramadol about every 8 hours as needed.  Objective: Vital signs in last 24 hours: Vitals:   09/27/17 0400 09/27/17 0500 09/27/17 0600 09/27/17 0700  BP: 119/68 133/71 130/73 138/79  Pulse: (!) 55 (!) 56 (!) 55 66  Resp: 17 17 14  (!) 21  Temp: 97.9 F (36.6 C)     TempSrc: Oral     SpO2: 96% 96% 96% 98%  Weight:      Height:        Intake/Output from previous day: 10/25 0701 - 10/26 0700 In: 2435 [I.V.:2435] Out: -  Intake/Output this shift: No intake/output data recorded.  Physical Exam:  Awake and alert, oriented. Pupils equal, round, reactive to light. EOMI. Facial movements symmetrical. Palatal movements symmetrical. Shoulder shrug symmetrical. Tongue midline. Following commands. Moving all 4 extremities well. No drift of upper extremities.   Studies/Results: Ct Angio Head W Or Wo Contrast  Result Date: 09/26/2017 CLINICAL DATA:  Initial evaluation for acute subdural hemorrhage. Evaluate for intracranial aneurysm or vascular malformation. EXAM: CT ANGIOGRAPHY HEAD TECHNIQUE: Multidetector CT imaging of the head was performed using the standard protocol during bolus administration of intravenous contrast. Multiplanar CT image reconstructions and MIPs were obtained to evaluate the vascular anatomy. CONTRAST:  50 cc of Isovue 370. COMPARISON:  Prior CT from earlier the same day. FINDINGS: CT HEAD Brain: Left holo hemispheric subdural hematoma again seen relatively stable in size measuring up to 10 mm to 11 mm in maximal thickness, stable. Small parafalcine component measuring up to 3 mm again noted. No evidence for significant interval hemorrhage. Similar mass effect on the subjacent left cerebral hemisphere with 5 mm of left-to-right shift. No hydrocephalus or ventricular trapping. Basilar cisterns remain patent. No new intracranial hemorrhage. No evidence  for acute large vessel territory infarct. No mass lesion. Vascular: No hyperdense vessel. Scattered vascular calcifications noted within the carotid siphons. Skull: Scalp soft tissues and calvarium within normal limits. Sinuses: Paranasal sinuses and mastoid air cells are clear. Orbits: Globes and oval soft tissues within normal limits. CTA HEAD Anterior circulation: Distal cervical segments of the internal carotid arteries are patent with antegrade flow. Petrous segments widely patent bilaterally. Mild scattered atheromatous plaque within the cavernous ICAs without flow-limiting stenosis. ICA termini widely patent. A1 segments patent bilaterally. Left A1 segment dominant. Anterior communicating artery normal. Anterior cerebral artery is widely patent to their distal aspects without stenosis. M1 segments patent without stenosis or occlusion. Normal MCA bifurcations. No proximal M2 stenosis or occlusion. Posterior circulation: Vertebral arteries patent to the vertebrobasilar junction without stenosis. Left vertebral artery dominant. Posterior inferior cerebral arteries patent bilaterally. Basilar artery widely patent to its distal aspect. Superior cerebral arteries patent bilaterally. Both of the posterior cerebral artery supplied via the basilar artery and are widely patent to their distal aspects. Venous sinuses: Grossly patent, although evaluation limited by timing of the contrast bolus. Anatomic variants: None significant. No aneurysm or vascular malformation. Delayed phase: No pathologic enhancement. IMPRESSION: 1. Negative CTA of the head and neck. No intracranial aneurysm or other vascular abnormality to explain the patient's subdural hemorrhage identified. 2. Mild carotid siphon atherosclerosis without flow-limiting stenosis. 3. No significant interval change in 11 mm left subdural hematoma. Similar mass effect with 5 mm of left-to-right midline shift. No hydrocephalus or ventricular trapping. 4. No new  intracranial abnormality. Results were discussed with Dr. Jovita Gamma by telephone at 7:24 p.m. on September 26, 2017. Electronically Signed   By: Jeannine Boga M.D.   On: 09/26/2017 19:30   Ct Head Wo Contrast  Result Date: 09/26/2017 CLINICAL DATA:  Severe worst headache of life. Pain radiates into LEFT head, face and neck. History of hypertension. EXAM: CT HEAD WITHOUT CONTRAST CT CERVICAL SPINE WITHOUT CONTRAST TECHNIQUE: Multidetector CT imaging of the head and cervical spine was performed following the standard protocol without intravenous contrast. Multiplanar CT image reconstructions of the cervical spine were also generated. COMPARISON:  CT cervical spine December 19, 2009 FINDINGS: CT HEAD FINDINGS BRAIN: Dense LEFT holo hemispheric subdural hematoma measuring to 11 mm. 3 mm LEFT parafalcine subdural hematoma. 5 mm LEFT-to-RIGHT midline shift. No hydrocephalus or ventricular entrapment. No intraparenchymal hemorrhage. Punctate dural LEFT frontal calcification. No acute large vascular territory infarcts. Basal cisterns are patent. VASCULAR: Mild calcific atherosclerosis of the carotid siphons. SKULL: No skull fracture. Severe bilateral temporomandibular osteoarthrosis. No significant scalp soft tissue swelling. SINUSES/ORBITS: The mastoid air-cells and included paranasal sinuses are well-aerated.The included ocular globes and orbital contents are non-suspicious. OTHER: Cardiac pacemaker in the included chest. CT CERVICAL SPINE FINDINGS ALIGNMENT: Straightened lordosis. Minimal grade 1 C2-3 anterolisthesis. SKULL BASE AND VERTEBRAE: Cervical vertebral bodies and posterior elements are intact. Severe C5-6 disc height loss and endplate sclerosis compatible with degenerative disc, moderate C3-4. C1-2 articulation maintained with mild arthropathy. Heterogeneous bone mineral density with rounded density C3, unchanged from 2011. Multilevel severe upper RIGHT cervical facet arthropathy. SOFT TISSUES  AND SPINAL CANAL: Nonacute. Mild calcific atherosclerosis carotid bifurcations. Enlarged heterogeneous LEFT thyroid, status post RIGHT thyroidectomy. No dominant nodule. 6 mm LEFT thyroid nodule below size followup recommendation. DISC LEVELS: No significant osseous canal stenosis. Severe LEFT C5-6 neural foraminal narrowing. Moderate RIGHT C5-6 and LEFT C4-5 neural foraminal narrowing. UPPER CHEST: Lung apices are clear. OTHER: None. IMPRESSION: CT HEAD: 1. Acute 11 mm LEFT holo hemispheric subdural hematoma, 3 mm LEFT parafalcine component. 2. 5 mm LEFT-to-RIGHT midline shift.  No ventricular entrapment. CT CERVICAL SPINE: 1. No acute fracture. Minimal grade 1 C2-3 anterolisthesis on degenerative basis. 2. Severe LEFT C5-6 neural foraminal narrowing. Critical Value/emergent results were called by telephone at the time of interpretation on 09/26/2017 at 4:19 am to Dr. Pryor Curia , who verbally acknowledged these results. Electronically Signed   By: Elon Alas M.D.   On: 09/26/2017 04:23   Ct Cervical Spine Wo Contrast  Result Date: 09/26/2017 CLINICAL DATA:  Severe worst headache of life. Pain radiates into LEFT head, face and neck. History of hypertension. EXAM: CT HEAD WITHOUT CONTRAST CT CERVICAL SPINE WITHOUT CONTRAST TECHNIQUE: Multidetector CT imaging of the head and cervical spine was performed following the standard protocol without intravenous contrast. Multiplanar CT image reconstructions of the cervical spine were also generated. COMPARISON:  CT cervical spine December 19, 2009 FINDINGS: CT HEAD FINDINGS BRAIN: Dense LEFT holo hemispheric subdural hematoma measuring to 11 mm. 3 mm LEFT parafalcine subdural hematoma. 5 mm LEFT-to-RIGHT midline shift. No hydrocephalus or ventricular entrapment. No intraparenchymal hemorrhage. Punctate dural LEFT frontal calcification. No acute large vascular territory infarcts. Basal cisterns are patent. VASCULAR: Mild calcific atherosclerosis of the carotid  siphons. SKULL: No skull fracture. Severe bilateral temporomandibular osteoarthrosis. No significant scalp soft tissue swelling. SINUSES/ORBITS: The mastoid air-cells and included paranasal sinuses are well-aerated.The included ocular globes and orbital contents are non-suspicious. OTHER: Cardiac pacemaker in the included chest. CT CERVICAL SPINE FINDINGS ALIGNMENT: Straightened lordosis. Minimal grade 1 C2-3 anterolisthesis. SKULL BASE AND VERTEBRAE: Cervical vertebral bodies and posterior elements are  intact. Severe C5-6 disc height loss and endplate sclerosis compatible with degenerative disc, moderate C3-4. C1-2 articulation maintained with mild arthropathy. Heterogeneous bone mineral density with rounded density C3, unchanged from 2011. Multilevel severe upper RIGHT cervical facet arthropathy. SOFT TISSUES AND SPINAL CANAL: Nonacute. Mild calcific atherosclerosis carotid bifurcations. Enlarged heterogeneous LEFT thyroid, status post RIGHT thyroidectomy. No dominant nodule. 6 mm LEFT thyroid nodule below size followup recommendation. DISC LEVELS: No significant osseous canal stenosis. Severe LEFT C5-6 neural foraminal narrowing. Moderate RIGHT C5-6 and LEFT C4-5 neural foraminal narrowing. UPPER CHEST: Lung apices are clear. OTHER: None. IMPRESSION: CT HEAD: 1. Acute 11 mm LEFT holo hemispheric subdural hematoma, 3 mm LEFT parafalcine component. 2. 5 mm LEFT-to-RIGHT midline shift.  No ventricular entrapment. CT CERVICAL SPINE: 1. No acute fracture. Minimal grade 1 C2-3 anterolisthesis on degenerative basis. 2. Severe LEFT C5-6 neural foraminal narrowing. Critical Value/emergent results were called by telephone at the time of interpretation on 09/26/2017 at 4:19 am to Dr. Pryor Curia , who verbally acknowledged these results. Electronically Signed   By: Elon Alas M.D.   On: 09/26/2017 04:23    Assessment/Plan: Patient remains neurologically intact with only mild headache as described above. We'll  plan on advancing diet and activities. We will give patient's husband a prescription for tramadol, for her use at home as needed. I spoke with the patient and her husband last night and with the patient again today about signs and symptoms for contacting our office or for returning to the emergency room. I will want her to follow-up with me in about a week and a half or so with a repeat CT the brain without contrast.   Hosie Spangle, MD 09/27/2017, 7:49 AM

## 2017-09-28 NOTE — Discharge Summary (Signed)
Physician Discharge Summary  Patient ID: [name] MRN: [id number] DOB/AGE: 1946-11-26 71 y.o.  Admit date: 09/26/2017 Discharge date: 09/28/2017  Admission Diagnoses: Left subdural hematoma  Discharge Diagnoses: The same Active Problems:   Subdural hematoma [facility])   Discharged Condition: good  Hospital Course: The patient was admitted on 09/26/2017 with a left subdural hematoma. Follow-up head CT was stable. The patient clinically has done well.  On 09/28/2017 the patient requested discharge to home. The patient was given oral and written discharge instructions. All her questions were answered. She has previously been given a prescription for Ultram. She is to follow-up with Dr. Sherwood Gambler next week.  Consults: None Significant Diagnostic Studies: Head CT's Treatments: Observation Discharge Exam: Blood pressure (!) 140/95, pulse (!) 58, temperature 98.1 F (36.7 C), temperature source Oral, resp. rate 14, height 5\' 4"  (1.626 m), weight 56.7 kg (125 lb), SpO2 100 %. The patient is alert and oriented 3. Her strength and speech is normal. Pupils are equal.  Disposition: Home  Discharge Instructions    Call MD for:  difficulty breathing, headache or visual disturbances    Complete by:  As directed    Call MD for:  extreme fatigue    Complete by:  As directed    Call MD for:  hives    Complete by:  As directed    Call MD for:  persistant dizziness or light-headedness    Complete by:  As directed    Call MD for:  persistant nausea and vomiting    Complete by:  As directed    Call MD for:  redness, tenderness, or signs of infection (pain, swelling, redness, odor or green/yellow discharge around incision site)    Complete by:  As directed    Call MD for:  severe uncontrolled pain    Complete by:  As directed    Call MD for:  temperature >100.4    Complete by:  As directed    Diet - low sodium heart healthy    Complete by:  As directed    Discharge instructions    Complete  by:  As directed    Call [phone] for a followup appointment. Take a stool softener while you are using pain medications.   Driving Restrictions    Complete by:  As directed    Do not drive for 2 weeks.   Increase activity slowly    Complete by:  As directed    Lifting restrictions    Complete by:  As directed    Do not lift more than 5 pounds. No excessive bending or twisting.   May shower / Bathe    Complete by:  As directed    No wound care    Complete by:  As directed      Allergies as of 09/28/2017      Reactions   Hydrocodone    Increased HR   Antihistamines, Loratadine-type Other (See Comments)   Heart races/jittery   Bisoprolol-hydrochlorothiazide Other (See Comments)   Couldn't tolerate UNKNOWN REACTION   Bystolic [nebivolol Hcl] Other (See Comments)   Pt does not tolerate this med UNKNOWN REACTION   Cephalexin Nausea And Vomiting   Codeine Other (See Comments)   Increased heart rate, anxiety   Demerol [meperidine] Other (See Comments)   Increased heart rate, anxiety   Epinephrine Other (See Comments)   Increased heart rate   Erythromycin Nausea And Vomiting   Hyoscyamine Sulfate Other (See Comments)   Pt could not tolerate this  med UNKNOWN reaction   Inderal [propranolol] Other (See Comments)   Extreme tiredness   Lidocaine Other (See Comments)   Family hx of malignant hyperthermia   Lipitor [atorvastatin] Other (See Comments)   Muscle aches   Losartan Potassium Other (See Comments)   Pt could not tolerate this med UNKNOWN REACTION   Metoprolol Swelling   Morphine Other (See Comments)   Nightmares, increased BP   Nebivolol Other (See Comments)   angioedema   Oxycodone-acetaminophen    Other reaction(s): Tachycardia / Palpitations  (intolerance) Causes accelerated heart rate   Paxil [paroxetine Hcl] Other (See Comments)   "drove her up the wall"   Penicillins Nausea And Vomiting   Prednisone Other (See Comments)   Tolerates low doses if  absolutely necessary - makes her nervous, jittery   Propranolol Hcl Other (See Comments)   Pt could not tolerate this med UNKNOWN REACTION   Sertraline Hcl Other (See Comments)   Pt could not tolerate this med UNKNOWN REACTION   Tetracycline Nausea And Vomiting   Toprol Xl [metoprolol Succinate] Other (See Comments)   Extreme tiredness   Verapamil Other (See Comments)   Pt could not tolerate this med UNKNOWN REACTION   Hydralazine    headaches   Oxycodone-acetaminophen Palpitations   Causes accelerated heart rate      Medication List    TAKE these medications   acetaminophen 500 MG tablet Commonly known as:  TYLENOL Take 1,000 mg by mouth every 6 (six) hours as needed (pain).   diltiazem 120 MG 24 hr capsule Commonly known as:  CARDIZEM CD TAKE ONE CAPSULE BY MOUTH ONCE DAILY   GENTEAL 0.25-0.3 % Gel Generic drug:  Carboxymethylcell-Hypromellose Place 1 application into both eyes at bedtime.   ibuprofen 200 MG tablet Commonly known as:  ADVIL,MOTRIN Take 200 mg by mouth every 6 (six) hours as needed for moderate pain.   LORazepam 1 MG tablet Commonly known as:  ATIVAN Take 1 tablet (1 mg total) by mouth 4 (four) times daily. What changed:  when to take this  reasons to take this   nitroGLYCERIN 0.4 MG SL tablet Commonly known as:  NITROSTAT Place 1 tablet (0.4 mg total) under the tongue every 5 (five) minutes as needed. For up to 3 doses   nizatidine 150 MG capsule Commonly known as:  AXID TAKE (1) CAPSULE TWICE DAILY.   SYSTANE OP Place 1 drop into both eyes daily as needed (dry eyes).   traMADol 50 MG tablet Commonly known as:  ULTRAM Take 1 tablet (50 mg total) by mouth every 6 (six) hours as needed (pain).        SignedNewman Pies D 09/28/2017, 8:20 AM

## 2017-09-28 NOTE — Progress Notes (Signed)
RN reviewed discharge instructions with pt. VSS. PIV removed. Prescription for tramadol given. Pt with belongings (clothing, cell phone, glasses). Waiting for husband who will take her home.

## 2017-10-01 ENCOUNTER — Other Ambulatory Visit: Payer: Self-pay | Admitting: Neurosurgery

## 2017-10-01 DIAGNOSIS — S065X9A Traumatic subdural hemorrhage with loss of consciousness of unspecified duration, initial encounter: Secondary | ICD-10-CM

## 2017-10-01 DIAGNOSIS — S065XAA Traumatic subdural hemorrhage with loss of consciousness status unknown, initial encounter: Secondary | ICD-10-CM

## 2017-10-03 ENCOUNTER — Ambulatory Visit
Admission: RE | Admit: 2017-10-03 | Discharge: 2017-10-03 | Disposition: A | Discharge status: Home / Self Care | Payer: Medicare Other | Source: Ambulatory Visit | Attending: Neurosurgery | Admitting: Neurosurgery

## 2017-10-03 DIAGNOSIS — S065X9A Traumatic subdural hemorrhage with loss of consciousness of unspecified duration, initial encounter: Secondary | ICD-10-CM

## 2017-10-03 DIAGNOSIS — S065XAA Traumatic subdural hemorrhage with loss of consciousness status unknown, initial encounter: Secondary | ICD-10-CM

## 2017-10-07 ENCOUNTER — Other Ambulatory Visit: Payer: Medicare Other

## 2017-10-09 ENCOUNTER — Other Ambulatory Visit: Payer: Self-pay | Admitting: Neurosurgery

## 2017-10-09 DIAGNOSIS — S065X9A Traumatic subdural hemorrhage with loss of consciousness of unspecified duration, initial encounter: Secondary | ICD-10-CM

## 2017-10-09 DIAGNOSIS — S065XAA Traumatic subdural hemorrhage with loss of consciousness status unknown, initial encounter: Secondary | ICD-10-CM

## 2017-10-10 ENCOUNTER — Other Ambulatory Visit: Payer: Self-pay | Admitting: Cardiovascular Disease

## 2017-10-10 DIAGNOSIS — I119 Hypertensive heart disease without heart failure: Secondary | ICD-10-CM

## 2017-10-29 ENCOUNTER — Ambulatory Visit (INDEPENDENT_AMBULATORY_CARE_PROVIDER_SITE_OTHER): Payer: Medicare Other | Admitting: *Deleted

## 2017-10-29 DIAGNOSIS — I495 Sick sinus syndrome: Secondary | ICD-10-CM

## 2017-10-30 NOTE — Progress Notes (Signed)
Remote pacemaker transmission.   

## 2017-10-31 ENCOUNTER — Ambulatory Visit
Admission: RE | Admit: 2017-10-31 | Discharge: 2017-10-31 | Disposition: A | Discharge status: Home / Self Care | Payer: Medicare Other | Source: Ambulatory Visit | Attending: Neurosurgery | Admitting: Neurosurgery

## 2017-10-31 DIAGNOSIS — S065XAA Traumatic subdural hemorrhage with loss of consciousness status unknown, initial encounter: Secondary | ICD-10-CM

## 2017-10-31 DIAGNOSIS — S065X9A Traumatic subdural hemorrhage with loss of consciousness of unspecified duration, initial encounter: Secondary | ICD-10-CM

## 2017-10-31 LAB — CUP PACEART REMOTE DEVICE CHECK
Battery Voltage: 2.78 V
Brady Statistic AP VS Percent: 75 %
Brady Statistic AS VS Percent: 25 %
Implantable Lead Implant Date: 20060620
Implantable Lead Location: 753859
Implantable Lead Model: 5076
Lead Channel Impedance Value: 569 Ohm
Lead Channel Impedance Value: 594 Ohm
Lead Channel Pacing Threshold Amplitude: 0.75 V
Lead Channel Pacing Threshold Pulse Width: 0.4 ms
Lead Channel Sensing Intrinsic Amplitude: 16 mV
Lead Channel Setting Pacing Amplitude: 2 V
Lead Channel Setting Pacing Amplitude: 2.5 V
Lead Channel Setting Pacing Pulse Width: 0.4 ms
Lead Channel Setting Sensing Sensitivity: 5.6 mV
MDC IDC LEAD IMPLANT DT: 20060620
MDC IDC LEAD LOCATION: 753860
MDC IDC MSMT BATTERY IMPEDANCE: 156 Ohm
MDC IDC MSMT BATTERY REMAINING LONGEVITY: 135 mo
MDC IDC MSMT LEADCHNL RA PACING THRESHOLD AMPLITUDE: 0.75 V
MDC IDC MSMT LEADCHNL RA PACING THRESHOLD PULSEWIDTH: 0.4 ms
MDC IDC MSMT LEADCHNL RA SENSING INTR AMPL: 2.8 mV
MDC IDC PG IMPLANT DT: 20160219
MDC IDC SESS DTM: 20181127162515
MDC IDC STAT BRADY AP VP PERCENT: 0 %
MDC IDC STAT BRADY AS VP PERCENT: 0 %

## 2017-11-01 ENCOUNTER — Encounter: Payer: Self-pay | Admitting: Cardiology

## 2017-11-05 ENCOUNTER — Other Ambulatory Visit: Payer: Self-pay | Admitting: Neurosurgery

## 2017-11-05 DIAGNOSIS — S065XAA Traumatic subdural hemorrhage with loss of consciousness status unknown, initial encounter: Secondary | ICD-10-CM

## 2017-11-05 DIAGNOSIS — S065X9A Traumatic subdural hemorrhage with loss of consciousness of unspecified duration, initial encounter: Secondary | ICD-10-CM

## 2017-11-27 ENCOUNTER — Other Ambulatory Visit: Payer: Self-pay | Admitting: Family Medicine

## 2017-11-27 NOTE — Telephone Encounter (Signed)
Last Rx 07/2017 #120 with 2R. Last OV 05/2017

## 2017-11-27 NOTE — Telephone Encounter (Signed)
Rx called in to requested pharmacy 

## 2017-11-27 NOTE — Telephone Encounter (Signed)
Please call in.  Thanks.   

## 2017-12-13 ENCOUNTER — Ambulatory Visit
Admission: RE | Admit: 2017-12-13 | Discharge: 2017-12-13 | Disposition: A | Discharge status: Home / Self Care | Payer: Medicare Other | Source: Ambulatory Visit | Attending: Neurosurgery | Admitting: Neurosurgery

## 2017-12-13 DIAGNOSIS — S065X9A Traumatic subdural hemorrhage with loss of consciousness of unspecified duration, initial encounter: Secondary | ICD-10-CM

## 2017-12-13 DIAGNOSIS — S065XAA Traumatic subdural hemorrhage with loss of consciousness status unknown, initial encounter: Secondary | ICD-10-CM

## 2018-01-23 ENCOUNTER — Encounter: Payer: Self-pay | Admitting: Family Medicine

## 2018-01-23 ENCOUNTER — Telehealth: Payer: Self-pay | Admitting: *Deleted

## 2018-01-23 NOTE — Telephone Encounter (Signed)
Letter has been received from patient which [employer] sent indicating a quantity limit on Lorazepam.  Form placed in your In Box.  Patient's husband sent numerous MyChart messages concerning this.

## 2018-01-24 NOTE — Telephone Encounter (Signed)
I'll work on it when possible. Thanks.

## 2018-01-27 ENCOUNTER — Other Ambulatory Visit: Payer: Self-pay | Admitting: Neurosurgery

## 2018-01-27 DIAGNOSIS — S065XAA Traumatic subdural hemorrhage with loss of consciousness status unknown, initial encounter: Secondary | ICD-10-CM

## 2018-01-27 DIAGNOSIS — S065X9A Traumatic subdural hemorrhage with loss of consciousness of unspecified duration, initial encounter: Secondary | ICD-10-CM

## 2018-01-28 ENCOUNTER — Ambulatory Visit (INDEPENDENT_AMBULATORY_CARE_PROVIDER_SITE_OTHER): Payer: Medicare Other | Admitting: *Deleted

## 2018-01-28 DIAGNOSIS — I495 Sick sinus syndrome: Secondary | ICD-10-CM

## 2018-01-28 NOTE — Progress Notes (Signed)
Remote pacemaker transmission.   

## 2018-01-29 ENCOUNTER — Other Ambulatory Visit: Payer: Self-pay | Admitting: Family Medicine

## 2018-01-29 DIAGNOSIS — Z1231 Encounter for screening mammogram for malignant neoplasm of breast: Secondary | ICD-10-CM

## 2018-01-30 ENCOUNTER — Encounter: Payer: Self-pay | Admitting: Cardiology

## 2018-01-30 NOTE — Telephone Encounter (Signed)
Spoke to pts husband, Harrie Jeans and advised fax received indicating additional lorazepam tabs have been denied. Advised him,per Dr Damita Dunnings he may have to pay out of pocket. Husband states he is on the other line and may have to call back to discuss further or may send a message to Dr Damita Dunnings via [facility] Ping

## 2018-02-03 ENCOUNTER — Encounter: Payer: Self-pay | Admitting: Family Medicine

## 2018-02-03 ENCOUNTER — Telehealth: Payer: Self-pay | Admitting: Family Medicine

## 2018-02-03 NOTE — Telephone Encounter (Signed)
See below. Please see if this was processed appropriately.  I don't recall a situation like this prev.   Thanks.  [facility]    Dr. Damita Dunnings,    On February 01, 2018, [name] received the denial letter from [facility] regarding her Lorazepam 1 mg prescription. However, under our insurance coverage, [employer] [facility] is only responsible for medicines that are administered while we are hospitalized (Medicare Part B). [name]'s Lorazepam is administered by our prescription drug plan (Medicare Part D), which is [employer] ([place]). We thank you for requesting the exception to the new limits on [name]'s Lorazepam prescription, but apparently your request was placed with the wrong company. It should be processed with [employer] and not our medical/hospitalization coverage. [name] is requesting you to process the Prior Authorization exception request with [employer] on their web page at: [email or web address]. [name]'s ID number for [employer] is: [id number].    Once again we thank you for your efforts and consideration in this matter. If you or your staff need to talk with [place], please call on [phone].

## 2018-02-04 ENCOUNTER — Encounter: Payer: Self-pay | Admitting: *Deleted

## 2018-02-04 ENCOUNTER — Telehealth: Payer: Self-pay | Admitting: Nurse Practitioner

## 2018-02-04 NOTE — Telephone Encounter (Signed)
Mr. Kretzschmar calling because the letter his wife received (sent 01/30/18) says that the statement is incorrect about insurance coverage.  We have removed this statement from the form letter.  He also is upset that they have not seen results in MyChart (I am unsure why, all results are released to MyChart once the physician has signed) and that they have to trust that her pacemaker is ok. I let them no that no episodes have been recorded and that her PVC count is 722. No recommendations made by Dr. Rayann Heman for changes/adjustments. He verbalizes understanding.

## 2018-02-04 NOTE — Telephone Encounter (Signed)
New Message   1. Has your device fired? no  2. Is you device beeping? no  3. Are you experiencing draining or swelling at device site? no  4. Are you calling to see if we received your device transmission? no  5. Have you passed out? no  Pt husband says that he is calling about a letter he rcvd and wants to discuss. Please call  Please route to [place]

## 2018-02-04 NOTE — Telephone Encounter (Signed)
I (and others here) do PA's according to the information on the patient's insurance card on file.  That states [facility].  I have no way of knowing that the PA needs to go anywhere other than that.  In addition, I will need the info on the [employer] card to start all over again.  Note to patient was sent through [place].

## 2018-02-05 NOTE — Telephone Encounter (Signed)
Thanks for taking care of this.

## 2018-02-12 LAB — CUP PACEART REMOTE DEVICE CHECK
Brady Statistic AP VS Percent: 76 %
Brady Statistic AS VP Percent: 0 %
Brady Statistic AS VS Percent: 24 %
Date Time Interrogation Session: 20190226143838
Implantable Lead Implant Date: 20060620
Implantable Lead Location: 753859
Implantable Lead Location: 753860
Lead Channel Impedance Value: 635 Ohm
Lead Channel Pacing Threshold Pulse Width: 0.4 ms
Lead Channel Pacing Threshold Pulse Width: 0.4 ms
Lead Channel Setting Pacing Amplitude: 2.5 V
Lead Channel Setting Sensing Sensitivity: 5.6 mV
MDC IDC LEAD IMPLANT DT: 20060620
MDC IDC MSMT BATTERY IMPEDANCE: 156 Ohm
MDC IDC MSMT BATTERY REMAINING LONGEVITY: 137 mo
MDC IDC MSMT BATTERY VOLTAGE: 2.78 V
MDC IDC MSMT LEADCHNL RA IMPEDANCE VALUE: 645 Ohm
MDC IDC MSMT LEADCHNL RA PACING THRESHOLD AMPLITUDE: 0.75 V
MDC IDC MSMT LEADCHNL RV PACING THRESHOLD AMPLITUDE: 0.75 V
MDC IDC PG IMPLANT DT: 20160219
MDC IDC SET LEADCHNL RA PACING AMPLITUDE: 2 V
MDC IDC SET LEADCHNL RV PACING PULSEWIDTH: 0.4 ms
MDC IDC STAT BRADY AP VP PERCENT: 0 %

## 2018-02-20 ENCOUNTER — Ambulatory Visit
Admission: RE | Admit: 2018-02-20 | Discharge: 2018-02-20 | Disposition: A | Discharge status: Home / Self Care | Payer: Medicare Other | Source: Ambulatory Visit | Attending: Neurosurgery | Admitting: Neurosurgery

## 2018-02-20 ENCOUNTER — Other Ambulatory Visit: Payer: Medicare Other

## 2018-02-20 DIAGNOSIS — S065X9A Traumatic subdural hemorrhage with loss of consciousness of unspecified duration, initial encounter: Secondary | ICD-10-CM

## 2018-02-20 DIAGNOSIS — S065XAA Traumatic subdural hemorrhage with loss of consciousness status unknown, initial encounter: Secondary | ICD-10-CM

## 2018-03-18 ENCOUNTER — Ambulatory Visit
Admission: RE | Admit: 2018-03-18 | Discharge: 2018-03-18 | Disposition: A | Discharge status: Home / Self Care | Payer: Medicare Other | Source: Ambulatory Visit | Attending: Family Medicine | Admitting: Family Medicine

## 2018-03-18 DIAGNOSIS — Z1231 Encounter for screening mammogram for malignant neoplasm of breast: Secondary | ICD-10-CM

## 2018-03-19 ENCOUNTER — Encounter: Payer: Self-pay | Admitting: *Deleted

## 2018-03-20 ENCOUNTER — Other Ambulatory Visit: Payer: Self-pay | Admitting: Family Medicine

## 2018-03-20 NOTE — Telephone Encounter (Signed)
Electronic refill request. Lorazepam Last office visit:   05/20/17 Last Filled:    120 tablet 2 11/27/2017  Please advise.

## 2018-03-23 NOTE — Telephone Encounter (Signed)
Has f/u pending. Sent. Thanks.   

## 2018-03-24 ENCOUNTER — Encounter: Payer: Self-pay | Admitting: Family Medicine

## 2018-03-31 ENCOUNTER — Encounter: Payer: Self-pay | Admitting: Family Medicine

## 2018-03-31 ENCOUNTER — Ambulatory Visit: Payer: Medicare Other | Admitting: Family Medicine

## 2018-03-31 VITALS — BP 156/92 | HR 80 | Temp 98.3°F | Ht 64.0 in | Wt 127.8 lb

## 2018-03-31 DIAGNOSIS — R5383 Other fatigue: Secondary | ICD-10-CM

## 2018-03-31 LAB — COMPREHENSIVE METABOLIC PANEL
ALBUMIN: 4.2 g/dL (ref 3.5–5.2)
ALT: 9 U/L (ref 0–35)
AST: 13 U/L (ref 0–37)
Alkaline Phosphatase: 50 U/L (ref 39–117)
BILIRUBIN TOTAL: 0.6 mg/dL (ref 0.2–1.2)
BUN: 15 mg/dL (ref 6–23)
CALCIUM: 9.3 mg/dL (ref 8.4–10.5)
CO2: 32 meq/L (ref 19–32)
Chloride: 99 mEq/L (ref 96–112)
Creatinine, Ser: 0.83 mg/dL (ref 0.40–1.20)
GFR: 72.16 mL/min (ref >60.00)
Glucose, Bld: 86 mg/dL (ref 70–99)
Potassium: 4.5 mEq/L (ref 3.5–5.1)
SODIUM: 136 meq/L (ref 135–145)
Total Protein: 7 g/dL (ref 6.0–8.3)

## 2018-03-31 LAB — CBC WITH DIFFERENTIAL/PLATELET
BASOS ABS: 0 10*3/uL (ref 0.0–0.1)
Basophils Relative: 0.7 % (ref 0.0–3.0)
EOS ABS: 0 10*3/uL (ref 0.0–0.7)
Eosinophils Relative: 1 % (ref 0.0–5.0)
HEMATOCRIT: 40.7 % (ref 36.0–46.0)
HEMOGLOBIN: 13.9 g/dL (ref 12.0–15.0)
LYMPHS PCT: 27.6 % (ref 12.0–46.0)
Lymphs Abs: 1 10*3/uL (ref 0.7–4.0)
MCHC: 34.2 g/dL (ref 30.0–36.0)
MCV: 95.8 fl (ref 78.0–100.0)
Monocytes Absolute: 0.3 10*3/uL (ref 0.1–1.0)
Monocytes Relative: 9.2 % (ref 3.0–12.0)
Neutro Abs: 2.3 10*3/uL (ref 1.4–7.7)
Neutrophils Relative %: 61.5 % (ref 43.0–77.0)
PLATELETS: 200 10*3/uL (ref 150.0–400.0)
RBC: 4.25 Mil/uL (ref 3.87–5.11)
RDW: 12.7 % (ref 11.5–15.5)
WBC: 3.7 10*3/uL — AB (ref 4.0–10.5)

## 2018-03-31 LAB — TSH: TSH: 2.25 u[IU]/mL (ref 0.35–4.50)

## 2018-03-31 NOTE — Patient Instructions (Addendum)
Go to the lab on the way out.  We'll contact you with your lab report. I would presume that you likely have some post-concussion symptoms.  Update me as needed. Take care.  Glad to see you.

## 2018-03-31 NOTE — Progress Notes (Signed)
She had subdural in 09/2017 and was on keppra.  She was drowsy and had nightmares on the medicine.  She was on it for preventive treatment for SZ.  She had serial CTs and was able to d/c the medicine.    Most recent CT was 02/20/18 with complete resolution of LEFT subdural. No residual or concerning features.  She still doesn't feel well at baseline.  Some days w/o much energy "where I don't feel like myself."  No SZ.  No new neurologic sx.  She'll occ feel herself tensing up, that she can alleviate with relaxation techniques. Memory is "okay" per patient, but "not as good as it used to be" per her husband- the doesn't appear to be an acute change.   Some concentration changes.    She clearly dreams more than prev.  She doesn't have exertional chest pain.  She is less interested in going out and being social.  She is relatively deconditioned.    She has been off keppra for about 5-6 weeks.  She had more trouble getting off the medicine than she did taking the medicine.  She has more mood swings w/o clear triggers, more tearful and more irritable- episodically.   No trauma known to cause the initial subdural.  She isn't having severe headaches like she did with prev event.  She has some HA related to her neck with sleeping incorrectly.    She is worried about another event, d/w pt.    PMH and SH reviewed  ROS: Per HPI unless specifically indicated in ROS section   Meds, vitals, and allergies reviewed.   GEN: nad, alert and oriented HEENT: mucous membranes moist NECK: supple w/o LA CV: rrr PULM: ctab, no inc wob ABD: soft, +bs EXT: no edema SKIN: no acute rash Normal memory testing today, 3/3 recall, normal orientation.

## 2018-04-01 NOTE — Assessment & Plan Note (Signed)
She clearly had some changes from the time of the event onward.  Unclear how much of this was previously due to the subdural itself, medication use with Keppra, cessation of Keppra.  She still has some changes at this point.  I do not think her current symptoms could be attributed to Keppra use or cessation.  Discussed. I would presume that she likely have some post-concussion symptoms, given the previous event.  She does have some fatigue in addition to the changes with dreams and concentration.  She does have some mood changes.  At this point she still okay for outpatient follow-up.  I would expect some of her symptoms to gradually improve.  We did not change in medications at this point.  I will check baseline labs given her fatigue.  Rationale discussed with patient.  She understood and agreed. >25 minutes spent in face to face time with patient, >50% spent in counselling or coordination of care.

## 2018-04-07 ENCOUNTER — Other Ambulatory Visit: Payer: Self-pay | Admitting: Cardiovascular Disease

## 2018-04-07 DIAGNOSIS — I119 Hypertensive heart disease without heart failure: Secondary | ICD-10-CM

## 2018-04-24 ENCOUNTER — Encounter: Payer: Self-pay | Admitting: Nurse Practitioner

## 2018-04-30 ENCOUNTER — Ambulatory Visit (INDEPENDENT_AMBULATORY_CARE_PROVIDER_SITE_OTHER): Payer: Medicare Other | Admitting: *Deleted

## 2018-04-30 DIAGNOSIS — I495 Sick sinus syndrome: Secondary | ICD-10-CM | POA: Diagnosis not present

## 2018-05-01 ENCOUNTER — Encounter: Payer: Medicare Other | Admitting: Nurse Practitioner

## 2018-05-01 LAB — CUP PACEART REMOTE DEVICE CHECK
Battery Impedance: 156 Ohm
Battery Remaining Longevity: 136 mo
Brady Statistic AP VP Percent: 0 %
Brady Statistic AP VS Percent: 76 %
Brady Statistic AS VP Percent: 0 %
Brady Statistic AS VS Percent: 24 %
Implantable Lead Implant Date: 20060620
Implantable Lead Implant Date: 20060620
Implantable Lead Location: 753860
Implantable Lead Model: 5076
Implantable Pulse Generator Implant Date: 20160219
Lead Channel Impedance Value: 604 Ohm
Lead Channel Impedance Value: 637 Ohm
Lead Channel Pacing Threshold Amplitude: 0.75 V
Lead Channel Pacing Threshold Amplitude: 0.75 V
Lead Channel Pacing Threshold Pulse Width: 0.4 ms
Lead Channel Setting Pacing Amplitude: 2 V
Lead Channel Setting Pacing Amplitude: 2.5 V
MDC IDC LEAD LOCATION: 753859
MDC IDC MSMT BATTERY VOLTAGE: 2.78 V
MDC IDC MSMT LEADCHNL RA PACING THRESHOLD PULSEWIDTH: 0.4 ms
MDC IDC SESS DTM: 20190529141031
MDC IDC SET LEADCHNL RV PACING PULSEWIDTH: 0.4 ms
MDC IDC SET LEADCHNL RV SENSING SENSITIVITY: 5.6 mV

## 2018-05-01 NOTE — Progress Notes (Signed)
Remote pacemaker transmission.   

## 2018-05-18 NOTE — Progress Notes (Signed)
Electrophysiology Office Note Date: 05/19/2018  ID:  [name], DOB 1946/02/28, MRN [id number]  PCP: Tonia Ghent, MD Primary Cardiologist: Angelena Form Electrophysiologist: Allred  CC: Pacemaker follow-up  [name] is a 72 y.o. female seen today for Dr Rayann Heman.  She presents today for routine electrophysiology followup.  Since last being seen in our clinic, the patient reports doing reasonably well.  Since last being seen in clinic, she had a spontaneous SDH that resolved on it's own.  It was discovered with severe headache. She has been followed by Dr Sherwood Gambler. She has persistent intermittent palpitations.  She denies chest pain, dyspnea, PND, orthopnea, nausea, vomiting, dizziness, syncope, edema, weight gain, or early satiety.  Device History: MDT dual chamber PPM implanted 2006 for sick sinus syndrome; gen change 2016  Past Medical History:  Diagnosis Date  . Adenomatous colon polyp 12/2006  . Allergic rhinitis   . Anemia   . Arthritis   . Bradycardia    s/p MDT PPM  . Depression   . Esophageal stricture   . GERD (gastroesophageal reflux disease)   . Hemorrhoids   . Hyperlipidemia   . Hypertension   . Inguinal hernia   . Osteopenia 12/2012   T score -1.7 FRAX 8.5%/1.1%  . PVC (premature ventricular contraction)   . Scleritis of right eye   . Skin cancer    Basal and squamous cell  . Sleep apnea    a. intolerant of CPAP  . Thyroid disease   . Vitreous detachment of both eyes    Past Surgical History:  Procedure Laterality Date  . ABDOMINAL HYSTERECTOMY    . BREAST SURGERY     Biopsy-benign  . CESAREAN SECTION    . DILATION AND CURETTAGE OF UTERUS    . ESOPHAGOGASTRODUODENOSCOPY     esophageal stretching  . HAND SURGERY  1992  . HERNIA REPAIR    . INGUINAL HERNIA REPAIR  02/12/1994   left inguinal hernia repair  . KNEE ARTHROSCOPY  2011  . OOPHORECTOMY  1995   LSO and RSO  . PACEMAKER GENERATOR CHANGE N/A 01/21/2015   Procedure: PACEMAKER  GENERATOR CHANGE;  Surgeon: [name], MD;  [facility] C[facility] LAB;  Service: Cardiovascular;  Laterality: N/A;  . PACEMAKER INSERTION  2006   Dr. Verlon Setting  . POLYPECTOMY    . Tear duct surgery  2010  . THUMB SURGERY    . THYROIDECTOMY  1979   RIGHT SIDE  . TOTAL KNEE ARTHROPLASTY Right 12/20/2014   Procedure: RIGHT TOTAL KNEE ARTHROPLASTY;  Surgeon: Vickey Huger, MD;  Location: [facility];  Service: Orthopedics;  Laterality: Right;    Current Outpatient Medications  Medication Sig Dispense Refill  . acetaminophen (TYLENOL) 500 MG tablet Take 1,000 mg by mouth every 6 (six) hours as needed (pain).    . Carboxymethylcell-Hypromellose (GENTEAL) 0.25-0.3 % GEL Place 1 application into both eyes at bedtime.    Marland Kitchen diltiazem (CARDIZEM CD) 120 MG 24 hr capsule TAKE 1 CAPSULE BY MOUTH ONCE DAILY 30 capsule 1  . LORazepam (ATIVAN) 1 MG tablet TAKE 1 TABLET BY MOUTH 4 TIMES DAILY 120 tablet 2  . nitroGLYCERIN (NITROSTAT) 0.4 MG SL tablet Place 1 tablet (0.4 mg total) under the tongue every 5 (five) minutes as needed. For up to 3 doses 25 tablet 6  . nizatidine (AXID) 150 MG capsule TAKE (1) CAPSULE TWICE DAILY. 60 capsule 12  . Polyethyl Glycol-Propyl Glycol (SYSTANE OP) Place 1 drop into both eyes daily as needed (  dry eyes).    . traMADol (ULTRAM) 50 MG tablet Take 1 tablet (50 mg total) by mouth every 6 (six) hours as needed (pain). 60 tablet 0   No current facility-administered medications for this visit.     Allergies:   Hydrocodone; Antihistamines, loratadine-type; Bisoprolol-hydrochlorothiazide; Bystolic [nebivolol hcl]; Cephalexin; Codeine; Demerol [meperidine]; Epinephrine; Erythromycin; Hyoscyamine sulfate; Inderal [propranolol]; Lidocaine; Lipitor [atorvastatin]; Losartan potassium; Metoprolol; Morphine; Nebivolol; Oxycodone-acetaminophen; Paxil [paroxetine hcl]; Penicillins; Prednisone; Propranolol hcl; Sertraline hcl; Tetracycline; Toprol xl [metoprolol succinate]; Verapamil; Hydralazine; and  Oxycodone-acetaminophen   Social History: Social History   Socioeconomic History  . Marital status: Married    Spouse name: Harrie Jeans  . Number of children: 1  . Years of education: Not on file  . Highest education level: Not on file  Occupational History  . Occupation: Retired    Fish farm manager: UNEMPLOYED  Social Needs  . Financial resource strain: Not on file  . Food insecurity:    Worry: Not on file    Inability: Not on file  . Transportation needs:    Medical: Not on file    Non-medical: Not on file  Tobacco Use  . Smoking status: Never Smoker  . Smokeless tobacco: Never Used  Substance and Sexual Activity  . Alcohol use: No    Alcohol/week: 0.0 oz  . Drug use: No  . Sexual activity: Not Currently    Birth control/protection: Surgical, Post-menopausal    Comment: HYST-1st intercourse 72 yo-fewer than 5 partners  Lifestyle  . Physical activity:    Days per week: Not on file    Minutes per session: Not on file  . Stress: Not on file  Relationships  . Social connections:    Talks on phone: Not on file    Gets together: Not on file    Attends religious service: Not on file    Active member of club or organization: Not on file    Attends meetings of clubs or organizations: Not on file    Relationship status: Not on file  . Intimate partner violence:    Fear of current or ex partner: Not on file    Emotionally abused: Not on file    Physically abused: Not on file    Forced sexual activity: Not on file  Other Topics Concern  . 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 Heart failure Sister   . Heart disease Sister   . Hypertension Sister   . Dementia Mother   . Hypertension Mother   . Hypertension Father   . Arthritis Father   . Breast cancer Maternal Aunt        Age 5's  . Diabetes  Maternal Aunt   . Diabetes Maternal Uncle   . Ovarian cancer Maternal Aunt   . Colon cancer Neg Hx      Review of Systems: All other systems reviewed and are otherwise negative except as noted above.   Physical Exam: VS:  BP 132/86   Pulse 71   Ht 5\' 4"  (1.626 m)   Wt 127 lb (57.6 kg)   SpO2 99%   BMI 21.80 kg/m  , BMI Body mass index is 21.8 kg/m.  GEN- The patient is well appearing, alert and oriented x 3 today.   HEENT: normocephalic, atraumatic; sclera  clear, conjunctiva pink; hearing intact; oropharynx clear; neck supple  Lungs- Clear to ausculation bilaterally, normal work of breathing.  No wheezes, rales, rhonchi Heart- Regular rate and rhythm  GI- soft, non-tender, non-distended, bowel sounds present  Extremities- no clubbing, cyanosis, or edema  MS- no significant deformity or atrophy Skin- warm and dry, no rash or lesion; PPM pocket well healed Psych- euthymic mood, full affect Neuro- strength and sensation are intact  PPM Interrogation- reviewed in detail today,  See PACEART report  EKG:  EKG is not ordered today.  Recent Labs: 03/31/2018: ALT 9; BUN 15; Creatinine, Ser 0.83; Hemoglobin 13.9; Platelets 200.0; Potassium 4.5; Sodium 136; TSH 2.25   Wt Readings from Last 3 Encounters:  05/19/18 127 lb (57.6 kg)  03/31/18 127 lb 12 oz (57.9 kg)  09/26/17 125 lb (56.7 kg)     Assessment and Plan:  1.  Sick sinus syndrome Normal PPM function See Pace Art report No changes today  2.  HTN Stable No change required today  3.  Palpitations Stable No change required today  4.  Subdural hematoma Occurred spontaneously Resolved without intervention Followed by Dr Sherwood Gambler  Current medicines are reviewed at length with the patient today.   The patient does not have concerns regarding her medicines.  The following changes were made today:  none  Labs/ tests ordered today include: none Orders Placed This Encounter  Procedures  . CUP PACEART INCLINIC  DEVICE CHECK     Disposition:   Follow up with [facility], Dr Rayann Heman 1 year      Signed, Chanetta Marshall, NP 05/19/2018 2:32 PM  [facility] [address] [place] [address] [phone] (office) [phone] (fax)

## 2018-05-19 ENCOUNTER — Ambulatory Visit (INDEPENDENT_AMBULATORY_CARE_PROVIDER_SITE_OTHER): Payer: Medicare Other | Admitting: Nurse Practitioner

## 2018-05-19 ENCOUNTER — Encounter: Payer: Self-pay | Admitting: Nurse Practitioner

## 2018-05-19 VITALS — BP 132/86 | HR 71 | Ht 64.0 in | Wt 127.0 lb

## 2018-05-19 DIAGNOSIS — I10 Essential (primary) hypertension: Secondary | ICD-10-CM | POA: Diagnosis not present

## 2018-05-19 DIAGNOSIS — S065X9A Traumatic subdural hemorrhage with loss of consciousness of unspecified duration, initial encounter: Secondary | ICD-10-CM

## 2018-05-19 DIAGNOSIS — I495 Sick sinus syndrome: Secondary | ICD-10-CM

## 2018-05-19 DIAGNOSIS — R002 Palpitations: Secondary | ICD-10-CM | POA: Diagnosis not present

## 2018-05-19 DIAGNOSIS — S065XAA Traumatic subdural hemorrhage with loss of consciousness status unknown, initial encounter: Secondary | ICD-10-CM

## 2018-05-19 LAB — CUP PACEART INCLINIC DEVICE CHECK
Date Time Interrogation Session: 20190617122435
Implantable Lead Implant Date: 20060620
Implantable Lead Model: 5076
Implantable Lead Model: 5076
Implantable Pulse Generator Implant Date: 20160219
MDC IDC LEAD IMPLANT DT: 20060620
MDC IDC LEAD LOCATION: 753859
MDC IDC LEAD LOCATION: 753860

## 2018-05-19 NOTE — Patient Instructions (Addendum)
Medication Instructions:  Your physician recommends that you continue on your current medications as directed. Please refer to the Current Medication list given to you today.   If you need a refill on your cardiac medications before your next appointment, please call your pharmacy.  Labwork: NONE ORDERED  TODAY    Testing/Procedures: NONE ORDERED  TODAY    Follow-Up:  Your physician wants you to follow-up in: [place] will receive a reminder letter in the mail two months in advance. If you don't receive a letter, please call our office to schedule the follow-up appointment.    Remote monitoring is used to monitor your Pacemaker of ICD from home. This monitoring reduces the number of office visits required to check your device to one time per year. It allows [place] to keep an eye on the functioning of your device to ensure it is working properly. You are scheduled for a device check from home on . 07-31-18 You may send your transmission at any time that day. If you have a wireless device, the transmission will be sent automatically. After your physician reviews your transmission, you will receive a postcard with your next transmission date.     Any Other Special Instructions Will Be Listed Below (If Applicable).

## 2018-05-26 ENCOUNTER — Encounter: Payer: Self-pay | Admitting: Cardiovascular Disease

## 2018-05-26 ENCOUNTER — Ambulatory Visit: Payer: Medicare Other | Admitting: Cardiovascular Disease

## 2018-05-26 ENCOUNTER — Ambulatory Visit (INDEPENDENT_AMBULATORY_CARE_PROVIDER_SITE_OTHER): Payer: Medicare Other | Admitting: Cardiovascular Disease

## 2018-05-26 VITALS — BP 142/86 | HR 61 | Ht 64.0 in | Wt 128.4 lb

## 2018-05-26 DIAGNOSIS — E7800 Pure hypercholesterolemia, unspecified: Secondary | ICD-10-CM | POA: Diagnosis not present

## 2018-05-26 DIAGNOSIS — I495 Sick sinus syndrome: Secondary | ICD-10-CM

## 2018-05-26 DIAGNOSIS — I491 Atrial premature depolarization: Secondary | ICD-10-CM | POA: Diagnosis not present

## 2018-05-26 DIAGNOSIS — I10 Essential (primary) hypertension: Secondary | ICD-10-CM | POA: Diagnosis not present

## 2018-05-26 NOTE — Progress Notes (Signed)
Chief Complaint  Patient presents with  . Follow-up    PACs   History of Present Illness: 72 yo female with history of sleep apnea, HLD, HTN, GERD/hiatal hernia/esopagheal stricture, symptomatic bradycardia s/p pacemaker who is here today for cardiac follow up. She had been followed by Dr. Mare Ferrari for general cardiology issues. Her pacemaker is followed by Dr. Rayann Heman. The pacemaker was changed in 2016 due to end of battery life. Echo June 2017 with normal LV function and small pericardial effusion with no tamponade. She is known to have PACs and PVCs.  She had a spontaneous subdural hematoma in October 2018 that did not require surgical intervention.   She is here today for follow up. The patient denies any chest pain, dyspnea, palpitations, lower extremity edema, orthopnea, PND, dizziness, near syncope or syncope. She still feels a little "off" at times following her SDH. Overall doing much better.   Primary Care Physician: Tonia Ghent, MD  Past Medical History:  Diagnosis Date  . Adenomatous colon polyp 12/2006  . Allergic rhinitis   . Anemia   . Arthritis   . Bradycardia    s/p MDT PPM  . Depression   . Esophageal stricture   . GERD (gastroesophageal reflux disease)   . Hemorrhoids   . Hyperlipidemia   . Hypertension   . Inguinal hernia   . Osteopenia 12/2012   T score -1.7 FRAX 8.5%/1.1%  . PVC (premature ventricular contraction)   . Scleritis of right eye   . Skin cancer    Basal and squamous cell  . Sleep apnea    a. intolerant of CPAP  . Subdural hematoma ([place])    a. spontanous 09/2017 b. resolved without intervention  . Thyroid disease   . Vitreous detachment of both eyes     Past Surgical History:  Procedure Laterality Date  . ABDOMINAL HYSTERECTOMY    . BREAST SURGERY     Biopsy-benign  . CESAREAN SECTION    . DILATION AND CURETTAGE OF UTERUS    . ESOPHAGOGASTRODUODENOSCOPY     esophageal stretching  . HAND SURGERY  1992  . HERNIA REPAIR    .  INGUINAL HERNIA REPAIR  02/12/1994   left inguinal hernia repair  . KNEE ARTHROSCOPY  2011  . OOPHORECTOMY  1995   LSO and RSO  . PACEMAKER GENERATOR CHANGE N/A 01/21/2015   Procedure: PACEMAKER GENERATOR CHANGE;  Surgeon: [name], MD;  Location: [facility] CATH LAB;  Service: Cardiovascular;  Laterality: N/A;  . PACEMAKER INSERTION  2006   Dr. Verlon Setting  . POLYPECTOMY    . Tear duct surgery  2010  . THUMB SURGERY    . THYROIDECTOMY  1979   RIGHT SIDE  . TOTAL KNEE ARTHROPLASTY Right 12/20/2014   Procedure: RIGHT TOTAL KNEE ARTHROPLASTY;  Surgeon: Vickey Huger, MD;  Location: [facility];  Service: Orthopedics;  Laterality: Right;    Current Outpatient Medications  Medication Sig Dispense Refill  . acetaminophen (TYLENOL) 500 MG tablet Take 1,000 mg by mouth every 6 (six) hours as needed (pain).    . Carboxymethylcell-Hypromellose (GENTEAL) 0.25-0.3 % GEL Place 1 application into both eyes at bedtime.    Marland Kitchen diltiazem (CARDIZEM CD) 120 MG 24 hr capsule TAKE 1 CAPSULE BY MOUTH ONCE DAILY 30 capsule 1  . LORazepam (ATIVAN) 1 MG tablet TAKE 1 TABLET BY MOUTH 4 TIMES DAILY 120 tablet 2  . nitroGLYCERIN (NITROSTAT) 0.4 MG SL tablet Place 1 tablet (0.4 mg total) under the tongue every 5 (five)  minutes as needed. For up to 3 doses 25 tablet 6  . nizatidine (AXID) 150 MG capsule TAKE (1) CAPSULE TWICE DAILY. 60 capsule 12  . Polyethyl Glycol-Propyl Glycol (SYSTANE OP) Place 1 drop into both eyes daily as needed (dry eyes).    . traMADol (ULTRAM) 50 MG tablet Take 1 tablet (50 mg total) by mouth every 6 (six) hours as needed (pain). 60 tablet 0   No current facility-administered medications for this visit.     Allergies  Allergen Reactions  . Hydrocodone     Increased HR  . Antihistamines, Loratadine-Type Other (See Comments)    Heart races/jittery  . Bisoprolol-Hydrochlorothiazide Other (See Comments)    Couldn't tolerate UNKNOWN REACTION  . Bystolic [Nebivolol Hcl] Other (See Comments)    Pt does  not tolerate this med UNKNOWN REACTION  . Cephalexin Nausea And Vomiting  . Codeine Other (See Comments)    Increased heart rate, anxiety  . Demerol [Meperidine] Other (See Comments)    Increased heart rate, anxiety  . Epinephrine Other (See Comments)    Increased heart rate  . Erythromycin Nausea And Vomiting  . Hyoscyamine Sulfate Other (See Comments)    Pt could not tolerate this med UNKNOWN reaction  . Inderal [Propranolol] Other (See Comments)    Extreme tiredness  . Lidocaine Other (See Comments)    Family hx of malignant hyperthermia  . Lipitor [Atorvastatin] Other (See Comments)    Muscle aches  . Losartan Potassium Other (See Comments)    Pt could not tolerate this med UNKNOWN REACTION  . Metoprolol Swelling  . Morphine Other (See Comments)    Nightmares, increased BP  . Nebivolol Other (See Comments)    angioedema  . Oxycodone-Acetaminophen     Other reaction(s): Tachycardia / Palpitations  (intolerance) Causes accelerated heart rate  . Paxil [Paroxetine Hcl] Other (See Comments)    "drove her up the wall"  . Penicillins Nausea And Vomiting  . Prednisone Other (See Comments)    Tolerates low doses if absolutely necessary - makes her nervous, jittery  . Propranolol Hcl Other (See Comments)    Pt could not tolerate this med UNKNOWN REACTION  . Sertraline Hcl Other (See Comments)    Pt could not tolerate this med UNKNOWN REACTION  . Tetracycline Nausea And Vomiting  . Toprol Xl [Metoprolol Succinate] Other (See Comments)    Extreme tiredness  . Verapamil Other (See Comments)    Pt could not tolerate this med UNKNOWN REACTION  . Hydralazine     headaches  . Oxycodone-Acetaminophen Palpitations    Causes accelerated heart rate    Social History   Socioeconomic History  . Marital status: Married    Spouse name: Harrie Jeans  . Number of children: 1  . Years of education: Not on file  . Highest education level: Not on file  Occupational History  . Occupation:  Retired    Fish farm manager: UNEMPLOYED  Social Needs  . Financial resource strain: Not on file  . Food insecurity:    Worry: Not on file    Inability: Not on file  . Transportation needs:    Medical: Not on file    Non-medical: Not on file  Tobacco Use  . Smoking status: Never Smoker  . Smokeless tobacco: Never Used  Substance and Sexual Activity  . Alcohol use: No    Alcohol/week: 0.0 oz  . Drug use: No  . Sexual activity: Not Currently    Birth control/protection: Surgical, Post-menopausal    Comment:  HYST-1st intercourse 72 yo-fewer than 5 partners  Lifestyle  . Physical activity:    Days per week: Not on file    Minutes per session: Not on file  . Stress: Not on file  Relationships  . Social connections:    Talks on phone: Not on file    Gets together: Not on file    Attends religious service: Not on file    Active member of club or organization: Not on file    Attends meetings of clubs or organizations: Not on file    Relationship status: Not on file  . Intimate partner violence:    Fear of current or ex partner: Not on file    Emotionally abused: Not on file    Physically abused: Not on file    Forced sexual activity: Not on file  Other Topics Concern  . 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 Heart failure Sister   . Heart disease Sister   . Hypertension Sister   . Dementia Mother   . Hypertension Mother   . Hypertension Father   . Arthritis Father   . Breast cancer Maternal Aunt        Age 22's  . Diabetes Maternal Aunt   . Diabetes Maternal Uncle   . Ovarian cancer Maternal Aunt   . Colon cancer Neg Hx     Review of Systems:  As stated in the HPI and otherwise negative.   BP (!) 142/86   Pulse 61   Ht 5\' 4"  (1.626 m)   Wt 128 lb 6.4 oz (58.2 kg)   SpO2 97%   BMI 22.04 kg/m   Physical  Examination:  General: Well developed, well nourished, NAD  HEENT: OP clear, mucus membranes moist  SKIN: warm, dry. No rashes. Neuro: No focal deficits  Musculoskeletal: Muscle strength 5/5 all ext  Psychiatric: Mood and affect normal  Neck: No JVD, no carotid bruits, no thyromegaly, no lymphadenopathy.  Lungs:Clear bilaterally, no wheezes, rhonci, crackles Cardiovascular: Regular rate and rhythm. No murmurs, gallops or rubs. Abdomen:Soft. Bowel sounds present. Non-tender.  Extremities: No lower extremity edema. Pulses are 2 + in the bilateral DP/PT.  Echo 05/18/16: Left ventricle: The cavity size was normal. Wall thickness was   normal. Systolic function was normal. The estimated ejection   fraction was in the range of 60% to 65%. Wall motion was normal;   there were no regional wall motion abnormalities. Doppler   parameters are consistent with abnormal left ventricular   relaxation (grade 1 diastolic dysfunction). The E/e&' ratio is   between 8-15, suggesting indeterminate LV filling pressure. - Mitral valve: Mildly thickened leaflets . There was trivial   regurgitation. - Left atrium: The atrium was normal in size. - Right ventricle: The cavity size was normal. Wall thickness was   normal. Pacer wire or catheter noted in right ventricle. Systolic   function was normal. - Right atrium: The atrium was normal in size. Pacer wire or   catheter noted in right atrium. - Tricuspid valve: There was trivial regurgitation. - Pulmonary arteries: PA peak pressure: 22 mm Hg (S). - Inferior vena cava: The vessel was normal in size. The   respirophasic diameter changes were in the normal range (= 50%),   consistent with normal central  venous pressure. - Pericardium, extracardiac: Small pericardial effusion. Features   were not consistent with tamponade physiology.  Impressions:  - LVEF 60-65%, normal wall thickness, normal wall motion, diastolic   dysfunction, indeterminate LV filling  pressure, pacer wires   noted, trivial TR, normal RVSP, normal IVC, small pericardial   effusion without tamponade physiology.  EKG:  EKG is ordered today. The ekg ordered today demonstrates Atrial paced, LAFB  Recent Labs: 03/31/2018: ALT 9; BUN 15; Creatinine, Ser 0.83; Hemoglobin 13.9; Platelets 200.0; Potassium 4.5; Sodium 136; TSH 2.25   Lipid Panel    Component Value Date/Time   CHOL 217 (H) 05/13/2017 1237   TRIG 78.0 05/13/2017 1237   HDL 63.20 05/13/2017 1237   CHOLHDL 3 05/13/2017 1237   VLDL 15.6 05/13/2017 1237   LDLCALC 139 (H) 05/13/2017 1237   LDLDIRECT 146.6 01/25/2014 0953     Wt Readings from Last 3 Encounters:  05/26/18 128 lb 6.4 oz (58.2 kg)  05/19/18 127 lb (57.6 kg)  03/31/18 127 lb 12 oz (57.9 kg)     Other studies Reviewed: Additional studies/ records that were reviewed today include: . Review of the above records demonstrates:    Assessment and Plan:   1. HTN: BP is slightly elevated today. She has intolerance to many drugs. She will follow her BP closely at home and if it remains elevated, will call back and we can increase her Cardizem dosing. No changes.    2. HLD: Followed in primary care. Statin intolerant.   3. Symptomatic bradycardia: Her pacemaker is followed by Dr. Rayann Heman  4. Palpitations/PVCs/PACs: Continue Cardizem.   Current medicines are reviewed at length with the patient today.  The patient does not have concerns regarding medicines.  The following changes have been made:  no change  Labs/ tests ordered today include:   No orders of the defined types were placed in this encounter.    Disposition:   FU with me in 12  months   Signed, Lauree Chandler, MD 05/26/2018 4:46 PM    [facility] [place], [place], [place]  [address] Phone: [phone]; Fax: [phone]

## 2018-05-26 NOTE — Patient Instructions (Signed)

## 2018-05-28 ENCOUNTER — Other Ambulatory Visit: Payer: Self-pay | Admitting: Family Medicine

## 2018-05-29 ENCOUNTER — Ambulatory Visit (INDEPENDENT_AMBULATORY_CARE_PROVIDER_SITE_OTHER): Payer: Medicare Other

## 2018-05-29 ENCOUNTER — Ambulatory Visit: Payer: Medicare Other

## 2018-05-29 VITALS — BP 140/82 | HR 90 | Temp 98.9°F | Ht 63.5 in | Wt 126.2 lb

## 2018-05-29 DIAGNOSIS — E7800 Pure hypercholesterolemia, unspecified: Secondary | ICD-10-CM

## 2018-05-29 DIAGNOSIS — Z0000 Encounter for general adult medical examination without abnormal findings: Secondary | ICD-10-CM | POA: Diagnosis not present

## 2018-05-29 LAB — LIPID PANEL
Cholesterol: 235 mg/dL — ABNORMAL HIGH (ref 0–200)
HDL: 66.4 mg/dL (ref >39.00)
LDL CALC: 148 mg/dL — AB (ref 0–99)
NONHDL: 168.3
Total CHOL/HDL Ratio: 4
Triglycerides: 101 mg/dL (ref 0.0–149.0)
VLDL: 20.2 mg/dL (ref 0.0–40.0)

## 2018-05-29 NOTE — Progress Notes (Signed)
PCP notes:   Health maintenance:  Tetanus vaccine - postponed/insurance  Abnormal screenings:   Depression score: 5 Depression screen [facility] 2/9 05/29/2018 05/13/2017 05/10/2016 05/10/2016 10/11/2014  Decreased Interest 1 0 0 0 -  Down, Depressed, Hopeless 1 0 0 0 2  PHQ - 2 Score 2 0 0 0 2  Altered sleeping 0 - - - 3  Tired, decreased energy 1 - - - 3  Change in appetite 0 - - - 0  Feeling bad or failure about yourself  1 - - - 1  Trouble concentrating 1 - - - 0  Moving slowly or fidgety/restless 0 - - - 1  Suicidal thoughts 0 - - - 0  PHQ-9 Score 5 - - - 10  Difficult doing work/chores Somewhat difficult - - - -  Some recent data might be hidden   Patient concerns:   Patient verbalized concerns about emotional health and elevated BP. States cardiologist is considering increasing dosage of BP medication.   Nurse concerns:  None  Next PCP appt:   06/10/18 @ 0945  I reviewed health advisor's note, was available for consultation on the day of service listed in this note, and agree with documentation and plan. Elsie Stain, MD.

## 2018-05-29 NOTE — Patient Instructions (Signed)
[name] , Thank you for taking time to come for your Medicare Wellness Visit. I appreciate your ongoing commitment to your health goals. Please review the following plan we discussed and let me know if I can assist you in the future.   These are the goals we discussed: Goals    . Increase physical activity     Starting 05/30/2018, I will begin riding recumbent bike for 10-15 minutes 4-5 days per week.        This is a list of the screening recommended for you and due dates:  Health Maintenance  Topic Date Due  . Tetanus Vaccine  05/30/2019*  . Flu Shot  07/03/2018  . Mammogram  03/18/2020  . Colon Cancer Screening  07/23/2022  . DEXA scan (bone density measurement)  Completed  .  Hepatitis C: One time screening is recommended by Center for Disease Control  (CDC) for  adults born from [birth year] through 1964.   Completed  . Pneumonia vaccines  Completed  *Topic was postponed. The date shown is not the original due date.   Preventive Care for Adults  A healthy lifestyle and preventive care can promote health and wellness. Preventive health guidelines for adults include the following key practices.  . A routine yearly physical is a good way to check with your health care provider about your health and preventive screening. It is a chance to share any concerns and updates on your health and to receive a thorough exam.  . Visit your dentist for a routine exam and preventive care every 6 months. Brush your teeth twice a day and floss once a day. Good oral hygiene prevents tooth decay and gum disease.  . The frequency of eye exams is based on your age, health, family medical history, use  of contact lenses, and other factors. Follow your health care provider's recommendations for frequency of eye exams.  . Eat a healthy diet. Foods like vegetables, fruits, whole grains, low-fat dairy products, and lean protein foods contain the nutrients you need without too many calories. Decrease your  intake of foods high in solid fats, added sugars, and salt. Eat the right amount of calories for you. Get information about a proper diet from your health care provider, if necessary.  . Regular physical exercise is one of the most important things you can do for your health. Most adults should get at least 150 minutes of moderate-intensity exercise (any activity that increases your heart rate and causes you to sweat) each week. In addition, most adults need muscle-strengthening exercises on 2 or more days a week.  Silver Sneakers may be a benefit available to you. To determine eligibility, you may visit the website: www.silversneakers.com or contact program at [phone])750-9534 Mon-Fri between 8AM-8PM.   . Maintain a healthy weight. The body mass index (BMI) is a screening tool to identify possible weight problems. It provides an estimate of body fat based on height and weight. Your health care provider can find your BMI and can help you achieve or maintain a healthy weight.   For adults 20 years and older: ? A BMI below 18.5 is considered underweight. ? A BMI of 18.5 to 24.9 is normal. ? A BMI of 25 to 29.9 is considered overweight. ? A BMI of 30 and above is considered obese.   . Maintain normal blood lipids and cholesterol levels by exercising and minimizing your intake of saturated fat. Eat a balanced diet with plenty of fruit and vegetables. Blood tests for  lipids and cholesterol should begin at age 20 and be repeated every 5 years. If your lipid or cholesterol levels are high, you are over 50, or you are at high risk for heart disease, you may need your cholesterol levels checked more frequently. Ongoing high lipid and cholesterol levels should be treated with medicines if diet and exercise are not working.  . If you smoke, find out from your health care provider how to quit. If you do not use tobacco, please do not start.  . If you choose to drink alcohol, please do not consume more than 2  drinks per day. One drink is considered to be 12 ounces (355 mL) of beer, 5 ounces (148 mL) of wine, or 1.5 ounces (44 mL) of liquor.  . If you are 42-25 years old, ask your health care provider if you should take aspirin to prevent strokes.  . Use sunscreen. Apply sunscreen liberally and repeatedly throughout the day. You should seek shade when your shadow is shorter than you. Protect yourself by wearing long sleeves, pants, a wide-brimmed hat, and sunglasses year round, whenever you are outdoors.  . Once a month, do a whole body skin exam, using a mirror to look at the skin on your back. Tell your health care provider of new moles, moles that have irregular borders, moles that are larger than a pencil eraser, or moles that have changed in shape or color.

## 2018-05-29 NOTE — Progress Notes (Signed)
Subjective:   [name] is a 72 y.o. female who presents for Medicare Annual (Subsequent) preventive examination.  Review of Systems:  N/A Cardiac Risk Factors include: advanced age (>47men, >91 women)     Objective:     Vitals: BP 140/82 (BP Location: Left Arm, Patient Position: Sitting, Cuff Size: Normal)   Pulse 90   Temp 98.9 F (37.2 C) (Oral)   Ht 5' 3.5" (1.613 m) Comment: no shoes  Wt 126 lb 4 oz (57.3 kg)   SpO2 98%   BMI 22.01 kg/m   Body mass index is 22.01 kg/m.  Advanced Directives 05/29/2018 09/26/2017 09/26/2017 05/13/2017 05/10/2016 05/10/2016 01/13/2015  Does Patient Have a Medical Advance Directive? Yes No No Yes Yes Yes No  Type of Paramedic of [place];Living will - - [place];Living will [place];Living will [place];Living will -  Does patient want to make changes to medical advance directive? - - - - No - Patient declined No - Patient declined -  Copy of [place] in Chart? Yes - - No - copy requested No - copy requested No - copy requested -  Would patient like information on creating a medical advance directive? - No - Patient declined No - Patient declined - - - No - patient declined information    Tobacco Social History   Tobacco Use  Smoking Status Never Smoker  Smokeless Tobacco Never Used     Counseling given: No   Clinical Intake:  Pre-visit preparation completed: Yes  Pain : No/denies pain Pain Score: 3  Pain Type: Chronic pain Pain Location: Generalized Pain Onset: More than a month ago Pain Frequency: Constant     Nutritional Status: BMI 25 -29 Overweight Nutritional Risks: None Diabetes: No  How often do you need to have someone help you when you read instructions, pamphlets, or other written materials from your doctor or pharmacy?: 1 - Never What is the last grade level you completed in school?: 12th grade  Interpreter  Needed?: No  Comments: pt lives with spouse Information entered by :: LPinson, LPN  Past Medical History:  Diagnosis Date  . Adenomatous colon polyp 12/2006  . Allergic rhinitis   . Anemia   . Arthritis   . Bradycardia    s/p MDT PPM  . Depression   . Esophageal stricture   . GERD (gastroesophageal reflux disease)   . Hemorrhoids   . Hyperlipidemia   . Hypertension   . Inguinal hernia   . Osteopenia 12/2012   T score -1.7 FRAX 8.5%/1.1%  . PVC (premature ventricular contraction)   . Scleritis of right eye   . Skin cancer    Basal and squamous cell  . Sleep apnea    a. intolerant of CPAP  . Subdural hematoma ([place])    a. spontanous 09/2017 b. resolved without intervention  . Thyroid disease   . Vitreous detachment of both eyes    Past Surgical History:  Procedure Laterality Date  . ABDOMINAL HYSTERECTOMY    . BREAST SURGERY     Biopsy-benign  . CESAREAN SECTION    . DILATION AND CURETTAGE OF UTERUS    . ESOPHAGOGASTRODUODENOSCOPY     esophageal stretching  . HAND SURGERY  1992  . HERNIA REPAIR    . INGUINAL HERNIA REPAIR  02/12/1994   left inguinal hernia repair  . KNEE ARTHROSCOPY  2011  . OOPHORECTOMY  1995   LSO and RSO  .  PACEMAKER GENERATOR CHANGE N/A 01/21/2015   Procedure: PACEMAKER GENERATOR CHANGE;  Surgeon: [name], MD;  Location: [facility] CATH LAB;  Service: Cardiovascular;  Laterality: N/A;  . PACEMAKER INSERTION  2006   Dr. Verlon Setting  . POLYPECTOMY    . Tear duct surgery  2010  . THUMB SURGERY    . THYROIDECTOMY  1979   RIGHT SIDE  . TOTAL KNEE ARTHROPLASTY Right 12/20/2014   Procedure: RIGHT TOTAL KNEE ARTHROPLASTY;  Surgeon: Vickey Huger, MD;  Location: [place];  Service: Orthopedics;  Laterality: Right;   Family History  Problem Relation Age of Onset  . Heart failure Sister   . Heart disease Sister   . Hypertension Sister   . Dementia Mother   . Hypertension Mother   . Hypertension Father   . Arthritis Father   . Breast cancer Maternal Aunt         Age 5's  . Diabetes Maternal Aunt   . Diabetes Maternal Uncle   . Ovarian cancer Maternal Aunt   . Colon cancer Neg Hx    Social History   Socioeconomic History  . Marital status: Married    Spouse name: Harrie Jeans  . Number of children: 1  . Years of education: Not on file  . Highest education level: Not on file  Occupational History  . Occupation: Retired    Fish farm manager: UNEMPLOYED  Social Needs  . Financial resource strain: Not on file  . Food insecurity:    Worry: Not on file    Inability: Not on file  . Transportation needs:    Medical: Not on file    Non-medical: Not on file  Tobacco Use  . Smoking status: Never Smoker  . Smokeless tobacco: Never Used  Substance and Sexual Activity  . Alcohol use: No    Alcohol/week: 0.0 oz  . Drug use: No  . Sexual activity: Not Currently    Birth control/protection: Surgical, Post-menopausal    Comment: HYST-1st intercourse 73 yo-fewer than 5 partners  Lifestyle  . Physical activity:    Days per week: Not on file    Minutes per session: Not on file  . Stress: Not on file  Relationships  . Social connections:    Talks on phone: Not on file    Gets together: Not on file    Attends religious service: Not on file    Active member of club or organization: Not on file    Attends meetings of clubs or organizations: Not on file    Relationship status: Not on file  Other Topics Concern  . 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 acetaminophen (TYLENOL) 500 MG tablet Take 1,000 mg by mouth every 6 (six) hours as needed (pain).  . Carboxymethylcell-Hypromellose (GENTEAL) 0.25-0.3 % GEL Place 1 application into both eyes at bedtime.  Marland Kitchen diltiazem (CARDIZEM CD) 120 MG 24 hr capsule TAKE 1 CAPSULE BY MOUTH ONCE DAILY  . LORazepam (ATIVAN) 1 MG tablet TAKE 1  TABLET BY MOUTH 4 TIMES DAILY  . nitroGLYCERIN (NITROSTAT) 0.4 MG SL tablet Place 1 tablet (0.4 mg total) under the tongue every 5 (five) minutes as needed. For up to 3 doses  . nizatidine (AXID) 150 MG capsule TAKE (1) CAPSULE TWICE DAILY.  Vladimir Faster Glycol-Propyl Glycol (SYSTANE OP)  Place 1 drop into both eyes daily as needed (dry eyes).  . traMADol (ULTRAM) 50 MG tablet Take 1 tablet (50 mg total) by mouth every 6 (six) hours as needed (pain).   No facility-administered encounter medications on file as of 05/29/2018.     Activities of Daily Living In your present state of health, do you have any difficulty performing the following activities: 05/29/2018 09/26/2017  Hearing? N N  Vision? N N  Difficulty concentrating or making decisions? N N  Walking or climbing stairs? N N  Dressing or bathing? N N  Doing errands, shopping? N Y  Conservation officer, nature and eating ? N -  Using the Toilet? N -  In the past six months, have you accidently leaked urine? Y -  Do you have problems with loss of bowel control? N -  Managing your Medications? N -  Managing your Finances? N -  Housekeeping or managing your Housekeeping? N -  Some recent data might be hidden    Patient Care Team: Tonia Ghent, MD as PCP - General (Family Medicine) Darlin Coco, MD (Cardiology) [name], MD as Attending Physician (Cardiology) Deneise Lever, MD (Pulmonary Disease) Vickey Huger, MD (Orthopedic Surgery) Burnell Blanks, MD as Consulting Physician (Cardiology) Jarome Matin, MD as Consulting Physician (Dermatology) Phineas Real Belinda Block, MD as Consulting Physician (Gynecology) Luberta Mutter, MD as Consulting Physician (Ophthalmology)    Assessment:   This is a routine wellness examination for Mikinzie.   Hearing Screening   125Hz  250Hz  500Hz  1000Hz  2000Hz  3000Hz  4000Hz  6000Hz  8000Hz   Right ear:   40 40 40  40    Left ear:   40 40 40  40    Vision Screening Comments: Feb 2019 @ [facility]  Ophthalmology    Exercise Activities and Dietary recommendations Current Exercise Habits: The patient does not participate in regular exercise at present, Exercise limited by: None identified  Goals    . Increase physical activity     Starting 05/30/2018, I will begin riding recumbent bike for 10-15 minutes 4-5 days per week.        Fall Risk Fall Risk  05/29/2018 05/13/2017 05/10/2016 05/10/2016 10/11/2014  Falls in the past year? No No No No No   Depression Screen PHQ 2/9 Scores 05/29/2018 05/13/2017 05/10/2016 05/10/2016  PHQ - 2 Score 2 0 0 0  PHQ- 9 Score 5 - - -     Cognitive Function MMSE - Mini Mental State Exam 05/29/2018 05/13/2017 05/10/2016  Orientation to time 5 5 5   Orientation to Place 5 5 5   Registration 3 3 3   Attention/ Calculation 0 0 0  Recall 3 3 3   Language- name 2 objects 0 0 0  Language- repeat 1 1 1   Language- follow 3 step command 3 3 3   Language- read & follow direction 0 0 0  Write a sentence 0 0 0  Copy design 0 0 0  Total score 20 20 20      PLEASE NOTE: A Mini-Cog screen was completed. Maximum score is 20. A value of 0 denotes this part of [place] MMSE was not completed or the patient failed this part of the Mini-Cog screening.   Mini-Cog Screening Orientation to Time - Max 5 pts Orientation to Place - Max 5 pts Registration - Max 3 pts Recall - Max 3 pts Language Repeat - Max 1 pts Language Follow 3 Step Command - Max 3 pts     Immunization History  Administered Date(s) Administered  .  Influenza Split 08/21/2012  . Influenza,inj,Quad PF,6+ Mos 09/11/2013, 08/25/2014, 09/05/2015, 09/03/2016, 09/03/2017  . Pneumococcal Conjugate-13 09/05/2015  . Pneumococcal Polysaccharide-23 10/11/2014  . Tdap 07/04/2007  . Zoster 06/04/2012    Screening Tests Health Maintenance  Topic Date Due  . TETANUS/TDAP  05/30/2019 (Originally 07/03/2017)  . INFLUENZA VACCINE  07/03/2018  . MAMMOGRAM  03/18/2020  . COLONOSCOPY  07/23/2022  . DEXA SCAN  Completed    . Hepatitis C Screening  Completed  . PNA vac Low Risk Adult  Completed       Plan:     I have personally reviewed, addressed, and noted the following in the patient's chart:  A. Medical and social history B. Use of alcohol, tobacco or illicit drugs  C. Current medications and supplements D. Functional ability and status E.  Nutritional status F.  Physical activity G. Advance directives H. List of other physicians I.  Hospitalizations, surgeries, and ER visits in previous 12 months J.  [place] to include hearing, vision, cognitive, depression L. Referrals and appointments - none  In addition, I have reviewed and discussed with patient certain preventive protocols, quality metrics, and best practice recommendations. A written personalized care plan for preventive services as well as general preventive health recommendations were provided to patient.  See attached scanned questionnaire for additional information.   Signed,   Lindell Noe, MHA, BS, LPN Health Coach

## 2018-06-09 ENCOUNTER — Other Ambulatory Visit: Payer: Self-pay | Admitting: Cardiovascular Disease

## 2018-06-09 DIAGNOSIS — I119 Hypertensive heart disease without heart failure: Secondary | ICD-10-CM

## 2018-06-10 ENCOUNTER — Encounter: Payer: Self-pay | Admitting: Family Medicine

## 2018-06-10 ENCOUNTER — Ambulatory Visit (INDEPENDENT_AMBULATORY_CARE_PROVIDER_SITE_OTHER): Payer: Medicare Other | Admitting: Family Medicine

## 2018-06-10 VITALS — BP 138/82 | HR 62 | Temp 98.4°F | Ht 63.5 in | Wt 127.2 lb

## 2018-06-10 DIAGNOSIS — Z950 Presence of cardiac pacemaker: Secondary | ICD-10-CM | POA: Diagnosis not present

## 2018-06-10 DIAGNOSIS — I10 Essential (primary) hypertension: Secondary | ICD-10-CM | POA: Diagnosis not present

## 2018-06-10 DIAGNOSIS — G4733 Obstructive sleep apnea (adult) (pediatric): Secondary | ICD-10-CM

## 2018-06-10 DIAGNOSIS — K222 Esophageal obstruction: Secondary | ICD-10-CM | POA: Diagnosis not present

## 2018-06-10 DIAGNOSIS — Z0000 Encounter for general adult medical examination without abnormal findings: Secondary | ICD-10-CM

## 2018-06-10 DIAGNOSIS — Z7189 Other specified counseling: Secondary | ICD-10-CM

## 2018-06-10 DIAGNOSIS — F419 Anxiety disorder, unspecified: Secondary | ICD-10-CM | POA: Diagnosis not present

## 2018-06-10 DIAGNOSIS — S065XAA Traumatic subdural hemorrhage with loss of consciousness status unknown, initial encounter: Secondary | ICD-10-CM

## 2018-06-10 DIAGNOSIS — S065X9A Traumatic subdural hemorrhage with loss of consciousness of unspecified duration, initial encounter: Secondary | ICD-10-CM

## 2018-06-10 NOTE — Progress Notes (Signed)
72 yo female with history of sleep apnea- she didn't tolerate CPAP but has a mouthpiece and that helps.  She grinds her teeth some and will f/u with the dental clinic.   GERD/hiatal hernia/esopagheal stricture still on axid. Compliant. No ADE on med.   Symptomatic bradycardia s/p pacemaker.  Echo June 2017 with normal LV function and small pericardial effusion with no tamponade. She is known to have PACs and PVCs.    She had a spontaneous subdural hematoma in October 2018 that did not require surgical intervention.  She has seen Dr. Rita Ohara in the meantime.  She is off Keppra now.  She feels some better but not fully back to baseline.  "It's like something about me changed."  She is easier to cry now, she can be more irritable.  D/w pt.  She is managing and tolerating the situation.  She is more anxious in general since the event.  D/w pt about fall cautions.  She still has occ headaches, discussed cautions and routine instructions regarding follow-up, if she had a severe headache.  Hypertension:    Using medication without problems or lightheadedness: yes Chest pain with exertion:no Edema:rare, occ, minimal.   Short of breath:no  Anxiety.  PRN BZD use.  Usually dosed about TID, occ QID dosing.  Usually a dose at night for insomnia which is long standing.  Intolerant of mult meds, inc SSRI.  It likely makes sense to consider counseling.  See avs.    Rare use of tramadol.  No ADE on med.   Flu 2018 Shingles 2013 PNA utd Tetanus 2008 Colonoscopy 2018 Breast cancer screening 2019 Pap not indicated.  DXA per gyn. I'll defer.   Advance directive- d/w pt.  Husband designated if patient were incapacitated.    PMH and SH reviewed  ROS: Per HPI unless specifically indicated in ROS section   Meds, vitals, and allergies reviewed.   GEN: nad, alert and oriented HEENT: mucous membranes moist NECK: supple w/o LA CV: rrr PULM: ctab, no inc wob ABD: soft, +bs EXT: no edema SKIN: no acute  rash

## 2018-06-10 NOTE — Patient Instructions (Addendum)
Ask about counseling at [facility] Address: [address], [place], [place] [address] Phone: [phone]  Check with your insurance to see if they will cover the tdap/tetanus shot. Call about follow up with GYN and see if they are going to address your bone density test.   Update me as needed.  Take care.  Glad to see you.

## 2018-06-11 DIAGNOSIS — I10 Essential (primary) hypertension: Secondary | ICD-10-CM | POA: Insufficient documentation

## 2018-06-11 NOTE — Assessment & Plan Note (Signed)
she didn't tolerate CPAP but has a mouthpiece and that helps.  She grinds her teeth some and will f/u with the dental clinic.

## 2018-06-11 NOTE — Assessment & Plan Note (Signed)
Symptomatic bradycardia s/p pacemaker.  Echo June 2017 with normal LV function and small pericardial effusion with no tamponade. She is known to have PACs and PVCs.   per cards.

## 2018-06-11 NOTE — Assessment & Plan Note (Signed)
Advance directive- d/w pt. Husband designated if patient were incapacitated.  

## 2018-06-11 NOTE — Assessment & Plan Note (Signed)
GERD/hiatal hernia/esopagheal stricture still on axid. Compliant. No ADE on med.  Continue as is.

## 2018-06-11 NOTE — Assessment & Plan Note (Signed)
Flu 2018 Shingles 2013 PNA utd Tetanus 2008 Colonoscopy 2018 Breast cancer screening 2019 Pap not indicated.  DXA per gyn. I'll defer.   Advance directive- d/w pt.  Husband designated if patient were incapacitated.

## 2018-06-11 NOTE — Assessment & Plan Note (Signed)
No change in meds.  Reasonable control.  Continue as is.  She agrees.

## 2018-06-11 NOTE — Assessment & Plan Note (Signed)
PRN BZD use.  Usually dosed about TID prn, occ QID dosing.  Usually a dose at night for insomnia which is long standing.  Intolerant of mult meds, inc SSRI.  It likely makes sense to consider counseling.  See avs.

## 2018-06-11 NOTE — Assessment & Plan Note (Signed)
She had a spontaneous subdural hematoma in October 2018 that did not require surgical intervention.  She has seen Dr. Rita Ohara in the meantime.  She is off Keppra now.  She feels some better but not fully back to baseline.  "It's like something about me changed."  She is easier to cry now, she can be more irritable.  D/w pt.  She is managing and tolerating the situation.  She is more anxious in general since the event.  D/w pt about fall cautions.  She still has occ headaches, discussed cautions and routine instructions regarding follow-up, if she had a severe headache. Continue clinical observation.

## 2018-06-29 ENCOUNTER — Other Ambulatory Visit: Payer: Self-pay | Admitting: Family Medicine

## 2018-07-16 ENCOUNTER — Other Ambulatory Visit: Payer: Self-pay | Admitting: Family Medicine

## 2018-07-16 NOTE — Telephone Encounter (Signed)
Sent. Thanks.   

## 2018-07-16 NOTE — Telephone Encounter (Signed)
Electronic refill request Last refill 03/23/18 #120/2 Last office visit 06/10/18

## 2018-07-31 ENCOUNTER — Ambulatory Visit (INDEPENDENT_AMBULATORY_CARE_PROVIDER_SITE_OTHER): Payer: Medicare Other | Admitting: *Deleted

## 2018-07-31 DIAGNOSIS — I495 Sick sinus syndrome: Secondary | ICD-10-CM | POA: Diagnosis not present

## 2018-07-31 DIAGNOSIS — I10 Essential (primary) hypertension: Secondary | ICD-10-CM

## 2018-07-31 NOTE — Progress Notes (Signed)
Remote pacemaker transmission.   

## 2018-08-01 NOTE — Progress Notes (Signed)
disregard

## 2018-08-21 LAB — CUP PACEART REMOTE DEVICE CHECK
Battery Remaining Longevity: 136 mo
Battery Voltage: 2.78 V
Brady Statistic AP VP Percent: 0 %
Brady Statistic AS VP Percent: 0 %
Implantable Lead Implant Date: 20060620
Implantable Lead Model: 5076
Implantable Pulse Generator Implant Date: 20160219
Lead Channel Impedance Value: 614 Ohm
Lead Channel Pacing Threshold Amplitude: 0.75 V
Lead Channel Pacing Threshold Amplitude: 0.75 V
Lead Channel Setting Pacing Amplitude: 2 V
Lead Channel Setting Sensing Sensitivity: 5.6 mV
MDC IDC LEAD IMPLANT DT: 20060620
MDC IDC LEAD LOCATION: 753859
MDC IDC LEAD LOCATION: 753860
MDC IDC MSMT BATTERY IMPEDANCE: 156 Ohm
MDC IDC MSMT LEADCHNL RA PACING THRESHOLD PULSEWIDTH: 0.4 ms
MDC IDC MSMT LEADCHNL RV IMPEDANCE VALUE: 606 Ohm
MDC IDC MSMT LEADCHNL RV PACING THRESHOLD PULSEWIDTH: 0.4 ms
MDC IDC SESS DTM: 20190829123901
MDC IDC SET LEADCHNL RV PACING AMPLITUDE: 2.5 V
MDC IDC SET LEADCHNL RV PACING PULSEWIDTH: 0.4 ms
MDC IDC STAT BRADY AP VS PERCENT: 78 %
MDC IDC STAT BRADY AS VS PERCENT: 22 %

## 2018-08-28 ENCOUNTER — Ambulatory Visit (INDEPENDENT_AMBULATORY_CARE_PROVIDER_SITE_OTHER): Payer: Medicare Other

## 2018-08-28 DIAGNOSIS — Z23 Encounter for immunization: Secondary | ICD-10-CM | POA: Diagnosis not present

## 2018-09-29 ENCOUNTER — Encounter: Payer: Medicare Other | Admitting: Gynecology

## 2018-11-05 ENCOUNTER — Ambulatory Visit (INDEPENDENT_AMBULATORY_CARE_PROVIDER_SITE_OTHER): Payer: Medicare Other

## 2018-11-05 DIAGNOSIS — I495 Sick sinus syndrome: Secondary | ICD-10-CM

## 2018-11-05 NOTE — Progress Notes (Signed)
Remote pacemaker transmission.   

## 2018-11-11 ENCOUNTER — Encounter: Payer: Self-pay | Admitting: Cardiology

## 2018-11-12 ENCOUNTER — Other Ambulatory Visit: Payer: Self-pay | Admitting: Family Medicine

## 2018-11-12 NOTE — Telephone Encounter (Signed)
Sent. Thanks.   

## 2018-11-12 NOTE — Telephone Encounter (Signed)
Electronic refill request Lorazepam Last refill 07/16/18 #120/2 Last office visit 06/10/18 Upcoming appointment 04/17/19 AMW

## 2018-11-20 ENCOUNTER — Ambulatory Visit (INDEPENDENT_AMBULATORY_CARE_PROVIDER_SITE_OTHER): Payer: Medicare Other | Admitting: Gynecology

## 2018-11-20 ENCOUNTER — Encounter: Payer: Self-pay | Admitting: Gynecology

## 2018-11-20 VITALS — BP 124/84 | Ht 64.0 in | Wt 128.0 lb

## 2018-11-20 DIAGNOSIS — Z01419 Encounter for gynecological examination (general) (routine) without abnormal findings: Secondary | ICD-10-CM

## 2018-11-20 DIAGNOSIS — M8580 Other specified disorders of bone density and structure, unspecified site: Secondary | ICD-10-CM

## 2018-11-20 DIAGNOSIS — N952 Postmenopausal atrophic vaginitis: Secondary | ICD-10-CM | POA: Diagnosis not present

## 2018-11-20 MED ORDER — TERCONAZOLE 0.4 % VA CREA: 1.0000 | TOPICAL_CREAM | Freq: Every day | VAGINAL | 2 refills | Status: DC

## 2018-11-20 NOTE — Progress Notes (Signed)
    [name] 17-Sep-1946 [id number]        71 y.o.  G2P1011 for annual gynecologic exam.  Doing well without gynecologic complaints  Past medical history,surgical history, problem list, medications, allergies, family history and social history were all reviewed and documented as reviewed in the EPIC chart.  ROS:  Performed with pertinent positives and negatives included in the history, assessment and plan.   Additional significant findings : None   Exam: Caryn Bee assistant Vitals:   11/20/18 1233  BP: 124/84  Weight: 128 lb (58.1 kg)  Height: 5\' 4"  (1.626 m)   Body mass index is 21.97 kg/m.  General appearance:  Normal affect, orientation and appearance. Skin: Grossly normal HEENT: Without gross lesions.  No cervical or supraclavicular adenopathy. Thyroid normal.  Lungs:  Clear without wheezing, rales or rhonchi Cardiac: RR, without RMG Abdominal:  Soft, nontender, without masses, guarding, rebound, organomegaly or hernia Breasts:  Examined lying and sitting without masses, retractions, discharge or axillary adenopathy. Pelvic:  Ext, BUS, Vagina: With atrophic changes  Adnexa: Without masses or tenderness    Anus and perineum: Normal   Rectovaginal: Normal sphincter tone without palpated masses or tenderness.    Assessment/Plan:  72 y.o. G61P1011 female for annual gynecologic exam.   1. Postmenopausal.  Status post TVH BSO in the past.  Has intermittent vaginal dryness and irritation.  Uses a small amount of Terazol cream intermittently with relief of her symptoms.  We discussed alternatives to include vaginal estrogen and she is not interested.  I refilled her Terazol cream x2. 2. Osteopenia.  DEXA 2014 T score -1.7 FRAX 8.5% / 1.1%.  Recommended repeat DEXA now.  Patient reluctant stating she would not consider medication even if it showed osteoporosis.  I reviewed the issues of fracture risk and the devastating sequela with a hip fracture.  I reviewed there are many  different types of medication possible noting she does have a history of GI upset.  At this point the patient's not interested in scheduling but will call back if she changes her mind. 3. Mammography 03/2018.  Continue with annual mammography when due.  Breast exam normal today. 4. Colonoscopy 2018.  Repeat at their recommended interval. 5. Pap smear 2012.  No Pap smear done today.  No history of significant abnormal Pap smears.  We both agree to stop screening per current screening guidelines based on age and hysterectomy history. 6. Health maintenance.  No routine lab work done as patient does this elsewhere.  Follow-up 1 year, sooner as needed.   Anastasio Auerbach MD, 1:06 PM 11/20/2018

## 2018-11-20 NOTE — Patient Instructions (Signed)
Follow-up in 1 year for annual exam, sooner if any issues.  Call if you change your mind and want to schedule the bone density.

## 2018-11-20 NOTE — Addendum Note (Signed)
Addended by: Anastasio Auerbach on: 11/20/2018 01:11 PM   Modules accepted: Orders

## 2018-12-02 ENCOUNTER — Encounter: Payer: Medicare Other | Admitting: Gynecology

## 2018-12-07 LAB — CUP PACEART REMOTE DEVICE CHECK
Battery Impedance: 179 Ohm
Brady Statistic AP VP Percent: 0 %
Brady Statistic AP VS Percent: 77 %
Brady Statistic AS VP Percent: 0 %
Brady Statistic AS VS Percent: 23 %
Date Time Interrogation Session: 20191204145211
Implantable Lead Implant Date: 20060620
Implantable Lead Location: 753859
Implantable Lead Model: 5076
Implantable Lead Model: 5076
Implantable Pulse Generator Implant Date: 20160219
Lead Channel Impedance Value: 593 Ohm
Lead Channel Impedance Value: 693 Ohm
Lead Channel Pacing Threshold Amplitude: 0.75 V
Lead Channel Pacing Threshold Amplitude: 0.75 V
Lead Channel Pacing Threshold Pulse Width: 0.4 ms
Lead Channel Setting Pacing Amplitude: 2 V
Lead Channel Setting Pacing Amplitude: 2.5 V
Lead Channel Setting Sensing Sensitivity: 5.6 mV
MDC IDC LEAD IMPLANT DT: 20060620
MDC IDC LEAD LOCATION: 753860
MDC IDC MSMT BATTERY REMAINING LONGEVITY: 132 mo
MDC IDC MSMT BATTERY VOLTAGE: 2.78 V
MDC IDC MSMT LEADCHNL RV PACING THRESHOLD PULSEWIDTH: 0.4 ms
MDC IDC SET LEADCHNL RV PACING PULSEWIDTH: 0.4 ms

## 2018-12-08 ENCOUNTER — Encounter: Payer: Self-pay | Admitting: Cardiovascular Disease

## 2018-12-08 ENCOUNTER — Ambulatory Visit (INDEPENDENT_AMBULATORY_CARE_PROVIDER_SITE_OTHER): Payer: Medicare Other | Admitting: Cardiovascular Disease

## 2018-12-08 VITALS — BP 138/88 | HR 65 | Ht 64.0 in | Wt 129.8 lb

## 2018-12-08 DIAGNOSIS — I495 Sick sinus syndrome: Secondary | ICD-10-CM | POA: Diagnosis not present

## 2018-12-08 DIAGNOSIS — I491 Atrial premature depolarization: Secondary | ICD-10-CM

## 2018-12-08 DIAGNOSIS — I10 Essential (primary) hypertension: Secondary | ICD-10-CM | POA: Diagnosis not present

## 2018-12-08 NOTE — Progress Notes (Signed)
Chief Complaint  Patient presents with  . Follow-up    HTN   History of Present Illness: 73 yo female with history of sleep apnea, HLD, HTN, GERD/hiatal hernia/esopagheal stricture, symptomatic bradycardia s/p pacemaker, PACs, PVCs who is here today for cardiac follow up. She had been followed by Dr. Mare Ferrari for general cardiology issues. Her pacemaker is followed by Dr. Rayann Heman. The pacemaker was changed in 2016 due to end of battery life. Echo June 2017 with normal LV function and small pericardial effusion with no tamponade. She is known to have PACs and PVCs.  She had a spontaneous subdural hematoma in October 2018 that did not require surgical intervention.   She is here today for follow up. The patient denies any chest pain, dyspnea, palpitations, lower extremity edema, orthopnea, PND, dizziness, near syncope or syncope.   Primary Care Physician: Tonia Ghent, MD  Past Medical History:  Diagnosis Date  . Adenomatous colon polyp 12/2006  . Allergic rhinitis   . Anemia   . Arthritis   . Bradycardia    s/p MDT PPM  . Depression   . Esophageal stricture   . GERD (gastroesophageal reflux disease)   . Hemorrhoids   . Hyperlipidemia   . Hypertension   . Inguinal hernia   . Osteopenia 12/2012   T score -1.7 FRAX 8.5%/1.1%  . PVC (premature ventricular contraction)   . Scleritis of right eye   . Skin cancer    Basal and squamous cell  . Sleep apnea    a. intolerant of CPAP  . Subdural hematoma ([place])    a. spontanous 09/2017 b. resolved without intervention  . Thyroid disease   . Vitreous detachment of both eyes     Past Surgical History:  Procedure Laterality Date  . ABDOMINAL HYSTERECTOMY    . BREAST SURGERY     Biopsy-benign  . CESAREAN SECTION    . DILATION AND CURETTAGE OF UTERUS    . ESOPHAGOGASTRODUODENOSCOPY     esophageal stretching  . HAND SURGERY  1992  . HERNIA REPAIR    . INGUINAL HERNIA REPAIR  02/12/1994   left inguinal hernia repair  . KNEE  ARTHROSCOPY  2011  . OOPHORECTOMY  1995   LSO and RSO  . PACEMAKER GENERATOR CHANGE N/A 01/21/2015   Procedure: PACEMAKER GENERATOR CHANGE;  Surgeon: [name], MD;  Location: [facility] CATH LAB;  Service: Cardiovascular;  Laterality: N/A;  . PACEMAKER INSERTION  2006   Dr. Verlon Setting  . POLYPECTOMY    . Tear duct surgery  2010  . THUMB SURGERY    . THYROIDECTOMY  1979   RIGHT SIDE  . TOTAL KNEE ARTHROPLASTY Right 12/20/2014   Procedure: RIGHT TOTAL KNEE ARTHROPLASTY;  Surgeon: Vickey Huger, MD;  Location: [place];  Service: Orthopedics;  Laterality: Right;    Current Outpatient Medications  Medication Sig Dispense Refill  . acetaminophen (TYLENOL) 500 MG tablet Take 1,000 mg by mouth every 6 (six) hours as needed (pain).    . Carboxymethylcell-Hypromellose (GENTEAL) 0.25-0.3 % GEL Place 1 application into both eyes at bedtime.    Marland Kitchen CARTIA XT 120 MG 24 hr capsule TAKE 1 CAPSULE BY MOUTH ONCE DAILY 90 capsule 3  . LORazepam (ATIVAN) 1 MG tablet TAKE 1 TABLET BY MOUTH 4 TIMES DAILY 120 tablet 2  . nitroGLYCERIN (NITROSTAT) 0.4 MG SL tablet Place 1 tablet (0.4 mg total) under the tongue every 5 (five) minutes as needed. For up to 3 doses 25 tablet 6  . nizatidine (  AXID) 150 MG capsule TAKE (1) CAPSULE TWICE DAILY. 60 capsule 12  . nizatidine (AXID) 150 MG capsule TAKE (1) CAPSULE TWICE DAILY. 60 capsule 5  . Polyethyl Glycol-Propyl Glycol (SYSTANE OP) Place 1 drop into both eyes daily as needed (dry eyes).    Marland Kitchen terconazole (TERAZOL 7) 0.4 % vaginal cream Place 1 applicator vaginally at bedtime. As needed for irritation 45 g 2  . traMADol (ULTRAM) 50 MG tablet Take 1 tablet (50 mg total) by mouth every 6 (six) hours as needed (pain). 60 tablet 0   No current facility-administered medications for this visit.     Allergies  Allergen Reactions  . Hydrocodone     Increased HR  . Antihistamines, Loratadine-Type Other (See Comments)    Heart races/jittery  . Bisoprolol-Hydrochlorothiazide Other (See  Comments)    Couldn't tolerate UNKNOWN REACTION  . Bystolic [Nebivolol Hcl] Other (See Comments)    Pt does not tolerate this med UNKNOWN REACTION  . Cephalexin Nausea And Vomiting  . Codeine Other (See Comments)    Increased heart rate, anxiety  . Demerol [Meperidine] Other (See Comments)    Increased heart rate, anxiety  . Epinephrine Other (See Comments)    Increased heart rate  . Erythromycin Nausea And Vomiting  . Hyoscyamine Sulfate Other (See Comments)    Pt could not tolerate this med UNKNOWN reaction  . Inderal [Propranolol] Other (See Comments)    Extreme tiredness  . Lidocaine Other (See Comments)    Family hx of malignant hyperthermia  . Lipitor [Atorvastatin] Other (See Comments)    Muscle aches  . Losartan Potassium Other (See Comments)    Pt could not tolerate this med UNKNOWN REACTION  . Metoprolol Swelling  . Morphine Other (See Comments)    Nightmares, increased BP  . Nebivolol Other (See Comments)    angioedema  . Oxycodone-Acetaminophen     Other reaction(s): Tachycardia / Palpitations  (intolerance) Causes accelerated heart rate  . Paxil [Paroxetine Hcl] Other (See Comments)    "drove her up the wall"  . Penicillins Nausea And Vomiting  . Prednisone Other (See Comments)    Tolerates low doses if absolutely necessary - makes her nervous, jittery  . Propranolol Hcl Other (See Comments)    Pt could not tolerate this med UNKNOWN REACTION  . Sertraline Hcl Other (See Comments)    Pt could not tolerate this med UNKNOWN REACTION  . Tetracycline Nausea And Vomiting  . Toprol Xl [Metoprolol Succinate] Other (See Comments)    Extreme tiredness  . Verapamil Other (See Comments)    Pt could not tolerate this med UNKNOWN REACTION  . Hydralazine     headaches  . Oxycodone-Acetaminophen Palpitations    Causes accelerated heart rate    Social History   Socioeconomic History  . Marital status: Married    Spouse name: Harrie Jeans  . Number of children: 1  .  Years of education: Not on file  . Highest education level: Not on file  Occupational History  . Occupation: Retired    Fish farm manager: UNEMPLOYED  Social Needs  . Financial resource strain: Not on file  . Food insecurity:    Worry: Not on file    Inability: Not on file  . Transportation needs:    Medical: Not on file    Non-medical: Not on file  Tobacco Use  . Smoking status: Never Smoker  . Smokeless tobacco: Never Used  Substance and Sexual Activity  . Alcohol use: No    Alcohol/week: 0.0  standard drinks  . Drug use: No  . Sexual activity: Not Currently    Birth control/protection: Surgical, Post-menopausal    Comment: HYST-1st intercourse 73 yo-fewer than 5 partners  Lifestyle  . Physical activity:    Days per week: Not on file    Minutes per session: Not on file  . Stress: Not on file  Relationships  . Social connections:    Talks on phone: Not on file    Gets together: Not on file    Attends religious service: Not on file    Active member of club or organization: Not on file    Attends meetings of clubs or organizations: Not on file    Relationship status: Not on file  . Intimate partner violence:    Fear of current or ex partner: Not on file    Emotionally abused: Not on file    Physically abused: Not on file    Forced sexual activity: Not on file  Other Topics Concern  . 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 Heart failure Sister   . Heart disease Sister   . Hypertension Sister   . Dementia Mother   . Hypertension Mother   . Hypertension Father   . Arthritis Father   . Breast cancer Maternal Aunt        Age 78's  . Diabetes Maternal Aunt   . Diabetes Maternal Uncle   . Ovarian cancer Maternal Aunt   . Colon cancer Neg Hx     Review of Systems:  As stated in the HPI and otherwise negative.    BP 138/88   Pulse 65   Ht 5\' 4"  (1.626 m)   Wt 129 lb 12.8 oz (58.9 kg)   SpO2 98%   BMI 22.28 kg/m   Physical Examination:  General: Well developed, well nourished, NAD  HEENT: OP clear, mucus membranes moist  SKIN: warm, dry. No rashes. Neuro: No focal deficits  Musculoskeletal: Muscle strength 5/5 all ext  Psychiatric: Mood and affect normal  Neck: No JVD, no carotid bruits, no thyromegaly, no lymphadenopathy.  Lungs:Clear bilaterally, no wheezes, rhonci, crackles Cardiovascular: Regular rate and rhythm. No murmurs, gallops or rubs. Abdomen:Soft. Bowel sounds present. Non-tender.  Extremities: No lower extremity edema. Pulses are 2 + in the bilateral DP/PT.  Echo 05/18/16: Left ventricle: The cavity size was normal. Wall thickness was   normal. Systolic function was normal. The estimated ejection   fraction was in the range of 60% to 65%. Wall motion was normal;   there were no regional wall motion abnormalities. Doppler   parameters are consistent with abnormal left ventricular   relaxation (grade 1 diastolic dysfunction). The E/e&' ratio is   between 8-15, suggesting indeterminate LV filling pressure. - Mitral valve: Mildly thickened leaflets . There was trivial   regurgitation. - Left atrium: The atrium was normal in size. - Right ventricle: The cavity size was normal. Wall thickness was   normal. Pacer wire or catheter noted in right ventricle. Systolic   function was normal. - Right atrium: The atrium was normal in size. Pacer wire or   catheter noted in right atrium. - Tricuspid valve: There was trivial regurgitation. - Pulmonary arteries: PA peak pressure: 22 mm Hg (S). - Inferior vena cava: The vessel  was normal in size. The   respirophasic diameter changes were in the normal range (= 50%),   consistent with normal central venous pressure. - Pericardium, extracardiac: Small pericardial effusion. Features   were not consistent with tamponade  physiology.  Impressions:  - LVEF 60-65%, normal wall thickness, normal wall motion, diastolic   dysfunction, indeterminate LV filling pressure, pacer wires   noted, trivial TR, normal RVSP, normal IVC, small pericardial   effusion without tamponade physiology.  EKG:  EKG is not ordered today. The ekg ordered today demonstrates   Recent Labs: 03/31/2018: ALT 9; BUN 15; Creatinine, Ser 0.83; Hemoglobin 13.9; Platelets 200.0; Potassium 4.5; Sodium 136; TSH 2.25   Lipid Panel    Component Value Date/Time   CHOL 235 (H) 05/29/2018 1049   TRIG 101.0 05/29/2018 1049   HDL 66.40 05/29/2018 1049   CHOLHDL 4 05/29/2018 1049   VLDL 20.2 05/29/2018 1049   LDLCALC 148 (H) 05/29/2018 1049   LDLDIRECT 146.6 01/25/2014 0953     Wt Readings from Last 3 Encounters:  12/08/18 129 lb 12.8 oz (58.9 kg)  11/20/18 128 lb (58.1 kg)  06/10/18 127 lb 4 oz (57.7 kg)     Other studies Reviewed: Additional studies/ records that were reviewed today include: . Review of the above records demonstrates:    Assessment and Plan:   1. HTN: BP is well controlled. No changes.     2. HLD: Followed in primary care. She is intolerant of statins.    3. Symptomatic bradycardia: Her pacemaker is followed by Dr. Rayann Heman  4. Palpitations/PVCs/PACs: Rare palpitations. Will continue Cardizem.   Current medicines are reviewed at length with the patient today.  The patient does not have concerns regarding medicines.  The following changes have been made:  no change  Labs/ tests ordered today include:   No orders of the defined types were placed in this encounter.    Disposition:   FU with me in 12  months   Signed, Lauree Chandler, MD 12/08/2018 4:03 PM    [facility] [address], [place], [place]  [address] Phone: [phone]; Fax: [phone]

## 2018-12-08 NOTE — Patient Instructions (Signed)

## 2018-12-11 ENCOUNTER — Ambulatory Visit: Payer: Medicare Other | Admitting: Gastroenterology

## 2018-12-12 NOTE — Telephone Encounter (Signed)
Spoke with pt informed her that the"AF"on her report could stand for atrial fib or atrial flutter reassured pt that the duration of the episode was so short its of no consequence and would keep monitoring her Pt voiced understanding

## 2018-12-29 ENCOUNTER — Other Ambulatory Visit: Payer: Self-pay | Admitting: Family Medicine

## 2018-12-30 ENCOUNTER — Ambulatory Visit (INDEPENDENT_AMBULATORY_CARE_PROVIDER_SITE_OTHER): Payer: Medicare Other | Admitting: Gastroenterology

## 2018-12-30 ENCOUNTER — Encounter: Payer: Self-pay | Admitting: Gastroenterology

## 2018-12-30 VITALS — BP 136/88 | HR 80 | Ht 64.0 in | Wt 128.0 lb

## 2018-12-30 DIAGNOSIS — R1013 Epigastric pain: Secondary | ICD-10-CM | POA: Diagnosis not present

## 2018-12-30 DIAGNOSIS — K219 Gastro-esophageal reflux disease without esophagitis: Secondary | ICD-10-CM

## 2018-12-30 DIAGNOSIS — K5904 Chronic idiopathic constipation: Secondary | ICD-10-CM

## 2018-12-30 MED ORDER — DICYCLOMINE HCL 10 MG PO CAPS: 10.0000 mg | ORAL_CAPSULE | Freq: Three times a day (TID) | ORAL | 11 refills | Status: DC | PRN

## 2018-12-30 NOTE — Patient Instructions (Signed)
Start Miralax mixing 17 grams in 8 oz of water daily as needed.   You can also take Gas-X three times a day as needed for gas and bloating.   Thank you for choosing me and [facility].  Pricilla Riffle. Dagoberto Ligas., MD., Marval Regal

## 2018-12-30 NOTE — Progress Notes (Signed)
    History of Present Illness: This is a 73 year old female who relates epigastric pain and chest pain intermittently.  She is accompanied by her husband.  She has frequent difficulties with constipation and bloating.  She has had problems with upper intestinal gas and bloating with symptoms radiating into her chest.  The symptoms are more active last month and they have been inactive for the past few weeks.  We prescribed dicyclomine previously but she has not tried dicyclomine.   Current Medications, Allergies, Past Medical History, Past Surgical History, Family History and Social History were reviewed in [employer] record.  Physical Exam: General: Well developed, well nourished, no acute distress Head: Normocephalic and atraumatic Eyes:  sclerae anicteric, EOMI Ears: Normal auditory acuity Mouth: No deformity or lesions Lungs: Clear throughout to auscultation Heart: Regular rate and rhythm; no murmurs, rubs or bruits Abdomen: Soft, non tender and non distended. No masses, hepatosplenomegaly or hernias noted. Normal Bowel sounds. Liver edge palpated with deep inspiration.  Rectal: Not done Musculoskeletal: Symmetrical with no gross deformities  Pulses:  Normal pulses noted Extremities: No clubbing, cyanosis, edema or deformities noted Neurological: Alert oriented x 4, grossly nonfocal Psychological:  Alert and cooperative. Normal mood and affect   Assessment and Recommendations:  1. Epigastric pain, gas, bloating resolved.  Gas-X 3 tid prn and dicyclomine 10 mg po tid prn if symptoms return.  Consider EGD if symptoms return and persist.  2. Constipation. Miralax daily as needed.  Patient is encouraged to use more frequently.   3. GERD. Continue Axid 150 mg po bid. Follow antintireflux measures.   4. Personal history of adenomatous colon polyps.  A 5-year interval surveillance colonoscopy is due in August 2023.

## 2019-02-02 ENCOUNTER — Other Ambulatory Visit: Payer: Self-pay | Admitting: Family Medicine

## 2019-02-02 DIAGNOSIS — Z1231 Encounter for screening mammogram for malignant neoplasm of breast: Secondary | ICD-10-CM

## 2019-02-02 MED ORDER — FAMOTIDINE 20 MG PO TABS: 20.0000 mg | ORAL_TABLET | Freq: Two times a day (BID) | ORAL | 1 refills | Status: DC

## 2019-02-04 ENCOUNTER — Ambulatory Visit (INDEPENDENT_AMBULATORY_CARE_PROVIDER_SITE_OTHER): Payer: Medicare Other | Admitting: *Deleted

## 2019-02-04 DIAGNOSIS — I495 Sick sinus syndrome: Secondary | ICD-10-CM | POA: Diagnosis not present

## 2019-02-04 LAB — CUP PACEART REMOTE DEVICE CHECK
Battery Impedance: 179 Ohm
Battery Remaining Longevity: 133 mo
Brady Statistic AP VP Percent: 0 %
Brady Statistic AS VS Percent: 22 %
Date Time Interrogation Session: 20200304133633
Implantable Lead Implant Date: 20060620
Implantable Lead Location: 753859
Implantable Pulse Generator Implant Date: 20160219
Lead Channel Impedance Value: 637 Ohm
Lead Channel Impedance Value: 730 Ohm
Lead Channel Pacing Threshold Amplitude: 0.75 V
Lead Channel Setting Pacing Amplitude: 2 V
Lead Channel Setting Pacing Amplitude: 2.5 V
Lead Channel Setting Sensing Sensitivity: 5.6 mV
MDC IDC LEAD IMPLANT DT: 20060620
MDC IDC LEAD LOCATION: 753860
MDC IDC MSMT BATTERY VOLTAGE: 2.78 V
MDC IDC MSMT LEADCHNL RA PACING THRESHOLD AMPLITUDE: 0.75 V
MDC IDC MSMT LEADCHNL RA PACING THRESHOLD PULSEWIDTH: 0.4 ms
MDC IDC MSMT LEADCHNL RV PACING THRESHOLD PULSEWIDTH: 0.4 ms
MDC IDC SET LEADCHNL RV PACING PULSEWIDTH: 0.4 ms
MDC IDC STAT BRADY AP VS PERCENT: 78 %
MDC IDC STAT BRADY AS VP PERCENT: 0 %

## 2019-02-12 NOTE — Progress Notes (Signed)
Remote pacemaker transmission.   

## 2019-02-24 ENCOUNTER — Other Ambulatory Visit: Payer: Self-pay | Admitting: Family Medicine

## 2019-02-25 NOTE — Telephone Encounter (Signed)
Sent. Thanks.   

## 2019-02-25 NOTE — Telephone Encounter (Signed)
Electronic refill request Lorazepam Last office visit 06/10/18 Last refill 11/12/18 #120/2

## 2019-03-19 ENCOUNTER — Other Ambulatory Visit: Payer: Self-pay | Admitting: Family Medicine

## 2019-03-19 MED ORDER — NIZATIDINE 150 MG PO CAPS: 150.0000 mg | ORAL_CAPSULE | Freq: Two times a day (BID) | ORAL | 0 refills | Status: DC

## 2019-03-19 NOTE — Telephone Encounter (Signed)
Spoke to pt. The cardiology office had deleted the med off her list by accident.

## 2019-03-19 NOTE — Telephone Encounter (Signed)
Caller Name: [name] Relationship to Patient: spouse Best Number: [phone] Pharmacy: [facility], [address], [facility]-   Reason for call: requesting refill nizatidine 150mg  **sent to new pharmacy, pt has a couple of days left

## 2019-03-23 ENCOUNTER — Ambulatory Visit: Payer: Medicare Other

## 2019-04-17 ENCOUNTER — Ambulatory Visit: Payer: Medicare Other

## 2019-04-24 ENCOUNTER — Encounter: Payer: Medicare Other | Admitting: Family Medicine

## 2019-04-28 ENCOUNTER — Other Ambulatory Visit: Payer: Self-pay | Admitting: Family Medicine

## 2019-04-28 DIAGNOSIS — I119 Hypertensive heart disease without heart failure: Secondary | ICD-10-CM

## 2019-04-29 ENCOUNTER — Other Ambulatory Visit: Payer: Self-pay

## 2019-04-29 ENCOUNTER — Ambulatory Visit
Admission: RE | Admit: 2019-04-29 | Discharge: 2019-04-29 | Disposition: A | Discharge status: Home / Self Care | Payer: Medicare Other | Source: Ambulatory Visit | Attending: Family Medicine | Admitting: Family Medicine

## 2019-04-29 DIAGNOSIS — Z1231 Encounter for screening mammogram for malignant neoplasm of breast: Secondary | ICD-10-CM

## 2019-05-06 ENCOUNTER — Ambulatory Visit (INDEPENDENT_AMBULATORY_CARE_PROVIDER_SITE_OTHER): Payer: Medicare Other | Admitting: *Deleted

## 2019-05-06 DIAGNOSIS — I495 Sick sinus syndrome: Secondary | ICD-10-CM | POA: Diagnosis not present

## 2019-05-06 LAB — CUP PACEART REMOTE DEVICE CHECK
Battery Impedance: 179 Ohm
Battery Remaining Longevity: 132 mo
Battery Voltage: 2.78 V
Brady Statistic AP VP Percent: 0 %
Brady Statistic AP VS Percent: 79 %
Brady Statistic AS VP Percent: 0 %
Brady Statistic AS VS Percent: 21 %
Date Time Interrogation Session: 20200603134008
Implantable Lead Implant Date: 20060620
Implantable Lead Implant Date: 20060620
Implantable Lead Location: 753859
Implantable Lead Location: 753860
Implantable Lead Model: 5076
Implantable Lead Model: 5076
Implantable Pulse Generator Implant Date: 20160219
Lead Channel Impedance Value: 646 Ohm
Lead Channel Impedance Value: 717 Ohm
Lead Channel Pacing Threshold Amplitude: 0.75 V
Lead Channel Pacing Threshold Amplitude: 0.75 V
Lead Channel Pacing Threshold Pulse Width: 0.4 ms
Lead Channel Pacing Threshold Pulse Width: 0.4 ms
Lead Channel Setting Pacing Amplitude: 2 V
Lead Channel Setting Pacing Amplitude: 2.5 V
Lead Channel Setting Pacing Pulse Width: 0.4 ms
Lead Channel Setting Sensing Sensitivity: 5.6 mV

## 2019-05-07 ENCOUNTER — Ambulatory Visit: Payer: Medicare Other

## 2019-05-07 ENCOUNTER — Other Ambulatory Visit: Payer: Self-pay | Admitting: Family Medicine

## 2019-05-07 NOTE — Telephone Encounter (Signed)
Electronic refill request. Lorazepam Last office visit:   06/10/2018   Wellness visit scheduled in July 2020 Last Filled:    120 tablet 1 02/25/2019  Please advise.

## 2019-05-08 NOTE — Telephone Encounter (Signed)
Sent. Thanks.   

## 2019-05-13 NOTE — Telephone Encounter (Signed)
Discussed with patient. She understands and has no further questions.

## 2019-05-14 ENCOUNTER — Encounter: Payer: Self-pay | Admitting: Cardiology

## 2019-05-14 NOTE — Progress Notes (Signed)
Remote pacemaker transmission.   

## 2019-05-21 ENCOUNTER — Encounter: Payer: Medicare Other | Admitting: Internal Medicine

## 2019-05-25 ENCOUNTER — Other Ambulatory Visit: Payer: Self-pay | Admitting: Cardiovascular Disease

## 2019-05-25 DIAGNOSIS — I119 Hypertensive heart disease without heart failure: Secondary | ICD-10-CM

## 2019-05-29 ENCOUNTER — Telehealth: Payer: Self-pay

## 2019-06-01 ENCOUNTER — Telehealth: Payer: Self-pay | Admitting: Internal Medicine

## 2019-06-01 NOTE — Telephone Encounter (Signed)
  Patient is calling in regards to her appt being made as a virtual visit. She does not want a virtual visit and would like to see when she can come into the office to be seen. There was a Pharmacist, community message sent. Please contact.

## 2019-06-02 ENCOUNTER — Other Ambulatory Visit: Payer: Self-pay | Admitting: Family Medicine

## 2019-06-03 ENCOUNTER — Telehealth: Payer: Medicare Other | Admitting: Internal Medicine

## 2019-06-16 ENCOUNTER — Other Ambulatory Visit: Payer: Self-pay | Admitting: Family Medicine

## 2019-06-16 NOTE — Telephone Encounter (Signed)
Electronic refill request. Lorazepam Last office visit:   06/10/2018 Last Filled:    120 tablet 0 05/08/2019  Please advise.

## 2019-06-17 NOTE — Telephone Encounter (Signed)
Harrie Jeans (spouse) called checking on rx Best number [phone]  [employer]

## 2019-06-17 NOTE — Telephone Encounter (Signed)
Sent. Thanks.   

## 2019-06-22 ENCOUNTER — Other Ambulatory Visit (INDEPENDENT_AMBULATORY_CARE_PROVIDER_SITE_OTHER): Payer: Medicare Other

## 2019-06-22 DIAGNOSIS — I119 Hypertensive heart disease without heart failure: Secondary | ICD-10-CM | POA: Diagnosis not present

## 2019-06-22 LAB — COMPREHENSIVE METABOLIC PANEL
ALT: 13 U/L (ref 0–35)
AST: 16 U/L (ref 0–37)
Albumin: 4.5 g/dL (ref 3.5–5.2)
Alkaline Phosphatase: 50 U/L (ref 39–117)
BUN: 20 mg/dL (ref 6–23)
CO2: 26 mEq/L (ref 19–32)
Calcium: 8.9 mg/dL (ref 8.4–10.5)
Chloride: 101 mEq/L (ref 96–112)
Creatinine, Ser: 0.96 mg/dL (ref 0.40–1.20)
GFR: 57.2 mL/min — ABNORMAL LOW (ref >60.00)
Glucose, Bld: 94 mg/dL (ref 70–99)
Potassium: 4.2 mEq/L (ref 3.5–5.1)
Sodium: 135 mEq/L (ref 135–145)
Total Bilirubin: 0.5 mg/dL (ref 0.2–1.2)
Total Protein: 7 g/dL (ref 6.0–8.3)

## 2019-06-22 LAB — LIPID PANEL
Cholesterol: 230 mg/dL — ABNORMAL HIGH (ref 0–200)
HDL: 67.2 mg/dL (ref >39.00)
LDL Cholesterol: 147 mg/dL — ABNORMAL HIGH (ref 0–99)
NonHDL: 162.78
Total CHOL/HDL Ratio: 3
Triglycerides: 78 mg/dL (ref 0.0–149.0)
VLDL: 15.6 mg/dL (ref 0.0–40.0)

## 2019-06-22 LAB — CBC WITH DIFFERENTIAL/PLATELET
Basophils Absolute: 0 10*3/uL (ref 0.0–0.1)
Basophils Relative: 0.8 % (ref 0.0–3.0)
Eosinophils Absolute: 0.1 10*3/uL (ref 0.0–0.7)
Eosinophils Relative: 1.7 % (ref 0.0–5.0)
HCT: 41.1 % (ref 36.0–46.0)
Hemoglobin: 14 g/dL (ref 12.0–15.0)
Lymphocytes Relative: 35 % (ref 12.0–46.0)
Lymphs Abs: 1.3 10*3/uL (ref 0.7–4.0)
MCHC: 34.2 g/dL (ref 30.0–36.0)
MCV: 95.7 fl (ref 78.0–100.0)
Monocytes Absolute: 0.3 10*3/uL (ref 0.1–1.0)
Monocytes Relative: 8.3 % (ref 3.0–12.0)
Neutro Abs: 2 10*3/uL (ref 1.4–7.7)
Neutrophils Relative %: 54.2 % (ref 43.0–77.0)
Platelets: 191 10*3/uL (ref 150.0–400.0)
RBC: 4.3 Mil/uL (ref 3.87–5.11)
RDW: 12.9 % (ref 11.5–15.5)
WBC: 3.7 10*3/uL — ABNORMAL LOW (ref 4.0–10.5)

## 2019-06-22 LAB — TSH: TSH: 1.67 u[IU]/mL (ref 0.35–4.50)

## 2019-06-29 ENCOUNTER — Ambulatory Visit (INDEPENDENT_AMBULATORY_CARE_PROVIDER_SITE_OTHER): Payer: Medicare Other | Admitting: Family Medicine

## 2019-06-29 ENCOUNTER — Other Ambulatory Visit: Payer: Self-pay

## 2019-06-29 ENCOUNTER — Encounter: Payer: Self-pay | Admitting: Family Medicine

## 2019-06-29 VITALS — BP 140/84 | HR 61 | Temp 98.3°F | Ht 64.0 in | Wt 128.2 lb

## 2019-06-29 DIAGNOSIS — Z0000 Encounter for general adult medical examination without abnormal findings: Secondary | ICD-10-CM

## 2019-06-29 DIAGNOSIS — K219 Gastro-esophageal reflux disease without esophagitis: Secondary | ICD-10-CM

## 2019-06-29 DIAGNOSIS — S065X9A Traumatic subdural hemorrhage with loss of consciousness of unspecified duration, initial encounter: Secondary | ICD-10-CM | POA: Diagnosis not present

## 2019-06-29 DIAGNOSIS — S065XAA Traumatic subdural hemorrhage with loss of consciousness status unknown, initial encounter: Secondary | ICD-10-CM

## 2019-06-29 DIAGNOSIS — F419 Anxiety disorder, unspecified: Secondary | ICD-10-CM

## 2019-06-29 DIAGNOSIS — Z7189 Other specified counseling: Secondary | ICD-10-CM

## 2019-06-29 NOTE — Patient Instructions (Addendum)
Check with your insurance to see if they will cover the shingrix shot. If you can't get axid, then try taking omeprazole 20mg  a day.  You can get that over the counter.  Let me know if you need a refill.   I would get a flu shot each fall.   Take care.  Glad to see you.

## 2019-06-29 NOTE — Progress Notes (Addendum)
I have personally reviewed the Medicare Annual Wellness questionnaire and have noted 1. The patient's medical and social history 2. Their use of alcohol, tobacco or illicit drugs 3. Their current medications and supplements 4. The patient's functional ability including ADL's, fall risks, home safety risks and hearing or visual             impairment. 5. Diet and physical activities 6. Evidence for depression or mood disorders  The patients weight, height, BMI have been recorded in the chart and visual acuity is per eye clinic.  I have made referrals, counseling and provided education to the patient based review of the above and I have provided the pt with a written personalized care plan for preventive services.  Provider list updated- see scanned forms.  Routine anticipatory guidance given to patient.  See health maintenance. The possibility exists that previously documented standard health maintenance information may have been brought forward from a previous encounter into this note.  If needed, that same information has been updated to reflect the current situation based on today's encounter.    Flu yearly Shingles discussed with patient about coverage PNA up-to-date Tetanus up-to-date, 2019 Colonoscopy 2018 Breast cancer screening 2020 Bone density test discussed with patient.  She will consider.  She may be a candidate for IV bisphosphonate treatment if her DEXA were positive.  She will update me if she wants to go for bone density screening. Advance directive-husband designated if patient were incapacitated. Cognitive function addressed- see scanned forms- and if abnormal then additional documentation follows.   Anxiety.  No ADE on BZD with failure to tolerate other meds.  D/w pt.  BZD helped with control of sx.  Insomnia improved with med use.  No SI/HI.  D/w pt about considering counseling.    D/w pt about PPI substitution due to axid shortage. axid controlled her sx when  used/available.  No ADE on med.   Subdural history noted.  No acute changes.  She has not needed further neurosurgery evaluation or treatment.  She does not have specific follow-up at this point.  We talked about trying to stay active and maintain her cognitive function as much as possible.  Her memory is normal on brief testing today.  No new symptoms.  PMH and SH reviewed  Meds, vitals, and allergies reviewed.   ROS: Per HPI.  Unless specifically indicated otherwise in HPI, the patient denies:  General: fever. Eyes: acute vision changes ENT: sore throat Cardiovascular: chest pain Respiratory: SOB GI: vomiting GU: dysuria Musculoskeletal: acute back pain Derm: acute rash Neuro: acute motor dysfunction Psych: worsening mood Endocrine: polydipsia Heme: bleeding Allergy: hayfever  GEN: nad, alert and oriented HEENT: Normocephalic/atraumatic. NECK: supple w/o LA CV: rrr. PULM: ctab, no inc wob ABD: soft, +bs EXT: no edema SKIN: no acute rash

## 2019-07-01 NOTE — Assessment & Plan Note (Signed)
Subdural history noted.  No acute changes.  She has not needed further neurosurgery evaluation or treatment.  She does not have specific follow-up at this point.  We talked about trying to stay active and maintain her cognitive function as much as possible.  Her memory is normal on brief testing today.

## 2019-07-01 NOTE — Assessment & Plan Note (Signed)
Flu yearly Shingles discussed with patient about coverage PNA up-to-date Tetanus up-to-date, 2019 Colonoscopy 2018 Breast cancer screening 2020 Bone density test discussed with patient.  She will consider.  She may be a candidate for IV bisphosphonate treatment if her DEXA were positive.  She will update me if she wants to go for bone density screening. Advance directive-husband designated if patient were incapacitated. Cognitive function addressed- see scanned forms- and if abnormal then additional documentation follows.

## 2019-07-01 NOTE — Assessment & Plan Note (Signed)
Advance directive- husband designated if patient were incapacitated.  

## 2019-07-01 NOTE — Assessment & Plan Note (Signed)
No ADE on BZD with failure to tolerate other meds.  D/w pt.  BZD helped with control of sx.  Insomnia improved with med use.  No SI/HI.  D/w pt about considering counseling.   Continue current medication for now, update me as needed.

## 2019-07-01 NOTE — Assessment & Plan Note (Signed)
Discussed potential treatment for osteoporosis and potential exacerbation of GERD with oral medications.  See above.  She may be a candidate for IV bisphosphonate treatment if she does decide to go through with that/if needed.  D/w pt about PPI substitution due to axid shortage. axid controlled her sx when used/available.  No ADE on med.  She can try famotidine or PPI if needed.  Update me as needed.

## 2019-07-15 ENCOUNTER — Ambulatory Visit (INDEPENDENT_AMBULATORY_CARE_PROVIDER_SITE_OTHER): Payer: Medicare Other | Admitting: Internal Medicine

## 2019-07-15 ENCOUNTER — Encounter: Payer: Self-pay | Admitting: Internal Medicine

## 2019-07-15 ENCOUNTER — Other Ambulatory Visit: Payer: Self-pay

## 2019-07-15 VITALS — BP 138/94 | HR 83 | Ht 64.0 in | Wt 126.0 lb

## 2019-07-15 DIAGNOSIS — I10 Essential (primary) hypertension: Secondary | ICD-10-CM | POA: Diagnosis not present

## 2019-07-15 DIAGNOSIS — I491 Atrial premature depolarization: Secondary | ICD-10-CM

## 2019-07-15 DIAGNOSIS — I495 Sick sinus syndrome: Secondary | ICD-10-CM

## 2019-07-15 LAB — CUP PACEART INCLINIC DEVICE CHECK
Battery Impedance: 203 Ohm
Battery Remaining Longevity: 129 mo
Battery Voltage: 2.78 V
Brady Statistic AP VP Percent: 0 %
Brady Statistic AP VS Percent: 78 %
Brady Statistic AS VP Percent: 0 %
Brady Statistic AS VS Percent: 22 %
Date Time Interrogation Session: 20200812132728
Implantable Lead Implant Date: 20060620
Implantable Lead Implant Date: 20060620
Implantable Lead Location: 753859
Implantable Lead Location: 753860
Implantable Lead Model: 5076
Implantable Lead Model: 5076
Implantable Pulse Generator Implant Date: 20160219
Lead Channel Impedance Value: 665 Ohm
Lead Channel Impedance Value: 758 Ohm
Lead Channel Pacing Threshold Amplitude: 0.5 V
Lead Channel Pacing Threshold Amplitude: 0.75 V
Lead Channel Pacing Threshold Pulse Width: 0.4 ms
Lead Channel Pacing Threshold Pulse Width: 0.4 ms
Lead Channel Sensing Intrinsic Amplitude: 2 mV
Lead Channel Sensing Intrinsic Amplitude: 22.4 mV
Lead Channel Setting Pacing Amplitude: 2 V
Lead Channel Setting Pacing Amplitude: 2.5 V
Lead Channel Setting Pacing Pulse Width: 0.4 ms
Lead Channel Setting Sensing Sensitivity: 5.6 mV

## 2019-07-15 NOTE — Progress Notes (Signed)
PCP: Tonia Ghent, MD Primary Cardiologist: Dr Angelena Form Primary EP:  Dr Rayann Heman  [name] is a 73 y.o. female who presents today for routine electrophysiology followup.  Since last being seen in our clinic, the patient reports doing very well.  Today, she denies symptoms of palpitations, chest pain, shortness of breath,  lower extremity edema, dizziness, presyncope, or syncope.  The patient is otherwise without complaint today.   Past Medical History:  Diagnosis Date  . Adenomatous colon polyp 12/2006  . Allergic rhinitis   . Anemia   . Arthritis   . Bradycardia    s/p MDT PPM  . Depression   . Esophageal stricture   . GERD (gastroesophageal reflux disease)   . Hemorrhoids   . Hyperlipidemia   . Hypertension   . Inguinal hernia   . Osteopenia 12/2012   T score -1.7 FRAX 8.5%/1.1%  . PVC (premature ventricular contraction)   . Scleritis of right eye   . Skin cancer    Basal and squamous cell  . Sleep apnea    a. intolerant of CPAP  . Subdural hematoma ([place])    a. spontanous 09/2017 b. resolved without intervention  . Thyroid disease   . Vitreous detachment of both eyes    Past Surgical History:  Procedure Laterality Date  . ABDOMINAL HYSTERECTOMY    . BREAST SURGERY     Biopsy-benign  . CESAREAN SECTION    . DILATION AND CURETTAGE OF UTERUS    . ESOPHAGOGASTRODUODENOSCOPY     esophageal stretching  . HAND SURGERY  1992  . HERNIA REPAIR    . INGUINAL HERNIA REPAIR  02/12/1994   left inguinal hernia repair  . KNEE ARTHROSCOPY  2011  . OOPHORECTOMY  1995   LSO and RSO  . PACEMAKER GENERATOR CHANGE N/A 01/21/2015   Procedure: PACEMAKER GENERATOR CHANGE;  Surgeon: [name], MD;  Location: [facility] CATH LAB;  Service: Cardiovascular;  Laterality: N/A;  . PACEMAKER INSERTION  2006   Dr. Verlon Setting  . POLYPECTOMY    . Tear duct surgery  2010  . THUMB SURGERY    . THYROIDECTOMY  1979   RIGHT SIDE  . TOTAL KNEE ARTHROPLASTY Right 12/20/2014   Procedure: RIGHT  TOTAL KNEE ARTHROPLASTY;  Surgeon: Vickey Huger, MD;  Location: [place];  Service: Orthopedics;  Laterality: Right;    ROS- all systems are reviewed and negative except as per HPI above  Current Outpatient Medications  Medication Sig Dispense Refill  . acetaminophen (TYLENOL) 500 MG tablet Take 1,000 mg by mouth every 6 (six) hours as needed (pain).    . Carboxymethylcell-Hypromellose (GENTEAL) 0.25-0.3 % GEL Place 1 application into both eyes at bedtime.    Marland Kitchen CARTIA XT 120 MG 24 hr capsule Take 1 capsule by mouth once daily 90 capsule 0  . LORazepam (ATIVAN) 1 MG tablet TAKE 1 TABLET BY MOUTH EVERY 6 HOURS AS NEEDED 120 tablet 1  . nitroGLYCERIN (NITROSTAT) 0.4 MG SL tablet Place 1 tablet (0.4 mg total) under the tongue every 5 (five) minutes as needed. For up to 3 doses 25 tablet 6  . nizatidine (AXID) 150 MG capsule TAKE 1 CAPSULE TWICE DAILY 30 capsule 4  . Polyethyl Glycol-Propyl Glycol (SYSTANE OP) Place 1 drop into both eyes daily as needed (dry eyes).    Marland Kitchen terconazole (TERAZOL 7) 0.4 % vaginal cream Place 1 applicator vaginally at bedtime. As needed for irritation 45 g 2   No current facility-administered medications for this  visit.     Physical Exam: Vitals:   07/15/19 1219  BP: (!) 138/94  Pulse: 83  SpO2: 98%  Weight: 126 lb (57.2 kg)  Height: 5\' 4"  (1.626 m)    GEN- The patient is well appearing, alert and oriented x 3 today.   Head- normocephalic, atraumatic Eyes-  Sclera clear, conjunctiva pink Ears- hearing intact Oropharynx- clear Lungs- Clear to ausculation bilaterally, normal work of breathing Chest- pacemaker pocket is well healed Heart- Regular rate and rhythm, no murmurs, rubs or gallops, PMI not laterally displaced GI- soft, NT, ND, + BS Extremities- no clubbing, cyanosis, or edema  Pacemaker interrogation- reviewed in detail today,  See PACEART report   Assessment and Plan:  1. Symptomatic sinus bradycardia  Normal pacemaker function See Pace Art  report No changes today she is not device dependant today  2. HTN Stable No change required today  3. Nonsustained atrial tachycardia Asymptomatic  4. Prior spontaneous subdural hematoma Not every a candidate for anticoagulation per patient  carelink Return to see EP NP in a year  [name] MD, [facility] 07/15/2019 12:41 PM

## 2019-07-15 NOTE — Patient Instructions (Addendum)
Medication Instructions:  Your physician recommends that you continue on your current medications as directed. Please refer to the Current Medication list given to you today.  *If you need a refill on your cardiac medications before your next appointment, please call your pharmacy*  Labwork: None ordered  Testing/Procedures: None ordered  Follow-Up: Remote monitoring is used to monitor your Pacemaker or ICD from home. This monitoring reduces the number of office visits required to check your device to one time per year. It allows [place] to keep an eye on the functioning of your device to ensure it is working properly. You are scheduled for a device check from home on 08/05/19. You may send your transmission at any time that day. If you have a wireless device, the transmission will be sent automatically. After your physician reviews your transmission, you will receive a postcard with your next transmission date.  Your physician wants you to follow-up in: 1 year with Chanetta Marshall, NP.  You will receive a reminder letter in the mail two months in advance. If you don't receive a letter, please call our office to schedule the follow-up appointment.  Thank you for choosing [facility]!!

## 2019-07-28 ENCOUNTER — Telehealth: Payer: Self-pay | Admitting: Gastroenterology

## 2019-07-28 NOTE — Telephone Encounter (Signed)
Pt stated that she has been taking nizatidine (prescribed by PCP) for years, but lately the drug has been on backorder.  Pt would like to know whether Dr. Fuller Plan can recommend another drug for GERD.

## 2019-07-28 NOTE — Telephone Encounter (Signed)
Patient states she has been taking Axid for years and in the last couple of months cannot find it at any pharmacy. Patient would like to know another option for a reflux medication. Informed patient she can try OTC Pepcid AC or she can discuss with her PCP who prescribed  Axid about getting a prescription strength reflux medicine. Patient states she will try taking Pepcid AC for a couple of weeks.

## 2019-08-05 ENCOUNTER — Ambulatory Visit (INDEPENDENT_AMBULATORY_CARE_PROVIDER_SITE_OTHER): Payer: Medicare Other | Admitting: *Deleted

## 2019-08-05 DIAGNOSIS — I495 Sick sinus syndrome: Secondary | ICD-10-CM

## 2019-08-06 LAB — CUP PACEART REMOTE DEVICE CHECK
Battery Impedance: 203 Ohm
Battery Remaining Longevity: 128 mo
Battery Voltage: 2.78 V
Brady Statistic AP VP Percent: 0 %
Brady Statistic AP VS Percent: 79 %
Brady Statistic AS VP Percent: 0 %
Brady Statistic AS VS Percent: 21 %
Date Time Interrogation Session: 20200902131617
Implantable Lead Implant Date: 20060620
Implantable Lead Implant Date: 20060620
Implantable Lead Location: 753859
Implantable Lead Location: 753860
Implantable Lead Model: 5076
Implantable Lead Model: 5076
Implantable Pulse Generator Implant Date: 20160219
Lead Channel Impedance Value: 661 Ohm
Lead Channel Impedance Value: 744 Ohm
Lead Channel Pacing Threshold Amplitude: 0.75 V
Lead Channel Pacing Threshold Amplitude: 0.75 V
Lead Channel Pacing Threshold Pulse Width: 0.4 ms
Lead Channel Pacing Threshold Pulse Width: 0.4 ms
Lead Channel Setting Pacing Amplitude: 2 V
Lead Channel Setting Pacing Amplitude: 2.5 V
Lead Channel Setting Pacing Pulse Width: 0.4 ms
Lead Channel Setting Sensing Sensitivity: 5.6 mV

## 2019-08-21 NOTE — Progress Notes (Signed)
Remote pacemaker transmission.   

## 2019-08-25 ENCOUNTER — Ambulatory Visit: Payer: Medicare Other

## 2019-08-28 ENCOUNTER — Other Ambulatory Visit: Payer: Self-pay | Admitting: Cardiovascular Disease

## 2019-08-28 DIAGNOSIS — I119 Hypertensive heart disease without heart failure: Secondary | ICD-10-CM

## 2019-09-01 ENCOUNTER — Telehealth: Payer: Self-pay | Admitting: Family Medicine

## 2019-09-01 NOTE — Telephone Encounter (Signed)
Patient stated that they are having a hard time getting the medication so the [facility] is the one requesting this so they can send it to the patient

## 2019-09-02 MED ORDER — NIZATIDINE 150 MG PO CAPS: 150.0000 mg | ORAL_CAPSULE | Freq: Two times a day (BID) | ORAL | 5 refills | Status: DC

## 2019-09-02 NOTE — Telephone Encounter (Signed)
[facility] called today and stated that only 18 tablets where sent for the patient for this presvcription   They are hoping our office could send over a prescription to them for a month supply     [facility] PHONE- [phone]

## 2019-09-02 NOTE — Telephone Encounter (Signed)
Please see note, any reason that only 18 was sent?

## 2019-09-02 NOTE — Telephone Encounter (Signed)
I do not know why that was sent in for 18 pills.  I think that was done automatically given that it was a chronic medication that is not a controlled prescription.  I resent the prescription.  I apologize for the confusion.

## 2019-09-02 NOTE — Addendum Note (Signed)
Addended by: Tonia Ghent on: 09/02/2019 01:25 PM   Modules accepted: Orders

## 2019-09-03 ENCOUNTER — Ambulatory Visit (INDEPENDENT_AMBULATORY_CARE_PROVIDER_SITE_OTHER): Payer: Medicare Other

## 2019-09-03 DIAGNOSIS — Z23 Encounter for immunization: Secondary | ICD-10-CM

## 2019-09-09 ENCOUNTER — Encounter: Payer: Self-pay | Admitting: Gynecology

## 2019-09-14 ENCOUNTER — Other Ambulatory Visit: Payer: Self-pay | Admitting: Family Medicine

## 2019-09-15 NOTE — Telephone Encounter (Signed)
Electronic refill request. Lorazepam Last office visit:   06/2019 Last Filled:    120 tablet 1 refill 06/17/2019  Please advise.

## 2019-09-16 ENCOUNTER — Other Ambulatory Visit: Payer: Self-pay

## 2019-09-16 NOTE — Telephone Encounter (Signed)
Sent. Thanks.   

## 2019-10-26 ENCOUNTER — Encounter: Payer: Self-pay | Admitting: Family Medicine

## 2019-10-27 ENCOUNTER — Other Ambulatory Visit: Payer: Self-pay | Admitting: Family Medicine

## 2019-10-27 MED ORDER — FAMOTIDINE 20 MG PO TABS: 20.0000 mg | ORAL_TABLET | Freq: Two times a day (BID) | ORAL | 1 refills | Status: DC

## 2019-11-04 ENCOUNTER — Ambulatory Visit (INDEPENDENT_AMBULATORY_CARE_PROVIDER_SITE_OTHER): Payer: Medicare Other | Admitting: *Deleted

## 2019-11-04 DIAGNOSIS — I495 Sick sinus syndrome: Secondary | ICD-10-CM

## 2019-11-04 LAB — CUP PACEART REMOTE DEVICE CHECK
Battery Impedance: 227 Ohm
Battery Remaining Longevity: 126 mo
Battery Voltage: 2.78 V
Brady Statistic AP VP Percent: 0 %
Brady Statistic AP VS Percent: 76 %
Brady Statistic AS VP Percent: 0 %
Brady Statistic AS VS Percent: 24 %
Date Time Interrogation Session: 20201202083312
Implantable Lead Implant Date: 20060620
Implantable Lead Implant Date: 20060620
Implantable Lead Location: 753859
Implantable Lead Location: 753860
Implantable Lead Model: 5076
Implantable Lead Model: 5076
Implantable Pulse Generator Implant Date: 20160219
Lead Channel Impedance Value: 680 Ohm
Lead Channel Impedance Value: 819 Ohm
Lead Channel Pacing Threshold Amplitude: 0.625 V
Lead Channel Pacing Threshold Amplitude: 0.625 V
Lead Channel Pacing Threshold Pulse Width: 0.4 ms
Lead Channel Pacing Threshold Pulse Width: 0.4 ms
Lead Channel Setting Pacing Amplitude: 2 V
Lead Channel Setting Pacing Amplitude: 2.5 V
Lead Channel Setting Pacing Pulse Width: 0.4 ms
Lead Channel Setting Sensing Sensitivity: 5.6 mV

## 2019-11-09 NOTE — Telephone Encounter (Signed)
Attempted to reach patient to discuss symptoms. No answer, unable to LM.

## 2019-11-11 NOTE — Telephone Encounter (Signed)
Patient called back. Advised transmission results were stable and she has not had any atrial arrhythmias recorded. Pt reports that she has been noticing irregular heart beats for years, but it has been worse recently. She reports increased stress over the past few months due to COVID precautions and having to stay home. No chest pain, dizziness, or SOB, symptoms are sensation of skipped beats as well as short runs of fast beats. Sometimes makes her feel like she needs to take a deep breath. Last 5-10sec at a time, come in "spells." Often able to ignore but episodes have been increasing in frequency. History of nonsustained atrial tachycardia. Pt reports compliance with diltiazem.   Advised message will be routed to Dr. Rayann Heman and Sonia Baller, RN, for recommendations. Pt in agreement with plan. No further questions at this time.

## 2019-11-23 ENCOUNTER — Other Ambulatory Visit: Payer: Self-pay

## 2019-11-24 ENCOUNTER — Ambulatory Visit (INDEPENDENT_AMBULATORY_CARE_PROVIDER_SITE_OTHER): Payer: Medicare Other | Admitting: Gynecology

## 2019-11-24 ENCOUNTER — Encounter: Payer: Self-pay | Admitting: Gynecology

## 2019-11-24 VITALS — BP 134/82 | Ht 65.0 in | Wt 130.0 lb

## 2019-11-24 DIAGNOSIS — M8580 Other specified disorders of bone density and structure, unspecified site: Secondary | ICD-10-CM

## 2019-11-24 DIAGNOSIS — Z01419 Encounter for gynecological examination (general) (routine) without abnormal findings: Secondary | ICD-10-CM

## 2019-11-24 DIAGNOSIS — N952 Postmenopausal atrophic vaginitis: Secondary | ICD-10-CM | POA: Diagnosis not present

## 2019-11-24 MED ORDER — TERCONAZOLE 0.4 % VA CREA: 1.0000 | TOPICAL_CREAM | Freq: Every day | VAGINAL | 4 refills | Status: DC

## 2019-11-24 NOTE — Patient Instructions (Signed)
Follow-up for a bone density as ordered.  Follow-up in 1 year for annual exam

## 2019-11-24 NOTE — Progress Notes (Signed)
    [name] 22-Sep-1946 [id number]        73 y.o.  G2P1011 for annual gynecologic exam.  Continues to have intermittent vaginal dryness and irritation for which she uses Terazol cream.  Past medical history,surgical history, problem list, medications, allergies, family history and social history were all reviewed and documented as reviewed in the EPIC chart.  ROS:  Performed with pertinent positives and negatives included in the history, assessment and plan.   Additional significant findings : None   Exam: Caryn Bee assistant Vitals:   11/24/19 1356  BP: 134/82  Weight: 130 lb (59 kg)  Height: 5\' 5"  (1.651 m)   Body mass index is 21.63 kg/m.  General appearance:  Normal affect, orientation and appearance. Skin: Grossly normal HEENT: Without gross lesions.  No cervical or supraclavicular adenopathy. Thyroid normal.  Lungs:  Clear without wheezing, rales or rhonchi Cardiac: RR, without RMG Abdominal:  Soft, nontender, without masses, guarding, rebound, organomegaly or hernia Breasts:  Examined lying and sitting without masses, retractions, discharge or axillary adenopathy. Pelvic:  Ext, BUS, Vagina: With atrophic changes  Adnexa: Without masses or tenderness    Anus and perineum: Normal   Rectovaginal: Normal sphincter tone without palpated masses or tenderness.    Assessment/Plan:  73 y.o. G74P1011 female for annual gynecologic exam.  Status post hysterectomy BSO in the past  1. Postmenopausal/atrophic genital changes.  Has intermittent irritation and dryness.  Has used Terazol cream intermittently with good results.  We have discussed this off brand labeling use and alternatives to include OTC moisturizers and vaginal estrogen supplementation.  The patient feels that the Lonn [place] works well for her and uses it sporadically with good results.  Terazol 7 day with 4 refills provided. 2. Osteopenia.  DEXA 2014 T score -1.7 FRAX 8.5% / 1.1%.  Has declined DEXA testing in the  past.  I again strongly recommended she schedule a bone density as has her primary provider.  The patient is reluctant at this point.  Risks of fracture reviewed.  Order placed and patient will decide if she wants to pursue this. 3. Colonoscopy 2018.  Repeat at their recommended interval. 4. Mammography 04/2019.  Continue with annual mammography when due.  Breast exam normal today. 5. Pap smear 2012.  No Pap smear done today.  No history of significant abnormal Pap smears.  We both agree to stop screening per current screening guidelines. 6. Health maintenance.  No routine lab work done as patient does this elsewhere.  Follow-up in 1 year, sooner as needed.   Anastasio Auerbach MD, 2:23 PM 11/24/2019

## 2019-11-30 NOTE — Progress Notes (Signed)
PPM Remote  

## 2019-12-05 ENCOUNTER — Other Ambulatory Visit: Payer: Self-pay | Admitting: Family Medicine

## 2019-12-07 DIAGNOSIS — H34811 Central retinal vein occlusion, right eye, with macular edema: Secondary | ICD-10-CM | POA: Diagnosis not present

## 2019-12-07 NOTE — Telephone Encounter (Signed)
Electronic refill request. Lorazepam Last office visit:   06/29/2019 Last Filled:    120 tablet 1 09/16/2019   Please advise.

## 2019-12-08 NOTE — Telephone Encounter (Signed)
Sent. Thanks.   

## 2019-12-16 ENCOUNTER — Encounter: Payer: Self-pay | Admitting: Cardiovascular Disease

## 2019-12-16 ENCOUNTER — Other Ambulatory Visit: Payer: Self-pay

## 2019-12-16 ENCOUNTER — Ambulatory Visit (INDEPENDENT_AMBULATORY_CARE_PROVIDER_SITE_OTHER): Payer: Medicare Other | Admitting: Cardiovascular Disease

## 2019-12-16 VITALS — BP 132/84 | HR 67 | Ht 64.5 in | Wt 130.4 lb

## 2019-12-16 DIAGNOSIS — I10 Essential (primary) hypertension: Secondary | ICD-10-CM | POA: Diagnosis not present

## 2019-12-16 DIAGNOSIS — I491 Atrial premature depolarization: Secondary | ICD-10-CM

## 2019-12-16 DIAGNOSIS — I495 Sick sinus syndrome: Secondary | ICD-10-CM | POA: Diagnosis not present

## 2019-12-16 DIAGNOSIS — E7800 Pure hypercholesterolemia, unspecified: Secondary | ICD-10-CM | POA: Diagnosis not present

## 2019-12-16 NOTE — Progress Notes (Signed)
Chief Complaint  Patient presents with  . Follow-up    sick sinus syndrome   History of Present Illness: 74 yo female with history of sleep apnea, HLD, HTN, GERD/hiatal hernia/esopagheal stricture, sick sinus syndrome s/p pacemaker, PACs, PVCs who is here today for cardiac follow up. She had been followed by Dr. Mare Ferrari for general cardiology issues. Her pacemaker is followed by Dr. Rayann Heman. The pacemaker was changed in 2016 due to end of battery life. Echo June 2017 with normal LV function and small pericardial effusion with no tamponade. She is known to have PACs and PVCs.  She had a spontaneous subdural hematoma in October 2018 that did not require surgical intervention.   She is here today for follow up. The patient denies any chest pain, dyspnea, palpitations, lower extremity edema, orthopnea, PND, dizziness, near syncope or syncope.   Primary Care Physician: Tonia Ghent, MD  Past Medical History:  Diagnosis Date  . Adenomatous colon polyp 12/2006  . Allergic rhinitis   . Anemia   . Arthritis   . Bradycardia    s/p MDT PPM  . Depression   . Esophageal stricture   . GERD (gastroesophageal reflux disease)   . Hemorrhoids   . Hyperlipidemia   . Hypertension   . Inguinal hernia   . Osteopenia 12/2012   T score -1.7 FRAX 8.5%/1.1%  . PVC (premature ventricular contraction)   . Scleritis of right eye   . Skin cancer    Basal and squamous cell  . Sleep apnea    a. intolerant of CPAP  . Subdural hematoma ([place])    a. spontanous 09/2017 b. resolved without intervention  . Thyroid disease   . Vitreous detachment of both eyes     Past Surgical History:  Procedure Laterality Date  . ABDOMINAL HYSTERECTOMY    . BREAST SURGERY     Biopsy-benign  . CESAREAN SECTION    . DILATION AND CURETTAGE OF UTERUS    . ESOPHAGOGASTRODUODENOSCOPY     esophageal stretching  . HAND SURGERY  1992  . HERNIA REPAIR    . INGUINAL HERNIA REPAIR  02/12/1994   left inguinal hernia repair   . KNEE ARTHROSCOPY  2011  . OOPHORECTOMY  1995   LSO and RSO  . PACEMAKER GENERATOR CHANGE N/A 01/21/2015   Procedure: PACEMAKER GENERATOR CHANGE;  Surgeon: [name], MD;  Location: [facility] CATH LAB;  Service: Cardiovascular;  Laterality: N/A;  . PACEMAKER INSERTION  2006   Dr. Verlon Setting  . POLYPECTOMY    . Tear duct surgery  2010  . THUMB SURGERY    . THYROIDECTOMY  1979   RIGHT SIDE  . TOTAL KNEE ARTHROPLASTY Right 12/20/2014   Procedure: RIGHT TOTAL KNEE ARTHROPLASTY;  Surgeon: Vickey Huger, MD;  Location: [place];  Service: Orthopedics;  Laterality: Right;    Current Outpatient Medications  Medication Sig Dispense Refill  . acetaminophen (TYLENOL) 500 MG tablet Take 1,000 mg by mouth every 6 (six) hours as needed (pain).    . Carboxymethylcell-Hypromellose (GENTEAL) 0.25-0.3 % GEL Place 1 application into both eyes at bedtime.    Marland Kitchen CARTIA XT 120 MG 24 hr capsule Take 1 capsule by mouth once daily 90 capsule 1  . famotidine (PEPCID) 20 MG tablet Take 1 tablet (20 mg total) by mouth 2 (two) times daily. 180 tablet 1  . LORazepam (ATIVAN) 1 MG tablet TAKE 1 TABLET BY MOUTH EVERY 6 HOURS AS NEEDED 120 tablet 1  . metroNIDAZOLE (METROGEL) 0.75 % gel  Apply 1 application topically as needed.    . nitroGLYCERIN (NITROSTAT) 0.4 MG SL tablet Place 1 tablet (0.4 mg total) under the tongue every 5 (five) minutes as needed. For up to 3 doses 25 tablet 6  . ofloxacin (FLOXIN) 0.3 % OTIC solution 1 drop See admin instructions. For 7 days after injection    . Polyethyl Glycol-Propyl Glycol (SYSTANE OP) Place 1 drop into both eyes daily as needed (dry eyes).    Marland Kitchen terconazole (TERAZOL 7) 0.4 % vaginal cream Place 1 applicator vaginally at bedtime. For 7 nights 45 g 4   No current facility-administered medications for this visit.    Allergies  Allergen Reactions  . Hydrocodone     Increased HR  . Antihistamines, Loratadine-Type Other (See Comments)    Heart races/jittery  .  Bisoprolol-Hydrochlorothiazide Other (See Comments)    Couldn't tolerate UNKNOWN REACTION  . Cephalexin Nausea And Vomiting  . Codeine Other (See Comments)    Increased heart rate, anxiety  . Demerol [Meperidine] Other (See Comments)    Increased heart rate, anxiety  . Epinephrine Other (See Comments)    Increased heart rate  . Erythromycin Nausea And Vomiting  . Hyoscyamine Sulfate Other (See Comments)    Pt could not tolerate this med UNKNOWN reaction  . Inderal [Propranolol] Other (See Comments)    Extreme tiredness  . Lidocaine Other (See Comments)    Family hx of malignant hyperthermia  . Lipitor [Atorvastatin] Other (See Comments)    Muscle aches  . Losartan Potassium Other (See Comments)    Pt could not tolerate this med UNKNOWN REACTION  . Metoprolol Swelling  . Morphine Other (See Comments)    Nightmares, increased BP  . Nebivolol Other (See Comments)    angioedema  . Paxil [Paroxetine Hcl] Other (See Comments)    "drove her up the wall"  . Penicillins Nausea And Vomiting  . Prednisone Other (See Comments)    Tolerates low doses if absolutely necessary - makes her nervous, jittery  . Propranolol Hcl Other (See Comments)    Pt could not tolerate this med UNKNOWN REACTION  . Sertraline Hcl Other (See Comments)    Pt could not tolerate this med UNKNOWN REACTION  . Tetracycline Nausea And Vomiting  . Toprol Xl [Metoprolol Succinate] Other (See Comments)    Extreme tiredness  . Verapamil Other (See Comments)    Pt could not tolerate this med UNKNOWN REACTION  . Hydralazine     headaches  . Oxycodone-Acetaminophen Palpitations    Causes accelerated heart rate    Social History   Socioeconomic History  . Marital status: Married    Spouse name: Harrie Jeans  . Number of children: 1  . Years of education: Not on file  . Highest education level: Not on file  Occupational History  . Occupation: Retired    Fish farm manager: UNEMPLOYED  Tobacco Use  . Smoking status: Never  Smoker  . Smokeless tobacco: Never Used  Substance and Sexual Activity  . Alcohol use: No    Alcohol/week: 0.0 standard drinks  . Drug use: No  . Sexual activity: Not Currently    Birth control/protection: Surgical, Post-menopausal    Comment: HYST-1st intercourse 74 yo-fewer than 5 partners  Other Topics Concern  . 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 Difficulty of Paying Living Expenses: Not on file  Food Insecurity:   . Worried About Charity fundraiser in the Last Year: Not on file  . Ran Out of Food in the Last Year: Not on file  Transportation Needs:   . Lack of Transportation (Medical): Not on file  . Lack of Transportation (Non-Medical): Not on file  Physical Activity:   . Days of Exercise per Week: Not on file  . Minutes of Exercise per Session: Not on file  Stress:   . Feeling of Stress : Not on file  Social Connections:   . Frequency of Communication with Friends and Family: Not on file  . Frequency of Social Gatherings with Friends and Family: Not on file  . Attends Religious Services: Not on file  . Active Member of Clubs or Organizations: Not on file  . Attends Archivist Meetings: Not on file  . Marital Status: Not on file  Intimate Partner Violence:   . Fear of Current or Ex-Partner: Not on file  . Emotionally Abused: Not on file  . Physically Abused: Not on file  . Sexually Abused: Not on file    Family History  Problem Relation Age of Onset  . Heart failure Sister   . Heart disease Sister   . Hypertension Sister   . Dementia Mother   . Hypertension Mother   . Hypertension Father   . Arthritis Father   . Breast cancer Maternal Aunt        Age 20's  . Diabetes Maternal Aunt   . Diabetes Maternal Uncle   . Ovarian cancer Maternal Aunt   . Colon cancer Neg Hx      Review of Systems:  As stated in the HPI and otherwise negative.   BP 132/84   Pulse 67   Ht 5' 4.5" (1.638 m)   Wt 130 lb 6.4 oz (59.1 kg)   SpO2 97%   BMI 22.04 kg/m   Physical Examination:  General: Well developed, well nourished, NAD  HEENT: OP clear, mucus membranes moist  SKIN: warm, dry. No rashes. Neuro: No focal deficits  Musculoskeletal: Muscle strength 5/5 all ext  Psychiatric: Mood and affect normal  Neck: No JVD, no carotid bruits, no thyromegaly, no lymphadenopathy.  Lungs:Clear bilaterally, no wheezes, rhonci, crackles Cardiovascular: Regular rate and rhythm. No murmurs, gallops or rubs. Abdomen:Soft. Bowel sounds present. Non-tender.  Extremities: No lower extremity edema. Pulses are 2 + in the bilateral DP/PT.  Echo 05/18/16: Left ventricle: The cavity size was normal. Wall thickness was   normal. Systolic function was normal. The estimated ejection   fraction was in the range of 60% to 65%. Wall motion was normal;   there were no regional wall motion abnormalities. Doppler   parameters are consistent with abnormal left ventricular   relaxation (grade 1 diastolic dysfunction). The E/e&' ratio is   between 8-15, suggesting indeterminate LV filling pressure. - Mitral valve: Mildly thickened leaflets . There was trivial   regurgitation. - Left atrium: The atrium was normal in size. - Right ventricle: The cavity size was normal. Wall thickness was   normal. Pacer wire or catheter noted in right ventricle. Systolic   function was normal. - Right atrium: The atrium was normal in size. Pacer wire or   catheter noted in right atrium. - Tricuspid valve: There was trivial regurgitation. - Pulmonary arteries: PA peak pressure: 22 mm Hg (S). - Inferior vena cava: The vessel was normal  in size. The   respirophasic diameter changes were in the normal range (= 50%),   consistent with normal central venous pressure. - Pericardium, extracardiac: Small pericardial  effusion. Features   were not consistent with tamponade physiology.  Impressions:  - LVEF 60-65%, normal wall thickness, normal wall motion, diastolic   dysfunction, indeterminate LV filling pressure, pacer wires   noted, trivial TR, normal RVSP, normal IVC, small pericardial   effusion without tamponade physiology.  EKG:  EKG is not ordered today. The ekg ordered today demonstrates   Recent Labs: 06/22/2019: ALT 13; BUN 20; Creatinine, Ser 0.96; Hemoglobin 14.0; Platelets 191.0; Potassium 4.2; Sodium 135; TSH 1.67   Lipid Panel    Component Value Date/Time   CHOL 230 (H) 06/22/2019 0940   TRIG 78.0 06/22/2019 0940   HDL 67.20 06/22/2019 0940   CHOLHDL 3 06/22/2019 0940   VLDL 15.6 06/22/2019 0940   LDLCALC 147 (H) 06/22/2019 0940   LDLDIRECT 146.6 01/25/2014 0953     Wt Readings from Last 3 Encounters:  12/16/19 130 lb 6.4 oz (59.1 kg)  11/24/19 130 lb (59 kg)  07/15/19 126 lb (57.2 kg)     Other studies Reviewed: Additional studies/ records that were reviewed today include: . Review of the above records demonstrates:    Assessment and Plan:   1. HTN: BP is controlled. No changes   2. HLD: Followed in primary care. Intolerant of statins  3. Symptomatic bradycardia: Her pacemaker is followed by Dr. Rayann Heman  4. Palpitations/PVCs/PACs: No palpitations. Continue Cardizem.   Current medicines are reviewed at length with the patient today.  The patient does not have concerns regarding medicines.  The following changes have been made:  no change  Labs/ tests ordered today include:   No orders of the defined types were placed in this encounter.    Disposition:   FU with me in 12  months   Signed, Lauree Chandler, MD 12/16/2019 4:37 PM    [facility] [place], [place], [place]  [address] Phone: [phone]; Fax: [phone]

## 2019-12-16 NOTE — Patient Instructions (Signed)

## 2019-12-18 ENCOUNTER — Encounter: Payer: Self-pay | Admitting: Family Medicine

## 2019-12-28 DIAGNOSIS — S83411A Sprain of medial collateral ligament of right knee, initial encounter: Secondary | ICD-10-CM | POA: Diagnosis not present

## 2019-12-28 DIAGNOSIS — Z96651 Presence of right artificial knee joint: Secondary | ICD-10-CM | POA: Diagnosis not present

## 2019-12-30 ENCOUNTER — Ambulatory Visit: Payer: Medicare Other

## 2020-01-04 DIAGNOSIS — H524 Presbyopia: Secondary | ICD-10-CM | POA: Diagnosis not present

## 2020-01-04 DIAGNOSIS — H25013 Cortical age-related cataract, bilateral: Secondary | ICD-10-CM | POA: Diagnosis not present

## 2020-01-04 DIAGNOSIS — H0015 Chalazion left lower eyelid: Secondary | ICD-10-CM | POA: Diagnosis not present

## 2020-01-08 DIAGNOSIS — H34811 Central retinal vein occlusion, right eye, with macular edema: Secondary | ICD-10-CM | POA: Diagnosis not present

## 2020-01-10 ENCOUNTER — Ambulatory Visit: Payer: Medicare Other

## 2020-01-29 ENCOUNTER — Telehealth: Payer: Self-pay | Admitting: Family Medicine

## 2020-01-29 NOTE — Telephone Encounter (Signed)
Patient's husband called to let us know the patient had both her covid vaccines done  They received the [place] vaccine  1st- 01/29 2nd-02/20

## 2020-02-03 ENCOUNTER — Ambulatory Visit (INDEPENDENT_AMBULATORY_CARE_PROVIDER_SITE_OTHER): Payer: Medicare Other | Admitting: *Deleted

## 2020-02-03 DIAGNOSIS — I495 Sick sinus syndrome: Secondary | ICD-10-CM | POA: Diagnosis not present

## 2020-02-03 LAB — CUP PACEART REMOTE DEVICE CHECK
Battery Impedance: 227 Ohm
Battery Remaining Longevity: 126 mo
Battery Voltage: 2.78 V
Brady Statistic AP VP Percent: 0 %
Brady Statistic AP VS Percent: 75 %
Brady Statistic AS VP Percent: 0 %
Brady Statistic AS VS Percent: 25 %
Date Time Interrogation Session: 20210303092636
Implantable Lead Implant Date: 20060620
Implantable Lead Implant Date: 20060620
Implantable Lead Location: 753859
Implantable Lead Location: 753860
Implantable Lead Model: 5076
Implantable Lead Model: 5076
Implantable Pulse Generator Implant Date: 20160219
Lead Channel Impedance Value: 714 Ohm
Lead Channel Impedance Value: 819 Ohm
Lead Channel Pacing Threshold Amplitude: 0.625 V
Lead Channel Pacing Threshold Amplitude: 0.75 V
Lead Channel Pacing Threshold Pulse Width: 0.4 ms
Lead Channel Pacing Threshold Pulse Width: 0.4 ms
Lead Channel Setting Pacing Amplitude: 2 V
Lead Channel Setting Pacing Amplitude: 2.5 V
Lead Channel Setting Pacing Pulse Width: 0.4 ms
Lead Channel Setting Sensing Sensitivity: 5.6 mV

## 2020-02-03 NOTE — Progress Notes (Signed)
PPM Remote  

## 2020-02-11 ENCOUNTER — Other Ambulatory Visit: Payer: Self-pay | Admitting: Family Medicine

## 2020-02-11 NOTE — Telephone Encounter (Signed)
Electronic refill request. Lorazepam Last office visit:   06/29/2019 Last Filled:     120 tablet 1 12/08/2019   Please advise.

## 2020-02-12 DIAGNOSIS — H34811 Central retinal vein occlusion, right eye, with macular edema: Secondary | ICD-10-CM | POA: Diagnosis not present

## 2020-02-12 NOTE — Telephone Encounter (Signed)
Sent. Thanks.   

## 2020-02-17 DIAGNOSIS — H1131 Conjunctival hemorrhage, right eye: Secondary | ICD-10-CM | POA: Diagnosis not present

## 2020-02-17 DIAGNOSIS — H5711 Ocular pain, right eye: Secondary | ICD-10-CM | POA: Diagnosis not present

## 2020-02-17 DIAGNOSIS — H43811 Vitreous degeneration, right eye: Secondary | ICD-10-CM | POA: Diagnosis not present

## 2020-02-17 DIAGNOSIS — H34811 Central retinal vein occlusion, right eye, with macular edema: Secondary | ICD-10-CM | POA: Diagnosis not present

## 2020-02-20 ENCOUNTER — Other Ambulatory Visit: Payer: Self-pay | Admitting: Family Medicine

## 2020-02-24 ENCOUNTER — Other Ambulatory Visit: Payer: Self-pay | Admitting: Cardiovascular Disease

## 2020-02-24 DIAGNOSIS — I119 Hypertensive heart disease without heart failure: Secondary | ICD-10-CM

## 2020-02-24 MED ORDER — DILTIAZEM HCL ER COATED BEADS 120 MG PO CP24: 120.0000 mg | ORAL_CAPSULE | Freq: Every day | ORAL | 2 refills | Status: DC

## 2020-03-25 DIAGNOSIS — H35033 Hypertensive retinopathy, bilateral: Secondary | ICD-10-CM | POA: Diagnosis not present

## 2020-03-25 DIAGNOSIS — H43391 Other vitreous opacities, right eye: Secondary | ICD-10-CM | POA: Diagnosis not present

## 2020-03-25 DIAGNOSIS — H34811 Central retinal vein occlusion, right eye, with macular edema: Secondary | ICD-10-CM | POA: Diagnosis not present

## 2020-03-25 DIAGNOSIS — H43811 Vitreous degeneration, right eye: Secondary | ICD-10-CM | POA: Diagnosis not present

## 2020-03-25 DIAGNOSIS — H3561 Retinal hemorrhage, right eye: Secondary | ICD-10-CM | POA: Diagnosis not present

## 2020-04-02 DIAGNOSIS — K37 Unspecified appendicitis: Secondary | ICD-10-CM

## 2020-04-02 HISTORY — DX: Unspecified appendicitis: K37

## 2020-04-06 DIAGNOSIS — H34811 Central retinal vein occlusion, right eye, with macular edema: Secondary | ICD-10-CM | POA: Diagnosis not present

## 2020-04-07 ENCOUNTER — Other Ambulatory Visit: Payer: Self-pay | Admitting: Family Medicine

## 2020-04-07 DIAGNOSIS — Z1231 Encounter for screening mammogram for malignant neoplasm of breast: Secondary | ICD-10-CM

## 2020-04-15 ENCOUNTER — Telehealth: Payer: Self-pay | Admitting: Gastroenterology

## 2020-04-15 ENCOUNTER — Observation Stay (HOSPITAL_COMMUNITY)
Admission: EM | Admit: 2020-04-15 | Discharge: 2020-04-18 | Disposition: A | Discharge status: Home / Self Care | Payer: Medicare Other | Attending: General Surgery | Admitting: General Surgery

## 2020-04-15 ENCOUNTER — Other Ambulatory Visit: Payer: Self-pay

## 2020-04-15 ENCOUNTER — Telehealth: Payer: Self-pay

## 2020-04-15 ENCOUNTER — Encounter (HOSPITAL_COMMUNITY): Payer: Self-pay | Admitting: Emergency Medicine

## 2020-04-15 DIAGNOSIS — Z85828 Personal history of other malignant neoplasm of skin: Secondary | ICD-10-CM | POA: Insufficient documentation

## 2020-04-15 DIAGNOSIS — G4733 Obstructive sleep apnea (adult) (pediatric): Secondary | ICD-10-CM | POA: Insufficient documentation

## 2020-04-15 DIAGNOSIS — I2510 Atherosclerotic heart disease of native coronary artery without angina pectoris: Secondary | ICD-10-CM | POA: Diagnosis not present

## 2020-04-15 DIAGNOSIS — Z885 Allergy status to narcotic agent status: Secondary | ICD-10-CM | POA: Insufficient documentation

## 2020-04-15 DIAGNOSIS — R109 Unspecified abdominal pain: Secondary | ICD-10-CM | POA: Diagnosis not present

## 2020-04-15 DIAGNOSIS — F329 Major depressive disorder, single episode, unspecified: Secondary | ICD-10-CM | POA: Insufficient documentation

## 2020-04-15 DIAGNOSIS — K3580 Unspecified acute appendicitis: Principal | ICD-10-CM | POA: Diagnosis present

## 2020-04-15 DIAGNOSIS — K219 Gastro-esophageal reflux disease without esophagitis: Secondary | ICD-10-CM | POA: Diagnosis not present

## 2020-04-15 DIAGNOSIS — K35891 Other acute appendicitis without perforation, with gangrene: Secondary | ICD-10-CM

## 2020-04-15 DIAGNOSIS — Z803 Family history of malignant neoplasm of breast: Secondary | ICD-10-CM | POA: Insufficient documentation

## 2020-04-15 DIAGNOSIS — F419 Anxiety disorder, unspecified: Secondary | ICD-10-CM | POA: Insufficient documentation

## 2020-04-15 DIAGNOSIS — K3589 Other acute appendicitis without perforation or gangrene: Secondary | ICD-10-CM | POA: Diagnosis not present

## 2020-04-15 DIAGNOSIS — I495 Sick sinus syndrome: Secondary | ICD-10-CM | POA: Diagnosis not present

## 2020-04-15 DIAGNOSIS — K449 Diaphragmatic hernia without obstruction or gangrene: Secondary | ICD-10-CM | POA: Insufficient documentation

## 2020-04-15 DIAGNOSIS — Z90710 Acquired absence of both cervix and uterus: Secondary | ICD-10-CM | POA: Diagnosis not present

## 2020-04-15 DIAGNOSIS — Z79899 Other long term (current) drug therapy: Secondary | ICD-10-CM | POA: Diagnosis not present

## 2020-04-15 DIAGNOSIS — Z833 Family history of diabetes mellitus: Secondary | ICD-10-CM | POA: Insufficient documentation

## 2020-04-15 DIAGNOSIS — I119 Hypertensive heart disease without heart failure: Secondary | ICD-10-CM | POA: Insufficient documentation

## 2020-04-15 DIAGNOSIS — I700 Atherosclerosis of aorta: Secondary | ICD-10-CM | POA: Diagnosis not present

## 2020-04-15 DIAGNOSIS — E7800 Pure hypercholesterolemia, unspecified: Secondary | ICD-10-CM | POA: Diagnosis not present

## 2020-04-15 DIAGNOSIS — Z20822 Contact with and (suspected) exposure to covid-19: Secondary | ICD-10-CM | POA: Diagnosis not present

## 2020-04-15 DIAGNOSIS — M5136 Other intervertebral disc degeneration, lumbar region: Secondary | ICD-10-CM | POA: Diagnosis not present

## 2020-04-15 DIAGNOSIS — Z880 Allergy status to penicillin: Secondary | ICD-10-CM | POA: Insufficient documentation

## 2020-04-15 DIAGNOSIS — K5900 Constipation, unspecified: Secondary | ICD-10-CM | POA: Diagnosis not present

## 2020-04-15 DIAGNOSIS — Z820 Family history of epilepsy and other diseases of the nervous system: Secondary | ICD-10-CM | POA: Insufficient documentation

## 2020-04-15 DIAGNOSIS — Z950 Presence of cardiac pacemaker: Secondary | ICD-10-CM | POA: Diagnosis not present

## 2020-04-15 DIAGNOSIS — M8580 Other specified disorders of bone density and structure, unspecified site: Secondary | ICD-10-CM | POA: Diagnosis not present

## 2020-04-15 DIAGNOSIS — R1030 Lower abdominal pain, unspecified: Secondary | ICD-10-CM | POA: Diagnosis not present

## 2020-04-15 DIAGNOSIS — Z7901 Long term (current) use of anticoagulants: Secondary | ICD-10-CM | POA: Insufficient documentation

## 2020-04-15 DIAGNOSIS — Z90721 Acquired absence of ovaries, unilateral: Secondary | ICD-10-CM | POA: Insufficient documentation

## 2020-04-15 DIAGNOSIS — Z888 Allergy status to other drugs, medicaments and biological substances status: Secondary | ICD-10-CM | POA: Insufficient documentation

## 2020-04-15 DIAGNOSIS — Z881 Allergy status to other antibiotic agents status: Secondary | ICD-10-CM | POA: Insufficient documentation

## 2020-04-15 DIAGNOSIS — K381 Appendicular concretions: Secondary | ICD-10-CM | POA: Insufficient documentation

## 2020-04-15 DIAGNOSIS — Z86010 Personal history of colonic polyps: Secondary | ICD-10-CM | POA: Diagnosis not present

## 2020-04-15 DIAGNOSIS — Z884 Allergy status to anesthetic agent status: Secondary | ICD-10-CM | POA: Insufficient documentation

## 2020-04-15 DIAGNOSIS — N281 Cyst of kidney, acquired: Secondary | ICD-10-CM | POA: Insufficient documentation

## 2020-04-15 DIAGNOSIS — E785 Hyperlipidemia, unspecified: Secondary | ICD-10-CM | POA: Diagnosis not present

## 2020-04-15 DIAGNOSIS — E871 Hypo-osmolality and hyponatremia: Secondary | ICD-10-CM | POA: Diagnosis not present

## 2020-04-15 DIAGNOSIS — E890 Postprocedural hypothyroidism: Secondary | ICD-10-CM | POA: Insufficient documentation

## 2020-04-15 DIAGNOSIS — Z8249 Family history of ischemic heart disease and other diseases of the circulatory system: Secondary | ICD-10-CM | POA: Insufficient documentation

## 2020-04-15 DIAGNOSIS — M1990 Unspecified osteoarthritis, unspecified site: Secondary | ICD-10-CM | POA: Insufficient documentation

## 2020-04-15 DIAGNOSIS — Z8261 Family history of arthritis: Secondary | ICD-10-CM | POA: Insufficient documentation

## 2020-04-15 DIAGNOSIS — K7689 Other specified diseases of liver: Secondary | ICD-10-CM | POA: Diagnosis not present

## 2020-04-15 DIAGNOSIS — Z96651 Presence of right artificial knee joint: Secondary | ICD-10-CM | POA: Insufficient documentation

## 2020-04-15 DIAGNOSIS — Z8041 Family history of malignant neoplasm of ovary: Secondary | ICD-10-CM | POA: Insufficient documentation

## 2020-04-15 LAB — COMPREHENSIVE METABOLIC PANEL
ALT: 17 U/L (ref 0–44)
AST: 16 U/L (ref 15–41)
Albumin: 4.1 g/dL (ref 3.5–5.0)
Alkaline Phosphatase: 50 U/L (ref 38–126)
Anion gap: 11 (ref 5–15)
BUN: 12 mg/dL (ref 8–23)
CO2: 24 mmol/L (ref 22–32)
Calcium: 8.7 mg/dL — ABNORMAL LOW (ref 8.9–10.3)
Chloride: 95 mmol/L — ABNORMAL LOW (ref 98–111)
Creatinine, Ser: 0.92 mg/dL (ref 0.44–1.00)
GFR calc Af Amer: >60 mL/min (ref >60)
GFR calc non Af Amer: >60 mL/min (ref >60)
Glucose, Bld: 118 mg/dL — ABNORMAL HIGH (ref 70–99)
Potassium: 4 mmol/L (ref 3.5–5.1)
Sodium: 130 mmol/L — ABNORMAL LOW (ref 135–145)
Total Bilirubin: 0.8 mg/dL (ref 0.3–1.2)
Total Protein: 6.8 g/dL (ref 6.5–8.1)

## 2020-04-15 LAB — URINALYSIS, ROUTINE W REFLEX MICROSCOPIC
Bacteria, UA: NONE SEEN
Bilirubin Urine: NEGATIVE
Glucose, UA: NEGATIVE mg/dL
Ketones, ur: NEGATIVE mg/dL
Leukocytes,Ua: NEGATIVE
Nitrite: NEGATIVE
Protein, ur: NEGATIVE mg/dL
Specific Gravity, Urine: 1.008 (ref 1.005–1.030)
pH: 5 (ref 5.0–8.0)

## 2020-04-15 LAB — CBC
HCT: 39.5 % (ref 36.0–46.0)
Hemoglobin: 13.6 g/dL (ref 12.0–15.0)
MCH: 33.4 pg (ref 26.0–34.0)
MCHC: 34.4 g/dL (ref 30.0–36.0)
MCV: 97.1 fL (ref 80.0–100.0)
Platelets: 188 10*3/uL (ref 150–400)
RBC: 4.07 MIL/uL (ref 3.87–5.11)
RDW: 12 % (ref 11.5–15.5)
WBC: 9.5 10*3/uL (ref 4.0–10.5)
nRBC: 0 % (ref 0.0–0.2)

## 2020-04-15 LAB — LIPASE, BLOOD: Lipase: 29 U/L (ref 11–51)

## 2020-04-15 NOTE — Telephone Encounter (Signed)
Pt's husband (DPR signed) said pt started last night with rt lower abd pain; pt has feeling of bruised lower abd pain & swelling and had small BM this morning; then tried glycerin supp with no relief; now abd pain is at a 9. Last normal BM was 04/14/20 in  AM. pts husband said that has already spoke with GI and was advised to try miralax and if that does not work to go to ED. Advised pt with her pain level and abd swelling and having normal BM on 04/14/20 pt should go to ED for eval. pts husband said pt is presently soaking in warm bath tub and will try to drink more water to see if miralax will work and if it does not work then pts husband will take pt to ED. FYI to Dr Damita Dunnings.

## 2020-04-15 NOTE — Telephone Encounter (Signed)
Patient reports abdominal pain that started last night.  She had a Bm last night , but questions if she is still contstipated.  She reports pain is right lower quadrant.  She had an appendectomy with her hysterectomy in the 1970s.  She will try miralax daily.  If her pain continues or worsens she is advised that she will need to go to the ED for evaluation over the weekend.

## 2020-04-15 NOTE — Telephone Encounter (Signed)
Noted. Thanks.

## 2020-04-15 NOTE — ED Triage Notes (Signed)
Patient c/o abdominal pain onset of 8pm last night. Called GI today and was unable to get in. Took Miralax w/o relief. Had BM yesterday morning but has progressively felt bloated since. Pain worse to lower right quadrant. Denies any N/V or any urinary symptoms.

## 2020-04-16 ENCOUNTER — Emergency Department (HOSPITAL_COMMUNITY): Payer: Medicare Other

## 2020-04-16 ENCOUNTER — Encounter (HOSPITAL_COMMUNITY): Payer: Self-pay

## 2020-04-16 DIAGNOSIS — R001 Bradycardia, unspecified: Secondary | ICD-10-CM | POA: Diagnosis not present

## 2020-04-16 DIAGNOSIS — R109 Unspecified abdominal pain: Secondary | ICD-10-CM | POA: Diagnosis not present

## 2020-04-16 DIAGNOSIS — K3580 Unspecified acute appendicitis: Secondary | ICD-10-CM | POA: Diagnosis not present

## 2020-04-16 DIAGNOSIS — I10 Essential (primary) hypertension: Secondary | ICD-10-CM

## 2020-04-16 DIAGNOSIS — Z01810 Encounter for preprocedural cardiovascular examination: Secondary | ICD-10-CM | POA: Diagnosis not present

## 2020-04-16 LAB — CBC
HCT: 40.2 % (ref 36.0–46.0)
Hemoglobin: 13.5 g/dL (ref 12.0–15.0)
MCH: 32.5 pg (ref 26.0–34.0)
MCHC: 33.6 g/dL (ref 30.0–36.0)
MCV: 96.6 fL (ref 80.0–100.0)
Platelets: 182 10*3/uL (ref 150–400)
RBC: 4.16 MIL/uL (ref 3.87–5.11)
RDW: 11.9 % (ref 11.5–15.5)
WBC: 6.4 10*3/uL (ref 4.0–10.5)
nRBC: 0 % (ref 0.0–0.2)

## 2020-04-16 LAB — SARS CORONAVIRUS 2 BY RT PCR (HOSPITAL ORDER, PERFORMED IN ~~LOC~~ HOSPITAL LAB): SARS Coronavirus 2: NEGATIVE

## 2020-04-16 MED ORDER — IOHEXOL 300 MG/ML  SOLN
100.0000 mL | Freq: Once | INTRAMUSCULAR | Status: AC | PRN
  Administered 2020-04-16: 100 mL via INTRAVENOUS

## 2020-04-16 MED ORDER — ACETAMINOPHEN 500 MG PO TABS
1000.0000 mg | ORAL_TABLET | Freq: Four times a day (QID) | ORAL | Status: DC
  Administered 2020-04-16 – 2020-04-18 (×4): 1000 mg via ORAL
  Filled 2020-04-16 (×6): qty 2

## 2020-04-16 MED ORDER — ENOXAPARIN SODIUM 40 MG/0.4ML ~~LOC~~ SOLN
40.0000 mg | SUBCUTANEOUS | Status: DC
  Filled 2020-04-16 (×2): qty 0.4

## 2020-04-16 MED ORDER — FAMOTIDINE 20 MG PO TABS
20.0000 mg | ORAL_TABLET | Freq: Two times a day (BID) | ORAL | Status: DC
  Administered 2020-04-16 – 2020-04-18 (×5): 20 mg via ORAL
  Filled 2020-04-16 (×6): qty 1

## 2020-04-16 MED ORDER — ONDANSETRON HCL 4 MG/2ML IJ SOLN: 4.0000 mg | Freq: Four times a day (QID) | INTRAMUSCULAR | Status: DC | PRN

## 2020-04-16 MED ORDER — KETOROLAC TROMETHAMINE 15 MG/ML IJ SOLN: 15.0000 mg | Freq: Four times a day (QID) | INTRAMUSCULAR | Status: DC | PRN

## 2020-04-16 MED ORDER — DOCUSATE SODIUM 100 MG PO CAPS
100.0000 mg | ORAL_CAPSULE | Freq: Two times a day (BID) | ORAL | Status: DC
  Administered 2020-04-16 – 2020-04-18 (×4): 100 mg via ORAL
  Filled 2020-04-16 (×4): qty 1

## 2020-04-16 MED ORDER — ONDANSETRON 4 MG PO TBDP: 4.0000 mg | ORAL_TABLET | Freq: Four times a day (QID) | ORAL | Status: DC | PRN

## 2020-04-16 MED ORDER — KETOROLAC TROMETHAMINE 15 MG/ML IJ SOLN
15.0000 mg | Freq: Four times a day (QID) | INTRAMUSCULAR | Status: AC
  Filled 2020-04-16: qty 1

## 2020-04-16 MED ORDER — ONDANSETRON HCL 4 MG/2ML IJ SOLN
4.0000 mg | Freq: Once | INTRAMUSCULAR | Status: DC
  Filled 2020-04-16: qty 2

## 2020-04-16 MED ORDER — DILTIAZEM HCL ER COATED BEADS 120 MG PO CP24
120.0000 mg | ORAL_CAPSULE | Freq: Every day | ORAL | Status: DC
  Administered 2020-04-16 – 2020-04-18 (×3): 120 mg via ORAL
  Filled 2020-04-16 (×3): qty 1

## 2020-04-16 MED ORDER — LORAZEPAM 1 MG PO TABS
1.0000 mg | ORAL_TABLET | Freq: Four times a day (QID) | ORAL | Status: DC | PRN
  Administered 2020-04-16 – 2020-04-18 (×3): 1 mg via ORAL
  Filled 2020-04-16 (×3): qty 1

## 2020-04-16 MED ORDER — SODIUM CHLORIDE 0.9 % IV SOLN: INTRAVENOUS | Status: DC

## 2020-04-16 MED ORDER — SODIUM CHLORIDE 0.9 % IV SOLN
1.0000 g | Freq: Three times a day (TID) | INTRAVENOUS | Status: DC
  Administered 2020-04-16 – 2020-04-18 (×6): 1 g via INTRAVENOUS
  Filled 2020-04-16 (×8): qty 1

## 2020-04-16 MED ORDER — SODIUM CHLORIDE 0.9 % IV SOLN: 1.0000 g | Freq: Three times a day (TID) | INTRAVENOUS | Status: DC

## 2020-04-16 MED ORDER — METHOCARBAMOL 500 MG PO TABS
1000.0000 mg | ORAL_TABLET | Freq: Three times a day (TID) | ORAL | Status: DC
Start: 2020-04-16 — End: 2020-04-16

## 2020-04-16 NOTE — ED Provider Notes (Signed)
[facility] EMERGENCY DEPARTMENT Provider Note   CSN: [id number] Arrival date & time: 04/15/20  1822     History Chief Complaint  Patient presents with  . Abdominal Pain    [name] is a 74 y.o. female.  74 y.o female with a PMH of HTN, PVC, presents to the ED with a chief complaint of abdominal pain x 2 days. Patient describes constant "soreness" to the lower abdomen exacerbated with attempting to have a bowel movement.  She reports her last bowel movement was Thursday and this was normal in nature.  Was able to evacuate some of her bowels yesterday with small amounts but no blood in her stool.  She attempted to reach her gastroenterologist Dr. Fuller Plan who recommended MiraLAX, reports this has not improved her symptoms.  Does report she usually is able to defecate daily however has failed to do so this past week.  She also endorses abdominal distention, feeling overall "very bloated ".  He does have history of multiple surgeries to her abdomen including C-sections, hernia removal.  No fever, nausea, vomiting, or urinary symptoms.   The history is provided by the patient and medical records.  Abdominal Pain Associated symptoms: constipation   Associated symptoms: no chest pain, no diarrhea, no fever, no nausea, no shortness of breath, no sore throat and no vomiting        Past Medical History:  Diagnosis Date  . Adenomatous colon polyp 12/2006  . Allergic rhinitis   . Anemia   . Arthritis   . Bradycardia    s/p MDT PPM  . Depression   . Esophageal stricture   . GERD (gastroesophageal reflux disease)   . Hemorrhoids   . Hyperlipidemia   . Hypertension   . Inguinal hernia   . Osteopenia 12/2012   T score -1.7 FRAX 8.5%/1.1%  . PVC (premature ventricular contraction)   . Scleritis of right eye   . Skin cancer    Basal and squamous cell  . Sleep apnea    a. intolerant of CPAP  . Subdural hematoma ([place])    a. spontanous 09/2017 b. resolved without  intervention  . Thyroid disease   . Vitreous detachment of both eyes     Patient Active Problem List   Diagnosis Date Noted  . Acute appendicitis 04/16/2020  . HTN (hypertension) 06/11/2018  . Subdural hematoma ([place]) 09/26/2017  . Health care maintenance 05/10/2016  . History of thyroid disease 05/10/2016  . Osteoarthritis of neck 05/10/2016  . S/P total knee arthroplasty 12/20/2014  . Medicare annual wellness visit, subsequent 10/12/2014  . Advance care planning 10/12/2014  . Palpitations 09/23/2013  . Fatigue 08/07/2013  . Alopecia 05/11/2013  . Benign hypertensive heart disease without heart failure 01/07/2013  . Pacemaker-Medtronic 10/08/2012  . Hiatal hernia 05/28/2012  . Esophageal stricture 05/28/2012  . Osteoporosis screening 01/16/2012  . Anxiety 01/16/2012  . Sick sinus syndrome ([place])   . Osteoarthritis of knee 04/19/2011  . Internal hemorrhoids 02/28/2011  . Constipation 02/28/2011  . GERD 06/28/2009  . Hypercholesterolemia 03/30/2008  . Obstructive sleep apnea 03/30/2008  . History of SCC (squamous cell carcinoma) of skin 03/30/2008    Past Surgical History:  Procedure Laterality Date  . ABDOMINAL HYSTERECTOMY    . BREAST SURGERY     Biopsy-benign  . CESAREAN SECTION    . DILATION AND CURETTAGE OF UTERUS    . ESOPHAGOGASTRODUODENOSCOPY     esophageal stretching  . HAND SURGERY  1992  . HERNIA  REPAIR    . INGUINAL HERNIA REPAIR  02/12/1994   left inguinal hernia repair  . KNEE ARTHROSCOPY  2011  . OOPHORECTOMY  1995   LSO and RSO  . PACEMAKER GENERATOR CHANGE N/A 01/21/2015   Procedure: PACEMAKER GENERATOR CHANGE;  Surgeon: [name], MD;  Location: [facility] CATH LAB;  Service: Cardiovascular;  Laterality: N/A;  . PACEMAKER INSERTION  2006   Dr. Verlon Setting  . POLYPECTOMY    . Tear duct surgery  2010  . THUMB SURGERY    . THYROIDECTOMY  1979   RIGHT SIDE  . TOTAL KNEE ARTHROPLASTY Right 12/20/2014   Procedure: RIGHT TOTAL KNEE ARTHROPLASTY;  Surgeon:  Vickey Huger, MD;  Location: [place];  Service: Orthopedics;  Laterality: Right;     OB History    Gravida  2   Para  1   Term  1   Preterm      AB  1   Living  1     SAB  1   TAB      Ectopic      Multiple      Live Births              Family History  Problem Relation Age of Onset  . Heart failure Sister   . Heart disease Sister   . Hypertension Sister   . Dementia Mother   . Hypertension Mother   . Hypertension Father   . Arthritis Father   . Breast cancer Maternal Aunt        Age 80's  . Diabetes Maternal Aunt   . Diabetes Maternal Uncle   . Ovarian cancer Maternal Aunt   . Colon cancer Neg Hx     Social History   Tobacco Use  . Smoking status: Never Smoker  . Smokeless tobacco: Never Used  Substance Use Topics  . Alcohol use: No    Alcohol/week: 0.0 standard drinks  . Drug use: No    Home Medications Prior to Admission medications   Medication Sig Start Date End Date Taking? Authorizing Provider  acetaminophen (TYLENOL) 500 MG tablet Take 1,000 mg by mouth every 6 (six) hours as needed (pain).   Yes [provider]  Carboxymethylcell-Hypromellose (GENTEAL) 0.25-0.3 % GEL Place 1 application into both eyes at bedtime.   Yes [provider]  diltiazem (CARTIA XT) 120 MG 24 hr capsule Take 1 capsule (120 mg total) by mouth daily. 02/24/20  Yes Burnell Blanks, MD  EQ FAMOTIDINE MAX ST 20 MG tablet Take 1 tablet by mouth twice daily 02/22/20  Yes Tonia Ghent, MD  LORazepam (ATIVAN) 1 MG tablet TAKE 1 TABLET BY MOUTH EVERY 6 HOURS AS NEEDED Patient taking differently: Take 1 mg by mouth every 6 (six) hours as needed.  02/12/20  Yes Tonia Ghent, MD  metroNIDAZOLE (METROGEL) 0.75 % gel Apply 1 application topically as needed. 08/19/19  Yes [provider]  nitroGLYCERIN (NITROSTAT) 0.4 MG SL tablet Place 1 tablet (0.4 mg total) under the tongue every 5 (five) minutes as needed. For up to 3 doses 04/27/16  Yes  Burnell Blanks, MD  Polyethyl Glycol-Propyl Glycol (SYSTANE OP) Place 1 drop into both eyes daily as needed (dry eyes).   Yes [provider]  terconazole (TERAZOL 7) 0.4 % vaginal cream Place 1 applicator vaginally at bedtime. For 7 nights 11/24/19  Yes Fontaine, Belinda Block, MD    Allergies    Hydrocodone; Antihistamines, loratadine-type; Bisoprolol-hydrochlorothiazide; Cephalexin; Codeine; Demerol [meperidine];  Epinephrine; Erythromycin; Hyoscyamine sulfate; Inderal [propranolol]; Lidocaine; Lipitor [atorvastatin]; Losartan potassium; Metoprolol; Morphine; Nebivolol; Paxil [paroxetine hcl]; Penicillins; Prednisone; Propranolol hcl; Sertraline hcl; Tetracycline; Toprol xl [metoprolol succinate]; Verapamil; Hydralazine; and Oxycodone-acetaminophen  Review of Systems   Review of Systems  Constitutional: Negative for fever.  HENT: Negative for sore throat.   Respiratory: Negative for shortness of breath.   Cardiovascular: Negative for chest pain.  Gastrointestinal: Positive for abdominal distention, abdominal pain and constipation. Negative for blood in stool, diarrhea, nausea and vomiting.  Genitourinary: Negative for flank pain.  Musculoskeletal: Negative for back pain.  Skin: Negative for pallor and wound.  Neurological: Negative for light-headedness and headaches.  All other systems reviewed and are negative.   Physical Exam Updated Vital Signs BP (!) 151/91   Pulse 69   Temp 99.1 F (37.3 C) (Oral)   Resp 16   Ht 5' 4.5" (1.638 m)   Wt 58.5 kg   SpO2 95%   BMI 21.80 kg/m   Physical Exam Vitals and nursing note reviewed.  Constitutional:      Appearance: She is well-developed. She is not ill-appearing or toxic-appearing.  HENT:     Head: Normocephalic and atraumatic.  Cardiovascular:     Rate and Rhythm: Normal rate.  Pulmonary:     Effort: Pulmonary effort is normal.     Breath sounds: No wheezing or rales.  Abdominal:     General: A surgical scar is  present. Bowel sounds are increased. There is distension.     Palpations: Abdomen is soft.     Tenderness: There is abdominal tenderness in the right lower quadrant, suprapubic area and left lower quadrant. There is rebound. There is no right CVA tenderness or left CVA tenderness.     Hernia: No hernia is present.  Skin:    General: Skin is warm and dry.  Neurological:     Mental Status: She is alert and oriented to person, place, and time.     ED Results / Procedures / Treatments   Labs (all labs ordered are listed, but only abnormal results are displayed) Labs Reviewed  COMPREHENSIVE METABOLIC PANEL - Abnormal; Notable for the following components:      Result Value   Sodium 130 (*)    Chloride 95 (*)    Glucose, Bld 118 (*)    Calcium 8.7 (*)    All other components within normal limits  URINALYSIS, ROUTINE W REFLEX MICROSCOPIC - Abnormal; Notable for the following components:   Color, Urine STRAW (*)    Hgb urine dipstick SMALL (*)    All other components within normal limits  SARS CORONAVIRUS 2 BY RT PCR (HOSPITAL ORDER, [facility])  LIPASE, BLOOD  CBC  CBC    EKG None  Radiology CT ABDOMEN PELVIS W CONTRAST  Result Date: 04/16/2020 CLINICAL DATA:  Suspected bowel obstruction. Abdominal pain. EXAM: CT ABDOMEN AND PELVIS WITH CONTRAST TECHNIQUE: Multidetector CT imaging of the abdomen and pelvis was performed using the standard protocol following bolus administration of intravenous contrast. CONTRAST:  149mL OMNIPAQUE IOHEXOL 300 MG/ML  SOLN COMPARISON:  08/20/2017 FINDINGS: Lower chest: Mild dependent changes are noted within the posterior lung bases. Calcifications identified within the RCA, lad coronary arteries. Left chest wall pacemaker noted with leads in the right atrial appendage and right ventricle. Hepatobiliary: There are multiple scattered liver cysts within both lobes of the liver. The largest of these measures up to 3.5 cm, image  23/3. No suspicious liver lesions. Gallbladder appears  normal. No biliary ductal dilatation. Pancreas: Unremarkable. No pancreatic ductal dilatation or surrounding inflammatory changes. Spleen: Normal in size without focal abnormality. Adrenals/Urinary Tract: Normal appearance of the adrenal glands. The right kidney appears normal. 1.3 cm left lower pole kidney cyst. Urinary bladder appears normal. Stomach/Bowel: Stomach is normal and nondistended. There is no bowel wall thickening, inflammation or distension. The appendix is abnormally thickened with enhancement and surrounding inflammation. Tiny appendicular lith is identified within the appendiceal lumen measuring 2 mm. Small volume of free fluid is identified within the right lower quadrant. Vascular/Lymphatic: Aortic atherosclerosis. No enlarged abdominal or pelvic lymph nodes. Reproductive: Status post hysterectomy. No adnexal masses. Other: No abdominal wall hernia or abnormality. Musculoskeletal: Degenerative disc disease identified within the lower lumbar spine at the L4-5 and L5-S1 level. IMPRESSION: 1. Examination is positive for acute appendicitis. No evidence for perforation or abscess. 2. Liver and left kidney cysts. 3. Aortic atherosclerosis, in addition to LAD and RCA coronary artery disease. Please note that although the presence of coronary artery calcium documents the presence of coronary artery disease, the severity of this disease and any potential stenosis cannot be assessed on this non-gated CT examination. Assessment for potential risk factor modification, dietary therapy or pharmacologic therapy may be warranted, if clinically indicated. Aortic Atherosclerosis (ICD10-I70.0). 4. Left chest wall pacemaker. Electronically Signed   By: Kerby Moors M.D.   On: 04/16/2020 10:23    Procedures Procedures (including critical care time)  Medications Ordered in ED Medications  meropenem (MERREM) 1 g in sodium chloride 0.9 % 100 mL IVPB (0 g  Intravenous Stopped 04/16/20 1230)  ondansetron (ZOFRAN) injection 4 mg (4 mg Intravenous Refused 04/16/20 1258)  enoxaparin (LOVENOX) injection 40 mg (has no administration in time range)  0.9 %  sodium chloride infusion (has no administration in time range)  acetaminophen (TYLENOL) tablet 1,000 mg (has no administration in time range)  docusate sodium (COLACE) capsule 100 mg (has no administration in time range)  ondansetron (ZOFRAN-ODT) disintegrating tablet 4 mg (has no administration in time range)    Or  ondansetron (ZOFRAN) injection 4 mg (has no administration in time range)  ketorolac (TORADOL) 15 MG/ML injection 15 mg (has no administration in time range)    Followed by  ketorolac (TORADOL) 15 MG/ML injection 15 mg (has no administration in time range)  diltiazem (CARDIZEM CD) 24 hr capsule 120 mg (has no administration in time range)  famotidine (PEPCID) tablet 20 mg (has no administration in time range)  LORazepam (ATIVAN) tablet 1 mg (has no administration in time range)  iohexol (OMNIPAQUE) 300 MG/ML solution 100 mL (100 mLs Intravenous Contrast Given 04/16/20 [id number])    ED Course  I have reviewed the triage vital signs and the nursing notes.  Pertinent labs & imaging results that were available during my care of the patient were reviewed by me and considered in my medical decision making (see chart for details).    MDM Rules/Calculators/A&P   Patient with a past medical history of an appendectomy presents to the ED with complaints of lower abdominal pain for the past 2 days along with constipation.  Had small bowel movement 2 days ago, reports last bowel movement was normal in nature without any blood.  Has taken some glycerin syrup, MiraLAX without being able to evacuate her bowels.  Feels like she is straining.  However, she did have somewhat of a small bowel movement yesterday.  Does have an extensive amount of past surgical history to her abdomen including appendectomy,  multiple C-sections, hernia removals.  During evaluation patient is non-ill-appearing, has been in the ED for 12 hours prior to evaluation by me.  Lungs are clear to auscultation, there is significant tenderness to palpation along the lower abdomen, bowel sounds are hyperactive.Vitals are within normal limits, she is in no distress. There is no guarding on exam, but pain with palpation of lower abdomen, no prior history of diverticulosis. Differential diagnosis included but not limited to diverticulitis, SBO, constipation versus nephrolithiasis.    To rotation of labs by me, CBC without any leukocytosis, no signs of anemia.  CMP with some mild hyponatremia, rest of electrolytes are within baseline.  Creatinine function is within normal limits, denies any urinary symptoms, denies any CVA, no prior history of kidney stones lower suspicion for nephrolithiasis.  LFTs are within normal limits, she has had no nausea, vomiting, lower suspicion for gallbladder pathology.  Lipase level is within normal limits.  UA with a small amount of hemoglobin.  CT abdomen showed:  1. Examination is positive for acute appendicitis. No evidence for  perforation or abscess.  2. Liver and left kidney cysts.  3. Aortic atherosclerosis, in addition to LAD and RCA coronary  artery disease. Please note that although the presence of coronary  artery calcium documents the presence of coronary artery disease,  the severity of this disease and any potential stenosis cannot be  assessed on this non-gated CT examination. Assessment for potential  risk factor modification, dietary therapy or pharmacologic therapy  may be warranted, if clinically indicated. Aortic Atherosclerosis  (ICD10-I70.0).  4. Left chest wall pacemaker.       Patient has had Covid testing ordered, general surgery consult was placed.  After sensitive review of her allergy list, meropenem and seem like a good choice for prophylactic intra-abdominal  infection.  We will provide her with 1 g now.  Pending general surgery consult.  11:28 AM Spoke to [facility] general surgery who will evaluate patient in the ED.  1:18 PM she has been evaluated by general surgery, Dr. Bobbye Morton.    Portions of this note were generated with [employer]. Dictation errors may occur despite best attempts at proofreading.  Final Clinical Impression(s) / ED Diagnoses Final diagnoses:  Lower abdominal pain  Other acute appendicitis without perforation, with gangrene    Rx / DC Orders ED Discharge Orders    None       Janeece Fitting, PA-C 04/16/20 1320    Mesner, Corene Cornea, MD 04/16/20 2354

## 2020-04-16 NOTE — ED Notes (Signed)
MD at bedside. 

## 2020-04-16 NOTE — Progress Notes (Signed)
Patient hesitant to take lovenox due to her history of subdural hematoma. Provider notified via text message.

## 2020-04-16 NOTE — H&P (Addendum)
Reason for Consult/Chief Complaint: acute appendicitis Consultant: Rex Kras, MD  [name] is an 74 y.o. female.   HPI: 41F with abdominal pain since 04/14/2020. She denies any associated symptoms of nausea, vomiting, anorexia, diarrhea. She describes the pain as crampy in nature and localizes it to the right lower quadrant. Her last BM was on 04/14/2020. She tried miralax at the direction of her gastroenterologist without relief, causing her to present to the ED. She does report that she was told her gynecologist removed her appendix at the time of her hysterectomy 20 years ago.  Past Medical History:  Diagnosis Date  . Adenomatous colon polyp 12/2006  . Allergic rhinitis   . Anemia   . Arthritis   . Bradycardia    s/p MDT PPM  . Depression   . Esophageal stricture   . GERD (gastroesophageal reflux disease)   . Hemorrhoids   . Hyperlipidemia   . Hypertension   . Inguinal hernia   . Osteopenia 12/2012   T score -1.7 FRAX 8.5%/1.1%  . PVC (premature ventricular contraction)   . Scleritis of right eye   . Skin cancer    Basal and squamous cell  . Sleep apnea    a. intolerant of CPAP  . Subdural hematoma ([place])    a. spontanous 09/2017 b. resolved without intervention  . Thyroid disease   . Vitreous detachment of both eyes     Past Surgical History:  Procedure Laterality Date  . ABDOMINAL HYSTERECTOMY    . BREAST SURGERY     Biopsy-benign  . CESAREAN SECTION    . DILATION AND CURETTAGE OF UTERUS    . ESOPHAGOGASTRODUODENOSCOPY     esophageal stretching  . HAND SURGERY  1992  . HERNIA REPAIR    . INGUINAL HERNIA REPAIR  02/12/1994   left inguinal hernia repair  . KNEE ARTHROSCOPY  2011  . OOPHORECTOMY  1995   LSO and RSO  . PACEMAKER GENERATOR CHANGE N/A 01/21/2015   Procedure: PACEMAKER GENERATOR CHANGE;  Surgeon: [name], MD;  Location: [facility] CATH LAB;  Service: Cardiovascular;  Laterality: N/A;  . PACEMAKER INSERTION  2006   Dr. Verlon Setting  . POLYPECTOMY    .  Tear duct surgery  2010  . THUMB SURGERY    . THYROIDECTOMY  1979   RIGHT SIDE  . TOTAL KNEE ARTHROPLASTY Right 12/20/2014   Procedure: RIGHT TOTAL KNEE ARTHROPLASTY;  Surgeon: Vickey Huger, MD;  Location: [facility];  Service: Orthopedics;  Laterality: Right;    Family History  Problem Relation Age of Onset  . Heart failure Sister   . Heart disease Sister   . Hypertension Sister   . Dementia Mother   . Hypertension Mother   . Hypertension Father   . Arthritis Father   . Breast cancer Maternal Aunt        Age 1's  . Diabetes Maternal Aunt   . Diabetes Maternal Uncle   . Ovarian cancer Maternal Aunt   . Colon cancer Neg Hx     Social History:  reports that she has never smoked. She has never used smokeless tobacco. She reports that she does not drink alcohol or use drugs.  Allergies:  Allergies  Allergen Reactions  . Hydrocodone     Increased HR  . Antihistamines, Loratadine-Type Other (See Comments)    Heart races/jittery  . Bisoprolol-Hydrochlorothiazide Other (See Comments)    Couldn't tolerate UNKNOWN REACTION  . Cephalexin Nausea And Vomiting  . Codeine Other (See Comments)  Increased heart rate, anxiety  . Demerol [Meperidine] Other (See Comments)    Increased heart rate, anxiety  . Epinephrine Other (See Comments)    Increased heart rate  . Erythromycin Nausea And Vomiting  . Hyoscyamine Sulfate Other (See Comments)    Pt could not tolerate this med UNKNOWN reaction  . Inderal [Propranolol] Other (See Comments)    Extreme tiredness  . Lidocaine Other (See Comments)    Family hx of malignant hyperthermia  . Lipitor [Atorvastatin] Other (See Comments)    Muscle aches  . Losartan Potassium Other (See Comments)    Pt could not tolerate this med UNKNOWN REACTION  . Metoprolol Swelling  . Morphine Other (See Comments)    Nightmares, increased BP  . Nebivolol Other (See Comments)    angioedema  . Paxil [Paroxetine Hcl] Other (See Comments)    "drove her up  the wall"  . Penicillins Nausea And Vomiting  . Prednisone Other (See Comments)    Tolerates low doses if absolutely necessary - makes her nervous, jittery  . Propranolol Hcl Other (See Comments)    Pt could not tolerate this med UNKNOWN REACTION  . Sertraline Hcl Other (See Comments)    Pt could not tolerate this med UNKNOWN REACTION  . Tetracycline Nausea And Vomiting  . Toprol Xl [Metoprolol Succinate] Other (See Comments)    Extreme tiredness  . Verapamil Other (See Comments)    Pt could not tolerate this med UNKNOWN REACTION  . Hydralazine     headaches  . Oxycodone-Acetaminophen Palpitations    Causes accelerated heart rate    Medications: I have reviewed the patient's current medications.  Results for orders placed or performed during the hospital encounter of 04/15/20 (from the past 48 hour(s))  Lipase, blood     Status: None   Collection Time: 04/15/20  6:38 PM  Result Value Ref Range   Lipase 29 11 - 51 U/L    Comment: Performed at [facility], [place] [address], [place], [place] [address]  Comprehensive metabolic panel     Status: Abnormal   Collection Time: 04/15/20  6:38 PM  Result Value Ref Range   Sodium 130 (L) 135 - 145 mmol/L   Potassium 4.0 3.5 - 5.1 mmol/L   Chloride 95 (L) 98 - 111 mmol/L   CO2 24 22 - 32 mmol/L   Glucose, Bld 118 (H) 70 - 99 mg/dL    Comment: Glucose reference range applies only to samples taken after fasting for at least 8 hours.   BUN 12 8 - 23 mg/dL   Creatinine, Ser 0.92 0.44 - 1.00 mg/dL   Calcium 8.7 (L) 8.9 - 10.3 mg/dL   Total Protein 6.8 6.5 - 8.1 g/dL   Albumin 4.1 3.5 - 5.0 g/dL   AST 16 15 - 41 U/L   ALT 17 0 - 44 U/L   Alkaline Phosphatase 50 38 - 126 U/L   Total Bilirubin 0.8 0.3 - 1.2 mg/dL   GFR calc non Af Amer >60 >60 mL/min   GFR calc Af Amer >60 >60 mL/min   Anion gap 11 5 - 15    Comment: Performed at [facility], [place] [address] [place] [address]  CBC     Status: None   Collection  Time: 04/15/20  6:38 PM  Result Value Ref Range   WBC 9.5 4.0 - 10.5 K/uL   RBC 4.07 3.87 - 5.11 MIL/uL   Hemoglobin 13.6 12.0 - 15.0 g/dL  HCT 39.5 36.0 - 46.0 %   MCV 97.1 80.0 - 100.0 fL   MCH 33.4 26.0 - 34.0 pg   MCHC 34.4 30.0 - 36.0 g/dL   RDW 12.0 11.5 - 15.5 %   Platelets 188 150 - 400 K/uL   nRBC 0.0 0.0 - 0.2 %    Comment: Performed at [facility], [place] [address], [place], [place] [address]  Urinalysis, Routine w reflex microscopic     Status: Abnormal   Collection Time: 04/15/20  6:56 PM  Result Value Ref Range   Color, Urine STRAW (A) YELLOW   APPearance CLEAR CLEAR   Specific Gravity, Urine 1.008 1.005 - 1.030   pH 5.0 5.0 - 8.0   Glucose, UA NEGATIVE NEGATIVE mg/dL   Hgb urine dipstick SMALL (A) NEGATIVE   Bilirubin Urine NEGATIVE NEGATIVE   Ketones, ur NEGATIVE NEGATIVE mg/dL   Protein, ur NEGATIVE NEGATIVE mg/dL   Nitrite NEGATIVE NEGATIVE   Leukocytes,Ua NEGATIVE NEGATIVE   RBC / HPF 6-10 0 - 5 RBC/hpf   WBC, UA 0-5 0 - 5 WBC/hpf   Bacteria, UA NONE SEEN NONE SEEN    Comment: Performed at [facility] [address], [place], [place] [address]    CT ABDOMEN PELVIS W CONTRAST  Result Date: 04/16/2020 CLINICAL DATA:  Suspected bowel obstruction. Abdominal pain. EXAM: CT ABDOMEN AND PELVIS WITH CONTRAST TECHNIQUE: Multidetector CT imaging of the abdomen and pelvis was performed using the standard protocol following bolus administration of intravenous contrast. CONTRAST:  18mL OMNIPAQUE IOHEXOL 300 MG/ML  SOLN COMPARISON:  08/20/2017 FINDINGS: Lower chest: Mild dependent changes are noted within the posterior lung bases. Calcifications identified within the RCA, lad coronary arteries. Left chest wall pacemaker noted with leads in the right atrial appendage and right ventricle. Hepatobiliary: There are multiple scattered liver cysts within both lobes of the liver. The largest of these measures up to 3.5 cm, image 23/3. No suspicious liver lesions.  Gallbladder appears normal. No biliary ductal dilatation. Pancreas: Unremarkable. No pancreatic ductal dilatation or surrounding inflammatory changes. Spleen: Normal in size without focal abnormality. Adrenals/Urinary Tract: Normal appearance of the adrenal glands. The right kidney appears normal. 1.3 cm left lower pole kidney cyst. Urinary bladder appears normal. Stomach/Bowel: Stomach is normal and nondistended. There is no bowel wall thickening, inflammation or distension. The appendix is abnormally thickened with enhancement and surrounding inflammation. Tiny appendicular lith is identified within the appendiceal lumen measuring 2 mm. Small volume of free fluid is identified within the right lower quadrant. Vascular/Lymphatic: Aortic atherosclerosis. No enlarged abdominal or pelvic lymph nodes. Reproductive: Status post hysterectomy. No adnexal masses. Other: No abdominal wall hernia or abnormality. Musculoskeletal: Degenerative disc disease identified within the lower lumbar spine at the L4-5 and L5-S1 level. IMPRESSION: 1. Examination is positive for acute appendicitis. No evidence for perforation or abscess. 2. Liver and left kidney cysts. 3. Aortic atherosclerosis, in addition to LAD and RCA coronary artery disease. Please note that although the presence of coronary artery calcium documents the presence of coronary artery disease, the severity of this disease and any potential stenosis cannot be assessed on this non-gated CT examination. Assessment for potential risk factor modification, dietary therapy or pharmacologic therapy may be warranted, if clinically indicated. Aortic Atherosclerosis (ICD10-I70.0). 4. Left chest wall pacemaker. Electronically Signed   By: Kerby Moors M.D.   On: 04/16/2020 10:23    ROS 10 point review of systems is negative except as listed above in HPI.   Physical  Exam Blood pressure (!) 146/93, pulse 61, temperature 99.1 F (37.3 C), temperature source Oral, resp. rate  16, height 5' 4.5" (1.638 m), weight 58.5 kg, SpO2 96 %. Constitutional: well-developed, well-nourished HEENT: pupils equal, round, reactive to light, 64mm b/l, bilateral lid ptosis, moist conjunctiva, external inspection of ears and nose normal, hearing intact Oropharynx: normal oropharyngeal mucosa, normal dentition Neck: no thyromegaly, trachea midline, no midline cervical tenderness to palpation Chest: breath sounds equal bilaterally, normal respiratory effort, no midline or lateral chest wall tenderness to palpation/deformity Abdomen: soft, NT, no bruising, no hepatosplenomegaly GU: normal female genitalia  Back: no wounds, no thoracic/lumbar spine tenderness to palpation, no thoracic/lumbar spine stepoffs Rectal: deferred Extremities: 2+ radial and pedal pulses bilaterally, motor and sensation intact to bilateral UE and LE, no peripheral edema MSK: normal gait/station, no clubbing/cyanosis of fingers/toes, normal ROM of all four extremities Skin: warm, dry, no rashes Psych: normal memory, normal mood/affect    Assessment/Plan: 37F with acute appendicitis. Given her cardiac history and pacemaker, we discussed options of non-surgical vs surgical management, with clear explanation that non-surgical management may fail and result in conversion to surgical management. The patient is electing for non-surgical management and will be admitted to outpatient for IV abx. Given the potential of failure of non-surgical management, will have the patient evaluated by cardiology for risk stratification and optimization as her last echo was performed in 2017. With no symptoms of nausea, I will order a clear liquid diet and will plan to advance diet and convert to oral antibiotics 5/16. If tolerating and her pain is controlled, she can be discharged with close follow up. We discussed a plan for outpatient colonoscopy at six weeks. She and her husband verbalized understanding and had an opportunity to have all  their questions answered.   Jesusita Oka, MD General and [facility]

## 2020-04-16 NOTE — Consult Note (Signed)
CARDIOLOGY CONSULT NOTE     Primary Care Physician: Tonia Ghent, MD Referring Physician:  Admit Date: 04/15/2020  Reason for consultation:  Preoperative assessment  [name] is a 74 y.o. female with a h/o sick sinus syndrome s/p PPM and HTN who now presents with RLQ pain and is felt to have acute appendicitis.  She was evaluated by Dr Bobbye Morton.  Though medical management is being attempted, cardiology is consulted in case surgery is required. She is well known to me from the office. She is active.   Today, she denies symptoms of palpitations, chest pain, shortness of breath, orthopnea, PND, lower extremity edema, dizziness, presyncope, syncope, or neurologic sequela. The patient is tolerating medications without difficulties and is otherwise without complaint today.   Past Medical History:  Diagnosis Date  . Adenomatous colon polyp 12/2006  . Allergic rhinitis   . Anemia   . Arthritis   . Bradycardia    s/p MDT PPM  . Depression   . Esophageal stricture   . GERD (gastroesophageal reflux disease)   . Hemorrhoids   . Hyperlipidemia   . Hypertension   . Inguinal hernia   . Osteopenia 12/2012   T score -1.7 FRAX 8.5%/1.1%  . PVC (premature ventricular contraction)   . Scleritis of right eye   . Skin cancer    Basal and squamous cell  . Sleep apnea    a. intolerant of CPAP  . Subdural hematoma ([place])    a. spontanous 09/2017 b. resolved without intervention  . Thyroid disease   . Vitreous detachment of both eyes    Past Surgical History:  Procedure Laterality Date  . ABDOMINAL HYSTERECTOMY    . BREAST SURGERY     Biopsy-benign  . CESAREAN SECTION    . DILATION AND CURETTAGE OF UTERUS    . ESOPHAGOGASTRODUODENOSCOPY     esophageal stretching  . HAND SURGERY  1992  . HERNIA REPAIR    . INGUINAL HERNIA REPAIR  02/12/1994   left inguinal hernia repair  . KNEE ARTHROSCOPY  2011  . OOPHORECTOMY  1995   LSO and RSO  . PACEMAKER GENERATOR CHANGE N/A 01/21/2015   Procedure: PACEMAKER GENERATOR CHANGE;  Surgeon: [name], MD;  Location: [facility] CATH LAB;  Service: Cardiovascular;  Laterality: N/A;  . PACEMAKER INSERTION  2006   Dr. Verlon Setting  . POLYPECTOMY    . Tear duct surgery  2010  . THUMB SURGERY    . THYROIDECTOMY  1979   RIGHT SIDE  . TOTAL KNEE ARTHROPLASTY Right 12/20/2014   Procedure: RIGHT TOTAL KNEE ARTHROPLASTY;  Surgeon: Vickey Huger, MD;  Location: [facility];  Service: Orthopedics;  Laterality: Right;    . acetaminophen  1,000 mg Oral Q6H  . diltiazem  120 mg Oral Daily  . docusate sodium  100 mg Oral BID  . enoxaparin (LOVENOX) injection  40 mg Subcutaneous Q24H  . famotidine  20 mg Oral BID  . ketorolac  15 mg Intravenous Q6H  . ondansetron (ZOFRAN) IV  4 mg Intravenous Once   . sodium chloride    . meropenem (MERREM) IV Stopped (04/16/20 1230)    Allergies  Allergen Reactions  . Hydrocodone     Increased HR  . Antihistamines, Loratadine-Type Other (See Comments)    Heart races/jittery  . Bisoprolol-Hydrochlorothiazide Other (See Comments)    Couldn't tolerate UNKNOWN REACTION  . Cephalexin Nausea And Vomiting  . Codeine Other (See Comments)    Increased heart rate, anxiety  . Demerol [Meperidine]  Other (See Comments)    Increased heart rate, anxiety  . Epinephrine Other (See Comments)    Increased heart rate  . Erythromycin Nausea And Vomiting  . Hyoscyamine Sulfate Other (See Comments)    Pt could not tolerate this med UNKNOWN reaction  . Inderal [Propranolol] Other (See Comments)    Extreme tiredness  . Lidocaine Other (See Comments)    Family hx of malignant hyperthermia  . Lipitor [Atorvastatin] Other (See Comments)    Muscle aches  . Losartan Potassium Other (See Comments)    Pt could not tolerate this med UNKNOWN REACTION  . Metoprolol Swelling  . Morphine Other (See Comments)    Nightmares, increased BP  . Nebivolol Other (See Comments)    angioedema  . Paxil [Paroxetine Hcl] Other (See Comments)     "drove her up the wall"  . Penicillins Nausea And Vomiting  . Prednisone Other (See Comments)    Tolerates low doses if absolutely necessary - makes her nervous, jittery  . Propranolol Hcl Other (See Comments)    Pt could not tolerate this med UNKNOWN REACTION  . Sertraline Hcl Other (See Comments)    Pt could not tolerate this med UNKNOWN REACTION  . Tetracycline Nausea And Vomiting  . Toprol Xl [Metoprolol Succinate] Other (See Comments)    Extreme tiredness  . Verapamil Other (See Comments)    Pt could not tolerate this med UNKNOWN REACTION  . Hydralazine     headaches  . Oxycodone-Acetaminophen Palpitations    Causes accelerated heart rate    Social History   Socioeconomic History  . Marital status: Married    Spouse name: Harrie Jeans  . Number of children: 1  . Years of education: Not on file  . Highest education level: Not on file  Occupational History  . Occupation: Retired    Fish farm manager: UNEMPLOYED  Tobacco Use  . Smoking status: Never Smoker  . Smokeless tobacco: Never Used  Substance and Sexual Activity  . Alcohol use: No    Alcohol/week: 0.0 standard drinks  . Drug use: No  . Sexual activity: Not Currently    Birth control/protection: Surgical, Post-menopausal    Comment: HYST-1st intercourse 74 yo-fewer than 5 partners  Other Topics Concern  . 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 Difficulty of Paying Living Expenses:   Food Insecurity:   . Worried About Charity fundraiser in the Last Year:   . Arboriculturist in the Last Year:   Transportation Needs:   . Film/video editor (Medical):   Mar[place] Kitchen Lack of Transportation (Non-Medical):   Physical Activity:   . Days of Exercise per Week:   . Minutes of Exercise per Session:   Stress:   . Feeling of Stress :   Social Connections:   .  Frequency of Communication with Friends and Family:   . Frequency of Social Gatherings with Friends and Family:   . Attends Religious Services:   . Active Member of Clubs or Organizations:   . Attends Archivist Meetings:   Marland Kitchen Marital Status:   Intimate Partner Violence:   . Fear of Current or Ex-Partner:   . Emotionally Abused:   [place] Kitchen Physically Abused:   . Sexually Abused:     Family History  Problem Relation Age of Onset  . Heart failure Sister   . Heart disease Sister   . Hypertension Sister   . Dementia Mother   . Hypertension Mother   . Hypertension Father   . Arthritis Father   . Breast cancer Maternal Aunt        Age 89's  . Diabetes Maternal Aunt   . Diabetes Maternal Uncle   . Ovarian cancer Maternal Aunt   . Colon cancer Neg Hx     ROS- All systems are reviewed and negative except as per the HPI above  Physical Exam: Telemetry: Vitals:   04/16/20 1345 04/16/20 1403 04/16/20 1627 04/16/20 1655  BP: (!) 129/94 (!) 152/80 (!) 146/76 (!) 162/83  Pulse: 83 62 (!) 59 65  Resp:  16 14 18   Temp:  98.4 F (36.9 C) 97.8 F (36.6 C) 98.3 F (36.8 C)  TempSrc:  Oral Oral Oral  SpO2: 96% 96% 97% 99%  Weight:      Height:        GEN- The patient is well appearing, alert and oriented x 3 today.   Head- normocephalic, atraumatic Eyes-  Sclera clear, conjunctiva pink Ears- hearing intact Oropharynx- clear Lungs-   normal work of breathing Heart- Regular rate and rhythm  GI- soft  Extremities- no clubbing, cyanosis, or edema MS- no significant deformity or atrophy Skin- no rash or lesion Psych- euthymic mood, full affect Neuro- strength and sensation are intact  EKG: 07/15/19- atrial paced rhythm, LAHB  Labs:   Lab Results  Component Value Date   WBC 9.5 04/15/2020   HGB 13.6 04/15/2020   HCT 39.5 04/15/2020   MCV 97.1 04/15/2020   PLT 188 04/15/2020     Echo: pending  ASSESSMENT AND PLAN:  1. Preoperative CV assessment No ischemic  symptoms despite good activity Echo pending If echo is low risk, no further CV testing is advised  2. Sick sinus syndrome Remotes are up to date Remote 02/03/20 is reviewed and reveals normal device function No further workup is planned  3. HTN Stable No change required today   [name], MD 04/16/2020  5:05 PM

## 2020-04-17 ENCOUNTER — Observation Stay (HOSPITAL_BASED_OUTPATIENT_CLINIC_OR_DEPARTMENT_OTHER): Payer: Medicare Other

## 2020-04-17 DIAGNOSIS — I361 Nonrheumatic tricuspid (valve) insufficiency: Secondary | ICD-10-CM

## 2020-04-17 DIAGNOSIS — K3580 Unspecified acute appendicitis: Secondary | ICD-10-CM | POA: Diagnosis not present

## 2020-04-17 DIAGNOSIS — R001 Bradycardia, unspecified: Secondary | ICD-10-CM | POA: Diagnosis not present

## 2020-04-17 LAB — ECHOCARDIOGRAM COMPLETE
Height: 64.5 in
Weight: 2064 oz

## 2020-04-17 NOTE — Progress Notes (Signed)
Pt tolerated full liquids for lunch, will advance to regular diet.

## 2020-04-17 NOTE — Progress Notes (Signed)
   Progress Note   Subjective   Doing well today, the patient denies CP or SOB.  No new concerns  Inpatient Medications    Scheduled Meds: . acetaminophen  1,000 mg Oral Q6H  . diltiazem  120 mg Oral Daily  . docusate sodium  100 mg Oral BID  . enoxaparin (LOVENOX) injection  40 mg Subcutaneous Q24H  . famotidine  20 mg Oral BID  . ondansetron (ZOFRAN) IV  4 mg Intravenous Once   Continuous Infusions: . sodium chloride Stopped (04/16/20 2248)  . meropenem (MERREM) IV 1 g (04/17/20 0607)   PRN Meds: [EXPIRED] ketorolac **FOLLOWED BY** ketorolac, LORazepam, ondansetron **OR** ondansetron (ZOFRAN) IV   Vital Signs    Vitals:   04/16/20 1403 04/16/20 1627 04/16/20 1655 04/16/20 2026  BP: (!) 152/80 (!) 146/76 (!) 162/83 137/76  Pulse: 62 (!) 59 65 63  Resp: 16 14 18 16   Temp: 98.4 F (36.9 C) 97.8 F (36.6 C) 98.3 F (36.8 C) (!) 97.4 F (36.3 C)  TempSrc: Oral Oral Oral Oral  SpO2: 96% 97% 99% 97%  Weight:      Height:        Intake/Output Summary (Last 24 hours) at 04/17/2020 1002 Last data filed at 04/17/2020 0900 Gross per 24 hour  Intake 1550.22 ml  Output 1 ml  Net 1549.22 ml   Filed Weights   04/15/20 1827  Weight: 58.5 kg      Physical Exam   GEN- The patient is well appearing, alert and oriented x 3 today.   Head- normocephalic, atraumatic Eyes-  Sclera clear, conjunctiva pink Ears- hearing intact Oropharynx- clear Neck- supple, Lungs-  normal work of breathing Heart- Regular rate and rhythm  GI- soft  Extremities- no clubbing, cyanosis, or edema  MS- no significant deformity or atrophy Skin- no rash or lesion Psych- euthymic mood, full affect Neuro- strength and sensation are intact   Labs    Chemistry Recent Labs  Lab 04/15/20 1838  NA 130*  K 4.0  CL 95*  CO2 24  GLUCOSE 118*  BUN 12  CREATININE 0.92  CALCIUM 8.7*  PROT 6.8  ALBUMIN 4.1  AST 16  ALT 17  ALKPHOS 50  BILITOT 0.8  GFRNONAA >60  GFRAA >60  ANIONGAP 11       Hematology Recent Labs  Lab 04/15/20 1838 04/16/20 1655  WBC 9.5 6.4  RBC 4.07 4.16  HGB 13.6 13.5  HCT 39.5 40.2  MCV 97.1 96.6  MCH 33.4 32.5  MCHC 34.4 33.6  RDW 12.0 11.9  PLT 188 182     Patient ID  74 yo WF with h/o sick sinus, s/p PPM and HTN consulted for possible preoperative clearance  Assessment & Plan    1.  preop assessment Awaiting echo If low risk, no further workup is planned  2 HTN Stable No change required today  3. Sick sinus Normal pacemaker function  Anticipate that cardiology will sign off if echo is low risk  [name] MD, [facility] 04/17/2020 10:02 AM

## 2020-04-17 NOTE — Care Management Obs Status (Signed)
[place] NOTIFICATION   Patient Details  Name: [name] MRN: [id number] Date of Birth: 15-Apr-1946   Medicare Observation Status Notification Given:  Yes    Claudie Leach, RN 04/17/2020, 4:56 PM

## 2020-04-17 NOTE — Progress Notes (Signed)
Patient ID: BLONNIE MASKE, female   DOB: 1946/03/09, 74 y.o.   MRN: [id number]   Acute Care Surgery Service Progress Note:    Chief Complaint/Subjective: No n/v Still with some lower abd pain Clears ok but doesn't like taste Had BM  Objective: Vital signs in last 24 hours: Temp:  [97.4 F (36.3 C)-98.4 F (36.9 C)] 97.4 F (36.3 C) (05/15 2026) Pulse Rate:  [58-83] 63 (05/15 2026) Resp:  [14-18] 16 (05/15 2026) BP: (129-162)/(74-94) 137/76 (05/15 2026) SpO2:  [95 %-100 %] 97 % (05/15 2026) Last BM Date: 04/14/20  Intake/Output from previous day: 05/15 0701 - 05/16 0700 In: 1550.2 [P.O.:960; I.V.:272.8; IV Piggyback:317.4] Out: -  Intake/Output this shift: Total I/O In: -  Out: 1 [Stool:1]  Lungs: cta, nonlabored  Cardiovascular: reg  Abd: soft, nd, some TTP in RLQ and a little L mid abd, no peritonitis/guarding  Extremities: no edema, +SCDs  Neuro: alert, nonfocal  Lab Results: CBC  Recent Labs    04/15/20 1838 04/16/20 1655  WBC 9.5 6.4  HGB 13.6 13.5  HCT 39.5 40.2  PLT 188 182   BMET Recent Labs    04/15/20 1838  NA 130*  K 4.0  CL 95*  CO2 24  GLUCOSE 118*  BUN 12  CREATININE 0.92  CALCIUM 8.7*   LFT Hepatic Function Latest Ref Rng & Units 04/15/2020 06/22/2019 03/31/2018  Total Protein 6.5 - 8.1 g/dL 6.8 7.0 7.0  Albumin 3.5 - 5.0 g/dL 4.1 4.5 4.2  AST 15 - 41 U/L _0 ALT 0 - 44 U/L _1 Alk Phosphatase 38 - 126 U/L 50 50 50  Total Bilirubin 0.3 - 1.2 mg/dL 0.8 0.5 0.6  Bilirubin, Direct <=0.2 mg/dL - - -   PT/INR No results for input(s): LABPROT, INR in the last 72 hours. ABG No results for input(s): PHART, HCO3 in the last 72 hours.  Invalid input(s): PCO2, PO2  Studies/Results:  Anti-infectives: Anti-infectives (From admission, onward)   Start     Dose/Rate Route Frequency Ordered Stop   04/16/20 1900  meropenem (MERREM) 1 g in sodium chloride 0.9 % 100 mL IVPB  Status:  Discontinued     1 g 200 mL/hr over 30  Minutes Intravenous Every 8 hours 04/16/20 1316 04/16/20 1317   04/16/20 1100  meropenem (MERREM) 1 g in sodium chloride 0.9 % 100 mL IVPB     1 g 200 mL/hr over 30 Minutes Intravenous Every 8 hours 04/16/20 1052        Medications: Scheduled Meds: . acetaminophen  1,000 mg Oral Q6H  . diltiazem  120 mg Oral Daily  . docusate sodium  100 mg Oral BID  . enoxaparin (LOVENOX) injection  40 mg Subcutaneous Q24H  . famotidine  20 mg Oral BID  . ondansetron (ZOFRAN) IV  4 mg Intravenous Once   Continuous Infusions: . sodium chloride Stopped (04/16/20 2248)  . meropenem (MERREM) IV 1 g (04/17/20 0607)   PRN Meds:.[EXPIRED] ketorolac **FOLLOWED BY** ketorolac, LORazepam, ondansetron **OR** ondansetron (ZOFRAN) IV  Assessment/Plan: Patient Active Problem List   Diagnosis Date Noted  . Acute appendicitis 04/16/2020  . HTN (hypertension) 06/11/2018  . Subdural hematoma ([place]) 09/26/2017  . Health care maintenance 05/10/2016  . History of thyroid disease 05/10/2016  . Osteoarthritis of neck 05/10/2016  . S/P total knee arthroplasty 12/20/2014  . Medicare annual wellness visit, subsequent 10/12/2014  . Advance care planning 10/12/2014  . Palpitations 09/23/2013  . Fatigue 08/07/2013  .  Alopecia 05/11/2013  . Benign hypertensive heart disease without heart failure 01/07/2013  . Pacemaker-Medtronic 10/08/2012  . Hiatal hernia 05/28/2012  . Esophageal stricture 05/28/2012  . Osteoporosis screening 01/16/2012  . Anxiety 01/16/2012  . Sick sinus syndrome ([place])   . Osteoarthritis of knee 04/19/2011  . Internal hemorrhoids 02/28/2011  . Constipation 02/28/2011  . GERD 06/28/2009  . Hypercholesterolemia 03/30/2008  . Obstructive sleep apnea 03/30/2008  . History of SCC (squamous cell carcinoma) of skin 03/30/2008   Acute appendicitis with appendicolith htn Sick sinus syndrome gerd  No fever. No wbc. Exam stable. Will cont with IV abx and adv diet and see how tolerates. Discussed  indications/criteria for discharge.   Appreciate card evaluation  vte prophylaxis - scds, lovenox  Disposition: given some persistent abd tenderness, I think we need to monitor to Monday and make sure tolerates diet. Adv diet as tolerated  LOS: 0 days    Leighton Ruff. Redmond Pulling, MD, FACS General, Bariatric, & Minimally Invasive Surgery [phone] [facility]

## 2020-04-17 NOTE — Progress Notes (Signed)
  Echocardiogram 2D Echocardiogram has been performed.  [name] F 04/17/2020, 8:51 AM

## 2020-04-18 DIAGNOSIS — I495 Sick sinus syndrome: Secondary | ICD-10-CM | POA: Diagnosis not present

## 2020-04-18 DIAGNOSIS — K3580 Unspecified acute appendicitis: Secondary | ICD-10-CM | POA: Diagnosis not present

## 2020-04-18 DIAGNOSIS — I10 Essential (primary) hypertension: Secondary | ICD-10-CM | POA: Diagnosis not present

## 2020-04-18 MED ORDER — SACCHAROMYCES BOULARDII 250 MG PO CAPS
250.0000 mg | ORAL_CAPSULE | Freq: Two times a day (BID) | ORAL | Status: DC
  Administered 2020-04-18: 250 mg via ORAL
  Filled 2020-04-18: qty 1

## 2020-04-18 MED ORDER — CIPROFLOXACIN HCL 500 MG PO TABS: 500.0000 mg | ORAL_TABLET | Freq: Two times a day (BID) | ORAL | 0 refills | Status: AC

## 2020-04-18 MED ORDER — METRONIDAZOLE 500 MG PO TABS: 500.0000 mg | ORAL_TABLET | Freq: Four times a day (QID) | ORAL | 0 refills | Status: AC

## 2020-04-18 MED ORDER — METRONIDAZOLE 500 MG PO TABS
500.0000 mg | ORAL_TABLET | Freq: Four times a day (QID) | ORAL | Status: DC
  Administered 2020-04-18: 500 mg via ORAL
  Filled 2020-04-18: qty 1

## 2020-04-18 MED ORDER — CIPROFLOXACIN HCL 500 MG PO TABS
500.0000 mg | ORAL_TABLET | Freq: Two times a day (BID) | ORAL | Status: DC
  Administered 2020-04-18: 500 mg via ORAL
  Filled 2020-04-18: qty 1

## 2020-04-18 MED ORDER — ONDANSETRON 4 MG PO TBDP: 4.0000 mg | ORAL_TABLET | Freq: Four times a day (QID) | ORAL | 0 refills | Status: DC | PRN

## 2020-04-18 NOTE — Discharge Summary (Signed)
[facility] Discharge Summary   Patient ID: [name] MRN: [id number] DOB/AGE: Jul 01, 1946 74 y.o.  Admit date: 04/15/2020 Discharge date: 04/18/2020  Discharge Diagnosis Patient Active Problem List   Diagnosis Date Noted  . Acute appendicitis 04/16/2020  . HTN (hypertension) 06/11/2018  . Subdural hematoma ([place]) 09/26/2017  . Health care maintenance 05/10/2016  . History of thyroid disease 05/10/2016  . Osteoarthritis of neck 05/10/2016  . S/P total knee arthroplasty 12/20/2014  . Medicare annual wellness visit, subsequent 10/12/2014  . Advance care planning 10/12/2014  . Palpitations 09/23/2013  . Fatigue 08/07/2013  . Alopecia 05/11/2013  . Benign hypertensive heart disease without heart failure 01/07/2013  . Pacemaker-Medtronic 10/08/2012  . Hiatal hernia 05/28/2012  . Esophageal stricture 05/28/2012  . Osteoporosis screening 01/16/2012  . Anxiety 01/16/2012  . Sick sinus syndrome ([place])   . Osteoarthritis of knee 04/19/2011  . Internal hemorrhoids 02/28/2011  . Constipation 02/28/2011  . GERD 06/28/2009  . Hypercholesterolemia 03/30/2008  . Obstructive sleep apnea 03/30/2008  . History of SCC (squamous cell carcinoma) of skin 03/30/2008    Consultants Cardiology   Imaging: ECHOCARDIOGRAM COMPLETE  Result Date: 04/17/2020    ECHOCARDIOGRAM REPORT   Patient Name:   [name] Date of Exam: 04/17/2020 Medical Rec #:  [id number]     Height:       64.5 in Accession #:    [id number]    Weight:       129.0 lb Date of Birth:  1946/01/14    BSA:          47.632 m Patient Age:    80 years      BP:           137/76 mmHg Patient Gender: F             HR:           62 bpm. Exam Location:  Inpatient Procedure: 2D Echo, Color Doppler and Cardiac Doppler Indications:    pre-op  History:        Patient has prior history of Echocardiogram examinations, most                 recent 05/18/2016. Appendicitis.  Sonographer:    Merrie Roof RDCS Referring Phys: [id number] [place]  1. Left ventricular ejection fraction, by estimation, is 60 to 65%. The left ventricle has normal function. The left ventricle has no regional wall motion abnormalities. Left ventricular diastolic parameters were normal.  2. Right ventricular systolic function is normal. The right ventricular size is normal. There is normal pulmonary artery systolic pressure.  3. The mitral valve is normal in structure. No evidence of mitral valve regurgitation. No evidence of mitral stenosis.  4. The aortic valve is normal in structure. Aortic valve regurgitation is not visualized. No aortic stenosis is present.  5. The inferior vena cava is normal in size with greater than 50% respiratory variability, suggesting right atrial pressure of 3 mmHg. FINDINGS  Left Ventricle: Left ventricular ejection fraction, by estimation, is 60 to 65%. The left ventricle has normal function. The left ventricle has no regional wall motion abnormalities. The left ventricular internal cavity size was normal in size. There is  no left ventricular hypertrophy. Left ventricular diastolic parameters were normal. Right Ventricle: Pacing wires in RA/RV. The right ventricular size is normal. No increase in right ventricular wall thickness. Right ventricular systolic function is normal. There is normal pulmonary artery systolic pressure. The tricuspid regurgitant velocity  is 2.30 m/s, and with an assumed right atrial pressure of 10 mmHg, the estimated right ventricular systolic pressure is [id number] mmHg. Left Atrium: Left atrial size was normal in size. Right Atrium: Right atrial size was normal in size. Pericardium: There is no evidence of pericardial effusion. Mitral Valve: The mitral valve is normal in structure. Normal mobility of the mitral valve leaflets. No evidence of mitral valve regurgitation. No evidence of mitral valve stenosis. Tricuspid Valve: The tricuspid valve is normal in structure. Tricuspid valve regurgitation is mild . No  evidence of tricuspid stenosis. Aortic Valve: The aortic valve is normal in structure. Aortic valve regurgitation is not visualized. No aortic stenosis is present. Pulmonic Valve: The pulmonic valve was normal in structure. Pulmonic valve regurgitation is not visualized. No evidence of pulmonic stenosis. Aorta: The aortic root is normal in size and structure. Venous: The inferior vena cava is normal in size with greater than 50% respiratory variability, suggesting right atrial pressure of 3 mmHg. IAS/Shunts: The interatrial septum was not well visualized.  LEFT VENTRICLE PLAX 2D LVIDd:         3.70 cm     Diastology LVIDs:         2.50 cm     LV e' lateral:   7.94 cm/s LV PW:         0.90 cm     LV E/e' lateral: 8.7 LV IVS:        0.90 cm     LV e' medial:    4.79 cm/s LVOT diam:     2.00 cm     LV E/e' medial:  14.4 LV SV:         98 LV SV Index:   60 LVOT Area:     3.14 cm  LV Volumes (MOD) LV vol d, MOD A4C: 75.7 ml LV vol s, MOD A4C: 31.0 ml LV SV MOD A4C:     75.7 ml RIGHT VENTRICLE RV Basal diam:  3.80 cm RV S prime:     11.20 cm/s TAPSE (M-mode): 2.7 cm LEFT ATRIUM             Index       RIGHT ATRIUM           Index LA diam:        3.60 cm 2.21 cm/m  RA Area:     20.70 cm LA Vol (A2C):   45.9 ml 28.12 ml/m RA Volume:   61.00 ml  37.37 ml/m LA Vol (A4C):   31.8 ml 19.48 ml/m LA Biplane Vol: 41.1 ml 25.18 ml/m  AORTIC VALVE LVOT Vmax:   130.00 cm/s LVOT Vmean:  82.200 cm/s LVOT VTI:    0.312 m  AORTA Ao Root diam: 3.40 cm Ao Asc diam:  3.10 cm MITRAL VALVE               TRICUSPID VALVE MV Area (PHT): 2.93 cm    TR Peak grad:   21.2 mmHg MV Decel Time: 259 msec    TR Vmax:        230.00 cm/s MV E velocity: 69.00 cm/s MV A velocity: 91.30 cm/s  SHUNTS MV E/A ratio:  0.76        Systemic VTI:  0.31 m                            Systemic Diam: 2.00 cm Jenkins Rouge MD Electronically signed by Jenkins Rouge MD Signature  Date/Time: 04/17/2020/10:07:05 AM    Final     Procedures None   HPI: 32F with  abdominal pain since 04/14/2020. She denies any associated symptoms of nausea, vomiting, anorexia, diarrhea. She describes the pain as crampy in nature and localizes it to the right lower quadrant. Her last BM was on 04/14/2020. She tried miralax at the direction of her gastroenterologist without relief, causing her to present to the ED. She does report that she was told her gynecologist removed her appendix at the time of her hysterectomy 20 years ago.  Hospital Course:    Workup showed Acute appendicitis with appendicolith (WBC 9.5). Given her cardiac history and pacemaker, operative vs non-operative mgmt was discussed with the patient and she chose a trial non-operative management. She was admitted to the hospital and started on IV antibiotics and clear liquid diet. Cardiology was consulted for perioperative cardiac evaluation and optimization, should non-operative treatment fail. Patients pain gradually improved and her diet was advanced as tolerated. On 04/18/20 the patients pain was significantly improved, was voiding well, tolerating diet, ambulating well, pain well controlled, vital signs stable, and felt stable for discharge home.  Patient will complete a total of 7 days of antibiotics. Patient will follow up in our office in 2 weeks and knows to call with questions or concerns.   Physical Exam: General:  Alert, NAD, pleasant, comfortable Lungs: cta, nonlabored Cardiovascular: reg Abd: soft, nd, mild tenderness to deep palpation RLQ, no peritonitis/guarding Extremities: no edema, +SCDs Neuro: alert, nonfocal  Allergies as of 04/18/2020      Reactions   Hydrocodone    Increased HR   Antihistamines, Loratadine-type Other (See Comments)   Heart races/jittery   Bisoprolol-hydrochlorothiazide Other (See Comments)   Couldn't tolerate UNKNOWN REACTION   Cephalexin Nausea And Vomiting   Codeine Other (See Comments)   Increased heart rate, anxiety   Demerol [meperidine] Other (See Comments)    Increased heart rate, anxiety   Epinephrine Other (See Comments)   Increased heart rate   Erythromycin Nausea And Vomiting   Hyoscyamine Sulfate Other (See Comments)   Pt could not tolerate this med UNKNOWN reaction   Inderal [propranolol] Other (See Comments)   Extreme tiredness   Lidocaine Other (See Comments)   Family hx of malignant hyperthermia   Lipitor [atorvastatin] Other (See Comments)   Muscle aches   Losartan Potassium Other (See Comments)   Pt could not tolerate this med UNKNOWN REACTION   Metoprolol Swelling   Morphine Other (See Comments)   Nightmares, increased BP   Nebivolol Other (See Comments)   angioedema   Paxil [paroxetine Hcl] Other (See Comments)   "drove her up the wall"   Penicillins Nausea And Vomiting   Prednisone Other (See Comments)   Tolerates low doses if absolutely necessary - makes her nervous, jittery   Propranolol Hcl Other (See Comments)   Pt could not tolerate this med UNKNOWN REACTION   Sertraline Hcl Other (See Comments)   Pt could not tolerate this med UNKNOWN REACTION   Tetracycline Nausea And Vomiting   Toprol Xl [metoprolol Succinate] Other (See Comments)   Extreme tiredness   Verapamil Other (See Comments)   Pt could not tolerate this med UNKNOWN REACTION   Hydralazine    headaches   Oxycodone-acetaminophen Palpitations   Causes accelerated heart rate      Medication List    TAKE these medications   acetaminophen 500 MG tablet Commonly known as: TYLENOL Take 1,000 mg by mouth every 6 (six) hours  as needed (pain).   ciprofloxacin 500 MG tablet Commonly known as: CIPRO Take 1 tablet (500 mg total) by mouth 2 (two) times daily for 4 days.   diltiazem 120 MG 24 hr capsule Commonly known as: Cartia XT Take 1 capsule (120 mg total) by mouth daily.   EQ Famotidine Max St 20 MG tablet Generic drug: famotidine Take 1 tablet by mouth twice daily   GenTeal 0.25-0.3 % Gel Generic drug:  Carboxymethylcell-Hypromellose Place 1 application into both eyes at bedtime.   LORazepam 1 MG tablet Commonly known as: ATIVAN TAKE 1 TABLET BY MOUTH EVERY 6 HOURS AS NEEDED   metroNIDAZOLE 0.75 % gel Commonly known as: METROGEL Apply 1 application topically as needed.   metroNIDAZOLE 500 MG tablet Commonly known as: FLAGYL Take 1 tablet (500 mg total) by mouth every 6 (six) hours for 4 days.   nitroGLYCERIN 0.4 MG SL tablet Commonly known as: NITROSTAT Place 1 tablet (0.4 mg total) under the tongue every 5 (five) minutes as needed. For up to 3 doses   ondansetron 4 MG disintegrating tablet Commonly known as: ZOFRAN-ODT Take 1 tablet (4 mg total) by mouth every 6 (six) hours as needed for nausea.   SYSTANE OP Place 1 drop into both eyes daily as needed (dry eyes).   terconazole 0.4 % vaginal cream Commonly known as: Terazol 7 Place 1 applicator vaginally at bedtime. For 7 nights      Follow-up Information    Jesusita Oka, MD Follow up.   Specialty: Surgery Why: please call to confirm appointment date/time. Contact information: [place] [address] [phone]           Signed: Obie Dredge, [facility] 04/18/2020, 4:07 PM

## 2020-04-18 NOTE — Progress Notes (Signed)
Patient discharged to home with instructions. 

## 2020-04-18 NOTE — Progress Notes (Addendum)
Progress Note   Subjective   Breathing well, no CP. Tolerating solid food w/out difficulty Looking forward to going home  Inpatient Medications    Scheduled Meds: . acetaminophen  1,000 mg Oral Q6H  . diltiazem  120 mg Oral Daily  . docusate sodium  100 mg Oral BID  . enoxaparin (LOVENOX) injection  40 mg Subcutaneous Q24H  . famotidine  20 mg Oral BID  . ondansetron (ZOFRAN) IV  4 mg Intravenous Once   Continuous Infusions: . sodium chloride 50 mL/hr at 04/17/20 1412  . meropenem (MERREM) IV 1 g (04/18/20 0604)   PRN Meds: [EXPIRED] ketorolac **FOLLOWED BY** ketorolac, LORazepam, ondansetron **OR** ondansetron (ZOFRAN) IV   Vital Signs    Vitals:   04/17/20 1233 04/17/20 1608 04/17/20 2010 04/18/20 0459  BP: (!) 153/92 (!) 143/86 129/77 (!) 153/80  Pulse: 73 80 61 (!) 56  Resp: 18 17 16 16   Temp: 98.3 F (36.8 C) 99.1 F (37.3 C) 97.7 F (36.5 C) 97.7 F (36.5 C)  TempSrc: Oral Oral    SpO2: 95% 97% 95% 95%  Weight:      Height:        Intake/Output Summary (Last 24 hours) at 04/18/2020 0922 Last data filed at 04/18/2020 0559 Gross per 24 hour  Intake 800 ml  Output --  Net 800 ml   Filed Weights   04/15/20 1827  Weight: 58.5 kg      Physical Exam   GEN: No acute distress.   Neck:  no JVD Cardiac: RRR, no murmurs, rubs, or gallops.  Respiratory: clear bilaterally GI: Soft, nontender, non-distended  MS: No edema; No deformity. Neuro:  Nonfocal  Psych: Normal affect   Labs    Chemistry Recent Labs  Lab 04/15/20 1838  NA 130*  K 4.0  CL 95*  CO2 24  GLUCOSE 118*  BUN 12  CREATININE 0.92  CALCIUM 8.7*  PROT 6.8  ALBUMIN 4.1  AST 16  ALT 17  ALKPHOS 50  BILITOT 0.8  GFRNONAA >60  GFRAA >60  ANIONGAP 11     Hematology Recent Labs  Lab 04/15/20 1838 04/16/20 1655  WBC 9.5 6.4  RBC 4.07 4.16  HGB 13.6 13.5  HCT 39.5 40.2  MCV 97.1 96.6  MCH 33.4 32.5  MCHC 34.4 33.6  RDW 12.0 11.9  PLT 188 182   ECHO:  04/17/2020 1. Left ventricular ejection fraction, by estimation, is 60 to 65%. The  left ventricle has normal function. The left ventricle has no regional  wall motion abnormalities. Left ventricular diastolic parameters were  normal.  2. Right ventricular systolic function is normal. The right ventricular  size is normal. There is normal pulmonary artery systolic pressure.  3. The mitral valve is normal in structure. No evidence of mitral valve  regurgitation. No evidence of mitral stenosis.  4. The aortic valve is normal in structure. Aortic valve regurgitation is  not visualized. No aortic stenosis is present.  5. The inferior vena cava is normal in size with greater than 50%  respiratory variability, suggesting right atrial pressure of 3 mmHg.    Patient ID   74 yo WF with h/o sick sinus, s/p PPM and HTN, consulted for possible preoperative clearance  Assessment & Plan    1.  preop assessment for acute appendicitis, medical therapy for now - echo results above, normal - no further cardiac eval needed, pt is at acceptable risk for surgery  2 HTN - BP 129-153/77-92 last 24 hr -  discuss w/ MD increasing Cardizem CD 120>>180 mg qd  3. Sick sinus - PPM functioning well, A pacing 75% of the time  [facility] will sign off.   Medication Recommendations:  Per MD Other recommendations (labs, testing, etc):  None Follow up as an outpatient:  As scheduled   Rosaria Ferries, PA-C 04/18/2020 9:30 AM   Attending Note:   The patient was seen and examined.  Agree with assessment and plan as noted above.  Changes made to the above note as needed.  Patient seen and independently examined with  Rosaria Ferries, PA .   We discussed all aspects of the encounter. I agree with the assessment and plan as stated above.  1.   HTN:   bp is ok.  May be a bit elevated due to stress of appendicitis.   We can titrate meds as OP.  2.   Pacer - pacer is functioning well    I have spent a  total of 40 minutes with patient reviewing hospital  notes , telemetry, EKGs, labs and examining patient as well as establishing an assessment and plan that was discussed with the patient. > 50% of time was spent in direct patient care.    Thayer Headings, Brooke Bonito., MD, [facility] 04/18/2020, 11:19 AM [address],  [place] Pager [phone]

## 2020-04-18 NOTE — Discharge Instructions (Signed)
Appendicitis, Adult  The appendix is a tube in the body that is shaped like a finger. It is attached to the large intestine. Appendicitis means that this tube is swollen (inflamed). If this is not treated, the tube can tear (rupture). This can lead to a life-threatening infection. This condition can also cause pus to build up in the appendix (abscess). What are the causes? This condition may be caused by something that blocks the appendix. These include:  A ball of poop (stool).  Lymph glands that are bigger than normal. Sometimes the cause is not known. What increases the risk? You are more likely to develop this condition if you are between 10 and 30 years of age. What are the signs or symptoms? Symptoms of this condition include:  Pain around the belly button (navel). ? The pain moves toward the lower right belly (abdomen). ? The pain can get worse with time. ? The pain can get worse if you cough. ? The pain can get worse if you move suddenly.  Tenderness in the lower right belly.  Feeling sick to your stomach (nauseous).  Throwing up (vomiting).  Not feeling hungry (loss of appetite).  A fever.  Having trouble pooping (constipation).  Watery poop (diarrhea).  Not feeling well. How is this treated? Most often, this condition is treated by taking out the appendix (appendectomy). There are two ways to do this:  Open surgery. For this method, the appendix is taken out through a large cut (incision). The cut is made in the lower right belly. This surgery may be used if: ? You have scars from another surgery. ? You have a bleeding condition. ? You are pregnant and will be having your baby soon. ? You have a condition that makes it hard to do the other type of surgery.  Laparoscopic surgery. For this method, the appendix is taken out through small cuts. Often, this surgery: ? Causes less pain. ? Causes fewer problems. ? Is easier to heal from. If your appendix tears and  pus forms:  A drain may be put into the sore. The drain will be used to get rid of the pus.  You may get an antibiotic medicine through an IV line.  Your appendix may or may not need to be taken out. Follow these instructions at home: If you had surgery, follow instructions from your doctor on how to care for yourself at home and how to take care of your cut from surgery. Medicines  Take over-the-counter and prescription medicines only as told by your doctor.  If you were prescribed an antibiotic medicine, take it as told by your doctor. Do not stop taking the antibiotic even if you start to feel better. Eating and drinking Follow instructions from your doctor about what you cannot eat or drink. You may go back to your diet slowly if:  You no longer feel sick to your stomach.  You have stopped throwing up. General instructions  Do not use any products that contain nicotine or tobacco, such as cigarettes, e-cigarettes, and chewing tobacco. If you need help quitting, ask your doctor.  Do not drive or use heavy machinery while taking prescription pain medicine.  Ask your doctor if the medicine you are taking can cause trouble pooping. You may need to take steps to prevent or treat trouble pooping: ? Drink enough fluid to keep your pee (urine) pale yellow. ? Take over-the-counter or prescription medicines. ? Eat foods that are high in fiber. These include beans,   whole grains, and fresh fruits and vegetables. ? Limit foods that are high in fat and sugar. These include fried or sweet foods.  Keep all follow-up visits as told by your doctor. This is important. Contact a doctor if:  There is pus, blood, or a lot of fluid coming from your cut or cuts from surgery.  You are sick to your stomach or you throw up. Get help right away if:  You have pain in your belly, and the pain is getting worse.  You have a fever.  You have chills.  You are very tired.  You have muscle  pain.  You are short of breath. Summary  Appendicitis is swelling of the appendix. The appendix is a tube that is shaped like a finger. It is joined to the large intestine.  This condition may be caused by something that blocks the appendix. This can lead to an infection.  This condition is usually treated by taking out the appendix. This information is not intended to replace advice given to you by your health care provider. Make sure you discuss any questions you have with your health care provider. Document Revised: 05/07/2018 Document Reviewed: 05/07/2018 Elsevier Patient Education  2020 [employer]  

## 2020-04-25 ENCOUNTER — Other Ambulatory Visit: Payer: Self-pay | Admitting: Family Medicine

## 2020-04-25 NOTE — Telephone Encounter (Signed)
Refill request Lorazepam Last refill 02/12/20 #120/1 Last office visit 06/28/20

## 2020-04-26 NOTE — Telephone Encounter (Signed)
Sent. Thanks.   

## 2020-04-29 ENCOUNTER — Ambulatory Visit: Payer: Medicare Other

## 2020-05-04 ENCOUNTER — Ambulatory Visit (INDEPENDENT_AMBULATORY_CARE_PROVIDER_SITE_OTHER): Payer: Medicare Other | Admitting: *Deleted

## 2020-05-04 DIAGNOSIS — I495 Sick sinus syndrome: Secondary | ICD-10-CM | POA: Diagnosis not present

## 2020-05-04 LAB — CUP PACEART REMOTE DEVICE CHECK
Battery Impedance: 274 Ohm
Battery Remaining Longevity: 119 mo
Battery Voltage: 2.78 V
Brady Statistic AP VP Percent: 0 %
Brady Statistic AP VS Percent: 74 %
Brady Statistic AS VP Percent: 0 %
Brady Statistic AS VS Percent: 26 %
Date Time Interrogation Session: 20210602160131
Implantable Lead Implant Date: 20060620
Implantable Lead Implant Date: 20060620
Implantable Lead Location: 753859
Implantable Lead Location: 753860
Implantable Lead Model: 5076
Implantable Lead Model: 5076
Implantable Pulse Generator Implant Date: 20160219
Lead Channel Impedance Value: 661 Ohm
Lead Channel Impedance Value: 819 Ohm
Lead Channel Pacing Threshold Amplitude: 0.5 V
Lead Channel Pacing Threshold Amplitude: 0.75 V
Lead Channel Pacing Threshold Pulse Width: 0.4 ms
Lead Channel Pacing Threshold Pulse Width: 0.4 ms
Lead Channel Setting Pacing Amplitude: 2 V
Lead Channel Setting Pacing Amplitude: 2.5 V
Lead Channel Setting Pacing Pulse Width: 0.4 ms
Lead Channel Setting Sensing Sensitivity: 5.6 mV

## 2020-05-06 NOTE — Progress Notes (Signed)
Remote pacemaker transmission.   

## 2020-05-12 ENCOUNTER — Telehealth: Payer: Self-pay | Admitting: Gastroenterology

## 2020-05-12 DIAGNOSIS — K3580 Unspecified acute appendicitis: Secondary | ICD-10-CM | POA: Diagnosis not present

## 2020-05-12 NOTE — Telephone Encounter (Signed)
Patient notified she will need an office visit to discuss.  Patient was recently admitted with an appendicitis and Dr. Samara Deist is requesting a colonoscopy.  Patient has been scheduled with Carl Best, RNP on 05/30/20 1:30

## 2020-05-12 NOTE — Telephone Encounter (Signed)
We received a referral for patient for a colonoscopy from [place], patient is not due for colonoscopy until 07/2022, called patient and she states that the doctor would like her to go ahead and do colonoscopy early. States she would just like her to get checked to make sure she does not have cancer. Patient would like to speak with you about why she wants to have it done sooner.

## 2020-05-13 ENCOUNTER — Ambulatory Visit
Admission: RE | Admit: 2020-05-13 | Discharge: 2020-05-13 | Disposition: A | Discharge status: Home / Self Care | Payer: Medicare Other | Source: Ambulatory Visit | Attending: Family Medicine | Admitting: Family Medicine

## 2020-05-13 ENCOUNTER — Other Ambulatory Visit: Payer: Self-pay

## 2020-05-13 DIAGNOSIS — Z1231 Encounter for screening mammogram for malignant neoplasm of breast: Secondary | ICD-10-CM

## 2020-05-30 ENCOUNTER — Ambulatory Visit: Payer: Medicare Other | Admitting: Nurse Practitioner

## 2020-05-30 ENCOUNTER — Encounter: Payer: Self-pay | Admitting: Nurse Practitioner

## 2020-05-30 VITALS — BP 142/78 | HR 71 | Ht 64.0 in | Wt 133.0 lb

## 2020-05-30 DIAGNOSIS — Z950 Presence of cardiac pacemaker: Secondary | ICD-10-CM

## 2020-05-30 DIAGNOSIS — K3580 Unspecified acute appendicitis: Secondary | ICD-10-CM

## 2020-05-30 DIAGNOSIS — Z86010 Personal history of colonic polyps: Secondary | ICD-10-CM

## 2020-05-30 MED ORDER — SUTAB 1479-225-188 MG PO TABS
1.0000 | ORAL_TABLET | Freq: Once | ORAL | 0 refills | Status: AC
Start: 2020-05-30 — End: 2020-05-30

## 2020-05-30 NOTE — Progress Notes (Signed)
05/30/2020 [name] [id number] 07-06-1946   Chief Complaint: Schedule a colonoscopy   History of Present Illness: [name] is a 74 year old female with a past medical history of depression, hypertension, sick sinus syndrome s/p pacemaker in 2006 with exchange in 2016, sleep apnea does not use cpap, GERD, esophageal stricture and colon polyps. Past hernia repair, C section and hysterectomy. She developed RLQ abdominal pain March or April 2021, gradual onset. She initially felt constipated so she took Miralax without improvement. Her RLQ abdominal pan worsened and she presented to [facility] ER on 04/15/2020 for further evaluation. Labs showed a sodium level of 130. WBC 9.5. Hg 13.6. An abdominal/pelvic CT was + for an acute appendicitis. She previously thought her appendix was removed at the time of her hysterectomy. She was evaluated by general surgeon Dr. Bobbye Morton, medical management was preferred by the patient. She was evaluated by cardiologist Dr. Rayann Heman  in case she proceeded to have an appendectomy. An ECHO was done which showed LV EF 60 -65%. No further cardiac evaluation was required. She was discharged home on 04/18/2020 to complete Cipro 500mg  one po bid x 4 days and Flagyl 500mg  one po Q 6 hrs x 4 days. She developed oral candidiasis which resolved after treatment with Nystatin. She was seen in the office by Dr. Bobbye Morton on 05/12/2020 for follow up. At that time, she reported having occasional brief RLQ pain. No significant pain. She was advised to schedule a colonoscopy with Dr Fuller Plan in 6 weeks to rule out any evidence of a cecal/colon malignancy in the setting of recent acute appendicitis. She is passing a normal formed brown BM daily. No rectal bleeding or melena. No other complaints today. Her husband is present.   History of colon polyps. She underwent a colonoscopy 07/23/2017 identified 2 sessile serrated polyps were removed.No family history of colon cancer.   Maternal uncle and  nephew with history of malignant hyperthermia. She denies having any personal history of malignant hyperthermia or other complication from anesthesia or sedation.    Allergic to Epinephrine with the reaction of tachycardia.   CBC Latest Ref Rng & Units 04/16/2020 04/15/2020 06/22/2019  WBC 4.0 - 10.5 K/uL 6.4 9.5 3.7(L)  Hemoglobin 12.0 - 15.0 g/dL 13.5 13.6 14.0  Hematocrit 36 - 46 % 40.2 39.5 41.1  Platelets 150 - 400 K/uL 182 188 191.0   CMP Latest Ref Rng & Units 04/15/2020 06/22/2019 03/31/2018  Glucose 70 - 99 mg/dL 118(H) 94 86  BUN 8 - 23 mg/dL 12 20 15   Creatinine 0.44 - 1.00 mg/dL 0.92 0.96 0.83  Sodium 135 - 145 mmol/L 130(L) 135 136  Potassium 3.5 - 5.1 mmol/L 4.0 4.2 4.5  Chloride 98 - 111 mmol/L 95(L) 101 99  CO2 22 - 32 mmol/L 24 26 32  Calcium 8.9 - 10.3 mg/dL 8.7(L) 8.9 9.3  Total Protein 6.5 - 8.1 g/dL 6.8 7.0 7.0  Total Bilirubin 0.3 - 1.2 mg/dL 0.8 0.5 0.6  Alkaline Phos 38 - 126 U/L 50 50 50  AST 15 - 41 U/L 16 16 13   ALT 0 - 44 U/L 17 13 9     Abdominal/Pelvic CT with contrast 04/16/2020:  Stomach/Bowel: Stomach is normal and nondistended. There is no bowel wall thickening, inflammation or distension. The appendix is abnormally thickened with enhancement and surrounding inflammation. Tiny appendicular lith is identified within the appendiceal lumen measuring 2 mm. Small volume of free fluid is identified within the right lower quadrant Impression:  1. Examination is positive for acute appendicitis. No evidence for perforation or abscess. 2. Liver and left kidney cysts. 3. Aortic atherosclerosis, in addition to LAD and RCA coronary artery disease. Please note that although the presence of coronary artery calcium documents the presence of coronary artery disease, the severity of this disease and any potential stenosis cannot be assessed on this non-gated CT examination. Assessment for potential risk factor modification, dietary therapy or pharmacologic  therapy may be warranted, if clinically indicated. Aortic Atherosclerosis (ICD10-I70.0). 4. Left chest wall pacemaker.  Colonoscopy 07/23/2017 by Dr. Fuller Plan: - The perianal and digital rectal examinations were normal. - Two sessile polyps were found in the transverse colon and cecum. The polyps were 5 to 7 mm in size. These polyps were removed with a cold snare. Resection and retrieval were complete. - The exam was otherwise without abnormality on direct and retroflexion views. - Internal hemorrhoids were found during retroflexion. The hemorrhoids were small and Grade I (internal hemorrhoids that do not prolapse).  Surgical [P], transverse and cecum, polyp (2) - SESSILE SERRATED POLYP (ONE FRAGMENT). - BENIGN COLONIC MUCOSA (ONE FRAGMENT). - NO DYSPLASIA OR MALIGNANCY  Scheduled Meds: Continuous Infusions: PRN Meds:. Allergies  Allergen Reactions  . Hydrocodone     Increased HR  . Antihistamines, Loratadine-Type Other (See Comments)    Heart races/jittery  . Bisoprolol-Hydrochlorothiazide Other (See Comments)    Couldn't tolerate UNKNOWN REACTION  . Cephalexin Nausea And Vomiting  . Codeine Other (See Comments)    Increased heart rate, anxiety  . Demerol [Meperidine] Other (See Comments)    Increased heart rate, anxiety  . Epinephrine Other (See Comments)    Increased heart rate  . Erythromycin Nausea And Vomiting  . Hyoscyamine Sulfate Other (See Comments)    Pt could not tolerate this med UNKNOWN reaction  . Inderal [Propranolol] Other (See Comments)    Extreme tiredness  . Lidocaine Other (See Comments)    Family hx of malignant hyperthermia  . Lipitor [Atorvastatin] Other (See Comments)    Muscle aches  . Losartan Potassium Other (See Comments)    Pt could not tolerate this med UNKNOWN REACTION  . Metoprolol Swelling  . Morphine Other (See Comments)    Nightmares, increased BP  . Nebivolol Other (See Comments)    angioedema  . Paxil [Paroxetine Hcl] Other  (See Comments)    "drove her up the wall"  . Penicillins Nausea And Vomiting  . Prednisone Other (See Comments)    Tolerates low doses if absolutely necessary - makes her nervous, jittery  . Propranolol Hcl Other (See Comments)    Pt could not tolerate this med UNKNOWN REACTION  . Sertraline Hcl Other (See Comments)    Pt could not tolerate this med UNKNOWN REACTION  . Tetracycline Nausea And Vomiting  . Toprol Xl [Metoprolol Succinate] Other (See Comments)    Extreme tiredness  . Verapamil Other (See Comments)    Pt could not tolerate this med UNKNOWN REACTION  . Hydralazine     headaches  . Oxycodone-Acetaminophen Palpitations    Causes accelerated heart rate     Current Medications, Allergies, Past Medical History, Past Surgical History, Family History and Social History were reviewed in [employer] record.   Physical Exam: BP (!) 142/78   Pulse 71   Ht 5\' 4"  (1.626 m)   Wt 133 lb (60.3 kg)   SpO2 98%   BMI 22.83 kg/m  General: Well developed 74 year old female in no acute distress.  Head: Normocephalic and atraumatic. Eyes: No scleral icterus. Conjunctiva pink . Ears: Normal auditory acuity. Mouth: Dentition intact. No ulcers or lesions.  Lungs: Clear throughout to auscultation. Chest: Pacemaker left subclavian. Heart: Regular rate and rhythm, no murmur. Abdomen: Soft, nondistended. Nontender. No masses or hepatomegaly. Normal bowel sounds x 4 quadrants.  Rectal: Deferred.  Musculoskeletal: Symmetrical with no gross deformities. Extremities: No edema. Neurological: Alert oriented x 4. No focal deficits.  Psychological: Alert and cooperative. Normal mood and affect  Assessment and Recommendations: 13. 74 year old female with acute appendicitis 5/14 treated with IV then po antibiotics for a total of 7 days, surgical intervention deferred. RLQ pain resolved. Patient to schedule a colonoscopy to rule out concomitant colon malignancy as requested  by surgeon Dr. Bobbye Morton.  -Colonoscopy benefits and risks discussed including risk with sedation, risk of bleeding, perforation and infection  -Patient to call our office if her abdominal pain recurs   2. History of sessile serrated colon polyps -See plan in # 1  3. History of sick sinus syndrome. Patient has a pacemaker.   4. Hyponatremia.  -Follow up with Dr. Damita Dunnings

## 2020-05-30 NOTE — Patient Instructions (Signed)
If you are age 73 or older, your body mass index should be between 23-30. Your Body mass index is 22.83 kg/m. If this is out of the aforementioned range listed, please consider follow up with your Primary Care Provider.  If you are age 53 or younger, your body mass index should be between 19-25. Your Body mass index is 22.83 kg/m. If this is out of the aformentioned range listed, please consider follow up with your Primary Care Provider.    We have sent the following medications to your pharmacy for you to pick up at your convenience:  sutab for colonoscopy prep  1. Call our office if your lower abdominal poain recurs. 2. Avoid constipation. 3. Take Miralax 1 capful mixed in 8 ounces of water at bed time for constipation as tolerated.  Due to recent changes in healthcare laws, you may see the results of your imaging and laboratory studies on MyChart before your provider has had a chance to review them.  We understand that in some cases there may be results that are confusing or concerning to you. Not all laboratory results come back in the same time frame and the provider may be waiting for multiple results in order to interpret others.  Please give [place] 48 hours in order for your provider to thoroughly review all the results before contacting the office for clarification of your results.

## 2020-05-31 NOTE — Progress Notes (Signed)
Reviewed and agree with management plan.  Shalom Mcguiness T. Javelle Donigan, MD FACG [facility]  

## 2020-06-01 ENCOUNTER — Telehealth: Payer: Self-pay | Admitting: Gastroenterology

## 2020-06-01 DIAGNOSIS — H34811 Central retinal vein occlusion, right eye, with macular edema: Secondary | ICD-10-CM | POA: Diagnosis not present

## 2020-06-01 NOTE — Telephone Encounter (Signed)
Patient's husband called states the prep medication requires prior auth please send alternative and advise

## 2020-06-02 NOTE — Telephone Encounter (Signed)
[place] and spoke with Aniceto Boss, they did not run the coupon code. The told the patient it would be 180.00 after she ran the code while we were on the phone it dropped to 40.00.

## 2020-06-02 NOTE — Telephone Encounter (Signed)
Called and spoke with [name], explained to her they ran the prescription for SUTAB again with the discount code and it is now 40.00. She was pleased and will pick up the rx.

## 2020-06-22 ENCOUNTER — Encounter: Payer: Self-pay | Admitting: Gastroenterology

## 2020-06-22 ENCOUNTER — Other Ambulatory Visit: Payer: Self-pay | Admitting: Family Medicine

## 2020-06-22 DIAGNOSIS — I119 Hypertensive heart disease without heart failure: Secondary | ICD-10-CM

## 2020-06-23 ENCOUNTER — Ambulatory Visit: Payer: Medicare Other

## 2020-06-23 ENCOUNTER — Other Ambulatory Visit: Payer: Medicare Other

## 2020-06-24 ENCOUNTER — Ambulatory Visit: Payer: Medicare Other

## 2020-06-28 ENCOUNTER — Other Ambulatory Visit (INDEPENDENT_AMBULATORY_CARE_PROVIDER_SITE_OTHER): Payer: Medicare Other

## 2020-06-28 ENCOUNTER — Other Ambulatory Visit: Payer: Self-pay

## 2020-06-28 DIAGNOSIS — I119 Hypertensive heart disease without heart failure: Secondary | ICD-10-CM

## 2020-06-28 LAB — COMPREHENSIVE METABOLIC PANEL
ALT: 13 U/L (ref 0–35)
AST: 16 U/L (ref 0–37)
Albumin: 4.3 g/dL (ref 3.5–5.2)
Alkaline Phosphatase: 49 U/L (ref 39–117)
BUN: 15 mg/dL (ref 6–23)
CO2: 28 mEq/L (ref 19–32)
Calcium: 9.2 mg/dL (ref 8.4–10.5)
Chloride: 99 mEq/L (ref 96–112)
Creatinine, Ser: 0.86 mg/dL (ref 0.40–1.20)
GFR: 64.75 mL/min (ref >60.00)
Glucose, Bld: 99 mg/dL (ref 70–99)
Potassium: 4.2 mEq/L (ref 3.5–5.1)
Sodium: 134 mEq/L — ABNORMAL LOW (ref 135–145)
Total Bilirubin: 0.6 mg/dL (ref 0.2–1.2)
Total Protein: 6.8 g/dL (ref 6.0–8.3)

## 2020-06-28 LAB — LIPID PANEL
Cholesterol: 249 mg/dL — ABNORMAL HIGH (ref 0–200)
HDL: 59.9 mg/dL (ref >39.00)
LDL Cholesterol: 166 mg/dL — ABNORMAL HIGH (ref 0–99)
NonHDL: 189.24
Total CHOL/HDL Ratio: 4
Triglycerides: 117 mg/dL (ref 0.0–149.0)
VLDL: 23.4 mg/dL (ref 0.0–40.0)

## 2020-06-28 LAB — TSH: TSH: 2.17 u[IU]/mL (ref 0.35–4.50)

## 2020-06-30 ENCOUNTER — Encounter: Payer: Medicare Other | Admitting: Family Medicine

## 2020-06-30 DIAGNOSIS — H35011 Changes in retinal vascular appearance, right eye: Secondary | ICD-10-CM | POA: Diagnosis not present

## 2020-06-30 DIAGNOSIS — H3582 Retinal ischemia: Secondary | ICD-10-CM | POA: Diagnosis not present

## 2020-06-30 DIAGNOSIS — H3561 Retinal hemorrhage, right eye: Secondary | ICD-10-CM | POA: Diagnosis not present

## 2020-06-30 DIAGNOSIS — H25811 Combined forms of age-related cataract, right eye: Secondary | ICD-10-CM | POA: Diagnosis not present

## 2020-06-30 DIAGNOSIS — H34811 Central retinal vein occlusion, right eye, with macular edema: Secondary | ICD-10-CM | POA: Diagnosis not present

## 2020-07-04 ENCOUNTER — Encounter: Payer: Self-pay | Admitting: Gastroenterology

## 2020-07-04 ENCOUNTER — Other Ambulatory Visit: Payer: Self-pay

## 2020-07-04 ENCOUNTER — Ambulatory Visit (AMBULATORY_SURGERY_CENTER): Payer: Medicare Other | Admitting: Gastroenterology

## 2020-07-04 VITALS — BP 146/83 | HR 55 | Temp 97.7°F | Resp 13 | Ht 64.0 in | Wt 133.0 lb

## 2020-07-04 DIAGNOSIS — R933 Abnormal findings on diagnostic imaging of other parts of digestive tract: Secondary | ICD-10-CM

## 2020-07-04 DIAGNOSIS — D125 Benign neoplasm of sigmoid colon: Secondary | ICD-10-CM

## 2020-07-04 DIAGNOSIS — K635 Polyp of colon: Secondary | ICD-10-CM

## 2020-07-04 DIAGNOSIS — Z86010 Personal history of colonic polyps: Secondary | ICD-10-CM

## 2020-07-04 DIAGNOSIS — K5730 Diverticulosis of large intestine without perforation or abscess without bleeding: Secondary | ICD-10-CM | POA: Diagnosis not present

## 2020-07-04 MED ORDER — SODIUM CHLORIDE 0.9 % IV SOLN: 500.0000 mL | Freq: Once | INTRAVENOUS | Status: DC

## 2020-07-04 NOTE — Progress Notes (Signed)
Pt's states no medical or surgical changes since previsit or office visit.  VS SP  

## 2020-07-04 NOTE — Progress Notes (Signed)
pt tolerated well. VSS. awake and to recovery. Report given to RN.  

## 2020-07-04 NOTE — Patient Instructions (Signed)
Impression/Recommendations:  Polyp handout given to patient. Diverticulosis handout given to patient. Hemorrhoid handout given to patient. High fiber diet handout given to patient.  Continue present medications. Await pathology results.  Repeat colonoscopy for surveillance.  Date to be determined after pathology results reviewed.  YOU HAD AN ENDOSCOPIC PROCEDURE TODAY AT [facility]:   Refer to the procedure report that was given to you for any specific questions about what was found during the examination.  If the procedure report does not answer your questions, please call your gastroenterologist to clarify.  If you requested that your care partner not be given the details of your procedure findings, then the procedure report has been included in a sealed envelope for you to review at your convenience later.  YOU SHOULD EXPECT: Some feelings of bloating in the abdomen. Passage of more gas than usual.  Walking can help get rid of the air that was put into your GI tract during the procedure and reduce the bloating. If you had a lower endoscopy (such as a colonoscopy or flexible sigmoidoscopy) you may notice spotting of blood in your stool or on the toilet paper. If you underwent a bowel prep for your procedure, you may not have a normal bowel movement for a few days.  Please Note:  You might notice some irritation and congestion in your nose or some drainage.  This is from the oxygen used during your procedure.  There is no need for concern and it should clear up in a day or so.  SYMPTOMS TO REPORT IMMEDIATELY:   Following lower endoscopy (colonoscopy or flexible sigmoidoscopy):  Excessive amounts of blood in the stool  Significant tenderness or worsening of abdominal pains  Swelling of the abdomen that is new, acute  Fever of 100F or higher For urgent or emergent issues, a gastroenterologist can be reached at any hour by calling [phone]. Do not use MyChart  messaging for urgent concerns.    DIET:  We do recommend a small meal at first, but then you may proceed to your regular diet.  Drink plenty of fluids but you should avoid alcoholic beverages for 24 hours.  ACTIVITY:  You should plan to take it easy for the rest of today and you should NOT DRIVE or use heavy machinery until tomorrow (because of the sedation medicines used during the test).    FOLLOW UP: Our staff will call the number listed on your records 48-72 hours following your procedure to check on you and address any questions or concerns that you may have regarding the information given to you following your procedure. If we do not reach you, we will leave a message.  We will attempt to reach you two times.  During this call, we will ask if you have developed any symptoms of COVID 19. If you develop any symptoms (ie: fever, flu-like symptoms, shortness of breath, cough etc.) before then, please call [phone].  If you test positive for Covid 19 in the 2 weeks post procedure, please call and report this information to [place].    If any biopsies were taken you will be contacted by phone or by letter within the next 1-3 weeks.  Please call us at [phone] if you have not heard about the biopsies in 3 weeks.    SIGNATURES/CONFIDENTIALITY: You and/or your care partner have signed paperwork which will be entered into your electronic medical record.  These signatures attest to the fact that that the information above on  your After Visit Summary has been reviewed and is understood.  Full responsibility of the confidentiality of this discharge information lies with you and/or your care-partner. 

## 2020-07-04 NOTE — Progress Notes (Signed)
Called to room to assist during endoscopic procedure.  Patient ID and intended procedure confirmed with present staff. Received instructions for my participation in the procedure from the performing physician.  

## 2020-07-04 NOTE — Op Note (Signed)
[place] Patient Name: [name] Procedure Date: 07/04/2020 2:06 PM MRN: [id number] Endoscopist: Ladene Artist , MD Age: 74 Referring MD:  Date of Birth: 06-12-1946 Gender: Female Account #: [id number] Procedure:                Colonoscopy Indications:              Abnormal CT of the GI tract (colon). Personal                            history of sessile serrated polyps and tubular                            adenomatous polyps. Medicines:                Monitored Anesthesia Care Procedure:                Pre-Anesthesia Assessment:                           - Prior to the procedure, a History and Physical                            was performed, and patient medications and                            allergies were reviewed. The patient's tolerance of                            previous anesthesia was also reviewed. The risks                            and benefits of the procedure and the sedation                            options and risks were discussed with the patient.                            All questions were answered, and informed consent                            was obtained. Prior Anticoagulants: The patient has                            taken no previous anticoagulant or antiplatelet                            agents. ASA Grade Assessment: III - A patient with                            severe systemic disease. After reviewing the risks                            and benefits, the patient was deemed in  satisfactory condition to undergo the procedure.                           After obtaining informed consent, the colonoscope                            was passed under direct vision. Throughout the                            procedure, the patient's blood pressure, pulse, and                            oxygen saturations were monitored continuously. The                            Colonoscope was introduced through the anus  and                            advanced to the the cecum, identified by                            appendiceal orifice and ileocecal valve. The                            ileocecal valve, appendiceal orifice, and rectum                            were photographed. The quality of the bowel                            preparation was excellent. The colonoscopy was                            performed without difficulty. The patient tolerated                            the procedure well. Scope In: 2:13:32 PM Scope Out: 2:27:16 PM Scope Withdrawal Time: 0 hours 10 minutes 29 seconds  Total Procedure Duration: 0 hours 13 minutes 44 seconds  Findings:                 The perianal and digital rectal examinations were                            normal.                           A 7 mm polyp was found in the sigmoid colon. The                            polyp was sessile. The polyp was removed with a                            cold snare. Resection and retrieval were complete.  A few small-mouthed diverticula were found in the                            left colon. There was no evidence of diverticular                            bleeding.                           Internal hemorrhoids were found during                            retroflexion. The hemorrhoids were small and Grade                            I (internal hemorrhoids that do not prolapse).                           The exam was otherwise without abnormality on                            direct and retroflexion views. Complications:            No immediate complications. Estimated blood loss:                            None. Estimated Blood Loss:     Estimated blood loss: none. Impression:               - One 7 mm polyp in the sigmoid colon, removed with                            a cold snare. Resected and retrieved.                           - Mild diverticulosis in the left colon.                            - Internal hemorrhoids.                           - The examination was otherwise normal on direct                            and retroflexion views. Recommendation:           - Repeat colonoscopy, likely 5 years, after studies                            are complete for surveillance based on pathology                            results.                           - Patient has a contact number available for  emergencies. The signs and symptoms of potential                            delayed complications were discussed with the                            patient. Return to normal activities tomorrow.                            Written discharge instructions were provided to the                            patient.                           - High fiber diet.                           - Continue present medications.                           - Await pathology results. Ladene Artist, MD 07/04/2020 2:31:56 PM This report has been signed electronically.

## 2020-07-06 ENCOUNTER — Other Ambulatory Visit: Payer: Self-pay | Admitting: Family Medicine

## 2020-07-06 ENCOUNTER — Telehealth: Payer: Self-pay | Admitting: *Deleted

## 2020-07-06 ENCOUNTER — Telehealth: Payer: Self-pay

## 2020-07-06 NOTE — Telephone Encounter (Signed)
Refill request Lorazepam Last office visit 06/29/19 Last refill 04/26/20 #120/1 Upcoming appointment 07/11/20

## 2020-07-06 NOTE — Telephone Encounter (Signed)
No answer

## 2020-07-06 NOTE — Telephone Encounter (Signed)
Sent. Thanks.   

## 2020-07-06 NOTE — Telephone Encounter (Signed)
Unable to leave message on f/u call 

## 2020-07-07 ENCOUNTER — Ambulatory Visit: Payer: Medicare Other

## 2020-07-08 ENCOUNTER — Ambulatory Visit: Payer: Medicare Other

## 2020-07-11 ENCOUNTER — Other Ambulatory Visit: Payer: Self-pay

## 2020-07-11 ENCOUNTER — Encounter: Payer: Self-pay | Admitting: Family Medicine

## 2020-07-11 ENCOUNTER — Ambulatory Visit (INDEPENDENT_AMBULATORY_CARE_PROVIDER_SITE_OTHER): Payer: Medicare Other | Admitting: Family Medicine

## 2020-07-11 VITALS — BP 148/76 | HR 58 | Temp 97.5°F | Ht 63.5 in | Wt 131.4 lb

## 2020-07-11 DIAGNOSIS — Z7189 Other specified counseling: Secondary | ICD-10-CM

## 2020-07-11 DIAGNOSIS — F419 Anxiety disorder, unspecified: Secondary | ICD-10-CM | POA: Diagnosis not present

## 2020-07-11 DIAGNOSIS — Z0000 Encounter for general adult medical examination without abnormal findings: Secondary | ICD-10-CM

## 2020-07-11 DIAGNOSIS — I119 Hypertensive heart disease without heart failure: Secondary | ICD-10-CM | POA: Diagnosis not present

## 2020-07-11 NOTE — Patient Instructions (Signed)
Don't change your meds for now.   Take care.  Glad to see you. Update me as needed.   

## 2020-07-11 NOTE — Progress Notes (Signed)
This visit occurred during the SARS-CoV-2 public health emergency.  Safety protocols were in place, including screening questions prior to the visit, additional usage of staff PPE, and extensive cleaning of exam room while observing appropriate contact time as indicated for disinfecting solutions.  Flu yearly Shingles discussed with patient about coverage. PNA up-to-date Tetanus up-to-date, 2019 covid 2021 Colonoscopy 2021 Breast cancer screening 2021 Bone density test discussed with patient.  She will consider.  She may be a candidate for IV bisphosphonate treatment if her DEXA were positive.  She will update me if she wants to go for bone density screening.  She wanted to defer 2021 given pandemic.   Advance directive-husband designated if patient were incapacitated.  Anxiety.  Taking BZD prn with some relief.  No ADE on med.  Pandemic stressors and isolation d/w pt.    She has been seeing the eye clinic in the meantime.    Hypertension:    Using medication without problems or lightheadedness: yes Chest pain with exertion:no Edema:no Short of breath:no  Meds, vitals, and allergies reviewed.   PMH and SH reviewed  ROS: Per HPI unless specifically indicated in ROS section   GEN: nad, alert and oriented HEENT: ncat NECK: supple w/o LA CV: rrr. PULM: ctab, no inc wob ABD: soft, +bs EXT: no edema SKIN: no acute rash

## 2020-07-13 NOTE — Assessment & Plan Note (Signed)
Advance directive- husband designated if patient were incapacitated.  

## 2020-07-13 NOTE — Assessment & Plan Note (Signed)
Reasonable control.  No adverse effect on medication.  Continue diltiazem.  Labs discussed with patient.  Update me as needed.  She agrees.

## 2020-07-13 NOTE — Assessment & Plan Note (Signed)
  Flu yearly Shingles discussed with patient about coverage. PNA up-to-date Tetanus up-to-date, 2019 covid 2021 Colonoscopy 2021 Breast cancer screening 2021 Bone density test discussed with patient.  She will consider.  She may be a candidate for IV bisphosphonate treatment if her DEXA were positive.  She will update me if she wants to go for bone density screening.  She wanted to defer 2021 given pandemic.   Advance directive-husband designated if patient were incapacitated.

## 2020-07-13 NOTE — Assessment & Plan Note (Signed)
Taking BZD prn with some relief.  No ADE on med.  Pandemic stressors and isolation d/w pt.

## 2020-07-15 ENCOUNTER — Ambulatory Visit (INDEPENDENT_AMBULATORY_CARE_PROVIDER_SITE_OTHER): Payer: Medicare Other

## 2020-07-15 ENCOUNTER — Other Ambulatory Visit: Payer: Self-pay

## 2020-07-15 DIAGNOSIS — Z0000 Encounter for general adult medical examination without abnormal findings: Secondary | ICD-10-CM

## 2020-07-15 NOTE — Progress Notes (Signed)
PCP notes:  Health Maintenance: Dexa- declined at this time. Still thinking about this.   Shingrix- due   Abnormal Screenings: none   Patient concerns: none   Nurse concerns: none   Next PCP appt.: none

## 2020-07-15 NOTE — Progress Notes (Signed)
Subjective:   [name] is a 74 y.o. female who presents for Medicare Annual (Subsequent) preventive examination.  Review of Systems: N/A     I connected with the patient today by telephone and verified that I am speaking with the correct person using two identifiers. Location patient: home Location nurse: work Persons participating in the virtual visit: patient, Marine scientist.   I discussed the limitations, risks, security and privacy concerns of performing an evaluation and management service by telephone and the availability of in person appointments. I also discussed with the patient that there may be a patient responsible charge related to this service. The patient expressed understanding and verbally consented to this telephonic visit.    Interactive audio and video telecommunications were attempted between this nurse and patient, however failed, due to patient having technical difficulties OR patient did not have access to video capability.  We continued and completed visit with audio only.     Cardiac Risk Factors include: advanced age (>45men, >47 women);hypertension;Other (see comment), Risk factor comments: hypercholesterolemia     Objective:    Today's Vitals   There is no height or weight on file to calculate BMI.  Advanced Directives 07/15/2020 04/16/2020 05/29/2018 09/26/2017 09/26/2017 05/13/2017 05/10/2016  Does Patient Have a Medical Advance Directive? Yes Yes Yes No No Yes Yes  Type of Paramedic of [place];Living will [place];Living will [place];Living will - - [place];Living will [place];Living will  Does patient want to make changes to medical advance directive? - No - Patient declined - - - - No - Patient declined  Copy of [place] in Chart? Yes - validated most recent copy scanned in chart (See row information) No - copy requested Yes - - No -  copy requested No - copy requested  Would patient like information on creating a medical advance directive? - - - No - Patient declined No - Patient declined - -    Current Medications (verified) Outpatient Encounter Medications as of 07/15/2020  Medication Sig  . acetaminophen (TYLENOL) 500 MG tablet Take 1,000 mg by mouth every 6 (six) hours as needed (pain).  . Carboxymethylcell-Hypromellose (GENTEAL) 0.25-0.3 % GEL Place 1 application into both eyes at bedtime.   Marland Kitchen diltiazem (CARTIA XT) 120 MG 24 hr capsule Take 1 capsule (120 mg total) by mouth daily.  . EQ FAMOTIDINE MAX ST 20 MG tablet Take 1 tablet by mouth twice daily  . LORazepam (ATIVAN) 1 MG tablet TAKE 1 TABLET BY MOUTH EVERY 6 HOURS AS NEEDED  . nitroGLYCERIN (NITROSTAT) 0.4 MG SL tablet Place 1 tablet (0.4 mg total) under the tongue every 5 (five) minutes as needed. For up to 3 doses  . Polyethyl Glycol-Propyl Glycol (SYSTANE OP) Place 1 drop into both eyes daily as needed (dry eyes).   Marland Kitchen terconazole (TERAZOL 7) 0.4 % vaginal cream Place 1 applicator vaginally at bedtime. For 7 nights   No facility-administered encounter medications on file as of 07/15/2020.    Allergies (verified) Hydrocodone; Antihistamines, loratadine-type; Bisoprolol-hydrochlorothiazide; Cephalexin; Codeine; Demerol [meperidine]; Epinephrine; Erythromycin; Hyoscyamine sulfate; Inderal [propranolol]; Lidocaine; Lipitor [atorvastatin]; Losartan potassium; Metoprolol; Morphine; Nebivolol; Paxil [paroxetine hcl]; Penicillins; Prednisone; Propranolol hcl; Sertraline hcl; Tetracycline; Toprol xl [metoprolol succinate]; Verapamil; Hydralazine; and Oxycodone-acetaminophen   History: Past Medical History:  Diagnosis Date  . Adenomatous colon polyp 12/2006  . Allergic rhinitis   . Anemia   . Appendicitis 04/2020  . Arthritis   .  Bradycardia    s/p MDT PPM  . Cataract    bilateral  . Depression   . Esophageal stricture   . GERD (gastroesophageal reflux  disease)   . Hemorrhoids   . Hyperlipidemia   . Hypertension   . Inguinal hernia   . Osteopenia 12/2012   T score -1.7 FRAX 8.5%/1.1%  . Osteoporosis   . PVC (premature ventricular contraction)   . Scleritis of right eye   . Skin cancer    Basal and squamous cell  . Sleep apnea    a. intolerant of CPAP  . Subdural hematoma ([place])    a. spontanous 09/2017 b. resolved without intervention  . Thyroid disease   . Vitreous detachment of both eyes    Past Surgical History:  Procedure Laterality Date  . ABDOMINAL HYSTERECTOMY    . BREAST SURGERY     Biopsy-benign  . CESAREAN SECTION    . COLONOSCOPY  2018  . DILATION AND CURETTAGE OF UTERUS    . ESOPHAGOGASTRODUODENOSCOPY     esophageal stretching  . HAND SURGERY  1992  . HERNIA REPAIR    . INGUINAL HERNIA REPAIR  02/12/1994   left inguinal hernia repair  . KNEE ARTHROSCOPY  2011  . OOPHORECTOMY  1995   LSO and RSO  . PACEMAKER GENERATOR CHANGE N/A 01/21/2015   Procedure: PACEMAKER GENERATOR CHANGE;  Surgeon: [name], MD;  [facility] CATH LAB;  Service: Cardiovascular;  Laterality: N/A;  . PACEMAKER INSERTION  2006   Dr. Verlon Setting  . POLYPECTOMY    . Tear duct surgery  2010  . THUMB SURGERY    . THYROIDECTOMY  1979   RIGHT SIDE  . TOTAL KNEE ARTHROPLASTY Right 12/20/2014   Procedure: RIGHT TOTAL KNEE ARTHROPLASTY;  Surgeon: Vickey Huger, MD;  Location: [facility];  Service: Orthopedics;  Laterality: Right;  . UPPER GASTROINTESTINAL ENDOSCOPY     unsure   Family History  Problem Relation Age of Onset  . Heart failure Sister   . Heart disease Sister   . Hypertension Sister   . Colon polyps Sister   . Dementia Mother   . Hypertension Mother   . Colon polyps Mother   . Hypertension Father   . Arthritis Father   . Breast cancer Maternal Aunt        Age 50's  . Diabetes Maternal Aunt   . Diabetes Maternal Uncle   . Ovarian cancer Maternal Aunt   . Colon cancer Neg Hx   . Esophageal cancer Neg Hx   . Stomach cancer Neg  Hx   . Rectal cancer Neg Hx    Social History   Socioeconomic History  . Marital status: Married    Spouse name: Harrie Jeans  . Number of children: 1  . Years of education: Not on file  . Highest education level: Not on file  Occupational History  . Occupation: Retired    Fish farm manager: UNEMPLOYED  Tobacco Use  . Smoking status: Never Smoker  . Smokeless tobacco: Never Used  Vaping Use  . Vaping Use: Never used  Substance and Sexual Activity  . Alcohol use: No    Alcohol/week: 0.0 standard drinks  . Drug use: No  . Sexual activity: Not Currently    Birth control/protection: Surgical, Post-menopausal    Comment: HYST-1st intercourse 74 yo-fewer than 5 partners  Other Topics Concern  . 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 Difficulty of Paying Living Expenses: Not hard at all  Food Insecurity: No Food Insecurity  . Worried About Charity fundraiser in the Last Year: Never true  . Ran Out of Food in the Last Year: Never true  Transportation Needs: No Transportation Needs  . Lack of Transportation (Medical): No  . Lack of Transportation (Non-Medical): No  Physical Activity: Inactive  . Days of Exercise per Week: 0 days  . Minutes of Exercise per Session: 0 min  Stress: Stress Concern Present  . Feeling of Stress : To some extent  Social Connections:   . Frequency of Communication with Friends and Family:   . Frequency of Social Gatherings with Friends and Family:   . Attends Religious Services:   . Active Member of Clubs or Organizations:   . Attends Archivist Meetings:   Marland Kitchen Marital Status:     Tobacco Counseling Counseling given: Not Answered   Clinical Intake:  Pre-visit preparation completed: Yes  Pain : No/denies pain     Nutritional Risks: None Diabetes: No  How  often do you need to have someone help you when you read instructions, pamphlets, or other written materials from your doctor or pharmacy?: 1 - Never What is the last grade level you completed in school?: 12th  Diabetic: No Nutrition Risk Assessment:  Has the patient had any N/V/D within the last 2 months?  No  Does the patient have any non-healing wounds?  No  Has the patient had any unintentional weight loss or weight gain?  No   Diabetes:  Is the patient diabetic?  No  If diabetic, was a CBG obtained today?  N/A Did the patient bring in their glucometer from home?  N/A How often do you monitor your CBG's? N/A.   Financial Strains and Diabetes Management:  Are you having any financial strains with the device, your supplies or your medication? N/A.  Does the patient want to be seen by Chronic Care Management for management of their diabetes?  N/A Would the patient like to be referred to a Nutritionist or for Diabetic Management?  N/A     Interpreter Needed?: No  Information entered by :: CJohnson, LPN   Activities of Daily Living In your present state of health, do you have any difficulty performing the following activities: 07/15/2020 04/16/2020  Hearing? N N  Vision? N N  Difficulty concentrating or making decisions? N N  Walking or climbing stairs? N N  Dressing or bathing? N N  Doing errands, shopping? N N  Preparing Food and eating ? N -  Using the Toilet? N -  In the past six months, have you accidently leaked urine? Y -  Comment wears poise pads -  Do you have problems with loss of bowel control? N -  Managing your Medications? N -  Managing your Finances? N -  Housekeeping or managing your Housekeeping? N -  Some recent data might be hidden    Patient Care Team: Tonia Ghent, MD as PCP - General (Family Medicine) Burnell Blanks, MD as PCP - Cardiology (Cardiology) Darlin Coco, MD (Cardiology) [name], MD as Attending Physician  (Cardiology) Deneise Lever, MD (Pulmonary Disease) Vickey Huger, MD (Orthopedic Surgery) Burnell Blanks, MD as Consulting Physician (Cardiology) Jarome Matin, MD as Consulting Physician (Dermatology) Phineas Real Belinda Block, MD (Inactive)  as Consulting Physician (Gynecology) Luberta Mutter, MD as Consulting Physician (Ophthalmology)  Indicate any recent Medical Services you may have received from other than [facility] providers in the past year (date may be approximate).     Assessment:   This is a routine wellness examination for Alysiana.  Hearing/Vision screen  Hearing Screening   125Hz  250Hz  500Hz  1000Hz  2000Hz  3000Hz  4000Hz  6000Hz  8000Hz   Right ear:           Left ear:           Vision Screening Comments: Patient gets annual eye exams   Dietary issues and exercise activities discussed: Current Exercise Habits: The patient does not participate in regular exercise at present, Exercise limited by: None identified  Goals    . Increase physical activity     When weather permits, I attempt to exercise for at least 15 min daily.     . Increase physical activity     Starting 05/30/2018, I will begin riding recumbent bike for 10-15 minutes 4-5 days per week.     . Patient Stated     07/15/2020, I will maintain and continue medications as prescribed.       Depression Screen PHQ 2/9 Scores 07/15/2020 07/11/2020 06/29/2019 05/29/2018 05/13/2017 05/10/2016 05/10/2016  PHQ - 2 Score 4 2 0 2 0 0 0  PHQ- 9 Score 4 - - 5 - - -    Fall Risk Fall Risk  07/15/2020 07/11/2020 06/29/2019 05/29/2018 05/13/2017  Falls in the past year? 0 0 0 No No  Number falls in past yr: 0 - - - -  Injury with Fall? 0 - - - -  Risk for fall due to : Medication side effect - - - -  Follow up Falls evaluation completed;Falls prevention discussed - - - -    Any stairs in or around the home? Yes  If so, are there any without handrails? No  Home free of loose throw rugs in walkways, pet beds, electrical cords, etc? Yes    Adequate lighting in your home to reduce risk of falls? Yes   ASSISTIVE DEVICES UTILIZED TO PREVENT FALLS:  Life alert? No  Use of a cane, walker or w/c? No  Grab bars in the bathroom? No  Shower chair or bench in shower? No  Elevated toilet seat or a handicapped toilet? No   TIMED UP AND GO:  Was the test performed? N/A, telephonic visit .    Cognitive Function: MMSE - Mini Mental State Exam 07/15/2020 05/29/2018 05/13/2017 05/10/2016  Orientation to time 5 5 5 5   Orientation to Place 5 5 5 5   Registration 3 3 3 3   Attention/ Calculation 5 0 0 0  Recall 3 3 3 3   Language- name 2 objects - 0 0 0  Language- repeat 1 1 1 1   Language- follow 3 step command - 3 3 3   Language- read & follow direction - 0 0 0  Write a sentence - 0 0 0  Copy design - 0 0 0  Total score - 20 20 20   Mini Cog  Mini-Cog screen was completed. Maximum score is 22. A value of 0 denotes this part of the MMSE was not completed or the patient failed this part of the Mini-Cog screening.       Immunizations Immunization History  Administered Date(s) Administered  . Fluad Quad(high Dose 65+) 09/03/2019  . Influenza Split 08/21/2012  . Influenza,inj,Quad PF,6+ Mos 09/11/2013, 08/25/2014, 09/05/2015, 09/03/2016, 09/03/2017, 08/28/2018  . PFIZER  SARS-COV-2 Vaccination 01/01/2020, 01/23/2020  . Pneumococcal Conjugate-13 09/05/2015  . Pneumococcal Polysaccharide-23 10/11/2014  . Tdap 07/04/2007, 07/31/2018  . Zoster 06/04/2012    TDAP status: Up to date Flu Vaccine status: due will be available in the office at the end of August Pneumococcal vaccine status: Up to date Covid-19 vaccine status: Completed vaccines  Qualifies for Shingles Vaccine? Yes   Zostavax completed Yes   Shingrix Completed?: No.    Education has been provided regarding the importance of this vaccine. Patient has been advised to call insurance company to determine out of pocket expense if they have not yet received this vaccine. Advised  may also receive vaccine at local pharmacy or Health Dept. Verbalized acceptance and understanding.  Screening Tests Health Maintenance  Topic Date Due  . INFLUENZA VACCINE  07/03/2020  . MAMMOGRAM  05/13/2022  . COLONOSCOPY  07/04/2025  . TETANUS/TDAP  07/31/2028  . DEXA SCAN  Completed  . COVID-19 Vaccine  Completed  . Hepatitis C Screening  Completed  . PNA vac Low Risk Adult  Completed    Health Maintenance  Health Maintenance Due  Topic Date Due  . INFLUENZA VACCINE  07/03/2020    Colorectal cancer screening: Completed 07/04/2020. Repeat every 5 years Mammogram status: Completed 05/13/2020. Repeat every year Bone Density status:declined at this time. Still thinking about this.   Lung Cancer Screening: (Low Dose CT Chest recommended if Age 73-80 years, 30 pack-year currently smoking OR have quit w/in 15years.) does not qualify.    Additional Screening:  Hepatitis C Screening: does qualify; Completed 05/10/2016  Vision Screening: Recommended annual ophthalmology exams for early detection of glaucoma and other disorders of the eye. Is the patient up to date with their annual eye exam?  Yes  Who is the provider or what is the name of the office in which the patient attends annual eye exams? Dr. Luberta Mutter If pt is not established with a provider, would they like to be referred to a provider to establish care? No .   Dental Screening: Recommended annual dental exams for proper oral hygiene  Community Resource Referral / Chronic Care Management: CRR required this visit?  No   CCM required this visit?  No      Plan:     I have personally reviewed and noted the following in the patient's chart:   . Medical and social history . Use of alcohol, tobacco or illicit drugs  . Current medications and supplements . Functional ability and status . Nutritional status . Physical activity . Advanced directives . List of other physicians . Hospitalizations, surgeries, and  ER visits in previous 12 months . Vitals . Screenings to include cognitive, depression, and falls . Referrals and appointments  In addition, I have reviewed and discussed with patient certain preventive protocols, quality metrics, and best practice recommendations. A written personalized care plan for preventive services as well as general preventive health recommendations were provided to patient.   Due to this being a telephonic visit, the after visit summary with patients personalized plan was offered to patient via mail or my-chart.  Patient preferred to pick up at office at next visit.   Andrez Grime, LPN   [id number]

## 2020-07-15 NOTE — Patient Instructions (Signed)
[name] , Thank you for taking time to come for your Medicare Wellness Visit. I appreciate your ongoing commitment to your health goals. Please review the following plan we discussed and let me know if I can assist you in the future.   Screening recommendations/referrals: Colonoscopy: Up to date, completed 07/04/2020, due 07/2025 Mammogram: Up to date, completed 05/13/2020, due 05/2021 Bone Density: declined at this time Recommended yearly ophthalmology/optometry visit for glaucoma screening and checkup Recommended yearly dental visit for hygiene and checkup  Vaccinations: Influenza vaccine: due, will be available at the office at the end of August Pneumococcal vaccine: Completed series Tdap vaccine: Up to date, completed 07/31/2018, due 07/2028 Shingles vaccine: due, check with your insurance regarding coverage   Covid-19:Completed series  Advanced directives: copy in chart  Conditions/risks identified: hypertension, hypercholesterolemia  Next appointment: Follow up in one year for your annual wellness visit    Preventive Care 73 Years and Older, Female Preventive care refers to lifestyle choices and visits with your health care provider that can promote health and wellness. What does preventive care include?  A yearly physical exam. This is also called an annual well check.  Dental exams once or twice a year.  Routine eye exams. Ask your health care provider how often you should have your eyes checked.  Personal lifestyle choices, including:  Daily care of your teeth and gums.  Regular physical activity.  Eating a healthy diet.  Avoiding tobacco and drug use.  Limiting alcohol use.  Practicing safe sex.  Taking low-dose aspirin every day.  Taking vitamin and mineral supplements as recommended by your health care provider. What happens during an annual well check? The services and screenings done by your health care provider during your annual well check will depend  on your age, overall health, lifestyle risk factors, and family history of disease. Counseling  Your health care provider may ask you questions about your:  Alcohol use.  Tobacco use.  Drug use.  Emotional well-being.  Home and relationship well-being.  Sexual activity.  Eating habits.  History of falls.  Memory and ability to understand (cognition).  Work and work Statistician.  Reproductive health. Screening  You may have the following tests or measurements:  Height, weight, and BMI.  Blood pressure.  Lipid and cholesterol levels. These may be checked every 5 years, or more frequently if you are over 39 years old.  Skin check.  Lung cancer screening. You may have this screening every year starting at age 73 if you have a 30-pack-year history of smoking and currently smoke or have quit within the past 15 years.  Fecal occult blood test (FOBT) of the stool. You may have this test every year starting at age 73.  Flexible sigmoidoscopy or colonoscopy. You may have a sigmoidoscopy every 5 years or a colonoscopy every 10 years starting at age 50.  Hepatitis C blood test.  Hepatitis B blood test.  Sexually transmitted disease (STD) testing.  Diabetes screening. This is done by checking your blood sugar (glucose) after you have not eaten for a while (fasting). You may have this done every 1-3 years.  Bone density scan. This is done to screen for osteoporosis. You may have this done starting at age 25.  Mammogram. This may be done every 1-2 years. Talk to your health care provider about how often you should have regular mammograms. Talk with your health care provider about your test results, treatment options, and if necessary, the need for more tests. Vaccines  Your health care provider may recommend certain vaccines, such as:  Influenza vaccine. This is recommended every year.  Tetanus, diphtheria, and acellular pertussis (Tdap, Td) vaccine. You may need a Td  booster every 10 years.  Zoster vaccine. You may need this after age 61.  Pneumococcal 13-valent conjugate (PCV13) vaccine. One dose is recommended after age 65.  Pneumococcal polysaccharide (PPSV23) vaccine. One dose is recommended after age 71. Talk to your health care provider about which screenings and vaccines you need and how often you need them. This information is not intended to replace advice given to you by your health care provider. Make sure you discuss any questions you have with your health care provider. Document Released: 12/16/2015 Document Revised: 08/08/2016 Document Reviewed: 09/20/2015 Elsevier Interactive Patient Education  2017 [place] Prevention in the Home Falls can cause injuries. They can happen to people of all ages. There are many things you can do to make your home safe and to help prevent falls. What can I do on the outside of my home?  Regularly fix the edges of walkways and driveways and fix any cracks.  Remove anything that might make you trip as you walk through a door, such as a raised step or threshold.  Trim any bushes or trees on the path to your home.  Use bright outdoor lighting.  Clear any walking paths of anything that might make someone trip, such as rocks or tools.  Regularly check to see if handrails are loose or broken. Make sure that both sides of any steps have handrails.  Any raised decks and porches should have guardrails on the edges.  Have any leaves, snow, or ice cleared regularly.  Use sand or salt on walking paths during winter.  Clean up any spills in your garage right away. This includes oil or grease spills. What can I do in the bathroom?  Use night lights.  Install grab bars by the toilet and in the tub and shower. Do not use towel bars as grab bars.  Use non-skid mats or decals in the tub or shower.  If you need to sit down in the shower, use a plastic, non-slip stool.  Keep the floor dry. Clean up  any water that spills on the floor as soon as it happens.  Remove soap buildup in the tub or shower regularly.  Attach bath mats securely with double-sided non-slip rug tape.  Do not have throw rugs and other things on the floor that can make you trip. What can I do in the bedroom?  Use night lights.  Make sure that you have a light by your bed that is easy to reach.  Do not use any sheets or blankets that are too big for your bed. They should not hang down onto the floor.  Have a firm chair that has side arms. You can use this for support while you get dressed.  Do not have throw rugs and other things on the floor that can make you trip. What can I do in the kitchen?  Clean up any spills right away.  Avoid walking on wet floors.  Keep items that you use a lot in easy-to-reach places.  If you need to reach something above you, use a strong step stool that has a grab bar.  Keep electrical cords out of the way.  Do not use floor polish or wax that makes floors slippery. If you must use wax, use non-skid floor wax.  Do  not have throw rugs and other things on the floor that can make you trip. What can I do with my stairs?  Do not leave any items on the stairs.  Make sure that there are handrails on both sides of the stairs and use them. Fix handrails that are broken or loose. Make sure that handrails are as long as the stairways.  Check any carpeting to make sure that it is firmly attached to the stairs. Fix any carpet that is loose or worn.  Avoid having throw rugs at the top or bottom of the stairs. If you do have throw rugs, attach them to the floor with carpet tape.  Make sure that you have a light switch at the top of the stairs and the bottom of the stairs. If you do not have them, ask someone to add them for you. What else can I do to help prevent falls?  Wear shoes that:  Do not have high heels.  Have rubber bottoms.  Are comfortable and fit you well.  Are  closed at the toe. Do not wear sandals.  If you use a stepladder:  Make sure that it is fully opened. Do not climb a closed stepladder.  Make sure that both sides of the stepladder are locked into place.  Ask someone to hold it for you, if possible.  Clearly mark and make sure that you can see:  Any grab bars or handrails.  First and last steps.  Where the edge of each step is.  Use tools that help you move around (mobility aids) if they are needed. These include:  Canes.  Walkers.  Scooters.  Crutches.  Turn on the lights when you go into a dark area. Replace any light bulbs as soon as they burn out.  Set up your furniture so you have a clear path. Avoid moving your furniture around.  If any of your floors are uneven, fix them.  If there are any pets around you, be aware of where they are.  Review your medicines with your doctor. Some medicines can make you feel dizzy. This can increase your chance of falling. Ask your doctor what other things that you can do to help prevent falls. This information is not intended to replace advice given to you by your health care provider. Make sure you discuss any questions you have with your health care provider. Document Released: 09/15/2009 Document Revised: 04/26/2016 Document Reviewed: 12/24/2014 Elsevier Interactive Patient Education  2017 [employer].

## 2020-07-21 ENCOUNTER — Encounter: Payer: Self-pay | Admitting: Gastroenterology

## 2020-07-26 ENCOUNTER — Other Ambulatory Visit: Payer: Self-pay | Admitting: Family Medicine

## 2020-07-28 NOTE — Progress Notes (Signed)
Electrophysiology Office Note Date: 07/29/2020  ID:  [name], [name] [birth year], MRN [id number]  PCP: Tonia Ghent, MD Primary Cardiologist: Angelena Form Electrophysiologist: Allred  CC: Pacemaker follow-up  [name] is a 74 y.o. female seen today for Dr Rayann Heman.  She presents today for routine electrophysiology followup.  Since last being seen in our clinic, the patient reports doing very well.  She denies chest pain, palpitations, dyspnea, PND, orthopnea, nausea, vomiting, dizziness, syncope, edema, weight gain, or early satiety.      Past Medical History:  Diagnosis Date  . Adenomatous colon polyp 12/2006  . Allergic rhinitis   . Anemia   . Appendicitis 04/2020  . Arthritis   . Bradycardia    s/p MDT PPM  . Cataract    bilateral  . Depression   . Esophageal stricture   . GERD (gastroesophageal reflux disease)   . Hemorrhoids   . Hyperlipidemia   . Hypertension   . Inguinal hernia   . Osteopenia 12/2012   T score -1.7 FRAX 8.5%/1.1%  . Osteoporosis   . PVC (premature ventricular contraction)   . Scleritis of right eye   . Skin cancer    Basal and squamous cell  . Sleep apnea    a. intolerant of CPAP  . Subdural hematoma ([place])    a. spontanous 09/2017 b. resolved without intervention  . Thyroid disease   . Vitreous detachment of both eyes    Past Surgical History:  Procedure Laterality Date  . ABDOMINAL HYSTERECTOMY    . BREAST SURGERY     Biopsy-benign  . CESAREAN SECTION    . COLONOSCOPY  2018  . DILATION AND CURETTAGE OF UTERUS    . ESOPHAGOGASTRODUODENOSCOPY     esophageal stretching  . HAND SURGERY  1992  . HERNIA REPAIR    . INGUINAL HERNIA REPAIR  02/12/1994   left inguinal hernia repair  . KNEE ARTHROSCOPY  2011  . OOPHORECTOMY  1995   LSO and RSO  . PACEMAKER GENERATOR CHANGE N/A 01/21/2015   Procedure: PACEMAKER GENERATOR CHANGE;  Surgeon: [name], MD;  Location: [facility] CATH LAB;  Service: Cardiovascular;  Laterality: N/A;  .  PACEMAKER INSERTION  2006   Dr. Verlon Setting  . POLYPECTOMY    . Tear duct surgery  2010  . THUMB SURGERY    . THYROIDECTOMY  1979   RIGHT SIDE  . TOTAL KNEE ARTHROPLASTY Right 12/20/2014   Procedure: RIGHT TOTAL KNEE ARTHROPLASTY;  Surgeon: Vickey Huger, MD;  Location: [facility];  Service: Orthopedics;  Laterality: Right;  . UPPER GASTROINTESTINAL ENDOSCOPY     unsure    Current Outpatient Medications  Medication Sig Dispense Refill  . acetaminophen (TYLENOL) 500 MG tablet Take 1,000 mg by mouth every 6 (six) hours as needed (pain).    . Carboxymethylcell-Hypromellose (GENTEAL) 0.25-0.3 % GEL Place 1 application into both eyes at bedtime.     Marland Kitchen diltiazem (CARTIA XT) 120 MG 24 hr capsule Take 1 capsule (120 mg total) by mouth daily. 90 capsule 2  . famotidine (PEPCID) 20 MG tablet Take 1 tablet by mouth twice daily 180 tablet 3  . LORazepam (ATIVAN) 1 MG tablet TAKE 1 TABLET BY MOUTH EVERY 6 HOURS AS NEEDED 120 tablet 1  . nitroGLYCERIN (NITROSTAT) 0.4 MG SL tablet Place 1 tablet (0.4 mg total) under the tongue every 5 (five) minutes as needed. For up to 3 doses 25 tablet 6  . Polyethyl Glycol-Propyl Glycol (SYSTANE OP) Place 1 drop into  both eyes daily as needed (dry eyes).     Marland Kitchen terconazole (TERAZOL 7) 0.4 % vaginal cream Place 1 applicator vaginally at bedtime. For 7 nights 45 g 4   No current facility-administered medications for this visit.    Allergies:   Hydrocodone; Antihistamines, loratadine-type; Bisoprolol-hydrochlorothiazide; Cephalexin; Codeine; Demerol [meperidine]; Epinephrine; Erythromycin; Hyoscyamine sulfate; Inderal [propranolol]; Lidocaine; Lipitor [atorvastatin]; Losartan potassium; Metoprolol; Morphine; Nebivolol; Paxil [paroxetine hcl]; Penicillins; Prednisone; Propranolol hcl; Sertraline hcl; Tetracycline; Toprol xl [metoprolol succinate]; Verapamil; Hydralazine; and Oxycodone-acetaminophen   Social History: Social History   Socioeconomic History  . Marital status:  Married    Spouse name: Harrie Jeans  . Number of children: 1  . Years of education: Not on file  . Highest education level: Not on file  Occupational History  . Occupation: Retired    Fish farm manager: UNEMPLOYED  Tobacco Use  . Smoking status: Never Smoker  . Smokeless tobacco: Never Used  Vaping Use  . Vaping Use: Never used  Substance and Sexual Activity  . Alcohol use: No    Alcohol/week: 0.0 standard drinks  . Drug use: No  . Sexual activity: Not Currently    Birth control/protection: Surgical, Post-menopausal    Comment: HYST-1st intercourse 74 yo-fewer than 5 partners  Other Topics Concern  . 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 Difficulty of Paying Living Expenses: Not hard at all  Food Insecurity: No Food Insecurity  . Worried About Charity fundraiser in the Last Year: Never true  . Ran Out of Food in the Last Year: Never true  Transportation Needs: No Transportation Needs  . Lack of Transportation (Medical): No  . Lack of Transportation (Non-Medical): No  Physical Activity: Inactive  . Days of Exercise per Week: 0 days  . Minutes of Exercise per Session: 0 min  Stress: Stress Concern Present  . Feeling of Stress : To some extent  Social Connections:   . Frequency of Communication with Friends and Family: Not on file  . Frequency of Social Gatherings with Friends and Family: Not on file  . Attends Religious Services: Not on file  . Active Member of Clubs or Organizations: Not on file  . Attends Archivist Meetings: Not on file  . Marital Status: Not on file  Intimate Partner Violence: Not At Risk  . Fear of Current or Ex-Partner: No  . Emotionally Abused: No  . Physically Abused: No  . Sexually Abused: No    Family History: Family History  Problem Relation Age of Onset  .  Heart failure Sister   . Heart disease Sister   . Hypertension Sister   . Colon polyps Sister   . Dementia Mother   . Hypertension Mother   . Colon polyps Mother   . Hypertension Father   . Arthritis Father   . Breast cancer Maternal Aunt        Age 45's  . Diabetes Maternal Aunt   . Diabetes Maternal Uncle   . Ovarian cancer Maternal Aunt   . Colon cancer Neg Hx   . Esophageal cancer Neg Hx   . Stomach cancer Neg Hx   . Rectal cancer Neg Hx      Review of Systems: All other systems reviewed and are otherwise negative except as  noted above.   Physical Exam: VS:  BP (!) 158/96   Pulse 61   Ht 5\' 4"  (1.626 m)   Wt 133 lb 9.6 oz (60.6 kg)   SpO2 97%   BMI 22.93 kg/m  , BMI Body mass index is 22.93 kg/m.  GEN- The patient is well appearing, alert and oriented x 3 today.   HEENT: normocephalic, atraumatic; sclera clear, conjunctiva pink; hearing intact; oropharynx clear; neck supple  Lungs- Clear to ausculation bilaterally, normal work of breathing.  No wheezes, rales, rhonchi Heart- Regular rate and rhythm, no murmurs, rubs or gallops  GI- soft, non-tender, non-distended, bowel sounds present  Extremities- no clubbing, cyanosis, or edema  MS- no significant deformity or atrophy Skin- warm and dry, no rash or lesion; PPM pocket well healed Psych- euthymic mood, full affect Neuro- strength and sensation are intact  PPM Interrogation- reviewed in detail today,  See PACEART report  EKG:  EKG is ordered today. EKG today demonstrates atrial pacing, intrinsic ventricular conduction   Recent Labs: 04/16/2020: Hemoglobin 13.5; Platelets 182 06/28/2020: ALT 13; BUN 15; Creatinine, Ser 0.86; Potassium 4.2; Sodium 134; TSH 2.17   Wt Readings from Last 3 Encounters:  07/29/20 133 lb 9.6 oz (60.6 kg)  07/11/20 131 lb 6 oz (59.6 kg)  07/04/20 133 lb (60.3 kg)     Other studies Reviewed: Additional studies/ records that were reviewed today include: Dr Angelena Form and Dr Jackalyn Lombard  office notes      Assessment and Plan:  1.  Symptomatic sinus bradycardia Normal PPM function See Pace Art report No changes today  2.  HTN Stable No change required today  3.  Atrial tachycardia No significant recurrence by device interrogation  4.  Prior SDH Per patient, not a candidate for [facility]    Current medicines are reviewed at length with the patient today.   The patient does not have concerns regarding her medicines.  The following changes were made today:  none  Labs/ tests ordered today include: none No orders of the defined types were placed in this encounter.    Disposition:   Follow up with [facility], me in 1 year     Signed, Chanetta Marshall, NP 07/29/2020 12:32 PM  [place] [id number] [phone] (office) [phone] (fax)

## 2020-07-29 ENCOUNTER — Ambulatory Visit (INDEPENDENT_AMBULATORY_CARE_PROVIDER_SITE_OTHER): Payer: Medicare Other | Admitting: Nurse Practitioner

## 2020-07-29 ENCOUNTER — Encounter: Payer: Self-pay | Admitting: Nurse Practitioner

## 2020-07-29 ENCOUNTER — Other Ambulatory Visit: Payer: Self-pay

## 2020-07-29 VITALS — BP 158/96 | HR 61 | Ht 64.0 in | Wt 133.6 lb

## 2020-07-29 DIAGNOSIS — I471 Supraventricular tachycardia: Secondary | ICD-10-CM | POA: Diagnosis not present

## 2020-07-29 DIAGNOSIS — I10 Essential (primary) hypertension: Secondary | ICD-10-CM | POA: Diagnosis not present

## 2020-07-29 DIAGNOSIS — I495 Sick sinus syndrome: Secondary | ICD-10-CM | POA: Diagnosis not present

## 2020-07-29 LAB — CUP PACEART INCLINIC DEVICE CHECK
Battery Impedance: 298 Ohm
Battery Remaining Longevity: 116 mo
Battery Voltage: 2.78 V
Brady Statistic AP VP Percent: 0 %
Brady Statistic AP VS Percent: 74 %
Brady Statistic AS VP Percent: 0 %
Brady Statistic AS VS Percent: 26 %
Date Time Interrogation Session: 20210827123141
Implantable Lead Implant Date: 20060620
Implantable Lead Implant Date: 20060620
Implantable Lead Location: 753859
Implantable Lead Location: 753860
Implantable Lead Model: 5076
Implantable Lead Model: 5076
Implantable Pulse Generator Implant Date: 20160219
Lead Channel Impedance Value: 645 Ohm
Lead Channel Impedance Value: 773 Ohm
Lead Channel Pacing Threshold Amplitude: 0.625 V
Lead Channel Pacing Threshold Amplitude: 0.75 V
Lead Channel Pacing Threshold Pulse Width: 0.4 ms
Lead Channel Pacing Threshold Pulse Width: 0.4 ms
Lead Channel Sensing Intrinsic Amplitude: 1.4 mV
Lead Channel Sensing Intrinsic Amplitude: 22.4 mV
Lead Channel Setting Pacing Amplitude: 2 V
Lead Channel Setting Pacing Amplitude: 2.5 V
Lead Channel Setting Pacing Pulse Width: 0.4 ms
Lead Channel Setting Sensing Sensitivity: 5.6 mV

## 2020-07-29 NOTE — Patient Instructions (Signed)
Medication Instructions:  *If you need a refill on your cardiac medications before your next appointment, please call your pharmacy*  Follow-Up: At [facility], you and your health needs are our priority.  As part of our continuing mission to provide you with exceptional heart care, we have created designated Provider Care Teams.  These Care Teams include your primary Cardiologist (physician) and Advanced Practice Providers (APPs -  Physician Assistants and Nurse Practitioners) who all work together to provide you with the care you need, when you need it.  We recommend signing up for the patient portal called "MyChart".  Sign up information is provided on this After Visit Summary.  MyChart is used to connect with patients for Virtual Visits (Telemedicine).  Patients are able to view lab/test results, encounter notes, upcoming appointments, etc.  Non-urgent messages can be sent to your provider as well.   To learn more about what you can do with MyChart, go to [email or web address].    Your next appointment:   Your physician wants you to follow-up in: 1 YEAR with Chanetta Marshall, NP. You will receive a reminder letter in the mail two months in advance. If you don't receive a letter, please call our office to schedule the follow-up appointment.  Remote monitoring is used to monitor your Pacemaker from home. This monitoring reduces the number of office visits required to check your device to one time per year. It allows [place] to keep an eye on the functioning of your device to ensure it is working properly. You are scheduled for a device check from home on 08/03/20. You may send your transmission at any time that day. If you have a wireless device, the transmission will be sent automatically. After your physician reviews your transmission, you will receive a postcard with your next transmission date.  The format for your next appointment:   In Person with Chanetta Marshall, NP

## 2020-08-03 ENCOUNTER — Ambulatory Visit (INDEPENDENT_AMBULATORY_CARE_PROVIDER_SITE_OTHER): Payer: Medicare Other | Admitting: *Deleted

## 2020-08-03 DIAGNOSIS — I495 Sick sinus syndrome: Secondary | ICD-10-CM | POA: Diagnosis not present

## 2020-08-03 DIAGNOSIS — H34811 Central retinal vein occlusion, right eye, with macular edema: Secondary | ICD-10-CM | POA: Diagnosis not present

## 2020-08-03 LAB — CUP PACEART REMOTE DEVICE CHECK
Battery Impedance: 299 Ohm
Battery Remaining Longevity: 115 mo
Battery Voltage: 2.78 V
Brady Statistic AP VP Percent: 0 %
Brady Statistic AP VS Percent: 83 %
Brady Statistic AS VP Percent: 0 %
Brady Statistic AS VS Percent: 17 %
Date Time Interrogation Session: 20210901100006
Implantable Lead Implant Date: 20060620
Implantable Lead Implant Date: 20060620
Implantable Lead Location: 753859
Implantable Lead Location: 753860
Implantable Lead Model: 5076
Implantable Lead Model: 5076
Implantable Pulse Generator Implant Date: 20160219
Lead Channel Impedance Value: 659 Ohm
Lead Channel Impedance Value: 773 Ohm
Lead Channel Pacing Threshold Amplitude: 0.5 V
Lead Channel Pacing Threshold Amplitude: 0.75 V
Lead Channel Pacing Threshold Pulse Width: 0.4 ms
Lead Channel Pacing Threshold Pulse Width: 0.4 ms
Lead Channel Setting Pacing Amplitude: 2 V
Lead Channel Setting Pacing Amplitude: 2.5 V
Lead Channel Setting Pacing Pulse Width: 0.4 ms
Lead Channel Setting Sensing Sensitivity: 5.6 mV

## 2020-08-05 NOTE — Progress Notes (Signed)
Remote pacemaker transmission.   

## 2020-08-24 DIAGNOSIS — H3582 Retinal ischemia: Secondary | ICD-10-CM | POA: Diagnosis not present

## 2020-08-24 DIAGNOSIS — D225 Melanocytic nevi of trunk: Secondary | ICD-10-CM | POA: Diagnosis not present

## 2020-08-24 DIAGNOSIS — L820 Inflamed seborrheic keratosis: Secondary | ICD-10-CM | POA: Diagnosis not present

## 2020-08-24 DIAGNOSIS — Z85828 Personal history of other malignant neoplasm of skin: Secondary | ICD-10-CM | POA: Diagnosis not present

## 2020-08-24 DIAGNOSIS — L570 Actinic keratosis: Secondary | ICD-10-CM | POA: Diagnosis not present

## 2020-08-24 DIAGNOSIS — L304 Erythema intertrigo: Secondary | ICD-10-CM | POA: Diagnosis not present

## 2020-08-24 DIAGNOSIS — H34811 Central retinal vein occlusion, right eye, with macular edema: Secondary | ICD-10-CM | POA: Diagnosis not present

## 2020-08-24 DIAGNOSIS — L814 Other melanin hyperpigmentation: Secondary | ICD-10-CM | POA: Diagnosis not present

## 2020-08-24 DIAGNOSIS — D2261 Melanocytic nevi of right upper limb, including shoulder: Secondary | ICD-10-CM | POA: Diagnosis not present

## 2020-08-24 DIAGNOSIS — H15091 Other scleritis, right eye: Secondary | ICD-10-CM | POA: Diagnosis not present

## 2020-08-24 DIAGNOSIS — H5711 Ocular pain, right eye: Secondary | ICD-10-CM | POA: Diagnosis not present

## 2020-08-24 DIAGNOSIS — H3561 Retinal hemorrhage, right eye: Secondary | ICD-10-CM | POA: Diagnosis not present

## 2020-09-01 ENCOUNTER — Ambulatory Visit (INDEPENDENT_AMBULATORY_CARE_PROVIDER_SITE_OTHER): Payer: Medicare Other

## 2020-09-01 ENCOUNTER — Other Ambulatory Visit: Payer: Self-pay

## 2020-09-01 DIAGNOSIS — Z23 Encounter for immunization: Secondary | ICD-10-CM | POA: Diagnosis not present

## 2020-09-13 ENCOUNTER — Other Ambulatory Visit: Payer: Self-pay | Admitting: Family Medicine

## 2020-09-13 NOTE — Telephone Encounter (Signed)
Refill request Lorazepam Last refill  07/06/20 #120/1 Last office visit 07/11/20

## 2020-09-13 NOTE — Telephone Encounter (Signed)
Sent. Thanks.   

## 2020-09-23 DIAGNOSIS — K37 Unspecified appendicitis: Secondary | ICD-10-CM | POA: Diagnosis not present

## 2020-09-26 ENCOUNTER — Other Ambulatory Visit (HOSPITAL_BASED_OUTPATIENT_CLINIC_OR_DEPARTMENT_OTHER): Payer: Self-pay | Admitting: Internal Medicine

## 2020-09-26 ENCOUNTER — Ambulatory Visit: Payer: Medicare Other | Attending: Internal Medicine

## 2020-09-26 ENCOUNTER — Telehealth: Payer: Self-pay | Admitting: *Deleted

## 2020-09-26 DIAGNOSIS — Z23 Encounter for immunization: Secondary | ICD-10-CM

## 2020-09-26 NOTE — Progress Notes (Signed)
   Covid-19 Vaccination Clinic  Name:  [name]    MRN: [id number] DOB: 01-Sep-1946  09/26/2020  [name] was observed post Covid-19 immunization for 15 minutes without incident. She was provided with Vaccine Information Sheet and instruction to access the V-Safe system. Vaccinated by [facility].  [name] was instructed to call 911 with any severe reactions post vaccine: Marland Kitchen Difficulty breathing  . Swelling of face and throat  . A fast heartbeat  . A bad rash all over body  . Dizziness and weakness

## 2020-09-26 NOTE — Telephone Encounter (Signed)
   [facility] Pre-operative Risk Assessment    HEARTCARE STAFF: - Please ensure there is not already an duplicate clearance open for this procedure. - Under Visit Info/Reason for Call, type in Other and utilize the format Clearance MM/DD/YY or Clearance TBD. Do not use dashes or single digits. - If request is for dental extraction, please clarify the # of teeth to be extracted.  Request for surgical clearance:  1. What type of surgery is being performed? LAP APPENDECTOMY    2. When is this surgery scheduled? TBD   3. What type of clearance is required (medical clearance vs. Pharmacy clearance to hold med vs. Both)? MEDICAL  4. Are there any medications that need to be held prior to surgery and how long? NONE LISTED    5. Practice name and name of physician performing surgery? [facility]; DR. Reather Laurence   6. What is the office phone number? [phone]   7.   What is the office fax number? [place], CMA  8.   Anesthesia type (None, local, MAC, general) ? GENERAL   Julaine Hua 09/26/2020, 2:14 PM  _________________________________________________________________   (provider comments below)

## 2020-09-26 NOTE — Telephone Encounter (Signed)
Left message to call back and ask to speak with the pre-op team.

## 2020-09-27 NOTE — Telephone Encounter (Signed)
   Primary Cardiologist: Lauree Chandler, MD  Chart reviewed as part of pre-operative protocol coverage. Patient was last seen by Chanetta Marshall, NP, on 07/29/2020 at which time she was doing well from a cardiac standpoint. Patient was contacted today for further pre-op evaluation. She states she has done well since last office visit. She has history of PVC and atrial tachycardia and will occasional note a skipped beat or very short episodes of tachypalpitations that last only a few seconds a time. No prolonged palpitations. No chest pain, shortness of breath, orthopnea, syncope. Given past medical history and time since last visit, based on ACC/AHA guidelines, [name] would be at acceptable risk for the planned procedure without further cardiovascular testing.   The patient was advised that if she develops new symptoms prior to surgery to contact our office to arrange for a follow-up visit, and she verbalized understanding.  I will route this recommendation to the requesting party via Epic fax function and remove from pre-op pool.  Please call with questions.  Darreld Mclean, PA-C 09/27/2020, 10:44 AM

## 2020-09-27 NOTE — Telephone Encounter (Signed)
Patient is returning call.  °

## 2020-09-30 MED FILL — PFIZER-BIONTECH COVID-19 VA: 30 | 1 days supply | Qty: 0 | Fill #0

## 2020-10-03 ENCOUNTER — Ambulatory Visit: Payer: Self-pay | Admitting: Surgery

## 2020-10-11 DIAGNOSIS — H2513 Age-related nuclear cataract, bilateral: Secondary | ICD-10-CM | POA: Diagnosis not present

## 2020-10-11 DIAGNOSIS — H524 Presbyopia: Secondary | ICD-10-CM | POA: Diagnosis not present

## 2020-10-11 DIAGNOSIS — H349 Unspecified retinal vascular occlusion: Secondary | ICD-10-CM | POA: Diagnosis not present

## 2020-10-12 DIAGNOSIS — H34811 Central retinal vein occlusion, right eye, with macular edema: Secondary | ICD-10-CM | POA: Diagnosis not present

## 2020-11-02 ENCOUNTER — Ambulatory Visit (INDEPENDENT_AMBULATORY_CARE_PROVIDER_SITE_OTHER): Payer: Medicare Other

## 2020-11-02 DIAGNOSIS — I495 Sick sinus syndrome: Secondary | ICD-10-CM

## 2020-11-04 LAB — CUP PACEART REMOTE DEVICE CHECK
Battery Impedance: 322 Ohm
Battery Remaining Longevity: 114 mo
Battery Voltage: 2.78 V
Brady Statistic AP VP Percent: 0 %
Brady Statistic AP VS Percent: 74 %
Brady Statistic AS VP Percent: 0 %
Brady Statistic AS VS Percent: 26 %
Date Time Interrogation Session: 20211201130619
Implantable Lead Implant Date: 20060620
Implantable Lead Implant Date: 20060620
Implantable Lead Location: 753859
Implantable Lead Location: 753860
Implantable Lead Model: 5076
Implantable Lead Model: 5076
Implantable Pulse Generator Implant Date: 20160219
Lead Channel Impedance Value: 676 Ohm
Lead Channel Impedance Value: 836 Ohm
Lead Channel Pacing Threshold Amplitude: 0.75 V
Lead Channel Pacing Threshold Amplitude: 0.875 V
Lead Channel Pacing Threshold Pulse Width: 0.4 ms
Lead Channel Pacing Threshold Pulse Width: 0.4 ms
Lead Channel Setting Pacing Amplitude: 2 V
Lead Channel Setting Pacing Amplitude: 2.5 V
Lead Channel Setting Pacing Pulse Width: 0.4 ms
Lead Channel Setting Sensing Sensitivity: 5.6 mV

## 2020-11-09 NOTE — Progress Notes (Signed)
Remote pacemaker transmission.   

## 2020-11-16 ENCOUNTER — Encounter: Payer: Self-pay | Admitting: Internal Medicine

## 2020-11-16 NOTE — Progress Notes (Signed)
[facility] DEVICE PROGRAMMING  Patient Information: Name:  [name]  DOB:  03-17-1946  MRN:  [id number]    Eveline Keto, RN  P [facility] Heartcare Device Planned Procedure: Lap Appy  Surgeon: Dr Bobbye Morton  Date of Procedure: 11-29-20  Cautery will be used.  Position during surgery: supine   Please send documentation back to:  [facility] (Fax # [phone])   Eveline Keto, RN  11/16/2020 1:02 PM    Procedure understood to be Laproscopic Appendectomy, if that is incorrect this form is invalid and will need to be returned.   Device Information:  Clinic EP Physician:  [name], MD   Device Type:  Pacemaker Manufacturer and Phone #:  Medtronic: [id number] Pacemaker Dependent?:  No. Date of Last Device Check:  11/02/20 Normal Device Function?:  Yes.    Electrophysiologist's Recommendations:   Have magnet available.  Provide continuous ECG monitoring when magnet is used or reprogramming is to be performed.   Procedure should not interfere with device function.  No device programming or magnet placement needed.  Per Device Clinic Standing Orders, York Ram, RN  4:39 PM 11/16/2020

## 2020-11-18 ENCOUNTER — Encounter (HOSPITAL_BASED_OUTPATIENT_CLINIC_OR_DEPARTMENT_OTHER): Payer: Self-pay | Admitting: Surgery

## 2020-11-18 ENCOUNTER — Other Ambulatory Visit: Payer: Self-pay

## 2020-11-21 MED ORDER — CHLORHEXIDINE GLUCONATE CLOTH 2 % EX PADS: 6.0000 | MEDICATED_PAD | Freq: Once | CUTANEOUS | Status: DC

## 2020-11-21 NOTE — Progress Notes (Signed)

## 2020-11-23 ENCOUNTER — Other Ambulatory Visit: Payer: Self-pay | Admitting: Cardiovascular Disease

## 2020-11-23 DIAGNOSIS — I119 Hypertensive heart disease without heart failure: Secondary | ICD-10-CM

## 2020-11-24 ENCOUNTER — Other Ambulatory Visit (HOSPITAL_COMMUNITY): Payer: Medicare Other

## 2020-11-28 ENCOUNTER — Other Ambulatory Visit (HOSPITAL_COMMUNITY)
Admission: RE | Admit: 2020-11-28 | Discharge: 2020-11-28 | Disposition: A | Discharge status: Home / Self Care | Payer: Medicare Other | Source: Ambulatory Visit | Attending: Surgery | Admitting: Surgery

## 2020-11-28 DIAGNOSIS — Z01812 Encounter for preprocedural laboratory examination: Secondary | ICD-10-CM | POA: Diagnosis not present

## 2020-11-28 DIAGNOSIS — Z20822 Contact with and (suspected) exposure to covid-19: Secondary | ICD-10-CM | POA: Diagnosis not present

## 2020-11-28 LAB — SARS CORONAVIRUS 2 (TAT 6-24 HRS): SARS Coronavirus 2: NEGATIVE

## 2020-11-28 NOTE — Anesthesia Preprocedure Evaluation (Addendum)
Anesthesia Evaluation  Patient identified by MRN, date of birth, ID band Patient awake    Reviewed: Allergy & Precautions, NPO status , Patient's Chart, lab work & pertinent test results  History of Anesthesia Complications (+) PONV  Airway Mallampati: II  TM Distance: >3 FB Neck ROM: Full    Dental no notable dental hx. (+) Teeth Intact, Dental Advisory Given   Pulmonary sleep apnea , neg pneumonia ,    Pulmonary exam normal breath sounds clear to auscultation       Cardiovascular hypertension, Normal cardiovascular exam+ pacemaker  Rhythm:Regular Rate:Normal     Neuro/Psych Anxiety Depression    GI/Hepatic Neg liver ROS, hiatal hernia, GERD  ,  Endo/Other  negative endocrine ROS  Renal/GU negative Renal ROS     Musculoskeletal  (+) Arthritis ,   Abdominal   Peds  Hematology  (+) anemia ,   Anesthesia Other Findings All: see list  Reproductive/Obstetrics                            Anesthesia Physical Anesthesia Plan  ASA: III  Anesthesia Plan: General   Post-op Pain Management:    Induction: Intravenous  PONV Risk Score and Plan: Treatment may vary due to age or medical condition, Ondansetron, Midazolam, TIVA and Dexamethasone  Airway Management Planned: Oral ETT  Additional Equipment: None  Intra-op Plan:   Post-operative Plan: Extubation in OR  Informed Consent: I have reviewed the patients History and Physical, chart, labs and discussed the procedure including the risks, benefits and alternatives for the proposed anesthesia with the patient or authorized representative who has indicated his/her understanding and acceptance.     Dental advisory given  Plan Discussed with: CRNA and Anesthesiologist  Anesthesia Plan Comments: (TIVA )       Anesthesia Quick Evaluation

## 2020-11-29 ENCOUNTER — Encounter (HOSPITAL_BASED_OUTPATIENT_CLINIC_OR_DEPARTMENT_OTHER): Payer: Self-pay | Admitting: Surgery

## 2020-11-29 ENCOUNTER — Encounter (HOSPITAL_BASED_OUTPATIENT_CLINIC_OR_DEPARTMENT_OTHER)
Admission: RE | Disposition: A | Discharge status: Home / Self Care | Payer: Self-pay | Source: Home / Self Care | Attending: Surgery

## 2020-11-29 ENCOUNTER — Ambulatory Visit (HOSPITAL_BASED_OUTPATIENT_CLINIC_OR_DEPARTMENT_OTHER): Payer: Medicare Other | Admitting: Anesthesiology

## 2020-11-29 ENCOUNTER — Other Ambulatory Visit: Payer: Self-pay

## 2020-11-29 ENCOUNTER — Ambulatory Visit (HOSPITAL_BASED_OUTPATIENT_CLINIC_OR_DEPARTMENT_OTHER)
Admission: RE | Admit: 2020-11-29 | Discharge: 2020-11-29 | Disposition: A | Discharge status: Home / Self Care | Payer: Medicare Other | Attending: Surgery | Admitting: Surgery

## 2020-11-29 DIAGNOSIS — K3580 Unspecified acute appendicitis: Secondary | ICD-10-CM | POA: Insufficient documentation

## 2020-11-29 DIAGNOSIS — I495 Sick sinus syndrome: Secondary | ICD-10-CM | POA: Diagnosis not present

## 2020-11-29 DIAGNOSIS — Z96651 Presence of right artificial knee joint: Secondary | ICD-10-CM | POA: Diagnosis not present

## 2020-11-29 DIAGNOSIS — Z885 Allergy status to narcotic agent status: Secondary | ICD-10-CM | POA: Insufficient documentation

## 2020-11-29 DIAGNOSIS — I10 Essential (primary) hypertension: Secondary | ICD-10-CM | POA: Diagnosis not present

## 2020-11-29 DIAGNOSIS — Z881 Allergy status to other antibiotic agents status: Secondary | ICD-10-CM | POA: Diagnosis not present

## 2020-11-29 DIAGNOSIS — R1031 Right lower quadrant pain: Secondary | ICD-10-CM | POA: Insufficient documentation

## 2020-11-29 DIAGNOSIS — E7800 Pure hypercholesterolemia, unspecified: Secondary | ICD-10-CM | POA: Diagnosis not present

## 2020-11-29 DIAGNOSIS — Z950 Presence of cardiac pacemaker: Secondary | ICD-10-CM | POA: Diagnosis not present

## 2020-11-29 DIAGNOSIS — Z9109 Other allergy status, other than to drugs and biological substances: Secondary | ICD-10-CM | POA: Insufficient documentation

## 2020-11-29 HISTORY — DX: Anxiety disorder, unspecified: F41.9

## 2020-11-29 HISTORY — PX: LAPAROSCOPIC APPENDECTOMY: SHX408

## 2020-11-29 HISTORY — DX: Other complications of anesthesia, initial encounter: T88.59XA

## 2020-11-29 SURGERY — APPENDECTOMY, LAPAROSCOPIC
Anesthesia: General | Site: Abdomen

## 2020-11-29 MED ORDER — BUPIVACAINE-EPINEPHRINE 0.25% -1:200000 IJ SOLN
INTRAMUSCULAR | Status: DC | PRN
  Administered 2020-11-29: 28 mL

## 2020-11-29 MED ORDER — ONDANSETRON HCL 4 MG/2ML IJ SOLN
INTRAMUSCULAR | Status: DC | PRN
  Administered 2020-11-29: 4 mg via INTRAVENOUS

## 2020-11-29 MED ORDER — TRAMADOL HCL 50 MG PO TABS: 50.0000 mg | ORAL_TABLET | Freq: Four times a day (QID) | ORAL | 0 refills | Status: DC | PRN

## 2020-11-29 MED ORDER — IBUPROFEN 800 MG PO TABS: 800.0000 mg | ORAL_TABLET | Freq: Three times a day (TID) | ORAL | 1 refills | Status: DC | PRN

## 2020-11-29 MED ORDER — LIDOCAINE 2% (20 MG/ML) 5 ML SYRINGE
INTRAMUSCULAR | Status: AC
  Filled 2020-11-29: qty 5

## 2020-11-29 MED ORDER — ONDANSETRON HCL 4 MG/2ML IJ SOLN: 4.0000 mg | Freq: Once | INTRAMUSCULAR | Status: DC | PRN

## 2020-11-29 MED ORDER — LACTATED RINGERS IV SOLN: INTRAVENOUS | Status: DC

## 2020-11-29 MED ORDER — DEXMEDETOMIDINE HCL 200 MCG/2ML IV SOLN
INTRAVENOUS | Status: DC | PRN
  Administered 2020-11-29: 4 ug via INTRAVENOUS

## 2020-11-29 MED ORDER — PROPOFOL 500 MG/50ML IV EMUL
INTRAVENOUS | Status: DC | PRN
  Administered 2020-11-29: 200 ug/kg/min via INTRAVENOUS

## 2020-11-29 MED ORDER — ROCURONIUM BROMIDE 100 MG/10ML IV SOLN
INTRAVENOUS | Status: DC | PRN
  Administered 2020-11-29 (×2): 35 mg via INTRAVENOUS

## 2020-11-29 MED ORDER — PROPOFOL 10 MG/ML IV BOLUS
INTRAVENOUS | Status: AC
  Filled 2020-11-29: qty 40

## 2020-11-29 MED ORDER — FENTANYL CITRATE (PF) 100 MCG/2ML IJ SOLN
INTRAMUSCULAR | Status: DC | PRN
  Administered 2020-11-29 (×2): 50 ug via INTRAVENOUS

## 2020-11-29 MED ORDER — CEFAZOLIN SODIUM-DEXTROSE 2-4 GM/100ML-% IV SOLN
INTRAVENOUS | Status: AC
  Filled 2020-11-29: qty 100

## 2020-11-29 MED ORDER — FENTANYL CITRATE (PF) 100 MCG/2ML IJ SOLN
INTRAMUSCULAR | Status: AC
  Filled 2020-11-29: qty 2

## 2020-11-29 MED ORDER — SUGAMMADEX SODIUM 200 MG/2ML IV SOLN
INTRAVENOUS | Status: DC | PRN
  Administered 2020-11-29: 200 mg via INTRAVENOUS

## 2020-11-29 MED ORDER — SODIUM CHLORIDE 0.9 % IV SOLN
INTRAVENOUS | Status: DC | PRN
  Administered 2020-11-29: 09:00:00 80 ug via INTRAVENOUS

## 2020-11-29 MED ORDER — ONDANSETRON HCL 4 MG/2ML IJ SOLN
INTRAMUSCULAR | Status: AC
  Filled 2020-11-29: qty 2

## 2020-11-29 MED ORDER — TRAMADOL HCL 50 MG PO TABS
ORAL_TABLET | ORAL | Status: AC
  Filled 2020-11-29: qty 1

## 2020-11-29 MED ORDER — ACETAMINOPHEN 10 MG/ML IV SOLN: 1000.0000 mg | Freq: Once | INTRAVENOUS | Status: DC | PRN

## 2020-11-29 MED ORDER — DOCUSATE SODIUM 100 MG PO CAPS: 100.0000 mg | ORAL_CAPSULE | Freq: Two times a day (BID) | ORAL | 1 refills | Status: AC

## 2020-11-29 MED ORDER — PROPOFOL 10 MG/ML IV BOLUS
INTRAVENOUS | Status: DC | PRN
  Administered 2020-11-29: 140 mg via INTRAVENOUS

## 2020-11-29 MED ORDER — ROCURONIUM BROMIDE 10 MG/ML (PF) SYRINGE
PREFILLED_SYRINGE | INTRAVENOUS | Status: AC
  Filled 2020-11-29: qty 10

## 2020-11-29 MED ORDER — TRAMADOL HCL 50 MG PO TABS: 50.0000 mg | ORAL_TABLET | Freq: Once | ORAL | Status: DC

## 2020-11-29 MED ORDER — ACETAMINOPHEN 500 MG PO TABS
ORAL_TABLET | ORAL | Status: AC
  Filled 2020-11-29: qty 2

## 2020-11-29 MED ORDER — FENTANYL CITRATE (PF) 100 MCG/2ML IJ SOLN
25.0000 ug | INTRAMUSCULAR | Status: DC | PRN
  Administered 2020-11-29: 50 ug via INTRAVENOUS

## 2020-11-29 MED ORDER — DEXAMETHASONE SODIUM PHOSPHATE 10 MG/ML IJ SOLN
INTRAMUSCULAR | Status: AC
  Filled 2020-11-29: qty 1

## 2020-11-29 MED ORDER — ACETAMINOPHEN 500 MG PO TABS
1000.0000 mg | ORAL_TABLET | ORAL | Status: AC
  Administered 2020-11-29: 08:00:00 1000 mg via ORAL

## 2020-11-29 MED ORDER — CEFAZOLIN SODIUM-DEXTROSE 2-4 GM/100ML-% IV SOLN
2.0000 g | INTRAVENOUS | Status: AC
  Administered 2020-11-29: 09:00:00 2 g via INTRAVENOUS

## 2020-11-29 SURGICAL SUPPLY — 50 items
ADH SKN CLS APL DERMABOND .7 (GAUZE/BANDAGES/DRESSINGS) ×1
APL PRP STRL LF DISP 70% ISPRP (MISCELLANEOUS) ×1
APPLIER CLIP ROT 10 11.4 M/L (STAPLE)
APR CLP MED LRG 11.4X10 (STAPLE)
BAG SPEC RTRVL LRG 6X4 10 (ENDOMECHANICALS) ×1
BLADE CLIPPER SURG (BLADE) IMPLANT
CANISTER SUCT 3000ML PPV (MISCELLANEOUS) IMPLANT
CHLORAPREP W/TINT 26 (MISCELLANEOUS) ×3 IMPLANT
CLIP APPLIE ROT 10 11.4 M/L (STAPLE) IMPLANT
COVER SURGICAL LIGHT HANDLE (MISCELLANEOUS) ×3 IMPLANT
COVER WAND RF STERILE (DRAPES) ×3 IMPLANT
CUTTER FLEX LINEAR 45M (STAPLE) ×3 IMPLANT
DERMABOND ADVANCED (GAUZE/BANDAGES/DRESSINGS) ×2
DERMABOND ADVANCED .7 DNX12 (GAUZE/BANDAGES/DRESSINGS) ×1 IMPLANT
ELECT CAUTERY BLADE 6.4 (BLADE) ×1 IMPLANT
ELECT REM PT RETURN 9FT ADLT (ELECTROSURGICAL) ×3
ELECTRODE REM PT RTRN 9FT ADLT (ELECTROSURGICAL) ×1 IMPLANT
ENDOLOOP SUT PDS II  0 18 (SUTURE)
ENDOLOOP SUT PDS II 0 18 (SUTURE) IMPLANT
GLOVE BIO SURGEON STRL SZ 6.5 (GLOVE) ×4 IMPLANT
GLOVE BIO SURGEONS STRL SZ 6.5 (GLOVE) ×3
GLOVE BIOGEL PI IND STRL 6 (GLOVE) ×1 IMPLANT
GLOVE BIOGEL PI INDICATOR 6 (GLOVE) ×2
GLOVE SURG UNDER POLY LF SZ7 (GLOVE) ×4 IMPLANT
GOWN STRL REUS W/ TWL LRG LVL3 (GOWN DISPOSABLE) ×3 IMPLANT
GOWN STRL REUS W/TWL LRG LVL3 (GOWN DISPOSABLE) ×9
KIT BASIN OR (CUSTOM PROCEDURE TRAY) ×3 IMPLANT
KIT TURNOVER KIT B (KITS) ×3 IMPLANT
NDL INSUFFLATION 14GA 120MM (NEEDLE) IMPLANT
NEEDLE INSUFFLATION 14GA 120MM (NEEDLE) IMPLANT
NS IRRIG 1000ML POUR BTL (IV SOLUTION) ×3 IMPLANT
PAD ARMBOARD 7.5X6 YLW CONV (MISCELLANEOUS) ×6 IMPLANT
PENCIL BUTTON HOLSTER BLD 10FT (ELECTRODE) ×3 IMPLANT
POUCH SPECIMEN RETRIEVAL 10MM (ENDOMECHANICALS) ×3 IMPLANT
RELOAD 45 VASCULAR/THIN (ENDOMECHANICALS) ×3 IMPLANT
RELOAD STAPLE 45 2.5 WHT GRN (ENDOMECHANICALS) IMPLANT
RELOAD STAPLE 45 3.5 BLU ETS (ENDOMECHANICALS) IMPLANT
RELOAD STAPLE TA45 3.5 REG BLU (ENDOMECHANICALS) ×3 IMPLANT
SCISSORS LAP 5X35 DISP (ENDOMECHANICALS) IMPLANT
SET IRRIG TUBING LAPAROSCOPIC (IRRIGATION / IRRIGATOR) IMPLANT
SET TUBE SMOKE EVAC HIGH FLOW (TUBING) ×3 IMPLANT
SHEARS HARMONIC ACE PLUS 36CM (ENDOMECHANICALS) IMPLANT
SLEEVE ENDOPATH XCEL 5M (ENDOMECHANICALS) ×3 IMPLANT
SUT MNCRL AB 4-0 PS2 18 (SUTURE) ×3 IMPLANT
SUT VICRYL 0 UR6 27IN ABS (SUTURE) ×2 IMPLANT
TOWEL GREEN STERILE FF (TOWEL DISPOSABLE) ×3 IMPLANT
TRAY LAPAROSCOPIC (CUSTOM PROCEDURE TRAY) ×3 IMPLANT
TROCAR XCEL BLUNT TIP 100MML (ENDOMECHANICALS) IMPLANT
TROCAR XCEL NON-BLD 5MMX100MML (ENDOMECHANICALS) ×3 IMPLANT
WATER STERILE IRR 1000ML POUR (IV SOLUTION) ×1 IMPLANT

## 2020-11-29 NOTE — Anesthesia Procedure Notes (Signed)
Procedure Name: Intubation Performed by: Verita Lamb, CRNA Pre-anesthesia Checklist: Patient identified, Emergency Drugs available, Suction available and Patient being monitored Patient Re-evaluated:Patient Re-evaluated prior to induction Oxygen Delivery Method: Circle system utilized Preoxygenation: Pre-oxygenation with 100% oxygen Induction Type: IV induction Ventilation: Mask ventilation without difficulty Laryngoscope Size: Mac and 3 Grade View: Grade I Tube type: Oral Tube size: 7.0 mm Number of attempts: 1 Airway Equipment and Method: Stylet and Oral airway Placement Confirmation: ETT inserted through vocal cords under direct vision,  positive ETCO2 and breath sounds checked- equal and bilateral Secured at: 22 cm Tube secured with: Tape Dental Injury: Teeth and Oropharynx as per pre-operative assessment

## 2020-11-29 NOTE — H&P (Signed)
[name] is an 74 y.o. female.   HPI: 34F with persistent RLQ abdominal pain afetr medical management of acute appendicitis. To OR for lap appy today. The patient has had no hospitalizations, doctors visits, ER visits, surgeries, or newly diagnosed allergies since being seen in clinic.    Past Medical History:  Diagnosis Date   Adenomatous colon polyp 12/2006   Allergic rhinitis    Anemia    Anxiety    Appendicitis 04/2020   Arthritis    Bradycardia    s/p MDT PPM   Cataract    bilateral   Complication of anesthesia    Depression    Esophageal stricture    Family history of adverse reaction to anesthesia    maternal uncle with MH, nephew had MH during surgery and was OK, pt not been tested   GERD (gastroesophageal reflux disease)    Hemorrhoids    Hyperlipidemia    Hypertension    Inguinal hernia    Osteopenia 12/2012   T score -1.7 FRAX 8.5%/1.1%   Osteoporosis    PONV (postoperative nausea and vomiting)    Presence of permanent cardiac pacemaker 2006   SSS   PVC (premature ventricular contraction)    Scleritis of right eye    Skin cancer    Basal and squamous cell   Sleep apnea    a. intolerant of CPAP, does wear a mouthpiece   Subdural hematoma (HCC)    a. spontanous 09/2017 b. resolved without intervention   Thyroid disease    Vitreous detachment of both eyes     Past Surgical History:  Procedure Laterality Date   ABDOMINAL HYSTERECTOMY     BREAST SURGERY     Biopsy-benign   CESAREAN SECTION     COLONOSCOPY  2018   DILATION AND CURETTAGE OF UTERUS     ESOPHAGOGASTRODUODENOSCOPY     esophageal stretching   HAND SURGERY  1992   HERNIA REPAIR     INGUINAL HERNIA REPAIR  02/12/1994   left inguinal hernia repair   KNEE ARTHROSCOPY  2011   OOPHORECTOMY  1995   LSO and RSO   PACEMAKER GENERATOR CHANGE N/A 01/21/2015   Procedure: PACEMAKER GENERATOR CHANGE;  Surgeon: Hillis Range, MD;  Location: [facility] CATH LAB;  Service: Cardiovascular;  Laterality:  N/A;   PACEMAKER INSERTION  2006   Dr. Reyes Ivan   POLYPECTOMY     Tear duct surgery  2010   THUMB SURGERY     THYROIDECTOMY  1979   RIGHT SIDE   TOTAL KNEE ARTHROPLASTY Right 12/20/2014   Procedure: RIGHT TOTAL KNEE ARTHROPLASTY;  Surgeon: Dannielle Huh, MD;  Location: [facility] OR;  Service: Orthopedics;  Laterality: Right;   UPPER GASTROINTESTINAL ENDOSCOPY     unsure    Family History  Problem Relation Age of Onset   Heart failure Sister    Heart disease Sister    Hypertension Sister    Colon polyps Sister    Dementia Mother    Hypertension Mother    Colon polyps Mother    Hypertension Father    Arthritis Father    Breast cancer Maternal Aunt        Age 48's   Diabetes Maternal Aunt    Diabetes Maternal Uncle    Ovarian cancer Maternal Aunt    Colon cancer Neg Hx    Esophageal cancer Neg Hx    Stomach cancer Neg Hx    Rectal cancer Neg Hx     Social History:  reports that she has never smoked. She has never used smokeless tobacco. She reports that she does not drink alcohol and does not use drugs.  Allergies:  Allergies  Allergen Reactions   Hydrocodone     Increased HR   Antihistamines, Loratadine-Type Other (See Comments)    Heart races/jittery   Bisoprolol-Hydrochlorothiazide Other (See Comments)    Couldn't tolerate UNKNOWN REACTION   Cephalexin Nausea And Vomiting   Codeine Other (See Comments)    Increased heart rate, anxiety   Demerol [Meperidine] Other (See Comments)    Increased heart rate, anxiety   Epinephrine Other (See Comments)    Increased heart rate   Erythromycin Nausea And Vomiting   Hyoscyamine Sulfate Other (See Comments)    Pt could not tolerate this med UNKNOWN reaction   Inderal [Propranolol] Other (See Comments)    Extreme tiredness   Lidocaine Other (See Comments)    Family hx of malignant hyperthermia   Lipitor [Atorvastatin] Other (See Comments)    Muscle aches   Losartan Potassium Other (See Comments)    Pt could not tolerate this  med UNKNOWN REACTION   Metoprolol Swelling   Morphine Other (See Comments)    Nightmares, increased BP   Nebivolol Other (See Comments)    angioedema   Paxil [Paroxetine Hcl] Other (See Comments)    "drove her up the wall"   Penicillins Nausea And Vomiting   Prednisone Other (See Comments)    Tolerates low doses if absolutely necessary - makes her nervous, jittery   Propranolol Hcl Other (See Comments)    Pt could not tolerate this med UNKNOWN REACTION   Sertraline Hcl Other (See Comments)    Pt could not tolerate this med UNKNOWN REACTION   Tetracycline Nausea And Vomiting   Toprol Xl [Metoprolol Succinate] Other (See Comments)    Extreme tiredness   Verapamil Other (See Comments)    Pt could not tolerate this med UNKNOWN REACTION   Hydralazine     headaches   Oxycodone-Acetaminophen Palpitations    Causes accelerated heart rate    Medications: I have reviewed the patient's current medications.  Results for orders placed or performed during the hospital encounter of 11/28/20 (from the past 48 hour(s))  SARS CORONAVIRUS 2 (TAT 6-24 HRS) Nasopharyngeal Nasopharyngeal Swab     Status: None   Collection Time: 11/28/20 10:28 AM   Specimen: Nasopharyngeal Swab  Result Value Ref Range   SARS Coronavirus 2 NEGATIVE NEGATIVE    Comment: (NOTE) SARS-CoV-2 target nucleic acids are NOT DETECTED.  The SARS-CoV-2 RNA is generally detectable in upper and lower respiratory specimens during the acute phase of infection. Negative results do not preclude SARS-CoV-2 infection, do not rule out co-infections with other pathogens, and should not be used as the sole basis for treatment or other patient management decisions. Negative results must be combined with clinical observations, patient history, and epidemiological information. The expected result is Negative.  Fact Sheet for Patients: SugarRoll.be  Fact Sheet for Healthcare  Providers: https://www.woods-mathews.com/  This test is not yet approved or cleared by the [place] FDA and  has been authorized for detection and/or diagnosis of SARS-CoV-2 by FDA under an Emergency Use Authorization (EUA). This EUA will remain  in effect (meaning this test can be used) for the duration of the COVID-19 declaration under Se ction 564(b)(1) of the Act, 21 U.S.C. section 360bbb-3(b)(1), unless the authorization is terminated or revoked sooner.  Performed at [facility], [place] [address], [place], [place]  [address]     No results found.  ROS 10 point review of systems is negative except as listed above in HPI.   Physical Exam Blood pressure (!) 154/93, pulse 76, temperature 98.4 F (36.9 C), temperature source Oral, resp. rate 18, height 5\' 4"  (1.626 m), weight 61 kg, SpO2 98 %. Constitutional: well-developed, well-nourished HEENT: pupils equal, round, reactive to light, 26mm b/l, moist conjunctiva, external inspection of ears and nose normal, hearing intact Oropharynx: normal oropharyngeal mucosa, normal dentition Neck: no thyromegaly, trachea midline, no midline cervical tenderness to palpation Chest: breath sounds equal bilaterally, normal  respiratory effort, no midline or lateral chest wall tenderness to palpation/deformity Abdomen: soft, minimal RLQ TTP, no bruising, no hepatosplenomegaly GU: normal female genitalia  Back: no wounds, no thoracic/lumbar spine tenderness to palpation, no thoracic/lumbar spine stepoffs Rectal: deferred Extremities: 2+ radial and pedal pulses bilaterally, motor and sensation intact to bilateral UE and LE, no peripheral edema MSK: unable to assess gait/station, no clubbing/cyanosis of fingers/toes, normal ROM of all four extremities Skin: warm, dry, no rashes Psych: normal memory, normal mood/affect    Assessment/Plan: 60F with persistent RLQ abdominal pain after medical management of acute appendicitis in  May 2021. To OR today for lap appy. Informed consent was obtained after detailed explanation of risks, including bleeding, infection, abscess, staple line leak, stump appendicitis, and need for conversion to open procedure. All questions answered to the patient's satisfaction.   June 2021, MD General and Trauma Surgery [facility]

## 2020-11-29 NOTE — Anesthesia Postprocedure Evaluation (Signed)
Anesthesia Post Note  Patient: ZAHLIA DESHAZER  Procedure(s) Performed: APPENDECTOMY LAPAROSCOPIC (N/A Abdomen)     Patient location during evaluation: PACU Anesthesia Type: General Level of consciousness: awake and alert Pain management: pain level controlled Vital Signs Assessment: post-procedure vital signs reviewed and stable Respiratory status: spontaneous breathing, nonlabored ventilation, respiratory function stable and patient connected to nasal cannula oxygen Cardiovascular status: blood pressure returned to baseline and stable Postop Assessment: no apparent nausea or vomiting Anesthetic complications: no   No complications documented.  Last Vitals:  Vitals:   11/29/20 1025 11/29/20 1044  BP: (!) 169/91 (!) 152/78  Pulse: 63 66  Resp: 13 14  Temp:  36.4 C  SpO2: 99% 96%    Last Pain:  Vitals:   11/29/20 1044  TempSrc:   PainSc: 4                  Trevor Iha

## 2020-11-29 NOTE — Discharge Instructions (Addendum)
May shower beginning 11/30/2020. Do not peel off or scrub skin glue. May allow warm soapy water to run over incision, then rinse and pat dry. Do not soak in any water (tubs, hot tubs, pools, lakes, oceans) for one week.   No lifting greater than 5 pounds for six weeks.   Call the office at [phone] for temperature greater than 101.41F, worsening pain, redness or warmth at the incision site.  Please call [phone] to make an appointment for 1-2  weeks after surgery for wound check.     Next dose of Tylenol can be given at 1:45pm if needed.   Post Anesthesia Home Care Instructions  Activity: Get plenty of rest for the remainder of the day. A responsible individual must stay with you for 24 hours following the procedure.  For the next 24 hours, DO NOT: -Drive a car -Advertising copywriter -Drink alcoholic beverages -Take any medication unless instructed by your physician -Make any legal decisions or sign important papers.  Meals: Start with liquid foods such as gelatin or soup. Progress to regular foods as tolerated. Avoid greasy, spicy, heavy foods. If nausea and/or vomiting occur, drink only clear liquids until the nausea and/or vomiting subsides. Call your physician if vomiting continues.  Special Instructions/Symptoms: Your throat may feel dry or sore from the anesthesia or the breathing tube placed in your throat during surgery. If this causes discomfort, gargle with warm salt water. The discomfort should disappear within 24 hours.

## 2020-11-29 NOTE — Transfer of Care (Signed)
Immediate Anesthesia Transfer of Care Note  Patient: [name]  Procedure(s) Performed: APPENDECTOMY LAPAROSCOPIC (N/A Abdomen)  Patient Location: PACU  Anesthesia Type:General  Level of Consciousness: awake, alert  and oriented  Airway & Oxygen Therapy: Patient Spontanous Breathing and Patient connected to face mask oxygen  Post-op Assessment: Report given to RN and Post -op Vital signs reviewed and stable  Post vital signs: Reviewed and stable  Last Vitals:  Vitals Value Taken Time  BP    Temp    Pulse 61 11/29/20 0942  Resp 15 11/29/20 0942  SpO2 100 % 11/29/20 0942  Vitals shown include unvalidated device data.  Last Pain:  Vitals:   11/29/20 0740  TempSrc: Oral  PainSc: 0-No pain      Patients Stated Pain Goal: 8 (11/29/20 0740)  Complications: No complications documented.

## 2020-11-29 NOTE — Op Note (Signed)
   Operative Note   Date: 11/29/2020  Procedure: laparoscopic appendectomy  Pre-op diagnosis: h/o acute appendicitis, persistent RLQ abdominal pain Post-op diagnosis: normal appearing appendix  Indication and clinical history: The patient is a 74 y.o. year old female with a h/o acute appendicitis, persistent RLQ abdominal pain.  Surgeon: Diamantina Monks, MD  Anesthesiologist: Richardson Landry, MD Anesthesia: General  Findings:  . Specimen: appendix . EBL: <5cc . Drains/Implants: none  Disposition: PACU - hemodynamically stable.  Description of procedure: The patient was positioned supine on the operating room table. Time-out was performed verifying correct patient, procedure, signature of informed consent, and administration of pre-operative antibiotics. General anesthetic induction and intubation were uneventful. Foley catheter insertion was not performed as patient voided immediately prior to the procedure . The abdomen was prepped and draped in the usual sterile fashion. An infra-umbilical incision was made using an open technique using zero vicryl stay sutures on either side of the fascia and a 23mm Hassan port inserted. After establishing pneumoperitoneum, which the patient tolerated well, the abdominal cavity was inspected and no injury of any intra-abdominal structures was identified. Two additional five millimeter ports were placed under direct visualization and using local anesthetic in the suprapubic and left lower quadrant regions. The patient was repositioned to Trendelenburg with the left side down. Further inspection of the right lower quadrant revealed a normal appendis. The appendix was dissected away from its mesoappendix and an endoscopic stapler used to staple across the appendix at its base using a blue load. Adhesiolysis was performed in order to mobilize the mesoappendix, which was then divided using a white staple load. A bowel load of the endoscopic stapler was used to . Both  staple lines were inspected and found to be intact. There was minimal amount of bleeding from the proximal mesoappendix staple line which was controlled with electrocautery. The appendix was placed in an endoscopic specimen retrieval bag, removed via the umbilical port site, and sent to pathology as a specimen. The right lower quadrant was again inspected and hemostasis confirmed. The umbilical port was removed and the fascia re-approximated using the stay sutures and an addition 0 vicryl suture was used to re-approximate the fascia under direct visualization. Additional local anesthetic was administered at the umbilical incision site. The skin of all port sites was closed with 4-0 monocryl. Sterile dressings were applied. All sponge and instrument counts were correct at the conclusion of the procedure. The patient was awakened from anesthesia, extubated uneventfully, and transported to the PACU in good condition. There were no complications.    Diamantina Monks, MD General and Trauma Surgery [facility]

## 2020-11-30 ENCOUNTER — Encounter (HOSPITAL_BASED_OUTPATIENT_CLINIC_OR_DEPARTMENT_OTHER): Payer: Self-pay | Admitting: Surgery

## 2020-11-30 LAB — SURGICAL PATHOLOGY

## 2020-12-21 ENCOUNTER — Other Ambulatory Visit: Payer: Self-pay | Admitting: Family Medicine

## 2020-12-21 DIAGNOSIS — H34811 Central retinal vein occlusion, right eye, with macular edema: Secondary | ICD-10-CM | POA: Diagnosis not present

## 2020-12-21 NOTE — Telephone Encounter (Signed)
Pharmacy requests refill on: Lorazepam 1 mg   LAST REFILL: 09/13/2020 (Q-120, R-2) LAST OV: 07/11/2020 NEXT OV: Not Scheduled  PHARMACY: [place], [place]

## 2020-12-21 NOTE — Telephone Encounter (Signed)
Sent. Thanks.   

## 2021-01-30 DIAGNOSIS — H2513 Age-related nuclear cataract, bilateral: Secondary | ICD-10-CM | POA: Diagnosis not present

## 2021-02-01 ENCOUNTER — Ambulatory Visit (INDEPENDENT_AMBULATORY_CARE_PROVIDER_SITE_OTHER): Payer: Medicare Other

## 2021-02-01 DIAGNOSIS — I495 Sick sinus syndrome: Secondary | ICD-10-CM

## 2021-02-03 LAB — CUP PACEART REMOTE DEVICE CHECK
Battery Impedance: 346 Ohm
Battery Remaining Longevity: 110 mo
Battery Voltage: 2.78 V
Brady Statistic AP VP Percent: 0 %
Brady Statistic AP VS Percent: 71 %
Brady Statistic AS VP Percent: 0 %
Brady Statistic AS VS Percent: 29 %
Date Time Interrogation Session: 20220302102625
Implantable Lead Implant Date: 20060620
Implantable Lead Implant Date: 20060620
Implantable Lead Location: 753859
Implantable Lead Location: 753860
Implantable Lead Model: 5076
Implantable Lead Model: 5076
Implantable Pulse Generator Implant Date: 20160219
Lead Channel Impedance Value: 627 Ohm
Lead Channel Impedance Value: 758 Ohm
Lead Channel Pacing Threshold Amplitude: 0.75 V
Lead Channel Pacing Threshold Amplitude: 0.875 V
Lead Channel Pacing Threshold Pulse Width: 0.4 ms
Lead Channel Pacing Threshold Pulse Width: 0.4 ms
Lead Channel Setting Pacing Amplitude: 2 V
Lead Channel Setting Pacing Amplitude: 2.5 V
Lead Channel Setting Pacing Pulse Width: 0.4 ms
Lead Channel Setting Sensing Sensitivity: 5.6 mV

## 2021-02-08 ENCOUNTER — Encounter: Payer: Self-pay | Admitting: Cardiovascular Disease

## 2021-02-08 ENCOUNTER — Other Ambulatory Visit: Payer: Self-pay

## 2021-02-08 ENCOUNTER — Ambulatory Visit (INDEPENDENT_AMBULATORY_CARE_PROVIDER_SITE_OTHER): Payer: Medicare Other | Admitting: Cardiovascular Disease

## 2021-02-08 VITALS — BP 158/82 | HR 71 | Ht 64.0 in | Wt 135.2 lb

## 2021-02-08 DIAGNOSIS — I491 Atrial premature depolarization: Secondary | ICD-10-CM

## 2021-02-08 DIAGNOSIS — I10 Essential (primary) hypertension: Secondary | ICD-10-CM | POA: Diagnosis not present

## 2021-02-08 DIAGNOSIS — I495 Sick sinus syndrome: Secondary | ICD-10-CM

## 2021-02-08 NOTE — Patient Instructions (Signed)

## 2021-02-08 NOTE — Progress Notes (Signed)
Chief Complaint  Patient presents with  . Follow-up    PVCs   History of Present Illness: 75 yo female with history of sleep apnea, HLD, HTN, GERD/hiatal hernia/esopagheal stricture, sick sinus syndrome s/p pacemaker, PACs, PVCs who is here today for cardiac follow up. She had been followed by Dr. Mare Ferrari for general cardiology issues. Her pacemaker is followed by Dr. Rayann Heman. The pacemaker was changed in 2016 due to end of battery life. Echo June 2017 with normal LV function and small pericardial effusion with no tamponade. She is known to have PACs and PVCs.  She had a spontaneous subdural hematoma in October 2018 that did not require surgical intervention. Echo May 2021 with LVEF=60-65%. No valve disease.   She is here today for follow up. The patient denies any chest pain, dyspnea, palpitations, lower extremity edema, orthopnea, PND, dizziness, near syncope or syncope.   Primary Care Physician: Tonia Ghent, MD  Past Medical History:  Diagnosis Date  . Adenomatous colon polyp 12/2006  . Allergic rhinitis   . Anemia   . Anxiety   . Appendicitis 04/2020  . Arthritis   . Bradycardia    s/p MDT PPM  . Cataract    bilateral  . Complication of anesthesia   . Depression   . Esophageal stricture   . Family history of adverse reaction to anesthesia    maternal uncle with MH, nephew had MH during surgery and was OK, pt not been tested  . GERD (gastroesophageal reflux disease)   . Hemorrhoids   . Hyperlipidemia   . Hypertension   . Inguinal hernia   . Osteopenia 12/2012   T score -1.7 FRAX 8.5%/1.1%  . Osteoporosis   . PONV (postoperative nausea and vomiting)   . Presence of permanent cardiac pacemaker 2006   SSS  . PVC (premature ventricular contraction)   . Scleritis of right eye   . Skin cancer    Basal and squamous cell  . Sleep apnea    a. intolerant of CPAP, does wear a mouthpiece  . Subdural hematoma ([place])    a. spontanous 09/2017 b. resolved without intervention   . Thyroid disease   . Vitreous detachment of both eyes     Past Surgical History:  Procedure Laterality Date  . ABDOMINAL HYSTERECTOMY    . APPENDECTOMY    . BREAST SURGERY     Biopsy-benign  . CESAREAN SECTION    . COLONOSCOPY  2018  . DILATION AND CURETTAGE OF UTERUS    . ESOPHAGOGASTRODUODENOSCOPY     esophageal stretching  . HAND SURGERY  1992  . HERNIA REPAIR    . INGUINAL HERNIA REPAIR  02/12/1994   left inguinal hernia repair  . KNEE ARTHROSCOPY  2011  . LAPAROSCOPIC APPENDECTOMY N/A 11/29/2020   Procedure: APPENDECTOMY LAPAROSCOPIC;  Surgeon: Jesusita Oka, MD;  Location: [facility];  Service: General;  Laterality: N/A;  . OOPHORECTOMY  1995   LSO and RSO  . PACEMAKER GENERATOR CHANGE N/A 01/21/2015   Procedure: PACEMAKER GENERATOR CHANGE;  Surgeon: [name], MD;  Location: [facility] CATH LAB;  Service: Cardiovascular;  Laterality: N/A;  . PACEMAKER INSERTION  2006   Dr. Verlon Setting  . POLYPECTOMY    . Tear duct surgery  2010  . THUMB SURGERY    . THYROIDECTOMY  1979   RIGHT SIDE  . TOTAL KNEE ARTHROPLASTY Right 12/20/2014   Procedure: RIGHT TOTAL KNEE ARTHROPLASTY;  Surgeon: Vickey Huger, MD;  Location: [facility];  Service:  Orthopedics;  Laterality: Right;  . UPPER GASTROINTESTINAL ENDOSCOPY     unsure    Current Outpatient Medications  Medication Sig Dispense Refill  . acetaminophen (TYLENOL) 500 MG tablet Take 1,000 mg by mouth every 6 (six) hours as needed (pain).    . Carboxymethylcell-Hypromellose 0.25-0.3 % GEL Place 1 application into both eyes at bedtime.     Marland Kitchen CARTIA XT 120 MG 24 hr capsule Take 1 capsule by mouth once daily 90 capsule 3  . famotidine (PEPCID) 20 MG tablet Take 1 tablet by mouth twice daily 180 tablet 3  . LORazepam (ATIVAN) 1 MG tablet TAKE 1 TABLET BY MOUTH EVERY 6 HOURS AS NEEDED 120 tablet 2  . nitroGLYCERIN (NITROSTAT) 0.4 MG SL tablet Place 1 tablet (0.4 mg total) under the tongue every 5 (five) minutes as needed. For up to 3  doses 25 tablet 6  . Polyethyl Glycol-Propyl Glycol (SYSTANE OP) Place 1 drop into both eyes daily as needed (dry eyes).     Marland Kitchen terconazole (TERAZOL 7) 0.4 % vaginal cream Place 1 applicator vaginally at bedtime. For 7 nights 45 g 4  . traMADol (ULTRAM) 50 MG tablet Take 1 tablet (50 mg total) by mouth every 6 (six) hours as needed. 30 tablet 0   No current facility-administered medications for this visit.    Allergies  Allergen Reactions  . Hydrocodone     Increased HR  . Antihistamines, Loratadine-Type Other (See Comments)    Heart races/jittery  . Bisoprolol-Hydrochlorothiazide Other (See Comments)    Couldn't tolerate UNKNOWN REACTION  . Cephalexin Nausea And Vomiting  . Codeine Other (See Comments)    Increased heart rate, anxiety  . Demerol [Meperidine] Other (See Comments)    Increased heart rate, anxiety  . Epinephrine Other (See Comments)    Increased heart rate  . Erythromycin Nausea And Vomiting  . Hyoscyamine Sulfate Other (See Comments)    Pt could not tolerate this med UNKNOWN reaction  . Inderal [Propranolol] Other (See Comments)    Extreme tiredness  . Lidocaine Other (See Comments)    Family hx of malignant hyperthermia  . Lipitor [Atorvastatin] Other (See Comments)    Muscle aches  . Losartan Potassium Other (See Comments)    Pt could not tolerate this med UNKNOWN REACTION  . Metoprolol Swelling  . Morphine Other (See Comments)    Nightmares, increased BP  . Nebivolol Other (See Comments)    angioedema  . Paxil [Paroxetine Hcl] Other (See Comments)    "drove her up the wall"  . Penicillins Nausea And Vomiting  . Prednisone Other (See Comments)    Tolerates low doses if absolutely necessary - makes her nervous, jittery  . Propranolol Hcl Other (See Comments)    Pt could not tolerate this med UNKNOWN REACTION  . Sertraline Hcl Other (See Comments)    Pt could not tolerate this med UNKNOWN REACTION  . Tetracycline Nausea And Vomiting  . Toprol Xl  [Metoprolol Succinate] Other (See Comments)    Extreme tiredness  . Verapamil Other (See Comments)    Pt could not tolerate this med UNKNOWN REACTION  . Hydralazine     headaches  . Oxycodone-Acetaminophen Palpitations    Causes accelerated heart rate    Social History   Socioeconomic History  . Marital status: Married    Spouse name: Harrie Jeans  . Number of children: 1  . Years of education: Not on file  . Highest education level: Not on file  Occupational History  .  Occupation: Retired    Fish farm manager: UNEMPLOYED  Tobacco Use  . Smoking status: Never Smoker  . Smokeless tobacco: Never Used  Vaping Use  . Vaping Use: Never used  Substance and Sexual Activity  . Alcohol use: No    Alcohol/week: 0.0 standard drinks  . Drug use: No  . Sexual activity: Not Currently    Birth control/protection: Surgical, Post-menopausal    Comment: HYST-1st intercourse 75 yo-fewer than 5 partners  Other Topics Concern  . 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 Difficulty of Paying Living Expenses: Not hard at all  Food Insecurity: No Food Insecurity  . Worried About Charity fundraiser in the Last Year: Never true  . Ran Out of Food in the Last Year: Never true  Transportation Needs: No Transportation Needs  . Lack of Transportation (Medical): No  . Lack of Transportation (Non-Medical): No  Physical Activity: Inactive  . Days of Exercise per Week: 0 days  . Minutes of Exercise per Session: 0 min  Stress: Stress Concern Present  . Feeling of Stress : To some extent  Social Connections: Not on file  Intimate Partner Violence: Not At Risk  . Fear of Current or Ex-Partner: No  . Emotionally Abused: No  . Physically Abused: No  . Sexually Abused: No    Family History  Problem Relation Age of Onset  . Heart  failure Sister   . Heart disease Sister   . Hypertension Sister   . Colon polyps Sister   . Dementia Mother   . Hypertension Mother   . Colon polyps Mother   . Hypertension Father   . Arthritis Father   . Breast cancer Maternal Aunt        Age 58's  . Diabetes Maternal Aunt   . Diabetes Maternal Uncle   . Ovarian cancer Maternal Aunt   . Colon cancer Neg Hx   . Esophageal cancer Neg Hx   . Stomach cancer Neg Hx   . Rectal cancer Neg Hx     Review of Systems:  As stated in the HPI and otherwise negative.   BP (!) 158/82   Pulse 71   Ht 5\' 4"  (1.626 m)   Wt 135 lb 3.2 oz (61.3 kg)   SpO2 97%   BMI 23.21 kg/m   Physical Examination:  General: Well developed, well nourished, NAD  HEENT: OP clear, mucus membranes moist  SKIN: warm, dry. No rashes. Neuro: No focal deficits  Musculoskeletal: Muscle strength 5/5 all ext  Psychiatric: Mood and affect normal  Neck: No JVD, no carotid bruits, no thyromegaly, no lymphadenopathy.  Lungs:Clear bilaterally, no wheezes, rhonci, crackles Cardiovascular: Regular rate and rhythm. No murmurs, gallops or rubs. Abdomen:Soft. Bowel sounds present. Non-tender.  Extremities: No lower extremity edema. Pulses are 2 + in the bilateral DP/PT.  Echo May 2021: 1. Left ventricular ejection fraction, by estimation, is 60 to 65%. The  left ventricle has normal function. The left ventricle has no regional  wall motion abnormalities. Left ventricular diastolic parameters were  normal.  2. Right ventricular systolic function is normal. The right ventricular  size is normal. There is normal pulmonary artery systolic pressure.  3. The mitral valve is normal in structure. No evidence of mitral valve  regurgitation. No evidence of mitral stenosis.  4. The aortic valve is normal in structure. Aortic valve regurgitation is  not visualized. No aortic stenosis is present.  5. The inferior vena cava is normal in size with greater than 50%  respiratory  variability, suggesting right atrial pressure of 3 mmHg.   EKG:  EKG is ordered today. The ekg ordered today demonstrates   Recent Labs: 04/16/2020: Hemoglobin 13.5; Platelets 182 06/28/2020: ALT 13; BUN 15; Creatinine, Ser 0.86; Potassium 4.2; Sodium 134; TSH 2.17   Lipid Panel    Component Value Date/Time   CHOL 249 (H) 06/28/2020 1010   TRIG 117.0 06/28/2020 1010   HDL 59.90 06/28/2020 1010   CHOLHDL 4 06/28/2020 1010   VLDL 23.4 06/28/2020 1010   LDLCALC 166 (H) 06/28/2020 1010   LDLDIRECT 146.6 01/25/2014 0953     Wt Readings from Last 3 Encounters:  02/08/21 135 lb 3.2 oz (61.3 kg)  11/29/20 134 lb 7.7 oz (61 kg)  07/29/20 133 lb 9.6 oz (60.6 kg)     Other studies Reviewed: Additional studies/ records that were reviewed today include: . Review of the above records demonstrates:    Assessment and Plan:   1. HTN: BP is elevated today. She will follow at home. No changes today  2. HLD: Followed in primary care. She is statin intolerant  3. Symptomatic bradycardia: Her pacemaker is followed by Dr. Rayann Heman  4. Palpitations/PVCs/PACs: No recent palpitations. Continue Cardizem  Current medicines are reviewed at length with the patient today.  The patient does not have concerns regarding medicines.  The following changes have been made:  no change  Labs/ tests ordered today include:   No orders of the defined types were placed in this encounter.    Disposition:   FU with me in 12  months   Signed, Lauree Chandler, MD 02/08/2021 4:30 PM    [address], [place], [place]  [address] Phone: [phone]; Fax: [phone]

## 2021-02-10 NOTE — Progress Notes (Signed)
Remote pacemaker transmission.   

## 2021-02-24 ENCOUNTER — Other Ambulatory Visit: Payer: Self-pay

## 2021-02-24 ENCOUNTER — Ambulatory Visit (INDEPENDENT_AMBULATORY_CARE_PROVIDER_SITE_OTHER): Payer: Medicare Other | Admitting: Family Medicine

## 2021-02-24 ENCOUNTER — Encounter: Payer: Self-pay | Admitting: Family Medicine

## 2021-02-24 VITALS — BP 144/100 | HR 86 | Temp 97.5°F | Ht 64.0 in | Wt 135.0 lb

## 2021-02-24 DIAGNOSIS — R300 Dysuria: Secondary | ICD-10-CM | POA: Diagnosis not present

## 2021-02-24 LAB — POC URINALSYSI DIPSTICK (AUTOMATED)
Bilirubin, UA: NEGATIVE
Blood, UA: NEGATIVE
Glucose, UA: NEGATIVE
Ketones, UA: NEGATIVE
Leukocytes, UA: NEGATIVE
Nitrite, UA: NEGATIVE
Protein, UA: NEGATIVE
Spec Grav, UA: 1.02 (ref 1.010–1.025)
Urobilinogen, UA: 0.2 E.U./dL
pH, UA: 6 (ref 5.0–8.0)

## 2021-02-24 MED ORDER — NITROFURANTOIN MONOHYD MACRO 100 MG PO CAPS: 100.0000 mg | ORAL_CAPSULE | Freq: Two times a day (BID) | ORAL | 0 refills | Status: DC

## 2021-02-24 NOTE — Progress Notes (Signed)
This visit occurred during the SARS-CoV-2 public health emergency.  Safety protocols were in place, including screening questions prior to the visit, additional usage of staff PPE, and extensive cleaning of exam room while observing appropriate contact time as indicated for disinfecting solutions.  11/2020- appendectomy.  She gradually improved thereafter.  She had occ twinge of pain at the lower abd incision site and some pain at the urethra with urination.  It didn't happen every time.  Incision site pain is better but still with occ urethral pain.  Minimal irritation after voiding this AM.  No fevers chills.  She does not feel acutely ill.  Meds, vitals, and allergies reviewed.  Per HPI unless specifically indicated in ROS section   GEN: nad, alert and oriented HEENT: ncat NECK: supple CV: rrr.  PULM: ctab, no inc wob ABD: soft, +bs, suprapubic area not tender. Surgical sites healed in the meantime, not tender, no drainage. EXT: no edema SKIN: no acute rash BACK: no CVA pain

## 2021-02-24 NOTE — Patient Instructions (Signed)
Get a urine sample collected when possible.   Drink plenty of water.  Start macrobid if more pain with urination in the meantime.   Take care.  Glad to see you.

## 2021-02-26 ENCOUNTER — Other Ambulatory Visit: Payer: Self-pay | Admitting: Family Medicine

## 2021-02-26 ENCOUNTER — Encounter: Payer: Self-pay | Admitting: Family Medicine

## 2021-02-26 LAB — URINE CULTURE
MICRO NUMBER:: 11693487
SPECIMEN QUALITY:: ADEQUATE

## 2021-02-26 MED ORDER — SULFAMETHOXAZOLE-TRIMETHOPRIM 800-160 MG PO TABS: 1.0000 | ORAL_TABLET | Freq: Two times a day (BID) | ORAL | 0 refills | Status: DC

## 2021-02-26 NOTE — Assessment & Plan Note (Signed)
Her previous operative/incision sites look healed and normal and she does not have any tenderness there.  We discussed options otherwise.  Reasonable to check urine culture when possible and hold prescription for Macrobid in the meantime.  If she has progression of urinary symptoms, then reasonable to start the antibiotics after the urine sample has been collected.  Routine instructions given to patient.  She agrees.  Okay for outpatient follow-up.  See notes on urine culture.  Urinalysis discussed with patient at office visit.

## 2021-03-03 ENCOUNTER — Telehealth: Payer: Self-pay

## 2021-03-03 NOTE — Telephone Encounter (Signed)
The patient wants to know if the nurse can go over her transmission from yesterday.

## 2021-03-03 NOTE — Telephone Encounter (Signed)
Spoke with pt.  Reviewed transmission.   Normal device function, nothing on report to explain symptoms she is having.  Pt reports she has had a pulse/ muscle twitch feeling in her left breast/ left axillary region for about 2 weeks.  It is not painful just noticeable.    Advised pt to f/u with PCP.

## 2021-03-16 DIAGNOSIS — H34811 Central retinal vein occlusion, right eye, with macular edema: Secondary | ICD-10-CM | POA: Diagnosis not present

## 2021-03-30 ENCOUNTER — Other Ambulatory Visit: Payer: Self-pay | Admitting: Family Medicine

## 2021-03-30 NOTE — Telephone Encounter (Signed)
Refill request for Lorazepam 1 mg tablets   LOV - 02/24/21 Next OV - not scheduled Last refill - 12/21/20 #120/2

## 2021-03-31 NOTE — Telephone Encounter (Signed)
Sent. Thanks.   

## 2021-04-12 ENCOUNTER — Other Ambulatory Visit: Payer: Self-pay | Admitting: Family Medicine

## 2021-04-12 DIAGNOSIS — Z1231 Encounter for screening mammogram for malignant neoplasm of breast: Secondary | ICD-10-CM

## 2021-04-24 ENCOUNTER — Other Ambulatory Visit: Payer: Self-pay

## 2021-04-24 ENCOUNTER — Encounter: Payer: Self-pay | Admitting: Family Medicine

## 2021-04-24 ENCOUNTER — Ambulatory Visit (INDEPENDENT_AMBULATORY_CARE_PROVIDER_SITE_OTHER): Payer: Medicare Other | Admitting: Family Medicine

## 2021-04-24 VITALS — BP 124/84 | HR 80 | Temp 97.5°F | Ht 64.0 in | Wt 135.0 lb

## 2021-04-24 DIAGNOSIS — R300 Dysuria: Secondary | ICD-10-CM | POA: Diagnosis not present

## 2021-04-24 DIAGNOSIS — R5383 Other fatigue: Secondary | ICD-10-CM

## 2021-04-24 DIAGNOSIS — R109 Unspecified abdominal pain: Secondary | ICD-10-CM

## 2021-04-24 NOTE — Patient Instructions (Signed)
Go to the lab on the way out.   If you have mychart we'll likely use that to update you.    Take care.  Glad to see you. See if the urinary symptoms coincide with the right lower abdominal pain.

## 2021-04-24 NOTE — Progress Notes (Signed)
This visit occurred during the SARS-CoV-2 public health emergency.  Safety protocols were in place, including screening questions prior to the visit, additional usage of staff PPE, and extensive cleaning of exam room while observing appropriate contact time as indicated for disinfecting solutions.  She had UTI with ucx positive in 01/2021, tx'd with septra.  She felt like she got better.  Now with some days with burning with urination, but then days w/o sx.  No blood seen in urine.  She drinks a lot of water at baseline.  No soda except for rare Sprite.    H/o appendectomy in 11/2021.  She had occ RLQ pain in the area, after surgery.  It was intermittent.  Some days with sx, ttp, some days with no sx.  Unclear if this is concurrent with bladder sx.  No fevers.  No blood in stool.  Some discomfort earlier today, better now.  Some discomfort after loose BM.    Prev colonoscopy with mild diverticulosis in the left colon.  Some fatigue, predates appendicitis.  Continues in the meantime.  covid disruption d/w pt, social stressors d/w pt.   She has a known history of degenerative changes in her neck and she was asking about follow-up.  She is going to check with the neurosurgery clinic about follow-up and I think that is reasonable.  She does not have new or emergent symptoms.  Meds, vitals, and allergies reviewed.   ROS: Per HPI unless specifically indicated in ROS section   GEN: nad, alert and oriented HEENT: ncat NECK: supple w/o LA CV: rrr. PULM: ctab, no inc wob ABD: soft, +bs, not tender to palpation. EXT: no edema SKIN: no acute rash  35 minutes were devoted to patient care in this encounter (this includes time spent reviewing the patient's file/history, interviewing and examining the patient, counseling/reviewing plan with patient).

## 2021-04-25 LAB — CBC WITH DIFFERENTIAL/PLATELET
Basophils Absolute: 0 10*3/uL (ref 0.0–0.1)
Basophils Relative: 0.7 % (ref 0.0–3.0)
Eosinophils Absolute: 0.1 10*3/uL (ref 0.0–0.7)
Eosinophils Relative: 1.2 % (ref 0.0–5.0)
HCT: 38.9 % (ref 36.0–46.0)
Hemoglobin: 13.5 g/dL (ref 12.0–15.0)
Lymphocytes Relative: 31 % (ref 12.0–46.0)
Lymphs Abs: 1.3 10*3/uL (ref 0.7–4.0)
MCHC: 34.6 g/dL (ref 30.0–36.0)
MCV: 95 fl (ref 78.0–100.0)
Monocytes Absolute: 0.4 10*3/uL (ref 0.1–1.0)
Monocytes Relative: 9.6 % (ref 3.0–12.0)
Neutro Abs: 2.4 10*3/uL (ref 1.4–7.7)
Neutrophils Relative %: 57.5 % (ref 43.0–77.0)
Platelets: 213 10*3/uL (ref 150.0–400.0)
RBC: 4.1 Mil/uL (ref 3.87–5.11)
RDW: 13.1 % (ref 11.5–15.5)
WBC: 4.2 10*3/uL (ref 4.0–10.5)

## 2021-04-25 LAB — COMPREHENSIVE METABOLIC PANEL
ALT: 11 U/L (ref 0–35)
AST: 13 U/L (ref 0–37)
Albumin: 4.4 g/dL (ref 3.5–5.2)
Alkaline Phosphatase: 49 U/L (ref 39–117)
BUN: 22 mg/dL (ref 6–23)
CO2: 28 mEq/L (ref 19–32)
Calcium: 9.2 mg/dL (ref 8.4–10.5)
Chloride: 99 mEq/L (ref 96–112)
Creatinine, Ser: 0.89 mg/dL (ref 0.40–1.20)
GFR: 64.32 mL/min (ref >60.00)
Glucose, Bld: 97 mg/dL (ref 70–99)
Potassium: 4.6 mEq/L (ref 3.5–5.1)
Sodium: 137 mEq/L (ref 135–145)
Total Bilirubin: 0.4 mg/dL (ref 0.2–1.2)
Total Protein: 6.8 g/dL (ref 6.0–8.3)

## 2021-04-25 LAB — URINE CULTURE
MICRO NUMBER:: 11922485
SPECIMEN QUALITY:: ADEQUATE

## 2021-04-25 LAB — TSH: TSH: 2.3 u[IU]/mL (ref 0.35–4.50)

## 2021-04-25 LAB — LIPASE: Lipase: 30 U/L (ref 11.0–59.0)

## 2021-04-26 NOTE — Assessment & Plan Note (Signed)
Reasonable to check routine labs.  See notes on labs.  Okay for outpatient follow-up.

## 2021-04-26 NOTE — Assessment & Plan Note (Signed)
History of UTI.  Also with episodic right lower quadrant abdominal pain.  Unclear if that occurs when she has dysuria.  I asked patient to check on this and see if she notices a pattern where the 2 occur simultaneously.  Check urine culture today.  Still okay for outpatient follow-up.  She agrees with plan.  Benign abdominal exam now.

## 2021-05-03 ENCOUNTER — Ambulatory Visit (INDEPENDENT_AMBULATORY_CARE_PROVIDER_SITE_OTHER): Payer: Medicare Other

## 2021-05-03 DIAGNOSIS — I495 Sick sinus syndrome: Secondary | ICD-10-CM

## 2021-05-04 LAB — CUP PACEART REMOTE DEVICE CHECK
Battery Impedance: 346 Ohm
Battery Remaining Longevity: 111 mo
Battery Voltage: 2.78 V
Brady Statistic AP VP Percent: 0 %
Brady Statistic AP VS Percent: 70 %
Brady Statistic AS VP Percent: 0 %
Brady Statistic AS VS Percent: 30 %
Date Time Interrogation Session: 20220601090612
Implantable Lead Implant Date: 20060620
Implantable Lead Implant Date: 20060620
Implantable Lead Location: 753859
Implantable Lead Location: 753860
Implantable Lead Model: 5076
Implantable Lead Model: 5076
Implantable Pulse Generator Implant Date: 20160219
Lead Channel Impedance Value: 674 Ohm
Lead Channel Impedance Value: 819 Ohm
Lead Channel Pacing Threshold Amplitude: 0.5 V
Lead Channel Pacing Threshold Amplitude: 0.75 V
Lead Channel Pacing Threshold Pulse Width: 0.4 ms
Lead Channel Pacing Threshold Pulse Width: 0.4 ms
Lead Channel Setting Pacing Amplitude: 2 V
Lead Channel Setting Pacing Amplitude: 2.5 V
Lead Channel Setting Pacing Pulse Width: 0.4 ms
Lead Channel Setting Sensing Sensitivity: 5.6 mV

## 2021-05-22 DIAGNOSIS — H34811 Central retinal vein occlusion, right eye, with macular edema: Secondary | ICD-10-CM | POA: Diagnosis not present

## 2021-05-26 NOTE — Progress Notes (Signed)
Remote pacemaker transmission.   

## 2021-06-07 ENCOUNTER — Other Ambulatory Visit: Payer: Self-pay

## 2021-06-07 ENCOUNTER — Ambulatory Visit
Admission: RE | Admit: 2021-06-07 | Discharge: 2021-06-07 | Disposition: A | Discharge status: Home / Self Care | Payer: Medicare Other | Source: Ambulatory Visit | Attending: Family Medicine | Admitting: Family Medicine

## 2021-06-07 DIAGNOSIS — Z1231 Encounter for screening mammogram for malignant neoplasm of breast: Secondary | ICD-10-CM

## 2021-07-03 ENCOUNTER — Telehealth: Payer: Self-pay

## 2021-07-03 ENCOUNTER — Telehealth (INDEPENDENT_AMBULATORY_CARE_PROVIDER_SITE_OTHER): Payer: Medicare Other | Admitting: Family Medicine

## 2021-07-03 ENCOUNTER — Encounter: Payer: Self-pay | Admitting: Family Medicine

## 2021-07-03 VITALS — BP 147/92 | Ht 64.0 in | Wt 135.0 lb

## 2021-07-03 DIAGNOSIS — U071 COVID-19: Secondary | ICD-10-CM | POA: Diagnosis not present

## 2021-07-03 MED ORDER — ONDANSETRON HCL 4 MG PO TABS: 4.0000 mg | ORAL_TABLET | Freq: Three times a day (TID) | ORAL | 0 refills | Status: DC | PRN

## 2021-07-03 MED ORDER — MOLNUPIRAVIR EUA 200MG CAPSULE: 4.0000 | ORAL_CAPSULE | Freq: Two times a day (BID) | ORAL | 0 refills | Status: DC

## 2021-07-03 MED ORDER — MOLNUPIRAVIR EUA 200MG CAPSULE: 4.0000 | ORAL_CAPSULE | Freq: Two times a day (BID) | ORAL | 0 refills | Status: AC

## 2021-07-03 NOTE — Telephone Encounter (Signed)
Please see note below access nurse note. 

## 2021-07-03 NOTE — Telephone Encounter (Signed)
Noted. Thanks.

## 2021-07-03 NOTE — Progress Notes (Signed)
    I connected with [name] on 07/03/21 at 11:00 AM EDT by video and verified that I am speaking with the correct person using two identifiers.   I discussed the limitations, risks, security and privacy concerns of performing an evaluation and management service by video and the availability of in person appointments. I also discussed with the patient that there may be a patient responsible charge related to this service. The patient expressed understanding and agreed to proceed.  Patient location: Home Provider Location: [place] Participants: Lesleigh Noe and [name]   Subjective:     [name] is a 75 y.o. female presenting for Covid Positive     HPI  #Covid positive - 06/30/2021  - sore throat - Saturday bad headache - initial covid test negaive - yesterday covid test which was positive - decreased appetitie - trying to keep herself hydrated - feels very tired  Review of Systems  Constitutional:  Positive for appetite change and fatigue. Negative for chills and fever.  HENT:  Positive for rhinorrhea and sore throat.   Respiratory:  Positive for cough. Negative for shortness of breath.   Gastrointestinal:  Positive for diarrhea, nausea and vomiting.  Neurological:  Positive for headaches.    Social History   Tobacco Use  Smoking Status Never  Smokeless Tobacco Never        Objective:   BP Readings from Last 3 Encounters:  07/03/21 (!) 147/92  04/24/21 124/84  02/24/21 (!) 144/100   Wt Readings from Last 3 Encounters:  07/03/21 135 lb (61.2 kg)  04/24/21 135 lb (61.2 kg)  02/24/21 135 lb (61.2 kg)   BP (!) 147/92   Ht '5\' 4"'$  (1.626 m)   Wt 135 lb (61.2 kg)   BMI 23.17 kg/m   Physical Exam Constitutional:      Appearance: Normal appearance. She is not ill-appearing.  HENT:     Head: Normocephalic and atraumatic.     Right Ear: External ear normal.     Left Ear: External ear normal.  Eyes:     Conjunctiva/sclera:  Conjunctivae normal.  Pulmonary:     Effort: Pulmonary effort is normal. No respiratory distress.     Comments: Coughing occasionally Neurological:     Mental Status: She is alert. Mental status is at baseline.  Psychiatric:        Mood and Affect: Mood normal.        Behavior: Behavior normal.        Thought Content: Thought content normal.        Judgment: Judgment normal.            Assessment & Plan:   Problem List Items Addressed This Visit   None Visit Diagnoses     COVID-19 virus infection    -  Primary   Relevant Medications   molnupiravir EUA 200 mg CAPS   ondansetron (ZOFRAN) 4 MG tablet      Patient is at increased risk for developing severe covid due to age, HTN. They are eligible for anti-viral medication.   Discussed molniravir due to possible interaction with cartia XT.   Zofran for nausea  Medication sent to pharmacy  Reviewed ER and return precautions  Reviewed isolation guidelines.    Return if symptoms worsen or fail to improve.  Lesleigh Noe, MD

## 2021-07-03 NOTE — Telephone Encounter (Signed)
[facility] - Client TELEPHONE ADVICE RECORD [facility] Patient Name: [name] NE Gender: Female DOB: 08-Mar-1946 Age: 75 Y 39 M 10 D Return Phone Number: [id number] (Primary), [id number] (Secondary) Address: [place]/ State/ Zip: [place] [address] Client [facility] - Client Client Site [place] - Day Contact Type Call Who Is Calling Patient / Member / Family / Caregiver Call Type Triage / Clinical Caller Name Harrie Jeans Relationship To Patient Spouse Return Phone Number [phone] (Primary) Chief Complaint Headache Reason for Call Symptomatic / Request for [place] states she tested positive for Covid, having a headache, congestion, body aches and vomiting. Diarrhea. Translation No Nurse Assessment Nurse: Windle Guard, RN, Lesa Date/Time (Eastern Time): 07/03/2021 10:24:54 AM Confirm and document reason for call. If symptomatic, describe symptoms. ---Caller states she is tired, nauseous, cough and nasal congestion. She has tested positive for Covid Does the patient have any new or worsening symptoms? ---Yes Will a triage be completed? ---Yes Related visit to physician within the last 2 weeks? ---No Does the PT have any chronic conditions? (i.e. diabetes, asthma, this includes High risk factors for pregnancy, etc.) ---Yes List chronic conditions. ---HTN, pacemaker Is this a behavioral health or substance abuse call? ---No Guidelines Guideline Title Affirmed Question Affirmed Notes Nurse Date/Time (Eastern Time) COVID-19 - Diagnosed or Suspected [1] HIGH RISK for severe COVID complications (e.g., weak immune system, age > 62 years, obesity with BMI > 25, pregnant, chronic lung disease or other chronic medical condition) AND [2] Conner, RN, Lesa 07/03/2021 10:26:54 AM PLEASE NOTE: All timestamps contained within this  report are represented as [place] Standard Time. CONFIDENTIALTY NOTICE: This fax transmission is intended only for the addressee. It contains information that is legally privileged, confidential or otherwise protected from use or disclosure. If you are not the intended recipient, you are strictly prohibited from reviewing, disclosing, copying using or disseminating any of this information or taking any action in reliance on or regarding this information. If you have received this fax in error, please notify us immediately by telephone so that we can arrange for its return to [place]. Phone: [phone], Toll-Free: [phone], Fax: [phone] Page: 2 of 3 Call Id: [id number] Guidelines Guideline Title Affirmed Question Affirmed Notes Nurse Date/Time Eilene Ghazi Time) COVID symptoms (e.g., cough, fever) (Exceptions: Already seen by PCP and no new or worsening symptoms.) Disp. Time Eilene Ghazi Time) Disposition Final User 07/03/2021 10:31:41 AM Call PCP Now Yes Conner, RN, Lesa Caller Disagree/Comply Comply Caller Understands Yes PreDisposition Did not know what to do Care Advice Given Per Guideline CALL PCP NOW: * You need to discuss this with your doctor (or NP/PA). GENERAL CARE ADVICE FOR COVID-19 SYMPTOMS: * The symptoms are generally treated the same whether you have COVID-19, influenza or some other respiratory virus. * Cough: Use cough drops. * Feeling dehydrated: Drink extra liquids. If the air in your home is dry, use a humidifier. COUGH SYRUP WITH DEXTROMETHORPHAN - EXTRA NOTES AND WARNINGS: * Do not try to completely stop coughs that produce mucus and phlegm. * Coughing is helpful. It brings up the mucus from the lungs and helps prevent pneumonia. * STAY HOME A MINIMUM OF 5 DAYS: Home isolation is needed for at least 5 days after the symptoms started. Stay home from school or work if you are sick. Do NOT go to religious services, child care centers, shopping, or other public places. Do  NOT use public  transportation (e.g., bus, taxis, ride-sharing). Do NOT allow any visitors to your home. Leave the house only if you need to seek urgent medical care. COVID-19 - HOW TO PROTECT OTHERS - WHEN YOU ARE SICK WITH COVID-19: * WEAR A MASK FOR 10 DAYS: Wear a well-fitted mask for 10 full days any time you are around others inside your home or in public. Do not go to places where you are unable to wear a mask. FEVER MEDICINES: * For fevers above 101 F (38.3 C) take either acetaminophen or ibuprofen. * ACETAMINOPHEN REGULAR STRENGTH TYLENOL: Take 650 mg (two 325 mg pills) by mouth every 4-6 hours as needed. Each Regular Strength Tylenol pill has 325 mg of acetaminophen. The most you should take each day is 3,250 mg (10 pills a day). * IBUPROFEN (E.G., MOTRIN, ADVIL): Take 400 mg (two 200 mg pills) by mouth every 6 hours. The most you should take each day is 1,200 mg (six 200 mg pills), unless your doctor has told you to take more. FEVER MEDICINES - EXTRA NOTES AND WARNINGS: * Use the lowest amount of medicine that makes your fever better. * Before taking any medicine, read all the instructions on the package. * [place] HANDS OFTEN: Wash hands often with soap and water. After coughing or sneezing are important times. If soap and water are not available, use an alcohol-based hand sanitizer with at least 60% alcohol, covering all surfaces of your hands and rubbing them together until they feel dry. Avoid touching your eyes, nose, and mouth with unwashed hands. CALL BACK IF: * You become worse CARE ADVICE given per COVID-19 - DIAGNOSED OR SUSPECTED (Adult) guideline. Comments User: Baruch Goldmann Date/Time Eilene Ghazi Time): 07/03/2021 10:16:28 AM Transferred from the office. User: Starleen Blue, RN Date/Time Eilene Ghazi Time): 07/03/2021 10:38:14 AM

## 2021-07-03 NOTE — Telephone Encounter (Signed)
Lattie Haw RN with access nurse said pt tested + covid on 07/02/21 and transferred pt to office; pt started with body aches, chest congestion, H/A, prod cough with beige phlegm, and irritated throat and runny nose on 07/01/21. No SOB,CP or fever and no wheezing. Pt wants to know if should take antiviral med for covid. Pt has video visit with Dr Einar Pheasant today at 11 AM. UC & ED precautions given and  pt voiced understanding. Sending note to Dr Damita Dunnings as PCP and Dr Einar Pheasant. Will add access nurse note when available.

## 2021-07-03 NOTE — Telephone Encounter (Signed)
Pt was seen by Dr. Einar Pheasant by [facility] today.

## 2021-07-03 NOTE — Telephone Encounter (Signed)
[facility] - Client TELEPHONE ADVICE RECORD [facility] Patient Name: [name] Gender: Female DOB: 1946/03/31 Age: 75 Y 32 M 9 D Return Phone Number: [id number] (Primary), [id number] (Secondary), [id number] (Alternate) Address: City/ State/ Zip: [place] [address] Client [facility] - Client Client Site [place] Physician Renford Dills - MD Contact Type Call Who Is Calling Patient / Member / Family / Caregiver Call Type Triage / Clinical Caller Name Harrie Jeans Relationship To Patient Spouse Return Phone Number [phone] (Primary) Chief Complaint Weakness, Generalized Reason for Call Symptomatic / Request for Health Information Initial Comment Caller's wife is covid positive. Caller requesting medication. She has cough, nausea, and body aches. Translation No Nurse Assessment Nurse: Laveda Abbe, RN, Marjory Lies Date/Time Eilene Ghazi Time): 07/02/2021 12:18:43 PM Confirm and document reason for call. If symptomatic, describe symptoms. ---Caller's wife is covid positive This morning. Pt started having Symptoms on Friday night. Caller requesting medication. She has cough, nausea, and body aches. 99.77F highest temp. now 98.77F Does the patient have any new or worsening symptoms? ---Yes Will a triage be completed? ---Yes Related visit to physician within the last 2 weeks? ---No Does the PT have any chronic conditions? (i.e. diabetes, asthma, this includes High risk factors for pregnancy, etc.) ---Yes List chronic conditions. ---pacemaker hypertension low tolerance for meds Is this a behavioral health or substance abuse call? ---No Guidelines Guideline Title Affirmed Question Affirmed Notes Nurse Date/Time (Eastern Time) COVID-19 - Diagnosed or Suspected [1] HIGH RISK for severe COVID complications (e.g., weak immune system, age > 5 years, obesity with BMI > 25, pregnant,  chronic Hassan Buckler 07/02/2021 12:22:17 PM PLEASE NOTE: All timestamps contained within this report are represented as [place] Standard Time. CONFIDENTIALTY NOTICE: This fax transmission is intended only for the addressee. It contains information that is legally privileged, confidential or otherwise protected from use or disclosure. If you are not the intended recipient, you are strictly prohibited from reviewing, disclosing, copying using or disseminating any of this information or taking any action in reliance on or regarding this information. If you have received this fax in error, please notify us immediately by telephone so that we can arrange for its return to [place]. Phone: [phone], Toll-Free: [phone], Fax: [phone] Page: 2 of 3 Call Id: [id number] Guidelines Guideline Title Affirmed Question Affirmed Notes Nurse Date/Time Eilene Ghazi Time) lung disease or other chronic medical condition) AND [2] COVID symptoms (e.g., cough, fever) (Exceptions: Already seen by PCP and no new or worsening symptoms.) Disp. Time Eilene Ghazi Time) Disposition Final User 07/02/2021 12:33:51 PM Called On-Call Provider Laveda Abbe, RN, Marjory Lies 07/02/2021 12:28:55 PM Call PCP Now Yes Laveda Abbe, RN, Carlyon Shadow Disagree/Comply Comply Caller Understands Yes PreDisposition Did not know what to do Care Advice Given Per Guideline CALL BACK IF: * You become worse COVID-19 - HOW TO PROTECT OTHERS - WHEN YOU ARE SICK WITH COVID-19: * STAY HOME A MINIMUM OF 5 DAYS: Home isolation is needed for at least 5 days after the symptoms started. Stay home from school or work if you are sick. Do NOT go to religious services, child care centers, shopping, or other public places. Do NOT use public transportation (e.g., bus, taxis, ride-sharing). Do NOT allow any visitors to your home. Leave the house only if you need to seek urgent medical care. * WEAR A MASK FOR 10 DAYS: Wear a well-fitted mask for 10 full days any time you  are around others inside your home  or in public. Do not go to places where you are unable to wear a mask. * [place] HANDS OFTEN: Wash hands often with soap and water. After coughing or sneezing are important times. If soap and water are not available, use an alcoholbased hand sanitizer with at least 60% alcohol, covering all surfaces of your hands and rubbing them together until they feel dry. Avoid touching your eyes, nose, and mouth with unwashed hands. * CALL AHEAD IF MEDICAL CARE IS NEEDED: If you have a medical appointment, call your doctor's office and tell them you have or may have COVID-19. This will help the office protect themselves and other patients. They will give you directions. Comments User: Gayla Doss, RN Date/Time Eilene Ghazi Time): 07/02/2021 12:24:39 PM O2 93% HR 66 User: Gayla Doss, RN Date/Time Eilene Ghazi Time): 07/02/2021 12:26:03 PM O2 now 95% HR 66 Referrals REFERRED TO PCP OFFICE PLEASE NOTE: All timestamps contained within this report are represented as [place] Standard Time. CONFIDENTIALTY NOTICE: This fax transmission is intended only for the addressee. It contains information that is legally privileged, confidential or otherwise protected from use or disclosure. If you are not the intended recipient, you are strictly prohibited from reviewing, disclosing, copying using or disseminating any of this information or taking any action in reliance on or regarding this information. If you have received this fax in error, please notify us immediately by telephone so that we can arrange for its return to [place]. Phone: [phone], Toll-Free: [phone], Fax: [phone] Page: 3 of 3 Call Id: [id number] Paging DoctorName Phone DateTime Result/ Outcome Message Type Notes Howard Pouch [id number] 07/02/2021 12:33:51 PM Called On Call Provider - Reached Doctor Paged [place], Joseph Art 07/02/2021 12:34:08 PM

## 2021-07-03 NOTE — Telephone Encounter (Signed)
See note from today

## 2021-07-10 ENCOUNTER — Other Ambulatory Visit: Payer: Self-pay | Admitting: Family Medicine

## 2021-07-11 NOTE — Telephone Encounter (Signed)
Refill request for Lorazepam 1 mg tablets  LOV - 04/24/21 Next OV - not scheduled Last refill - 03/31/21 #120/2

## 2021-07-12 NOTE — Telephone Encounter (Signed)
  Encourage patient to contact the pharmacy for refills or they can request refills through [facility]:  Please schedule appointment if longer than 1 year  NEXT APPOINTMENT DATE:  MEDICATION: LORazepam (ATIVAN) 1 MG tablet  Is the patient out of medication? yes  PHARMACY: [facility]  Let patient know to contact pharmacy at the end of the day to make sure medication is ready.  Please notify patient to allow 48-72 hours to process  CLINICAL FILLS OUT ALL BELOW:   LAST REFILL:  QTY:  REFILL DATE:    OTHER COMMENTS:    Okay for refill?  Please advise

## 2021-07-12 NOTE — Telephone Encounter (Signed)
Sent. Thanks.   

## 2021-07-24 DIAGNOSIS — H34811 Central retinal vein occlusion, right eye, with macular edema: Secondary | ICD-10-CM | POA: Diagnosis not present

## 2021-07-26 DIAGNOSIS — M47812 Spondylosis without myelopathy or radiculopathy, cervical region: Secondary | ICD-10-CM | POA: Diagnosis not present

## 2021-07-27 DIAGNOSIS — H3582 Retinal ischemia: Secondary | ICD-10-CM | POA: Diagnosis not present

## 2021-07-27 DIAGNOSIS — H35011 Changes in retinal vascular appearance, right eye: Secondary | ICD-10-CM | POA: Diagnosis not present

## 2021-07-27 DIAGNOSIS — H25811 Combined forms of age-related cataract, right eye: Secondary | ICD-10-CM | POA: Diagnosis not present

## 2021-07-27 DIAGNOSIS — H34811 Central retinal vein occlusion, right eye, with macular edema: Secondary | ICD-10-CM | POA: Diagnosis not present

## 2021-07-27 DIAGNOSIS — H35033 Hypertensive retinopathy, bilateral: Secondary | ICD-10-CM | POA: Diagnosis not present

## 2021-08-01 DIAGNOSIS — H2513 Age-related nuclear cataract, bilateral: Secondary | ICD-10-CM | POA: Diagnosis not present

## 2021-08-02 ENCOUNTER — Ambulatory Visit (INDEPENDENT_AMBULATORY_CARE_PROVIDER_SITE_OTHER): Payer: Medicare Other

## 2021-08-02 DIAGNOSIS — I495 Sick sinus syndrome: Secondary | ICD-10-CM

## 2021-08-02 LAB — CUP PACEART REMOTE DEVICE CHECK
Battery Impedance: 371 Ohm
Battery Remaining Longevity: 108 mo
Battery Voltage: 2.78 V
Brady Statistic AP VP Percent: 0 %
Brady Statistic AP VS Percent: 67 %
Brady Statistic AS VP Percent: 0 %
Brady Statistic AS VS Percent: 33 %
Date Time Interrogation Session: 20220831125228
Implantable Lead Implant Date: 20060620
Implantable Lead Implant Date: 20060620
Implantable Lead Location: 753859
Implantable Lead Location: 753860
Implantable Lead Model: 5076
Implantable Lead Model: 5076
Implantable Pulse Generator Implant Date: 20160219
Lead Channel Impedance Value: 621 Ohm
Lead Channel Impedance Value: 757 Ohm
Lead Channel Pacing Threshold Amplitude: 0.5 V
Lead Channel Pacing Threshold Amplitude: 0.75 V
Lead Channel Pacing Threshold Pulse Width: 0.4 ms
Lead Channel Pacing Threshold Pulse Width: 0.4 ms
Lead Channel Setting Pacing Amplitude: 2 V
Lead Channel Setting Pacing Amplitude: 2.5 V
Lead Channel Setting Pacing Pulse Width: 0.4 ms
Lead Channel Setting Sensing Sensitivity: 5.6 mV

## 2021-08-03 DIAGNOSIS — M47812 Spondylosis without myelopathy or radiculopathy, cervical region: Secondary | ICD-10-CM | POA: Diagnosis not present

## 2021-08-11 DIAGNOSIS — M47812 Spondylosis without myelopathy or radiculopathy, cervical region: Secondary | ICD-10-CM | POA: Diagnosis not present

## 2021-08-15 NOTE — Progress Notes (Signed)
Remote pacemaker transmission.   

## 2021-08-28 ENCOUNTER — Other Ambulatory Visit: Payer: Self-pay | Admitting: Family Medicine

## 2021-08-28 DIAGNOSIS — L570 Actinic keratosis: Secondary | ICD-10-CM | POA: Diagnosis not present

## 2021-08-28 DIAGNOSIS — D225 Melanocytic nevi of trunk: Secondary | ICD-10-CM | POA: Diagnosis not present

## 2021-08-28 DIAGNOSIS — L720 Epidermal cyst: Secondary | ICD-10-CM | POA: Diagnosis not present

## 2021-08-28 DIAGNOSIS — Z1321 Encounter for screening for nutritional disorder: Secondary | ICD-10-CM

## 2021-08-28 DIAGNOSIS — L820 Inflamed seborrheic keratosis: Secondary | ICD-10-CM | POA: Diagnosis not present

## 2021-08-28 DIAGNOSIS — L249 Irritant contact dermatitis, unspecified cause: Secondary | ICD-10-CM | POA: Diagnosis not present

## 2021-08-28 DIAGNOSIS — L821 Other seborrheic keratosis: Secondary | ICD-10-CM | POA: Diagnosis not present

## 2021-08-28 DIAGNOSIS — I119 Hypertensive heart disease without heart failure: Secondary | ICD-10-CM

## 2021-08-28 DIAGNOSIS — D2262 Melanocytic nevi of left upper limb, including shoulder: Secondary | ICD-10-CM | POA: Diagnosis not present

## 2021-08-28 DIAGNOSIS — D2261 Melanocytic nevi of right upper limb, including shoulder: Secondary | ICD-10-CM | POA: Diagnosis not present

## 2021-08-28 DIAGNOSIS — Z85828 Personal history of other malignant neoplasm of skin: Secondary | ICD-10-CM | POA: Diagnosis not present

## 2021-08-30 ENCOUNTER — Other Ambulatory Visit: Payer: Self-pay

## 2021-08-30 ENCOUNTER — Other Ambulatory Visit (INDEPENDENT_AMBULATORY_CARE_PROVIDER_SITE_OTHER): Payer: Medicare Other

## 2021-08-30 DIAGNOSIS — I119 Hypertensive heart disease without heart failure: Secondary | ICD-10-CM

## 2021-08-30 DIAGNOSIS — Z1321 Encounter for screening for nutritional disorder: Secondary | ICD-10-CM | POA: Diagnosis not present

## 2021-08-30 LAB — COMPREHENSIVE METABOLIC PANEL
ALT: 14 U/L (ref 0–35)
AST: 15 U/L (ref 0–37)
Albumin: 4.4 g/dL (ref 3.5–5.2)
Alkaline Phosphatase: 51 U/L (ref 39–117)
BUN: 16 mg/dL (ref 6–23)
CO2: 29 mEq/L (ref 19–32)
Calcium: 9.2 mg/dL (ref 8.4–10.5)
Chloride: 99 mEq/L (ref 96–112)
Creatinine, Ser: 0.95 mg/dL (ref 0.40–1.20)
GFR: 59.33 mL/min — ABNORMAL LOW (ref >60.00)
Glucose, Bld: 96 mg/dL (ref 70–99)
Potassium: 4.2 mEq/L (ref 3.5–5.1)
Sodium: 137 mEq/L (ref 135–145)
Total Bilirubin: 0.7 mg/dL (ref 0.2–1.2)
Total Protein: 7 g/dL (ref 6.0–8.3)

## 2021-08-30 LAB — LIPID PANEL
Cholesterol: 249 mg/dL — ABNORMAL HIGH (ref 0–200)
HDL: 59.3 mg/dL (ref >39.00)
LDL Cholesterol: 163 mg/dL — ABNORMAL HIGH (ref 0–99)
NonHDL: 189.21
Total CHOL/HDL Ratio: 4
Triglycerides: 131 mg/dL (ref 0.0–149.0)
VLDL: 26.2 mg/dL (ref 0.0–40.0)

## 2021-08-30 LAB — VITAMIN D 25 HYDROXY (VIT D DEFICIENCY, FRACTURES): VITD: 18.41 ng/mL — ABNORMAL LOW (ref 30.00–100.00)

## 2021-08-30 LAB — TSH: TSH: 2.94 u[IU]/mL (ref 0.35–5.50)

## 2021-09-04 ENCOUNTER — Other Ambulatory Visit: Payer: Self-pay

## 2021-09-04 ENCOUNTER — Ambulatory Visit (INDEPENDENT_AMBULATORY_CARE_PROVIDER_SITE_OTHER): Payer: Medicare Other | Admitting: Family Medicine

## 2021-09-04 ENCOUNTER — Encounter: Payer: Self-pay | Admitting: Family Medicine

## 2021-09-04 VITALS — BP 120/80 | HR 89 | Temp 98.0°F | Ht 64.0 in | Wt 136.0 lb

## 2021-09-04 DIAGNOSIS — G4733 Obstructive sleep apnea (adult) (pediatric): Secondary | ICD-10-CM

## 2021-09-04 DIAGNOSIS — Z0000 Encounter for general adult medical examination without abnormal findings: Secondary | ICD-10-CM | POA: Diagnosis not present

## 2021-09-04 DIAGNOSIS — R7989 Other specified abnormal findings of blood chemistry: Secondary | ICD-10-CM

## 2021-09-04 DIAGNOSIS — F419 Anxiety disorder, unspecified: Secondary | ICD-10-CM

## 2021-09-04 DIAGNOSIS — E559 Vitamin D deficiency, unspecified: Secondary | ICD-10-CM | POA: Diagnosis not present

## 2021-09-04 DIAGNOSIS — Z7189 Other specified counseling: Secondary | ICD-10-CM

## 2021-09-04 MED ORDER — VITAMIN D3 25 MCG (1000 UT) PO CAPS: 1000.0000 [IU] | ORAL_CAPSULE | Freq: Every day | ORAL | Status: DC

## 2021-09-04 MED ORDER — FAMOTIDINE 20 MG PO TABS: 20.0000 mg | ORAL_TABLET | Freq: Two times a day (BID) | ORAL | 3 refills | Status: DC

## 2021-09-04 NOTE — Patient Instructions (Addendum)
Add on 1000 IU vit D daily.  Recheck labs in about 3 months.  We'll go from there. Flu shot then possible.  I'll await the notes from the spine clinic.  Take care.  Glad to see you.

## 2021-09-04 NOTE — Progress Notes (Signed)
This visit occurred during the SARS-CoV-2 public health emergency.  Safety protocols were in place, including screening questions prior to the visit, additional usage of staff PPE, and extensive cleaning of exam room while observing appropriate contact time as indicated for disinfecting solutions.  I have personally reviewed the Medicare Annual Wellness questionnaire and have noted 1. The patient's medical and social history 2. Their use of alcohol, tobacco or illicit drugs 3. Their current medications and supplements 4. The patient's functional ability including ADL's, fall risks, home safety risks and hearing or visual             impairment. 5. Diet and physical activities 6. Evidence for depression or mood disorders  The patients weight, height, BMI have been recorded in the chart and visual acuity is per eye clinic.  I have made referrals, counseling and provided education to the patient based review of the above and I have provided the pt with a written personalized care plan for preventive services.  Provider list updated- see scanned forms.  Routine anticipatory guidance given to patient.  See health maintenance. The possibility exists that previously documented standard health maintenance information may have been brought forward from a previous encounter into this note.  If needed, that same information has been updated to reflect the current situation based on today's encounter.    Flu deferred today given pending neck injection but will be done this fall. Shingles discussed with patient.  Zostavax previously done. PNA up-to-date Tetanus 2019 COVID-vaccine previously done. Colonoscopy 2019 Breast cancer screening 2022 DXA deferred given low vitamin D level Advance directive-husband designated if patient were incapacitated. Cognitive function addressed- see scanned forms- and if abnormal then additional documentation follows.   In addition to [facility] Wellness, follow up visit  for the below conditions:  D/w pt about mouthpiece adjustment for sleep apnea.  Low vit D.  Discussed with patient about lab and also replacement.  See after visit summary.  Still on BZD at baseline.  No ADE on med.  Longstanding h/o anxiety.  Med helps.    PMH and SH reviewed  Meds, vitals, and allergies reviewed.   ROS: Per HPI.  Unless specifically indicated otherwise in HPI, the patient denies:  General: fever. Eyes: acute vision changes ENT: sore throat Cardiovascular: chest pain Respiratory: SOB GI: vomiting GU: dysuria Musculoskeletal: acute back pain Derm: acute rash Neuro: acute motor dysfunction Psych: worsening mood Endocrine: polydipsia Heme: bleeding Allergy: hayfever  GEN: nad, alert and oriented HEENT: ncat NECK: supple w/o LA CV: rrr. PULM: ctab, no inc wob ABD: soft, +bs EXT: no edema SKIN: no acute rash

## 2021-09-07 DIAGNOSIS — M47812 Spondylosis without myelopathy or radiculopathy, cervical region: Secondary | ICD-10-CM | POA: Diagnosis not present

## 2021-09-10 DIAGNOSIS — E559 Vitamin D deficiency, unspecified: Secondary | ICD-10-CM | POA: Insufficient documentation

## 2021-09-10 NOTE — Assessment & Plan Note (Signed)
Advance directive- husband designated if patient were incapacitated.  

## 2021-09-10 NOTE — Assessment & Plan Note (Signed)
Add on 1000 IU vit D daily.  Recheck labs in about 3 months.  We'll go from there.

## 2021-09-10 NOTE — Assessment & Plan Note (Signed)
Flu deferred today given pending neck injection but will be done this fall. Shingles discussed with patient.  Zostavax previously done. PNA up-to-date Tetanus 2019 COVID-vaccine previously done. Colonoscopy 2019 Breast cancer screening 2022 DXA deferred given low vitamin D level Advance directive-husband designated if patient were incapacitated. Cognitive function addressed- see scanned forms- and if abnormal then additional documentation follows.

## 2021-09-10 NOTE — Assessment & Plan Note (Signed)
Still on BZD at baseline.  No ADE on med.  Longstanding h/o anxiety.  Med helps.  Continue as is.

## 2021-09-10 NOTE — Assessment & Plan Note (Signed)
Discussed with patient about mouthpiece adjustment per outside clinic.

## 2021-09-25 DIAGNOSIS — H34811 Central retinal vein occlusion, right eye, with macular edema: Secondary | ICD-10-CM | POA: Diagnosis not present

## 2021-10-05 DIAGNOSIS — M5481 Occipital neuralgia: Secondary | ICD-10-CM | POA: Diagnosis not present

## 2021-10-05 DIAGNOSIS — M47812 Spondylosis without myelopathy or radiculopathy, cervical region: Secondary | ICD-10-CM | POA: Diagnosis not present

## 2021-10-09 ENCOUNTER — Ambulatory Visit: Payer: Medicare Other | Attending: Internal Medicine

## 2021-10-09 ENCOUNTER — Other Ambulatory Visit: Payer: Self-pay | Admitting: Neurological Surgery

## 2021-10-09 DIAGNOSIS — M47812 Spondylosis without myelopathy or radiculopathy, cervical region: Secondary | ICD-10-CM

## 2021-10-09 DIAGNOSIS — Z23 Encounter for immunization: Secondary | ICD-10-CM

## 2021-10-09 NOTE — Progress Notes (Signed)
   Covid-19 Vaccination Clinic  Name:  [name]    MRN: [id number] DOB: 1946/03/12  10/09/2021  [name] was observed post Covid-19 immunization for 15 minutes without incident. She was provided with Vaccine Information Sheet and instruction to access the V-Safe system.   [name] was instructed to call 911 with any severe reactions post vaccine: Difficulty breathing  Swelling of face and throat  A fast heartbeat  A bad rash all over body  Dizziness and weakness   Immunizations Administered     Name Date Dose VIS Date Route   Pfizer Covid-19 Vaccine Bivalent Booster 10/09/2021 12:14 PM 0.3 mL 08/02/2021 Intramuscular   Manufacturer: [place]   Lot: [id number]   [place]: [phone]

## 2021-10-12 ENCOUNTER — Telehealth: Payer: Self-pay | Admitting: Family Medicine

## 2021-10-12 NOTE — Progress Notes (Signed)
  Chronic Care Management   Note  10/12/2021 Name: [name] MRN: [id number] DOB: 09/07/1946  [name] is a 75 y.o. year old female who is a primary care patient of Tonia Ghent, MD. I reached out to Johnnye Sima by phone today in response to a referral sent by [name] PCP, Tonia Ghent, MD.   Ms. Corona was given information about Chronic Care Management services today including:  CCM service includes personalized support from designated clinical staff supervised by her physician, including individualized plan of care and coordination with other care providers 24/7 contact phone numbers for assistance for urgent and routine care needs. Service will only be billed when office clinical staff spend 20 minutes or more in a month to coordinate care. Only one practitioner may furnish and bill the service in a calendar month. The patient may stop CCM services at any time (effective at the end of the month) by phone call to the office staff.   [name]/SPOUSE verbally agreed to assistance and services provided by embedded care coordination/care management team today.  Follow up plan:   [place] Secretary/administrator

## 2021-10-16 ENCOUNTER — Other Ambulatory Visit: Payer: Self-pay | Admitting: Family Medicine

## 2021-10-17 NOTE — Telephone Encounter (Signed)
Refill request for LORazepam (ATIVAN) 1 MG tablet  LOV - 09/04/21 Next OV - not scheduled Last refill - 07/12/21 #120/2

## 2021-10-18 NOTE — Telephone Encounter (Signed)
Sent. Thanks.   

## 2021-10-20 ENCOUNTER — Ambulatory Visit
Admission: RE | Admit: 2021-10-20 | Discharge: 2021-10-20 | Disposition: A | Discharge status: Home / Self Care | Payer: Medicare Other | Source: Ambulatory Visit | Attending: Neurological Surgery | Admitting: Neurological Surgery

## 2021-10-20 DIAGNOSIS — M47812 Spondylosis without myelopathy or radiculopathy, cervical region: Secondary | ICD-10-CM

## 2021-10-20 DIAGNOSIS — M4802 Spinal stenosis, cervical region: Secondary | ICD-10-CM | POA: Diagnosis not present

## 2021-10-20 DIAGNOSIS — Z8679 Personal history of other diseases of the circulatory system: Secondary | ICD-10-CM | POA: Diagnosis not present

## 2021-10-20 DIAGNOSIS — Z87828 Personal history of other (healed) physical injury and trauma: Secondary | ICD-10-CM | POA: Diagnosis not present

## 2021-10-20 DIAGNOSIS — M4312 Spondylolisthesis, cervical region: Secondary | ICD-10-CM | POA: Diagnosis not present

## 2021-10-24 ENCOUNTER — Other Ambulatory Visit (HOSPITAL_BASED_OUTPATIENT_CLINIC_OR_DEPARTMENT_OTHER): Payer: Self-pay

## 2021-10-24 MED ORDER — PFIZER COVID-19 VAC BIVALENT 30 MCG/0.3ML IM SUSP
INTRAMUSCULAR | 0 refills | Status: DC
  Filled 2021-10-24: qty 0.3, 1d supply, fill #0

## 2021-11-01 ENCOUNTER — Ambulatory Visit (INDEPENDENT_AMBULATORY_CARE_PROVIDER_SITE_OTHER): Payer: Medicare Other

## 2021-11-01 DIAGNOSIS — I495 Sick sinus syndrome: Secondary | ICD-10-CM | POA: Diagnosis not present

## 2021-11-01 DIAGNOSIS — I10 Essential (primary) hypertension: Secondary | ICD-10-CM | POA: Diagnosis not present

## 2021-11-01 DIAGNOSIS — M5481 Occipital neuralgia: Secondary | ICD-10-CM | POA: Diagnosis not present

## 2021-11-01 LAB — CUP PACEART REMOTE DEVICE CHECK
Battery Impedance: 418 Ohm
Battery Remaining Longevity: 103 mo
Battery Voltage: 2.78 V
Brady Statistic AP VP Percent: 0 %
Brady Statistic AP VS Percent: 65 %
Brady Statistic AS VP Percent: 0 %
Brady Statistic AS VS Percent: 34 %
Date Time Interrogation Session: 20221130083507
Implantable Lead Implant Date: 20060620
Implantable Lead Implant Date: 20060620
Implantable Lead Location: 753859
Implantable Lead Location: 753860
Implantable Lead Model: 5076
Implantable Lead Model: 5076
Implantable Pulse Generator Implant Date: 20160219
Lead Channel Impedance Value: 688 Ohm
Lead Channel Impedance Value: 744 Ohm
Lead Channel Pacing Threshold Amplitude: 0.625 V
Lead Channel Pacing Threshold Amplitude: 0.75 V
Lead Channel Pacing Threshold Pulse Width: 0.4 ms
Lead Channel Pacing Threshold Pulse Width: 0.4 ms
Lead Channel Setting Pacing Amplitude: 2 V
Lead Channel Setting Pacing Amplitude: 2.5 V
Lead Channel Setting Pacing Pulse Width: 0.4 ms
Lead Channel Setting Sensing Sensitivity: 5.6 mV

## 2021-11-02 ENCOUNTER — Telehealth: Payer: Self-pay

## 2021-11-02 NOTE — Chronic Care Management (AMB) (Signed)
Chronic Care Management Pharmacy Assistant   Name: [name]  MRN: [id number] DOB: 02-20-1946  [name] is an 74 y.o. year old female who presents for his initial CCM visit with the clinical pharmacist.  Reason for Encounter: Initial Questions   Conditions to be addressed/monitored: HTN and HLD   Recent office visits:  09/04/21-PCP-Graham Duncan,MD-Patient presented for AWV. Discussed vaccines, screenings,labs ordered( vitamin D abnormal) add on 1000 IU vit D daily. Recheck labs in about 3 months, flu shot then as well. 07/03/21-Family Medicine-Jessica Cody,MD-Telemedicine-Patient presented for positive covid test.Started Molnupiravir 200mg  ,Zofran 4mg  discussed isolation guidelines.  Recent consult visits:  11/01/21-[facility]-Thomas Ostergard,MD-Patient presented for follow up neck pain, no medication changes 10/05/21-[facility]-Thomas Ostergard,MD-Patient presented for follow up cervical facet injections.No medication changes 09/07/21-[facility]-Dave Eichman,MD- Patient presented for neck pain,no medication changes   Hospital visits:  None in previous 6 months  Medications: Outpatient Encounter Medications as of 11/02/2021  Medication Sig   acetaminophen (TYLENOL) 500 MG tablet Take 1,000 mg by mouth every 6 (six) hours as needed (pain).   Carboxymethylcell-Hypromellose 0.25-0.3 % GEL Place 1 application into both eyes at bedtime.    CARTIA XT 120 MG 24 hr capsule Take 1 capsule by mouth once daily   Cholecalciferol (VITAMIN D3) 25 MCG (1000 UT) CAPS Take 1 capsule (1,000 Units total) by mouth daily.   COVID-19 mRNA bivalent vaccine, Pfizer, (PFIZER COVID-19 VAC BIVALENT) injection Inject into the muscle.   famotidine (PEPCID) 20 MG tablet Take 1 tablet (20 mg total) by mouth 2 (two) times daily.   LORazepam (ATIVAN) 1 MG tablet TAKE 1 TABLET BY MOUTH EVERY 6 HOURS AS NEEDED   nitroGLYCERIN (NITROSTAT) 0.4 MG SL tablet Place 1 tablet  (0.4 mg total) under the tongue every 5 (five) minutes as needed. For up to 3 doses   Polyethyl Glycol-Propyl Glycol (SYSTANE OP) Place 1 drop into both eyes daily as needed (dry eyes).    terconazole (TERAZOL 7) 0.4 % vaginal cream Place 1 applicator vaginally at bedtime. For 7 nights   No facility-administered encounter medications on file as of 11/02/2021.     No results found for: HGBA1C, MICROALBUR   BP Readings from Last 3 Encounters:  09/04/21 120/80  07/03/21 (!) 147/92  04/24/21 124/84    Patient contacted to review initial questions prior to visit with [name].  Have you seen any other providers since your last visit with PCP? Yes  10/05/21,11/01/21- Neurosurgery - no medication changes   Any changes in your medications or health? No  Any side effects from any medications? No  Do you have an symptoms or problems not managed by your medications? No  Any concerns about your health right now? No  Has your provider asked that you check blood pressure, blood sugar, or follow special diet at home? Yes  The patient states she has a BP monitor at home.   Do you get any type of exercise on a regular basis? No -no formal exercise  Can you think of a goal you would like to reach for your health? No  Do you have any problems getting your medications? No   Is there anything that you would like to discuss during the appointment? No   Spoke with patient and reminded them to have all medications, supplements and any blood glucose and blood pressure readings available for review with pharmacist, at their telephone visit on 11/06/21 at 3:00pm.   Star Rating Drugs:  Medication:  Last  Fill: Day Supply No star medications identified  Cartia XT 120mg  08/21/21 90 Nitroglycerin 0.4mg  -  -     Care Gaps: Annual wellness visit in last year? Yes Most Recent BP reading:144/87  11/01/21  Last eye exam / retinopathy screening: Patient sees Retina specialist every 10  weeks   Marjo Bicker, CPP notified  Avel Sensor, [place] Assistant [phone]  Total time spent for month CPA: 40 min.s

## 2021-11-06 ENCOUNTER — Other Ambulatory Visit: Payer: Self-pay

## 2021-11-06 ENCOUNTER — Ambulatory Visit: Payer: Medicare Other | Admitting: Pharmacist

## 2021-11-06 DIAGNOSIS — E559 Vitamin D deficiency, unspecified: Secondary | ICD-10-CM

## 2021-11-06 DIAGNOSIS — F419 Anxiety disorder, unspecified: Secondary | ICD-10-CM

## 2021-11-06 DIAGNOSIS — E7800 Pure hypercholesterolemia, unspecified: Secondary | ICD-10-CM

## 2021-11-06 DIAGNOSIS — K219 Gastro-esophageal reflux disease without esophagitis: Secondary | ICD-10-CM

## 2021-11-06 DIAGNOSIS — I119 Hypertensive heart disease without heart failure: Secondary | ICD-10-CM

## 2021-11-06 NOTE — Progress Notes (Signed)
Chronic Care Management Pharmacy Note  11/08/2021 Name:  [name] MRN:  [id number] DOB:  1946/02/12  Summary: -Pt is not checking BP at home; most recent BP in office visit was 144/47 -Pt has not yet started Vitamin D supplement due to fear of side effects. Counseled extensively on consequences of Vitamin D deficiency  Recommendations/Changes made from today's visit: -Advised pt to check BP at home daily for several weeks; consider medication adjustment if average >140/90 -Advised to start Vitamin D 1000 IU daily (previously recommended per PCP)  Plan: -[place] will call patient 1 month for BP readings -Pharmacist follow up televisit scheduled for 3 months   Subjective: [name] is an 75 y.o. year old female who is a primary patient of Damita Dunnings, Elveria Rising, MD.  The CCM team was consulted for assistance with disease management and care coordination needs.    Engaged with patient by telephone for initial visit in response to provider referral for pharmacy case management and/or care coordination services.   Consent to Services:  The patient was given the following information about Chronic Care Management services today, agreed to services, and gave verbal consent: 1. CCM service includes personalized support from designated clinical staff supervised by the primary care provider, including individualized plan of care and coordination with other care providers 2. 24/7 contact phone numbers for assistance for urgent and routine care needs. 3. Service will only be billed when office clinical staff spend 20 minutes or more in a month to coordinate care. 4. Only one practitioner may furnish and bill the service in a calendar month. 5.The patient may stop CCM services at any time (effective at the end of the month) by phone call to the office staff. 6. The patient will be responsible for cost sharing (co-pay) of up to 20% of the service fee (after annual deductible is met).  Patient agreed to services and consent obtained.  Patient Care Team: Tonia Ghent, MD as PCP - General (Family Medicine) Burnell Blanks, MD as PCP - Cardiology (Cardiology) Darlin Coco, MD (Cardiology) [name], MD as Attending Physician (Cardiology) Deneise Lever, MD (Pulmonary Disease) Vickey Huger, MD (Orthopedic Surgery) Burnell Blanks, MD as Consulting Physician (Cardiology) Jarome Matin, MD as Consulting Physician (Dermatology) Phineas Real, Belinda Block, MD (Inactive) as Consulting Physician (Gynecology) Luberta Mutter, MD as Consulting Physician (Ophthalmology) Charlton Haws, [facility] as Pharmacist (Pharmacist)  Recent office visits: 09/04/21-PCP-Graham Duncan,MD-Patient presented for AWV. Discussed vaccines, screenings,labs ordered( vitamin D abnormal) start Vitamin D 1000 IU daily. Recheck labs in about 3 months, flu shot then as well.  07/03/21-Family Medicine-Jessica Cody,MD-Telemedicine-Patient presented for positive covid test.Started Molnupiravir 200mg  ,Zofran 4mg  discussed isolation guidelines.  Recent consult visits: 11/01/21-[facility]-Thomas Ostergard,MD-Patient presented for follow up neck pain, no medication changes  10/05/21-[facility]-Thomas Ostergard,MD-Patient presented for follow up cervical facet injections.No medication changes  09/07/21-[facility]-Dave Eichman,MD- Patient presented for neck pain,no medication changes  Hospital visits: None in previous 6 months   Objective:  Lab Results  Component Value Date   CREATININE 0.95 08/30/2021   BUN 16 08/30/2021   GFR 59.33 (L) 08/30/2021   GFRNONAA >60 04/15/2020   GFRAA >60 04/15/2020   NA 137 08/30/2021   K 4.2 08/30/2021   CALCIUM 9.2 08/30/2021   CO2 29 08/30/2021   GLUCOSE 96 08/30/2021    Lab Results  Component Value Date/Time   GFR 59.33 (L) 08/30/2021 09:34 AM   GFR 64.32 04/24/2021 03:55 PM    Last  diabetic Eye exam: No  results found for: HMDIABEYEEXA  Last diabetic Foot exam: No results found for: HMDIABFOOTEX   Lab Results  Component Value Date   CHOL 249 (H) 08/30/2021   HDL 59.30 08/30/2021   LDLCALC 163 (H) 08/30/2021   LDLDIRECT 146.6 01/25/2014   TRIG 131.0 08/30/2021   CHOLHDL 4 08/30/2021    Hepatic Function Latest Ref Rng & Units 08/30/2021 04/24/2021 06/28/2020  Total Protein 6.0 - 8.3 g/dL 7.0 6.8 6.8  Albumin 3.5 - 5.2 g/dL 4.4 4.4 4.3  AST 0 - 37 U/L $Remo'15 13 16  'TkSBg$ ALT 0 - 35 U/L $Remo'14 11 13  'HyQWu$ Alk Phosphatase 39 - 117 U/L 51 49 49  Total Bilirubin 0.2 - 1.2 mg/dL 0.7 0.4 0.6  Bilirubin, Direct <=0.2 mg/dL - - -    Lab Results  Component Value Date/Time   TSH 2.94 08/30/2021 09:34 AM   TSH 2.30 04/24/2021 03:55 PM   FREET4 0.97 05/08/2013 09:32 AM    CBC Latest Ref Rng & Units 04/24/2021 04/16/2020 04/15/2020  WBC 4.0 - 10.5 K/uL 4.2 6.4 9.5  Hemoglobin 12.0 - 15.0 g/dL 13.5 13.5 13.6  Hematocrit 36.0 - 46.0 % 38.9 40.2 39.5  Platelets 150.0 - 400.0 K/uL 213.0 182 188    Lab Results  Component Value Date/Time   VD25OH 18.41 (L) 08/30/2021 09:34 AM   VD25OH 32 11/10/2013 03:35 PM    Clinical ASCVD: No  The 10-year ASCVD risk score (Arnett DK, et al., 2019) is: 21.9%   Values used to calculate the score:     Age: 48 years     Sex: Female     Is Non-Hispanic African American: No     Diabetic: No     Tobacco smoker: No     Systolic Blood Pressure: 923 mmHg     Is BP treated: Yes     HDL Cholesterol: 59.3 mg/dL     Total Cholesterol: 249 mg/dL    Depression screen [facility] 2/9 09/04/2021 07/15/2020 07/11/2020  Decreased Interest 0 2 1  Down, Depressed, Hopeless 0 2 1  PHQ - 2 Score 0 4 2  Altered sleeping 0 0 -  Tired, decreased energy 0 0 -  Change in appetite 0 0 -  Feeling bad or failure about yourself  0 0 -  Trouble concentrating 0 0 -  Moving slowly or fidgety/restless 0 0 -  Suicidal thoughts 0 0 -  PHQ-9 Score 0 4 -  Difficult doing work/chores Not difficult at all Not  difficult at all -  Some recent data might be hidden     Social History   Tobacco Use  Smoking Status Never  Smokeless Tobacco Never   BP Readings from Last 3 Encounters:  09/04/21 120/80  07/03/21 (!) 147/92  04/24/21 124/84   Pulse Readings from Last 3 Encounters:  09/04/21 89  04/24/21 80  02/24/21 86   Wt Readings from Last 3 Encounters:  09/04/21 136 lb (61.7 kg)  07/03/21 135 lb (61.2 kg)  04/24/21 135 lb (61.2 kg)   BMI Readings from Last 3 Encounters:  09/04/21 23.34 kg/m  07/03/21 23.17 kg/m  04/24/21 23.17 kg/m    Assessment/Interventions: Review of patient past medical history, allergies, medications, health status, including review of consultants reports, laboratory and other test data, was performed as part of comprehensive evaluation and provision of chronic care management services.   SDOH:  (Social Determinants of Health) assessments and interventions performed: Yes SDOH Interventions    Flowsheet Row  Most Recent Value  SDOH Interventions   Financial Strain Interventions Intervention Not Indicated      SDOH Screenings   Alcohol Screen: Not on file  Depression (PHQ2-9): Low Risk    PHQ-2 Score: 0  Financial Resource Strain: Low Risk    Difficulty of Paying Living Expenses: Not hard at all  Food Insecurity: Not on file  Housing: Not on file  Physical Activity: Not on file  Social Connections: Not on file  Stress: Not on file  Tobacco Use: Low Risk    Smoking Tobacco Use: Never   Smokeless Tobacco Use: Never   Passive Exposure: Not on file  Transportation Needs: Not on file    [place]  Allergies  Allergen Reactions   Hydrocodone     Increased HR   Antihistamines, Loratadine-Type Other (See Comments)    Heart races/jittery   Bisoprolol-Hydrochlorothiazide Other (See Comments)    Couldn't tolerate UNKNOWN REACTION   Cephalexin Nausea And Vomiting   Codeine Other (See Comments)    Increased heart rate, anxiety   Demerol  [Meperidine] Other (See Comments)    Increased heart rate, anxiety   Epinephrine Other (See Comments)    Increased heart rate   Erythromycin Nausea And Vomiting   Hyoscyamine Sulfate Other (See Comments)    Pt could not tolerate this med UNKNOWN reaction   Inderal [Propranolol] Other (See Comments)    Extreme tiredness   Lidocaine Other (See Comments)    Family hx of malignant hyperthermia   Lipitor [Atorvastatin] Other (See Comments)    Muscle aches   Losartan Potassium Other (See Comments)    Pt could not tolerate this med UNKNOWN REACTION   Metoprolol Swelling   Morphine Other (See Comments)    Nightmares, increased BP   Nebivolol Other (See Comments)    angioedema   Paxil [Paroxetine Hcl] Other (See Comments)    "drove her up the wall"   Penicillins Nausea And Vomiting   Prednisone Other (See Comments)    Tolerates low doses if absolutely necessary - makes her nervous, jittery   Propranolol Hcl Other (See Comments)    Pt could not tolerate this med UNKNOWN REACTION   Sertraline Hcl Other (See Comments)    Pt could not tolerate this med UNKNOWN REACTION   Tetracycline Nausea And Vomiting   Toprol Xl [Metoprolol Succinate] Other (See Comments)    Extreme tiredness   Verapamil Other (See Comments)    Pt could not tolerate this med UNKNOWN REACTION   Hydralazine     headaches   Oxycodone-Acetaminophen Palpitations    Causes accelerated heart rate    Medications Reviewed Today     Reviewed by Charlton Haws, [facility] (Pharmacist) on 11/07/21 at 1032  Med List Status: <None>   Medication Order Taking? Sig Documenting Provider Last Dose Status Informant  acetaminophen (TYLENOL) 500 MG tablet [id number] Yes Take 1,000 mg by mouth every 6 (six) hours as needed (pain). [provider] Taking Active Multiple Informants  Carboxymethylcell-Hypromellose 0.25-0.3 % GEL [id number] Yes Place 1 application into both eyes at bedtime.  [provider] Taking Active  Multiple Informants  CARTIA XT 120 MG 24 hr capsule [id number] Yes Take 1 capsule by mouth once daily Burnell Blanks, MD Taking Active   Cholecalciferol (VITAMIN D3) 25 MCG (1000 UT) CAPS [id number] Yes Take 1 capsule (1,000 Units total) by mouth daily. Tonia Ghent, MD Taking Active   famotidine (PEPCID) 20 MG tablet [id number] Yes Take 1 tablet (20 mg  total) by mouth 2 (two) times daily. Tonia Ghent, MD Taking Active   LORazepam (ATIVAN) 1 MG tablet [id number] Yes TAKE 1 TABLET BY MOUTH EVERY 6 HOURS AS NEEDED Tonia Ghent, MD Taking Active   Polyethyl Glycol-Propyl Glycol [facility] OP) [id number] Yes Place 1 drop into both eyes daily as needed (dry eyes).  [provider] Taking Active Multiple Informants  terconazole (TERAZOL 7) 0.4 % vaginal cream [id number]  Place 1 applicator vaginally at bedtime. For 7 nights  Patient taking differently: Place 1 applicator vaginally at bedtime. For 7 nights. As needed   Fontaine, Belinda Block, MD  Active Multiple Informants            Patient Active Problem List   Diagnosis Date Noted   Vitamin D deficiency 09/10/2021   HTN (hypertension) 06/11/2018   Subdural hematoma 09/26/2017   Health care maintenance 05/10/2016   History of thyroid disease 05/10/2016   Osteoarthritis of neck 05/10/2016   S/P total knee arthroplasty 12/20/2014   Medicare annual wellness visit, subsequent 10/12/2014   Advance care planning 10/12/2014   Palpitations 09/23/2013   Fatigue 08/07/2013   Alopecia 05/11/2013   Benign hypertensive heart disease without heart failure 01/07/2013   Pacemaker-Medtronic 10/08/2012   Hiatal hernia 05/28/2012   Esophageal stricture 05/28/2012   Dysuria 04/03/2012   Osteoporosis screening 01/16/2012   Anxiety 01/16/2012   Sick sinus syndrome ([place])    Osteoarthritis of knee 04/19/2011   Internal hemorrhoids 02/28/2011   Constipation 02/28/2011   GERD 06/28/2009   Hypercholesterolemia 03/30/2008   Obstructive  sleep apnea 03/30/2008   History of SCC (squamous cell carcinoma) of skin 03/30/2008    Immunization History  Administered Date(s) Administered   Fluad Quad(high Dose 65+) 09/03/2019, 09/01/2020   Influenza Split 08/21/2012   Influenza,inj,Quad PF,6+ Mos 09/11/2013, 08/25/2014, 09/05/2015, 09/03/2016, 09/03/2017, 08/28/2018   PFIZER(Purple Top)SARS-COV-2 Vaccination 01/01/2020, 01/23/2020, 09/26/2020   Pfizer Covid-19 Vaccine Bivalent Booster 59yrs & up 10/09/2021   Pneumococcal Conjugate-13 09/05/2015   Pneumococcal Polysaccharide-23 10/11/2014   Tdap 07/04/2007, 07/31/2018   Zoster, Live 06/04/2012    Conditions to be addressed/monitored:  Hypertension, Hyperlipidemia, GERD, and Anxiety, Vitamin D Deficiency  Care Plan : [facility]  Updates made by Charlton Haws, [place] since 11/08/2021 12:00 AM     Problem: Hypertension, Hyperlipidemia, GERD, and Anxiety, Vitamin D Deficiency   Priority: High     Long-Range Goal: Disease mgmt   Start Date: 11/08/2021  Expected End Date: 11/08/2022  This Visit's Progress: On track  Priority: High  Note:   Current Barriers:  Unable to independently monitor therapeutic efficacy  Pharmacist Clinical Goal(s):  Patient will achieve adherence to monitoring guidelines and medication adherence to achieve therapeutic efficacy through collaboration with PharmD and provider.   Interventions: 1:1 collaboration with Tonia Ghent, MD regarding development and update of comprehensive plan of care as evidenced by provider attestation and co-signature Inter-disciplinary care team collaboration (see longitudinal plan of care) Comprehensive medication review performed; medication list updated in electronic medical record  Hypertension (BP goal <130/80) -Not ideally controlled - BP 144/47 at Neurosurgery appt 11/30; pt does not check BP regularly at home -Hx sick sinus syndrome s/p PPM; hx OSA on CPAP -Current treatment: Diltiazem ER  120 mg daily -Medications previously tried: bisoprolol, HCTZ, losartan, metoprolol, nebivolol (?angioedema), hydralazine, verapamil, propranolol, lisinopril -Current dietary habits: 50% take out -Current exercise habits: none currently - she reports she used to be more active, walked 2-3 miles a few times a  week, but now struggles to make it to end of her street -Denies hypotensive/hypertensive symptoms -Educated on BP goals and benefits of medications for prevention of heart attack, stroke and kidney damage; Daily salt intake goal < 2300 mg; Exercise goal of 150 minutes per week; Importance of home blood pressure monitoring; Proper BP monitoring technique; -Counseled to monitor BP at home daily, document, and provide log at future appointments -Recommended to continue current medication  Hyperlipidemia: (LDL goal 30% reduction - 114) -Not ideally controlled - LDL has consistently increased since 2017 (123 > 163); pt reports she exercises much less than she used to, her diet is "not great"; ASCVD risk is high (22%) and she would benefit from statin therapy, however she has many intolerances to medications and does not want to start a statin -Current treatment: None -Medications previously tried: atorvastatin  -Educated on Cholesterol goals; Importance of limiting foods high in cholesterol; -Advised to gradually increase exercise up to 150 min/week  Anxiety (Goal: manage symptoms) -Controlled - pt reports long history of anxiety "I've always been an anxious person"; she is weary of trying new medications due to history of intolerance to several meds; she reports lorazepam works well for her and she is careful not to overuse it -Current treatment: Lorazepam 1 mg q6h PRN (#120 per month) - usually 2-3 per day -Medications previously tried/failed: paroxetine, sertraline -PHQ9: 0 -GAD7: not on file -Connected with PCP for mental health support -Recommended to continue current medication  GERD  (Goal: manage symptoms) -Controlled - pt reports famotidine works fairly well -Current treatment  Famotidine 20 mg BID [place] PRN -Recommended to continue current medication  Osteoporosis screening (Goal prevent fractures) -Not ideally controlled - pt is overdue for repeat DEXA scan; she also has not started Vitamin D supplement due to fear of side effects -Last DEXA Scan: 12/2012   T-Score femoral neck: -0.2  T-Score total hip: -0.5  T-Score lumbar spine: -0.2  10-year probability of major osteoporotic fracture: 8.5%  10-year probability of hip fracture: 1.1% -Patient is not a candidate for pharmacologic treatment -Current treatment  Vitamin D 1000 IU daily -Counseled on importance of Vitamin D not only for bone health but immune support, energy and mood; advised she start Vitamin D supplement -Recommend repeat DEXA scan once Vitamin D level normalizes  Osteoarthritis (Goal: manage pain) -Not ideally controlled - pt reports she is following with Neurosurgery/Spine for help with her neck; they are planning injections within the next couple weeks -Neck and knees effected; s/p TKA; injection upcoming later this month -Current treatment  Tylenol 500 mg - 2 tab PRN -Medications previously tried: ibuprofen (hx SDH)  -Counseled on max daily dose of Tylenol 3000 mg; advised she can use Tylenol more often if needed  Health Maintenance -Vaccine gaps: Flu, Shingrix -Pt reports she and her husband had flu shots at [employer] in the fall, cannot recall dates. Will contact pharmacy for dates. -Current therapy:  Systane eye drops -Patient is satisfied with current therapy and denies issues -Recommended to continue current medication  Patient Goals/Self-Care Activities Patient will:  - take medications as prescribed as evidenced by patient report and record review focus on medication adherence by routine check blood pressure daily, document, and provide at future appointments target a  minimum of 150 minutes of moderate intensity exercise weekly -Start taking Vitamin D 1000 IU daily      Medication Assistance: None required.  Patient affirms current coverage meets needs.  Compliance/Adherence/Medication fill history: Care Gaps: None  Star-Rating Drugs: None  Patient's preferred pharmacy is:  [place], [place] - [address] [address] Phone: [phone] Fax: [phone]  [id number] - [place], [address] [place] [address] Phone: [phone] Fax: [phone]  Uses pill box? Yes Pt endorses 100% compliance  We discussed: Current pharmacy is preferred with insurance plan and patient is satisfied with pharmacy services Patient decided to: Continue current medication management strategy  Care Plan and Follow Up Patient Decision:  Patient agrees to Care Plan and Follow-up.  Plan: Telephone follow up appointment with care management team member scheduled for:  3 months  Charlene Brooke, PharmD, BCACP Clinical Pharmacist [facility] [phone]

## 2021-11-08 NOTE — Patient Instructions (Signed)
Visit Information  Phone number for Pharmacist: [phone]  Thank you for meeting with me to discuss your medications! I look forward to working with you to achieve your health care goals. Below is a summary of what we talked about during the visit:   Goals Addressed             This Visit's Progress    Track and Manage My Blood Pressure-Hypertension       Timeframe:  Long-Range Goal Priority:  High Start Date:       11/08/21                      Expected End Date:   11/08/22                    Follow Up Date March 2023   - check blood pressure 3 times per week - choose a place to take my blood pressure (home, clinic or office, retail store) - write blood pressure results in a log or diary    Why is this important?   You won't feel high blood pressure, but it can still hurt your blood vessels.  High blood pressure can cause heart or kidney problems. It can also cause a stroke.  Making lifestyle changes like losing a little weight or eating less salt will help.  Checking your blood pressure at home and at different times of the day can help to control blood pressure.  If the doctor prescribes medicine remember to take it the way the doctor ordered.  Call the office if you cannot afford the medicine or if there are questions about it.     Notes:         Care Plan : CCM Pharmacy Care Plan  Updates made by Charlton Haws, RPH since 11/08/2021 12:00 AM     Problem: Hypertension, Hyperlipidemia, GERD, and Anxiety, Vitamin D Deficiency   Priority: High     Long-Range Goal: Disease mgmt   Start Date: 11/08/2021  Expected End Date: 11/08/2022  This Visit's Progress: On track  Priority: High  Note:   Current Barriers:  Unable to independently monitor therapeutic efficacy  Pharmacist Clinical Goal(s):  Patient will achieve adherence to monitoring guidelines and medication adherence to achieve therapeutic efficacy through collaboration with PharmD and provider.    Interventions: 1:1 collaboration with Tonia Ghent, MD regarding development and update of comprehensive plan of care as evidenced by provider attestation and co-signature Inter-disciplinary care team collaboration (see longitudinal plan of care) Comprehensive medication review performed; medication list updated in electronic medical record  Hypertension (BP goal <130/80) -Not ideally controlled - BP 144/47 at Neurosurgery appt 11/30; pt does not check BP regularly at home -Hx sick sinus syndrome s/p PPM; hx OSA on CPAP -Current treatment: Diltiazem ER 120 mg daily -Medications previously tried: bisoprolol, HCTZ, losartan, metoprolol, nebivolol (?angioedema), hydralazine, verapamil, propranolol, lisinopril -Current dietary habits: 50% take out -Current exercise habits: none currently - she reports she used to be more active, walked 2-3 miles a few times a week, but now struggles to make it to end of her street -Denies hypotensive/hypertensive symptoms -Educated on BP goals and benefits of medications for prevention of heart attack, stroke and kidney damage; Daily salt intake goal < 2300 mg; Exercise goal of 150 minutes per week; Importance of home blood pressure monitoring; Proper BP monitoring technique; -Counseled to monitor BP at home daily, document, and provide log at future appointments -Recommended to continue  current medication  Hyperlipidemia: (LDL goal 30% reduction - 114) -Not ideally controlled - LDL has consistently increased since 2017 (123 > 163); pt reports she exercises much less than she used to, her diet is "not great"; ASCVD risk is high (22%) and she would benefit from statin therapy, however she has many intolerances to medications and does not want to start a statin -Current treatment: None -Medications previously tried: atorvastatin  -Educated on Cholesterol goals; Importance of limiting foods high in cholesterol; -Advised to gradually increase exercise up to  150 min/week  Anxiety (Goal: manage symptoms) -Controlled - pt reports long history of anxiety "I've always been an anxious person"; she is weary of trying new medications due to history of intolerance to several meds; she reports lorazepam works well for her and she is careful not to overuse it -Current treatment: Lorazepam 1 mg q6h PRN (#120 per month) - usually 2-3 per day -Medications previously tried/failed: paroxetine, sertraline -PHQ9: 0 -GAD7: not on file -Connected with PCP for mental health support -Recommended to continue current medication  GERD (Goal: manage symptoms) -Controlled - pt reports famotidine works fairly well -Current treatment  Famotidine 20 mg BID [place] PRN -Recommended to continue current medication  Osteoporosis screening (Goal prevent fractures) -Not ideally controlled - pt is overdue for repeat DEXA scan; she also has not started Vitamin D supplement due to fear of side effects -Last DEXA Scan: 12/2012   T-Score femoral neck: -0.2  T-Score total hip: -0.5  T-Score lumbar spine: -0.2  10-year probability of major osteoporotic fracture: 8.5%  10-year probability of hip fracture: 1.1% -Patient is not a candidate for pharmacologic treatment -Current treatment  Vitamin D 1000 IU daily -Counseled on importance of Vitamin D not only for bone health but immune support, energy and mood; advised she start Vitamin D supplement -Recommend repeat DEXA scan once Vitamin D level normalizes  Osteoarthritis (Goal: manage pain) -Not ideally controlled - pt reports she is following with Neurosurgery/Spine for help with her neck; they are planning injections within the next couple weeks -Neck and knees effected; s/p TKA; injection upcoming later this month -Current treatment  Tylenol 500 mg - 2 tab PRN -Medications previously tried: ibuprofen (hx SDH)  -Counseled on max daily dose of Tylenol 3000 mg; advised she can use Tylenol more often if needed  Health  Maintenance -Vaccine gaps: Flu, Shingrix -Pt reports she and her husband had flu shots at [facility] in the fall, cannot recall dates. Will contact pharmacy for dates. -Current therapy:  Systane eye drops -Patient is satisfied with current therapy and denies issues -Recommended to continue current medication  Patient Goals/Self-Care Activities Patient will:  - take medications as prescribed as evidenced by patient report and record review focus on medication adherence by routine check blood pressure daily, document, and provide at future appointments target a minimum of 150 minutes of moderate intensity exercise weekly -Start taking Vitamin D 1000 IU daily      Ms. Vasbinder was given information about Chronic Care Management services today including:  CCM service includes personalized support from designated clinical staff supervised by her physician, including individualized plan of care and coordination with other care providers 24/7 contact phone numbers for assistance for urgent and routine care needs. Standard insurance, coinsurance, copays and deductibles apply for chronic care management only during months in which we provide at least 20 minutes of these services. Most insurances cover these services at 100%, however patients may be responsible for any copay, coinsurance and/or deductible  if applicable. This service may help you avoid the need for more expensive face-to-face services. Only one practitioner may furnish and bill the service in a calendar month. The patient may stop CCM services at any time (effective at the end of the month) by phone call to the office staff.  Patient agreed to services and verbal consent obtained.   Patient verbalizes understanding of instructions provided today and agrees to view in [place].  Telephone follow up appointment with pharmacy team member scheduled for: 3 months  Charlene Brooke, PharmD, [facility] Clinical Pharmacist [facility]  at [facility] [phone]

## 2021-11-10 NOTE — Progress Notes (Signed)
Remote pacemaker transmission.   

## 2021-11-17 ENCOUNTER — Other Ambulatory Visit: Payer: Self-pay | Admitting: Cardiovascular Disease

## 2021-11-17 DIAGNOSIS — I119 Hypertensive heart disease without heart failure: Secondary | ICD-10-CM

## 2021-11-22 DIAGNOSIS — H34811 Central retinal vein occlusion, right eye, with macular edema: Secondary | ICD-10-CM | POA: Diagnosis not present

## 2021-11-28 ENCOUNTER — Telehealth: Payer: Self-pay | Admitting: *Deleted

## 2021-11-28 ENCOUNTER — Telehealth: Payer: Medicare Other | Admitting: Nurse Practitioner

## 2021-11-28 DIAGNOSIS — B370 Candidal stomatitis: Secondary | ICD-10-CM | POA: Diagnosis not present

## 2021-11-28 MED ORDER — NYSTATIN 100000 UNIT/ML MT SUSP
5.0000 mL | Freq: Four times a day (QID) | OROMUCOSAL | 0 refills | Status: DC
Start: 2021-11-28 — End: 2021-12-06

## 2021-11-28 NOTE — Telephone Encounter (Signed)
PLEASE NOTE: All timestamps contained within this report are represented as [place] Standard Time. CONFIDENTIALTY NOTICE: This fax transmission is intended only for the addressee. It contains information that is legally privileged, confidential or otherwise protected from use or disclosure. If you are not the intended recipient, you are strictly prohibited from reviewing, disclosing, copying using or disseminating any of this information or taking any action in reliance on or regarding this information. If you have received this fax in error, please notify us immediately by telephone so that we can arrange for its return to [place]. Phone: [phone], Toll-Free: [phone], Fax: [phone] Page: 1 of 2 Call Id: [id number] [facility] - Client TELEPHONE ADVICE RECORD AccessNurse Patient Name: MONTANNA MCBAIN NE Gender: Female DOB: Mar 18, 1946 Age: 52 Y 5 D Return Phone Number: [phone] (Primary), [phone] (Secondary) Address: [place]/ State/ Zip: [place] [address] Client [facility] - Client Client Site [place] - Day Provider Renford Dills - MD Contact Type Call Who Is Calling Patient / Member / Family / Caregiver Call Type Triage / Clinical Relationship To Patient Self Return Phone Number [phone] (Primary) Chief Complaint Mouth Symptoms Reason for Call Symptomatic / Request for [place] states she has a coating on her tongue that is irritated. Translation No Nurse Assessment Nurse: Nicki Reaper, RN, Malachy Mood Date/Time (Eastern Time): 11/28/2021 9:38:27 AM Confirm and document reason for call. If symptomatic, describe symptoms. ---Caller states she has a white coding on her tongue that started a few days ago her tongue looked irritated and this morning throat felt irritated and denies throat pain, rates pain as irritating, has some  ulcers on the underside of her tongue, leaving this morning for [place], denies fever and other symptoms, Does the patient have any new or worsening symptoms? ---Yes Will a triage be completed? ---Yes Related visit to physician within the last 2 weeks? ---No Does the PT have any chronic conditions? (i.e. diabetes, asthma, this includes High risk factors for pregnancy, etc.) ---Yes List chronic conditions. ---HTN Is this a behavioral health or substance abuse call? ---No Guidelines Guideline Title Affirmed Question Affirmed Notes Nurse Date/Time (Eastern Time) Mouth Symptoms [1] Dry mouth AND [2] new-onset AND [3] unexplained (Exceptions: chronic symptom or dry mouth from mild dehydration) Nicki Reaper, RN, Malachy Mood 11/28/2021 9:42:25 AM PLEASE NOTE: All timestamps contained within this report are represented as [place] Standard Time. CONFIDENTIALTY NOTICE: This fax transmission is intended only for the addressee. It contains information that is legally privileged, confidential or otherwise protected from use or disclosure. If you are not the intended recipient, you are strictly prohibited from reviewing, disclosing, copying using or disseminating any of this information or taking any action in reliance on or regarding this information. If you have received this fax in error, please notify us immediately by telephone so that we can arrange for its return to [place]. Phone: [phone], Toll-Free: [phone], Fax: [phone] Page: 2 of 2 Call Id: [id number] [place]. Time Eilene Ghazi Time) Disposition Final User 11/28/2021 9:48:05 AM See PCP within 24 Hours Yes Nicki Reaper, RN, Malachy Mood Disposition Overriden: SEE PCP WITHIN 3 DAYS Override Reason: Patients symptoms need a higher level of care Caller Disagree/Comply Comply Caller Understands Yes PreDisposition Call Doctor Care Advice Given Per Guideline SEE PCP WITHIN 24 HOURS: * IF OFFICE WILL BE OPEN: You need to be examined within the next 24 hours. Call  your doctor (or NP/PA) when the office opens and make  an appointment. CALL BACK IF: * You become worse Comments User: Burna Sis, RN Date/Time Eilene Ghazi Time): 11/28/2021 9:49:45 AM Warm transferred to appt desk due to long hold times, #18 in the call cue for appt, instructed caller to let them know she has spoken with triage nurse and will need to be seen in next 24 hours and that she is going out of town today Referrals REFERRED TO PCP OFFICE

## 2021-11-28 NOTE — Telephone Encounter (Signed)
Noted. Thanks.

## 2021-11-28 NOTE — Telephone Encounter (Signed)
Patient scheduled for an appointment tomorrow 11/29/21 at 4:00 with Dr. Glori Bickers. Appointment is to be changed to a virtual per Amy front office supervisor. It appears that patient is scheduled for an e-visit today.

## 2021-11-28 NOTE — Progress Notes (Signed)
E-Visit for Mouth Ulcers  We are sorry that you are not feeling well.  Here is how we plan to help!  Based on what you have shared with me, it appears that you do have oral thrush      The following medications should decrease the discomfort and help with healing. Nystatin Oral rinse (swish and spit) this can be done 4 times daily until symptoms resolve. If symptoms are not improving within the week, or with any worsening symptoms please follow up with your primary care doctor.   Meds ordered this encounter  Medications   nystatin (MYCOSTATIN) 100000 UNIT/ML suspension    Sig: Take 5 mLs (500,000 Units total) by mouth 4 (four) times daily.    Dispense:  150 mL    Refill:  0     Mouth ulcers are painful areas in the mouth and gums. They can occur anywhere inside the mouth. While mostly harmless, mouth ulcers can be extremely uncomfortable and may make it difficult to eat, drink, and brush your teeth.     While the exact causes are unknown, some common causes and factors that may aggravate mouth ulcers include: Genetics - Sometimes mouth ulcers run in families High alcohol intake Acidic foods such as citrus fruits like pineapple, grapefruit, orange fruits/juices, may aggravate mouth ulcers Other foods high in acidity or spice such as coffee, chocolate, chips, pretzels, eggs, nuts, cheese Quitting smoking Injury caused by biting the tongue or inside of the cheek Diet lacking in B-15, zinc, folic acid or iron Female hormone shifts with menstruation Excessive fatigue, emotional stress or anxiety Prevention: Talk to your doctor if you are taking meds that are known to cause mouth ulcers such as:   Anti-inflammatory drugs (for example Ibuprofen, Naproxen sodium), pain killers, Beta blockers, Oral nicotine replacement drugs, Some street drugs (heroin).   Avoid allowing any tablets to dissolve in your mouth that are meant to swallowed whole Avoid foods/drinks that trigger or worsen  symptoms Keep your mouth clean with daily brushing and flossing  Home Care: The goal with treatment is to ease the pain where ulcers occur and help them heal as quickly as possible.  There is no medical treatment to prevent mouth ulcers from coming back or recurring.  Avoid spicy and acidic foods Eat soft foods and avoid rough, crunchy foods Avoid chewing gum Do not use toothpaste that contains sodium lauryl sulphite Use a straw to drink which helps avoid liquids toughing the ulcers near the front of your mouth Use a very soft toothbrush If you have dentures or dental hardware that you feel is not fitting well or contributing to his, please see your dentist. Use saltwater mouthwash which helps healing. Dissolve a  teaspoon of salt in a glass of warm water. Swish around your mouth and spit it out. This can be used as needed if it is soothing.   GET HELP RIGHT AWAY IF: Persistent ulcers require checking IN PERSON (face to face). Any mouth lesion lasting longer than a month should be seen by your DENTIST as soon as possible for evaluation for possible oral cancer. If you have a non-painful ulcer in 1 or more areas of your mouth Ulcers that are spreading, are very large or particularly painful Ulcers last longer than one week without improving on treatment If you develop a fever, swollen glands and begin to feel unwell Ulcers that developed after starting a new medication MAKE SURE YOU: Understand these instructions. Will watch your condition. Will get help  right away if you are not doing well or get worse.  Thank you for choosing an e-visit.  Your e-visit answers were reviewed by a board certified advanced clinical practitioner to complete your personal care plan. Depending upon the condition, your plan could have included both over the counter or prescription medications.  Please review your pharmacy choice. Make sure the pharmacy is open so you can pick up prescription now. If there is  a problem, you may contact your provider through [employer] and have the prescription routed to another pharmacy.  Your safety is important to [place]. If you have drug allergies check your prescription carefully.   For the next 24 hours you can use MyChart to ask questions about today's visit, request a non-urgent call back, or ask for a work or school excuse. You will get an email in the next two days asking about your experience. I hope that your e-visit has been valuable and will speed your recovery.   I spent approximately 7 minutes reviewing the patient's history, current symptoms and coordinating their plan of care today.

## 2021-11-29 ENCOUNTER — Telehealth: Payer: Medicare Other | Admitting: Family Medicine

## 2021-12-01 ENCOUNTER — Ambulatory Visit: Payer: Medicare Other | Admitting: Internal Medicine

## 2021-12-01 ENCOUNTER — Other Ambulatory Visit: Payer: Self-pay

## 2021-12-01 ENCOUNTER — Encounter: Payer: Self-pay | Admitting: Internal Medicine

## 2021-12-01 VITALS — BP 126/78 | HR 71 | Ht 64.0 in | Wt 133.6 lb

## 2021-12-01 DIAGNOSIS — I495 Sick sinus syndrome: Secondary | ICD-10-CM | POA: Diagnosis not present

## 2021-12-01 DIAGNOSIS — I10 Essential (primary) hypertension: Secondary | ICD-10-CM | POA: Diagnosis not present

## 2021-12-01 DIAGNOSIS — R001 Bradycardia, unspecified: Secondary | ICD-10-CM | POA: Diagnosis not present

## 2021-12-01 DIAGNOSIS — I471 Supraventricular tachycardia: Secondary | ICD-10-CM

## 2021-12-01 NOTE — Progress Notes (Signed)
PCP: Tonia Ghent, MD Primary Cardiologist: Dr Angelena Form Primary EP:  Dr Rayann Heman  [name] is a 75 y.o. female who presents today for routine electrophysiology followup.  Since last being seen in our clinic, the patient reports doing very well.  Today, she denies symptoms of palpitations, chest pain, shortness of breath,  lower extremity edema, dizziness, presyncope, or syncope.  The patient is otherwise without complaint today.   Past Medical History:  Diagnosis Date   Adenomatous colon polyp 12/2006   Allergic rhinitis    Anemia    Anxiety    Appendicitis 04/2020   Arthritis    Bradycardia    s/p MDT PPM   Cataract    bilateral   Depression    Esophageal stricture    Family history of adverse reaction to anesthesia    maternal uncle with MH, nephew had MH during surgery and was OK, pt not been tested   GERD (gastroesophageal reflux disease)    Hemorrhoids    Hyperlipidemia    Hypertension    Inguinal hernia    Osteoporosis    Presence of permanent cardiac pacemaker 2006   SSS   PVC (premature ventricular contraction)    Scleritis of right eye    Skin cancer    Basal and squamous cell   Sleep apnea    a. intolerant of CPAP, does wear a mouthpiece   Subdural hematoma    a. spontanous 09/2017 b. resolved without intervention   Thyroid disease    Vitreous detachment of both eyes    Past Surgical History:  Procedure Laterality Date   ABDOMINAL HYSTERECTOMY     APPENDECTOMY     BREAST BIOPSY Left 01/15/2014   BREAST SURGERY     Biopsy-benign   CESAREAN SECTION     COLONOSCOPY  2018   DILATION AND CURETTAGE OF UTERUS     ESOPHAGOGASTRODUODENOSCOPY     esophageal stretching   HAND SURGERY  1992   HERNIA REPAIR     INGUINAL HERNIA REPAIR  02/12/1994   left inguinal hernia repair   KNEE ARTHROSCOPY  2011   LAPAROSCOPIC APPENDECTOMY N/A 11/29/2020   Procedure: APPENDECTOMY LAPAROSCOPIC;  Surgeon: Jesusita Oka, MD;  Location: [place];  Service: General;  Laterality: N/A;   OOPHORECTOMY  1995   LSO and RSO   PACEMAKER GENERATOR CHANGE N/A 01/21/2015   Procedure: PACEMAKER GENERATOR CHANGE;  Surgeon: [name], MD;  Location: [facility] CATH LAB;  Service: Cardiovascular;  Laterality: N/A;   PACEMAKER INSERTION  2006   Dr. Verlon Setting   POLYPECTOMY     Tear duct surgery  2010   [place]   RIGHT SIDE   TOTAL KNEE ARTHROPLASTY Right 12/20/2014   Procedure: RIGHT TOTAL KNEE ARTHROPLASTY;  Surgeon: Vickey Huger, MD;  Location: [place];  Service: Orthopedics;  Laterality: Right;   UPPER GASTROINTESTINAL ENDOSCOPY     unsure    ROS- all systems are reviewed and negative except as per HPI above  Current Outpatient Medications  Medication Sig Dispense Refill   acetaminophen (TYLENOL) 500 MG tablet Take 1,000 mg by mouth every 6 (six) hours as needed (pain).     Carboxymethylcell-Hypromellose 0.25-0.3 % GEL Place 1 application into both eyes at bedtime.      Cholecalciferol (VITAMIN D3) 25 MCG (1000 UT) CAPS Take 1 capsule (1,000 Units total) by mouth daily.     diltiazem (CARTIA XT) 120 MG 24 hr  capsule Take 1 capsule (120 mg total) by mouth daily. Please make yearly appt with Dr. Angelena Form for March 2023 for future refills. Thank you 1st attempt 90 capsule 0   famotidine (PEPCID) 20 MG tablet Take 1 tablet (20 mg total) by mouth 2 (two) times daily. 180 tablet 3   LORazepam (ATIVAN) 1 MG tablet TAKE 1 TABLET BY MOUTH EVERY 6 HOURS AS NEEDED 120 tablet 2   nystatin (MYCOSTATIN) 100000 UNIT/ML suspension Take 5 mLs (500,000 Units total) by mouth 4 (four) times daily. 150 mL 0   Polyethyl Glycol-Propyl Glycol (SYSTANE OP) Place 1 drop into both eyes daily as needed (dry eyes).      terconazole (TERAZOL 7) 0.4 % vaginal cream Place 1 applicator vaginally at bedtime. For 7 nights (Patient taking differently: Place 1 applicator vaginally at bedtime. For 7 nights. As needed) 45 g 4   No current  facility-administered medications for this visit.    Physical Exam: Vitals:   12/01/21 1504  BP: 126/78  Pulse: 71  SpO2: 98%  Weight: 133 lb 9.6 oz (60.6 kg)  Height: 5\' 4"  (1.626 m)    GEN- The patient is well appearing, alert and oriented x 3 today.   Head- normocephalic, atraumatic Eyes-  Sclera clear, conjunctiva pink Ears- hearing intact Oropharynx- clear Lungs- Clear to ausculation bilaterally, normal work of breathing Chest- pacemaker pocket is well healed Heart- Regular rate and rhythm, no murmurs, rubs or gallops, PMI not laterally displaced GI- soft, NT, ND, + BS Extremities- no clubbing, cyanosis, or edema  Pacemaker interrogation- reviewed in detail today,  See PACEART report  ekg tracing ordered today is personally reviewed and shows atrial paced rhythm 71 bpm  Assessment and Plan:  1. Symptomatic sinus bradycardia  Normal pacemaker function See Pace Art report No changes today she is not device dependant today  2. HTN Stable No change required today  3. Nonsustained atrial tachycardia, NSVT She has short, rare palpitations She has been found to have both nonsustained atach and NSVT as causes No therapy is warranted at this time.  Risks, benefits and potential toxicities for medications prescribed and/or refilled reviewed with patient today.   Return to see EP APP in a year  [name] MD, [facility] 12/01/2021 3:07 PM

## 2021-12-01 NOTE — Patient Instructions (Addendum)
Medication Instructions:  Your physician recommends that you continue on your current medications as directed. Please refer to the Current Medication list given to you today. *If you need a refill on your cardiac medications before your next appointment, please call your pharmacy*  Lab Work: None. If you have labs (blood work) drawn today and your tests are completely normal, you will receive your results only by: [place] (if you have MyChart) OR A paper copy in the mail If you have any lab test that is abnormal or we need to change your treatment, we will call you to review the results.  Testing/Procedures: None.  Follow-Up: At [facility], you and your health needs are our priority.  As part of our continuing mission to provide you with exceptional heart care, we have created designated Provider Care Teams.  These Care Teams include your primary Cardiologist (physician) and Advanced Practice Providers (APPs -  Physician Assistants and Nurse Practitioners) who all work together to provide you with the care you need, when you need it.  Your physician wants you to follow-up in: 12 months with   one of the following Advanced Practice Providers on your designated Care Team:    Tommye Standard, PA-C    You will receive a reminder letter in the mail two months in advance. If you don't receive a letter, please call our office to schedule the follow-up appointment.  Remote monitoring is used to monitor your Pacemaker from home. This monitoring reduces the number of office visits required to check your device to one time per year. It allows [place] to keep an eye on the functioning of your device to ensure it is working properly. You are scheduled for a device check from home on 01/31/22. You may send your transmission at any time that day. If you have a wireless device, the transmission will be sent automatically. After your physician reviews your transmission, you will receive a postcard with your  next transmission date.  We recommend signing up for the patient portal called "MyChart".  Sign up information is provided on this After Visit Summary.  MyChart is used to connect with patients for Virtual Visits (Telemedicine).  Patients are able to view lab/test results, encounter notes, upcoming appointments, etc.  Non-urgent messages can be sent to your provider as well.   To learn more about what you can do with MyChart, go to [email or web address].    Any Other Special Instructions Will Be Listed Below (If Applicable).

## 2021-12-06 ENCOUNTER — Telehealth: Payer: Self-pay | Admitting: Family Medicine

## 2021-12-06 DIAGNOSIS — B370 Candidal stomatitis: Secondary | ICD-10-CM

## 2021-12-06 DIAGNOSIS — I10 Essential (primary) hypertension: Secondary | ICD-10-CM

## 2021-12-06 MED ORDER — NYSTATIN 100000 UNIT/ML MT SUSP: 5.0000 mL | Freq: Four times a day (QID) | OROMUCOSAL | 0 refills | Status: DC

## 2021-12-06 NOTE — Telephone Encounter (Signed)
°**  Wife is almost out of Nystatin, can this be refilled since this was filled by the on call provider? Please advise  She will also need a lab appointment to recheck labs. These messages need to be in her chart so we can make sure we are working on the correct patients. In the future please be sure to message anything about your wife in her chart and the same for your chart. When you guys call about the appointments for labs please have the front staff send [place] a message about her thrush.   Another issue that she needs to know about is the thrush she is experiencing in her mouth.  On 11/28/2021 she was prescribed Nystatin for the thrush, but it does not seem to be improving as of today.  Please advise what she else should do if anything for the thrush. Thank you!

## 2021-12-06 NOTE — Telephone Encounter (Signed)
Pt coming in on 1.5.23 at 1:15 pm for lab appointment

## 2021-12-06 NOTE — Addendum Note (Signed)
Addended by: Tonia Ghent on: 12/06/2021 04:30 PM   Modules accepted: Orders

## 2021-12-06 NOTE — Telephone Encounter (Signed)
I put in the lab orders and resent the nystatin.  I need an update about her symptoms and progress at this point.  Please let me know. Thanks.

## 2021-12-07 ENCOUNTER — Other Ambulatory Visit: Payer: Self-pay

## 2021-12-07 ENCOUNTER — Other Ambulatory Visit (INDEPENDENT_AMBULATORY_CARE_PROVIDER_SITE_OTHER): Payer: Medicare Other

## 2021-12-07 DIAGNOSIS — I10 Essential (primary) hypertension: Secondary | ICD-10-CM

## 2021-12-07 DIAGNOSIS — R7989 Other specified abnormal findings of blood chemistry: Secondary | ICD-10-CM

## 2021-12-07 LAB — BASIC METABOLIC PANEL
BUN: 21 mg/dL (ref 6–23)
CO2: 30 mEq/L (ref 19–32)
Calcium: 9.2 mg/dL (ref 8.4–10.5)
Chloride: 99 mEq/L (ref 96–112)
Creatinine, Ser: 0.91 mg/dL (ref 0.40–1.20)
GFR: 62.35 mL/min (ref >60.00)
Glucose, Bld: 103 mg/dL — ABNORMAL HIGH (ref 70–99)
Potassium: 4.1 mEq/L (ref 3.5–5.1)
Sodium: 135 mEq/L (ref 135–145)

## 2021-12-07 LAB — VITAMIN D 25 HYDROXY (VIT D DEFICIENCY, FRACTURES): VITD: 19.25 ng/mL — ABNORMAL LOW (ref 30.00–100.00)

## 2021-12-07 NOTE — Telephone Encounter (Signed)
Spoke to patient at lab appointment today, thrush is still present has improved very little, she is picking up new bottle of Nystatin today to continue taking QID and will update you on symptoms Monday morning, patient will call if worsens

## 2021-12-08 NOTE — Telephone Encounter (Signed)
Noted thanks °

## 2021-12-11 ENCOUNTER — Encounter: Payer: Self-pay | Admitting: Family Medicine

## 2021-12-11 NOTE — Telephone Encounter (Signed)
Patient also sent mychart message and was advised to call and set up appt. Patient has appt with Dr. Glori Bickers on 12/12/21.

## 2021-12-11 NOTE — Telephone Encounter (Signed)
Needs in person OV if this is not getting totally better.  Please schedule when possible.  Thanks.

## 2021-12-11 NOTE — Telephone Encounter (Signed)
Pt called stating that she still have thrush on her tongue and inside of her mouth. Pt stated that she started taking medication nystatin (MYCOSTATIN) 100000 UNIT/ML suspension on 11/28/21 and Dr Damita Dunnings refilled it on 12/07/21. Pt states that is a little better but she still having thrush mostly on her tongue. Please advise.

## 2021-12-12 ENCOUNTER — Other Ambulatory Visit: Payer: Self-pay

## 2021-12-12 ENCOUNTER — Encounter: Payer: Self-pay | Admitting: Family Medicine

## 2021-12-12 ENCOUNTER — Ambulatory Visit (INDEPENDENT_AMBULATORY_CARE_PROVIDER_SITE_OTHER): Payer: Medicare Other | Admitting: Family Medicine

## 2021-12-12 DIAGNOSIS — B370 Candidal stomatitis: Secondary | ICD-10-CM

## 2021-12-12 MED ORDER — FLUCONAZOLE 100 MG PO TABS: ORAL_TABLET | ORAL | 0 refills | Status: DC

## 2021-12-12 NOTE — Patient Instructions (Signed)
Take the oral diflucan pills (2 today and then one daily for 6 more days)   Let us know in a week if no further improvement  If worse at any point let us know earlier   Take care of yourself

## 2021-12-12 NOTE — Progress Notes (Signed)
Subjective:    Patient ID: [name], female    DOB: [birth year], 76 y.o.   MRN: [id number]  This visit occurred during the SARS-CoV-2 public health emergency.  Safety protocols were in place, including screening questions prior to the visit, additional usage of staff PPE, and extensive cleaning of exam room while observing appropriate contact time as indicated for disinfecting solutions.   HPI 76 yo pt of Dr Damita Dunnings presents with c/o thrush  Wt Readings from Last 3 Encounters:  12/12/21 132 lb 3.2 oz (60 kg)  12/01/21 133 lb 9.6 oz (60.6 kg)  09/04/21 136 lb (61.7 kg)   22.69 kg/m  Was px nystatin for swish and swallow QID Had e visit in late December and has used and one refill   Thought it was starting to clear up a few days ago Still irritated but looked better  Now more thick coating again   Burning sensation  At first, like she burned her tongue  Her taste is affected  Once in a while she feels like it is in her throat (irritation) A few times a little lower down  No dysphagia   Has not been on abx Had a steroid injection on 12/14 for her occipital pain   Lab Results  Component Value Date   CREATININE 0.91 12/07/2021   BUN 21 12/07/2021   NA 135 12/07/2021   K 4.1 12/07/2021   CL 99 12/07/2021   CO2 30 12/07/2021   Lab Results  Component Value Date   ALT 14 08/30/2021   AST 15 08/30/2021   ALKPHOS 51 08/30/2021   BILITOT 0.7 08/30/2021   Patient Active Problem List   Diagnosis Date Noted   Thrush, oral 12/12/2021   Vitamin D deficiency 09/10/2021   HTN (hypertension) 06/11/2018   Subdural hematoma 09/26/2017   Health care maintenance 05/10/2016   History of thyroid disease 05/10/2016   Osteoarthritis of neck 05/10/2016   S/P total knee arthroplasty 12/20/2014   Medicare annual wellness visit, subsequent 10/12/2014   Advance care planning 10/12/2014   Palpitations 09/23/2013   Fatigue 08/07/2013   Alopecia 05/11/2013   Benign hypertensive  heart disease without heart failure 01/07/2013   Pacemaker-[employer] 10/08/2012   Hiatal hernia 05/28/2012   Esophageal stricture 05/28/2012   Dysuria 04/03/2012   Osteoporosis screening 01/16/2012   Anxiety 01/16/2012   Sick sinus syndrome ([place])    Osteoarthritis of knee 04/19/2011   Internal hemorrhoids 02/28/2011   Constipation 02/28/2011   GERD 06/28/2009   Hypercholesterolemia 03/30/2008   Obstructive sleep apnea 03/30/2008   History of SCC (squamous cell carcinoma) of skin 03/30/2008   Past Medical History:  Diagnosis Date   Adenomatous colon polyp 12/2006   Allergic rhinitis    Anemia    Anxiety    Appendicitis 04/2020   Arthritis    Bradycardia    s/p MDT PPM   Cataract    bilateral   Depression    Esophageal stricture    Family history of adverse reaction to anesthesia    maternal uncle with MH, nephew had MH during surgery and was OK, pt not been tested   GERD (gastroesophageal reflux disease)    Hemorrhoids    Hyperlipidemia    Hypertension    Inguinal hernia    Osteoporosis    Presence of permanent cardiac pacemaker 2006   SSS   PVC (premature ventricular contraction)    Scleritis of right eye    Skin cancer    Basal and  squamous cell   Sleep apnea    a. intolerant of CPAP, does wear a mouthpiece   Subdural hematoma    a. spontanous 09/2017 b. resolved without intervention   Thyroid disease    Vitreous detachment of both eyes    Past Surgical History:  Procedure Laterality Date   ABDOMINAL HYSTERECTOMY     APPENDECTOMY     BREAST BIOPSY Left 01/15/2014   BREAST SURGERY     Biopsy-benign   CESAREAN SECTION     COLONOSCOPY  2018   DILATION AND CURETTAGE OF UTERUS     ESOPHAGOGASTRODUODENOSCOPY     esophageal stretching   HAND SURGERY  1992   HERNIA REPAIR     INGUINAL HERNIA REPAIR  02/12/1994   left inguinal hernia repair   KNEE ARTHROSCOPY  2011   LAPAROSCOPIC APPENDECTOMY N/A 11/29/2020   Procedure: APPENDECTOMY LAPAROSCOPIC;  Surgeon:  Jesusita Oka, MD;  Location: [facility];  Service: General;  Laterality: N/A;   OOPHORECTOMY  1995   LSO and RSO   PACEMAKER GENERATOR CHANGE N/A 01/21/2015   Procedure: PACEMAKER GENERATOR CHANGE;  Surgeon: [name], MD;  Location: [facility] CATH LAB;  Service: Cardiovascular;  Laterality: N/A;   PACEMAKER INSERTION  2006   Dr. Verlon Setting   POLYPECTOMY     Tear duct surgery  2010   [facility]   RIGHT SIDE   TOTAL KNEE ARTHROPLASTY Right 12/20/2014   Procedure: RIGHT TOTAL KNEE ARTHROPLASTY;  Surgeon: Vickey Huger, MD;  Location: [facility];  Service: Orthopedics;  Laterality: Right;   UPPER GASTROINTESTINAL ENDOSCOPY     unsure   Social History   Tobacco Use   Smoking status: Never   Smokeless tobacco: Never  Vaping Use   Vaping Use: Never used  Substance Use Topics   Alcohol use: No    Alcohol/week: 0.0 standard drinks   Drug use: No   Family History  Problem Relation Age of Onset   Heart failure Sister    Heart disease Sister    Hypertension Sister    Colon polyps Sister    Dementia Mother    Hypertension Mother    Colon polyps Mother    Hypertension Father    Arthritis Father    Breast cancer Maternal Aunt        Age 23's   Diabetes Maternal Aunt    Diabetes Maternal Uncle    Ovarian cancer Maternal Aunt    Colon cancer Neg Hx    Esophageal cancer Neg Hx    Stomach cancer Neg Hx    Rectal cancer Neg Hx    Allergies  Allergen Reactions   Hydrocodone     Increased HR   Antihistamines, Loratadine-Type Other (See Comments)    Heart races/jittery   Bisoprolol-Hydrochlorothiazide Other (See Comments)    Couldn't tolerate UNKNOWN REACTION   Cephalexin Nausea And Vomiting   Codeine Other (See Comments)    Increased heart rate, anxiety   Demerol [Meperidine] Other (See Comments)    Increased heart rate, anxiety   Epinephrine Other (See Comments)    Increased heart rate   Erythromycin Nausea And Vomiting   Hyoscyamine  Sulfate Other (See Comments)    Pt could not tolerate this med UNKNOWN reaction   Inderal [Propranolol] Other (See Comments)    Extreme tiredness   Lidocaine Other (See Comments)    Family hx of malignant hyperthermia   Lipitor [Atorvastatin] Other (See Comments)    Muscle  aches   Losartan Potassium Other (See Comments)    Pt could not tolerate this med UNKNOWN REACTION   Metoprolol Swelling   Morphine Other (See Comments)    Nightmares, increased BP   Nebivolol Other (See Comments)    angioedema   Paxil [Paroxetine Hcl] Other (See Comments)    "drove her up the wall"   Penicillins Nausea And Vomiting   Prednisone Other (See Comments)    Tolerates low doses if absolutely necessary - makes her nervous, jittery   Propranolol Hcl Other (See Comments)    Pt could not tolerate this med UNKNOWN REACTION   Sertraline Hcl Other (See Comments)    Pt could not tolerate this med UNKNOWN REACTION   Tetracycline Nausea And Vomiting   Toprol Xl [Metoprolol Succinate] Other (See Comments)    Extreme tiredness   Verapamil Other (See Comments)    Pt could not tolerate this med UNKNOWN REACTION   Hydralazine     headaches   Oxycodone-Acetaminophen Palpitations    Causes accelerated heart rate   Current Outpatient Medications on File Prior to Visit  Medication Sig Dispense Refill   acetaminophen (TYLENOL) 500 MG tablet Take 1,000 mg by mouth every 6 (six) hours as needed (pain).     Carboxymethylcell-Hypromellose 0.25-0.3 % GEL Place 1 application into both eyes at bedtime.      Cholecalciferol (VITAMIN D3) 25 MCG (1000 UT) CAPS Take 1 capsule (1,000 Units total) by mouth daily.     diltiazem (CARTIA XT) 120 MG 24 hr capsule Take 1 capsule (120 mg total) by mouth daily. Please make yearly appt with Dr. Angelena Form for March 2023 for future refills. Thank you 1st attempt 90 capsule 0   famotidine (PEPCID) 20 MG tablet Take 1 tablet (20 mg total) by mouth 2 (two) times daily. 180 tablet 3    LORazepam (ATIVAN) 1 MG tablet TAKE 1 TABLET BY MOUTH EVERY 6 HOURS AS NEEDED 120 tablet 2   nystatin (MYCOSTATIN) 100000 UNIT/ML suspension Take 5 mLs (500,000 Units total) by mouth 4 (four) times daily. 150 mL 0   Polyethyl Glycol-Propyl Glycol (SYSTANE OP) Place 1 drop into both eyes daily as needed (dry eyes).      terconazole (TERAZOL 7) 0.4 % vaginal cream Place 1 applicator vaginally at bedtime. For 7 nights (Patient taking differently: Place 1 applicator vaginally at bedtime. For 7 nights. As needed) 45 g 4   No current facility-administered medications on file prior to visit.     Review of Systems  Constitutional:  Negative for activity change, appetite change, fatigue, fever and unexpected weight change.  HENT:  Negative for congestion, ear pain, mouth sores, rhinorrhea, sinus pressure and sore throat.        Tongue discomfort and coating  Eyes:  Negative for pain, redness and visual disturbance.  Respiratory:  Negative for cough, shortness of breath and wheezing.   Cardiovascular:  Negative for chest pain and palpitations.  Gastrointestinal:  Negative for abdominal pain, blood in stool, constipation and diarrhea.  Endocrine: Negative for polydipsia and polyuria.  Genitourinary:  Negative for dysuria, frequency and urgency.  Musculoskeletal:  Positive for neck pain. Negative for arthralgias, back pain and myalgias.  Skin:  Negative for pallor and rash.  Allergic/Immunologic: Negative for environmental allergies.  Neurological:  Negative for dizziness, syncope and headaches.  Hematological:  Negative for adenopathy. Does not bruise/bleed easily.  Psychiatric/Behavioral:  Negative for decreased concentration and dysphoric mood. The patient is not nervous/anxious.  Objective:   Physical Exam Constitutional:      General: She is not in acute distress.    Appearance: Normal appearance. She is normal weight. She is not ill-appearing.  HENT:     Head: Normocephalic and  atraumatic.     Mouth/Throat:     Comments: Mild pale coating on tongue (not in throat) -unable to scrape with tongue blade No ulcers or lesions   No exudate or erythema  Eyes:     Conjunctiva/sclera: Conjunctivae normal.     Pupils: Pupils are equal, round, and reactive to light.  Cardiovascular:     Rate and Rhythm: Normal rate and regular rhythm.     Heart sounds: Normal heart sounds.  Pulmonary:     Effort: Pulmonary effort is normal. No respiratory distress.     Breath sounds: Normal breath sounds. No wheezing.  Musculoskeletal:     Cervical back: Neck supple.  Lymphadenopathy:     Cervical: No cervical adenopathy.  Neurological:     Mental Status: She is alert.     Cranial Nerves: No cranial nerve deficit.  Psychiatric:        Mood and Affect: Mood normal.          Assessment & Plan:   Problem List Items Addressed This Visit       Digestive   Thrush, oral    This occurred after CS steroid injection last month  Some mild improvement with nystatin oral solution  Now troublesome again  Re assuring exam  Will tx with fluconazole 200 mg now and then 100 mg daily for 6 more days  Encouraged to replace toothbrush and other oral appliances  Update if not starting to improve in a week or if worsening        Relevant Medications   fluconazole (DIFLUCAN) 100 MG tablet

## 2021-12-12 NOTE — Assessment & Plan Note (Signed)
This occurred after CS steroid injection last month  Some mild improvement with nystatin oral solution  Now troublesome again  Re assuring exam  Will tx with fluconazole 200 mg now and then 100 mg daily for 6 more days  Encouraged to replace toothbrush and other oral appliances  Update if not starting to improve in a week or if worsening

## 2021-12-13 ENCOUNTER — Telehealth: Payer: Self-pay

## 2021-12-13 ENCOUNTER — Other Ambulatory Visit: Payer: Self-pay | Admitting: Family Medicine

## 2021-12-13 DIAGNOSIS — E559 Vitamin D deficiency, unspecified: Secondary | ICD-10-CM

## 2021-12-13 NOTE — Progress Notes (Signed)
Chronic Care Management Pharmacy Assistant   Name: [name]  MRN: [id number] DOB: 02-23-1946  Reason for Encounter: CCM (Hypertension Disease State)   Recent office visits:  None since last CCM contact  Recent consult visits:  12/12/2021 - Loura Pardon, MD - Family Medicine - Patient presented for thrush. Start: fluconazole (DIFLUCAN) 100 MG tablet - Take 2 pills by mouth today and then one pill daily for 6 days. 12/01/2021 - [name], MD - Cardiology - Patient presented for bradycardia. Labs: EKG. No medication changes.  11/28/2021 - Apolonio Schneiders, [place] - Patient presented for thrush. Start: nystatin (MYCOSTATIN) 100000 UNIT/ML suspension - Take 5 mLs (500,000 Units total) by mouth 4 (four) times daily. 11/15/2021 - Simeon Craft, [facility] - Patient presented for occipital neuralgia and hypertension. No other information.   Hospital visits:  None in previous 6 months  Medications: Outpatient Encounter Medications as of 12/13/2021  Medication Sig   acetaminophen (TYLENOL) 500 MG tablet Take 1,000 mg by mouth every 6 (six) hours as needed (pain).   Carboxymethylcell-Hypromellose 0.25-0.3 % GEL Place 1 application into both eyes at bedtime.    Cholecalciferol (VITAMIN D3) 25 MCG (1000 UT) CAPS Take 1 capsule (1,000 Units total) by mouth daily.   diltiazem (CARTIA XT) 120 MG 24 hr capsule Take 1 capsule (120 mg total) by mouth daily. Please make yearly appt with Dr. Angelena Form for March 2023 for future refills. Thank you 1st attempt   famotidine (PEPCID) 20 MG tablet Take 1 tablet (20 mg total) by mouth 2 (two) times daily.   fluconazole (DIFLUCAN) 100 MG tablet Take 2 pills by mouth today and then one pill daily for 6 days   LORazepam (ATIVAN) 1 MG tablet TAKE 1 TABLET BY MOUTH EVERY 6 HOURS AS NEEDED   nystatin (MYCOSTATIN) 100000 UNIT/ML suspension Take 5 mLs (500,000 Units total) by mouth 4 (four) times daily.   Polyethyl  Glycol-Propyl Glycol (SYSTANE OP) Place 1 drop into both eyes daily as needed (dry eyes).    terconazole (TERAZOL 7) 0.4 % vaginal cream Place 1 applicator vaginally at bedtime. For 7 nights (Patient taking differently: Place 1 applicator vaginally at bedtime. For 7 nights. As needed)   No facility-administered encounter medications on file as of 12/13/2021.   Recent Office Vitals: BP Readings from Last 3 Encounters:  12/12/21 (!) 144/90  12/01/21 126/78  09/04/21 120/80   Pulse Readings from Last 3 Encounters:  12/12/21 62  12/01/21 71  09/04/21 89    Wt Readings from Last 3 Encounters:  12/12/21 132 lb 3.2 oz (60 kg)  12/01/21 133 lb 9.6 oz (60.6 kg)  09/04/21 136 lb (61.7 kg)    Kidney Function Lab Results  Component Value Date/Time   CREATININE 0.91 12/07/2021 12:58 PM   CREATININE 0.95 08/30/2021 09:34 AM   CREATININE 0.86 12/19/2015 09:20 AM   GFR 62.35 12/07/2021 12:58 PM   GFRNONAA >60 04/15/2020 06:38 PM   GFRAA >60 04/15/2020 06:38 PM   BMP Latest Ref Rng & Units 12/07/2021 08/30/2021 04/24/2021  Glucose 70 - 99 mg/dL 103(H) 96 97  BUN 6 - 23 mg/dL 21 16 22   Creatinine 0.40 - 1.20 mg/dL 0.91 0.95 0.89  Sodium 135 - 145 mEq/L 135 137 137  Potassium 3.5 - 5.1 mEq/L 4.1 4.2 4.6  Chloride 96 - 112 mEq/L 99 99 99  CO2 19 - 32 mEq/L 30 29 28   Calcium 8.4 - 10.5 mg/dL  9.2 9.2 9.2   Contacted patient on 12/14/2021 to discuss hypertension disease state  Current antihypertensive regimen:  Diltiazem ER 120 mg daily  Patient verbally confirms she is taking the above medications as directed. Yes  How often are you checking your Blood Pressure? Patient has not been checking her blood pressure at home. I asked patient to start taking her blood pressure daily and keeping a log. I advised patient I would call her back on 01/20 for her readings. Patient understood and agreed.   Wrist or arm cuff: Arm Caffeine intake: Patient does not drink caffeine.  Salt intake: Patient try to  watch her salt intake.  Over the counter medications including pseudoephedrine or NSAIDs? Tylenol as needed - due to neck pain as needed.   What recent interventions/DTPs have been made by any provider to improve Blood Pressure control since last CPP Visit: Continue current medication; Monitor BP at home daily and provide log at future appointments.   Any recent hospitalizations or ED visits since last visit with CPP? No  What diet changes have been made to improve Blood Pressure Control?  Patient does not have any diet changes.   What exercise is being done to improve your Blood Pressure Control?  Patient has not exercised in a long time.   Adherence Review: Is the patient currently on ACE/ARB medication? No Does the patient have >5 day gap between last estimated fill dates? N/A  Star Rating Drugs:  Medication:  Last Fill: Day Supply No Star Rating Drugs noted  Care Gaps: Annual wellness visit in last year? Yes 09/04/2021 Most Recent BP reading: 144/90 on 12/12/2021  Upcoming appointments: No appointments scheduled within the next 30 days.  Charlene Brooke, CPP notified  Marijean Niemann, [place] Clinical Pharmacy Assistant [phone]  Time Spent:  30 Minutes

## 2021-12-21 ENCOUNTER — Telehealth: Payer: Self-pay | Admitting: Family Medicine

## 2021-12-21 DIAGNOSIS — Q383 Other congenital malformations of tongue: Secondary | ICD-10-CM

## 2021-12-21 NOTE — Telephone Encounter (Signed)
I need your input on this patient.  She was diagnosed with thrush but has not improved with nystatin and Diflucan.  Please talk to me about her situation.  Thanks.

## 2021-12-21 NOTE — Telephone Encounter (Signed)
Spoke with patient and she is fine with referral to ENT; she will await call to schedule.

## 2021-12-21 NOTE — Telephone Encounter (Signed)
Please call pt.  I checked with Dr. Glori Bickers.  We both through that it was reasonable to get her to see ENT.  If this were a typical case of thrush, then it should have clearly gotten better in the meantime. I put in the referral in the meantime and we'll try to get her in as soon as we can.  Thanks.

## 2021-12-22 NOTE — Progress Notes (Signed)
Contacted the patient on 12/22/21 for BP log. The patient reports she sent in BP log( from 12/14/21 thru 12/19/21) to Dr. Damita Dunnings 12/11/2021.  The readings that were not given are          Taken 9:00am before food or medications   12/20/21- 132/88  67-P   12/21/21-  140/92  80-P  Marjo Bicker, CPP notified  Avel Sensor, [place] Assistant [phone]  Total time spent for month CPA: 20 min.

## 2021-12-26 ENCOUNTER — Telehealth: Payer: Self-pay | Admitting: Family Medicine

## 2021-12-26 NOTE — Telephone Encounter (Signed)
Inbound call from pt's spouse, Harrie Jeans, in an attempt to follow up on the referral to ENT, as there has been no contact made to date.  Also, he states that her BP remains elevated (150/89, 151/91, 152/99, etc).  Harrie Jeans is asking that Dr. Damita Dunnings be notified. Per the notes area of the referral, not action has been taken thus far.  Thank you

## 2021-12-27 MED ORDER — HYDROCHLOROTHIAZIDE 12.5 MG PO TABS: 12.5000 mg | ORAL_TABLET | Freq: Every day | ORAL | 3 refills | Status: DC

## 2021-12-27 NOTE — Telephone Encounter (Signed)
Spoke with patient about medication that was sent in and possible side effect from HCTZ. Patient verbalized understanding and will call back if she has any issues on med and/or will call back in 1 week with BP readings. Advised patient that our referral coordinator is off today but will hopefully be back in the office tomorrow to work on her referral and someone will update her.

## 2021-12-27 NOTE — Telephone Encounter (Signed)
Ashtyn- please check on the ENT referral.    Janett Billow- please have her try taking HCTZ 12.5mg  daily and update me about her BP in about 1 week.  She may notice increased urination the first few days when she takes the medication.  I would take it in the morning.  Increased urination should level off and resolve.    She had a history of intolerance to bisoprolol hydrochlorothiazide combination.  That was likely related to the beta-blocker component (bisoprolol)and not hydrochlorothiazide since she does not have a sulfa allergy.  He should be okay to use the medication.  Thanks.

## 2021-12-28 ENCOUNTER — Encounter: Payer: Self-pay | Admitting: Family Medicine

## 2021-12-28 NOTE — Telephone Encounter (Signed)
FYI  Referral has been sent to [facility] - She can give them a call to schedule or await their call.   Referral and OV notes faxed via ROI. Patient aware via Mychart to call and schedule appt.  Faxed to [phone]    [email or web address]  Virl Cagey, CMA on 12/28/2021 1047 - delivered at 12/28/2021 [facility] -  Address: [address] [place], [place] [address] Phone: [phone]

## 2022-01-02 ENCOUNTER — Encounter: Payer: Self-pay | Admitting: Family Medicine

## 2022-01-02 ENCOUNTER — Other Ambulatory Visit: Payer: Self-pay

## 2022-01-02 ENCOUNTER — Ambulatory Visit (INDEPENDENT_AMBULATORY_CARE_PROVIDER_SITE_OTHER): Payer: Medicare Other | Admitting: Family Medicine

## 2022-01-02 DIAGNOSIS — M47812 Spondylosis without myelopathy or radiculopathy, cervical region: Secondary | ICD-10-CM

## 2022-01-02 DIAGNOSIS — B370 Candidal stomatitis: Secondary | ICD-10-CM

## 2022-01-02 DIAGNOSIS — I10 Essential (primary) hypertension: Secondary | ICD-10-CM | POA: Diagnosis not present

## 2022-01-02 DIAGNOSIS — K219 Gastro-esophageal reflux disease without esophagitis: Secondary | ICD-10-CM | POA: Diagnosis not present

## 2022-01-02 NOTE — Patient Instructions (Addendum)
If you can put up with the HCTZ then continue as is.  If not, then try taking it every other day.   I'll await the follow up notes about your neck.  Take care.  Glad to see you. You can try elevating the head of your bed and use mylanta if needed.

## 2022-01-02 NOTE — Progress Notes (Signed)
This visit occurred during the SARS-CoV-2 public health emergency.  Safety protocols were in place, including screening questions prior to the visit, additional usage of staff PPE, and extensive cleaning of exam room while observing appropriate contact time as indicated for disinfecting solutions.  We talked about HCTZ use with diltiazem.  She has tolerable urinary frequency.  Her blood pressure is some better in the meantime.  He has recently improved with medication use.  Hydrochlorothiazide should not cause a problem with her concurrent diltiazem use.  Discussed  She has neck pain and DDD in her neck at baseline.  She had prev injections w/o relief, then had f/u steroid injection w/o relief either.  She has been putting up with pain at baseline.  Neck pain isn't worse on HCTZ but she is more fatigued.  She has some of that prior to med start but noted more on the HCTZ.  She attributed some of the fatigue to neck pain.  She has seen neurosurgery clinic about her neck pain.    She had eval for thrush.  She feels some better but still has some sensation of irritation.  Her throat felt scratchy last night.  Then she has some burning in the neck and upper chest last night.  Resolved overnight and no sx now.  She tried nystatin twice before fluconazole, d/w pt.    Meds, vitals, and allergies reviewed.   ROS: Per HPI unless specifically indicated in ROS section   GEN: nad, alert and oriented HEENT: MMM OP wnl now.   NECK: supple w/o LA CV: rrr.   PULM: ctab, no inc wob ABD: soft, +bs EXT: no edema SKIN: Well-perfused.  40 minutes were devoted to patient care in this encounter (this includes time spent reviewing the patient's file/history, interviewing and examining the patient, counseling/reviewing plan with patient).

## 2022-01-03 NOTE — Assessment & Plan Note (Signed)
Clinically resolved, in terms of inspection, but she still has some sensation of persistent irritation.  This may gradually improve in the near future and I would give this a little bit more time.  Discussed.  She can update me as needed.

## 2022-01-03 NOTE — Assessment & Plan Note (Signed)
Likely that she had GERD symptoms.  Reasonable to elevate the head of the bed and she can try Mylanta if needed.  She can update me as needed.

## 2022-01-03 NOTE — Assessment & Plan Note (Signed)
Discussed following up with the neurosurgery clinic and she can update me as needed.

## 2022-01-03 NOTE — Assessment & Plan Note (Signed)
Reasonable to continue hydrochlorothiazide in addition to diltiazem but if she has to cut back on hydrochlorothiazide due to either urinary frequency or fatigue she could try taking it every other day.

## 2022-01-11 ENCOUNTER — Ambulatory Visit (INDEPENDENT_AMBULATORY_CARE_PROVIDER_SITE_OTHER): Payer: Medicare Other | Admitting: Family Medicine

## 2022-01-11 ENCOUNTER — Encounter: Payer: Self-pay | Admitting: Family Medicine

## 2022-01-11 DIAGNOSIS — B370 Candidal stomatitis: Secondary | ICD-10-CM | POA: Diagnosis not present

## 2022-01-11 MED ORDER — FLUCONAZOLE 100 MG PO TABS: ORAL_TABLET | ORAL | 0 refills | Status: DC

## 2022-01-11 NOTE — Patient Instructions (Signed)
I sent the rx for diflucan.  Don't start it if you keep getting better with salt water gargles.   Let me check on the ENT referral.  Either way, I need to see about making sure that is addressed even if you end up not needing the appointment.   Take care.  Glad to see you.

## 2022-01-11 NOTE — Progress Notes (Signed)
This visit occurred during the SARS-CoV-2 public health emergency.  Safety protocols were in place, including screening questions prior to the visit, additional usage of staff PPE, and extensive cleaning of exam room while observing appropriate contact time as indicated for disinfecting solutions.  She has taking HCTZ every other day for the last few days.  D/w pt about restart daily.  Was 150/94 this AM and restarted daily med use today.    Thrush sx.  Had been improving but then had return of sx.  Started again having white changes on the tongue but tongue isn't covered as heavily as prior.  She noted antecedent sensation of tongue irritation.  Gargling with salt water helped some in the meantime.    She hadn't heard about prev ENT referral.  I told her I would check on that.    Meds, vitals, and allergies reviewed.   ROS: Per HPI unless specifically indicated in ROS section   Nad Ncat OP wnl.  No thrush seen No LA

## 2022-01-12 ENCOUNTER — Ambulatory Visit: Payer: Medicare Other | Admitting: Family Medicine

## 2022-01-14 NOTE — Assessment & Plan Note (Addendum)
Hold Diflucan for now.  She can start that again if she does not continue to improve with salt water gargles. I will check on the status of her previous ENT referral. She can update me as needed.

## 2022-01-15 ENCOUNTER — Encounter: Payer: Self-pay | Admitting: *Deleted

## 2022-01-25 ENCOUNTER — Other Ambulatory Visit: Payer: Self-pay | Admitting: Family Medicine

## 2022-01-25 NOTE — Telephone Encounter (Signed)
Refill request for LORazepam 1 MG Oral Tablet  LOV - 01/11/22 Next OV - not scheduled Last refill - 10/18/21 #120/2

## 2022-01-26 DIAGNOSIS — H25013 Cortical age-related cataract, bilateral: Secondary | ICD-10-CM | POA: Diagnosis not present

## 2022-01-26 DIAGNOSIS — H52203 Unspecified astigmatism, bilateral: Secondary | ICD-10-CM | POA: Diagnosis not present

## 2022-01-31 ENCOUNTER — Ambulatory Visit (INDEPENDENT_AMBULATORY_CARE_PROVIDER_SITE_OTHER): Payer: Medicare Other

## 2022-01-31 DIAGNOSIS — I495 Sick sinus syndrome: Secondary | ICD-10-CM | POA: Diagnosis not present

## 2022-02-01 ENCOUNTER — Telehealth: Payer: Medicare Other

## 2022-02-01 ENCOUNTER — Encounter: Payer: Self-pay | Admitting: Family Medicine

## 2022-02-01 LAB — CUP PACEART REMOTE DEVICE CHECK
Battery Impedance: 467 Ohm
Battery Remaining Longevity: 101 mo
Battery Voltage: 2.78 V
Brady Statistic AP VP Percent: 0 %
Brady Statistic AP VS Percent: 61 %
Brady Statistic AS VP Percent: 0 %
Brady Statistic AS VS Percent: 39 %
Date Time Interrogation Session: 20230301093357
Implantable Lead Implant Date: 20060620
Implantable Lead Implant Date: 20060620
Implantable Lead Location: 753859
Implantable Lead Location: 753860
Implantable Lead Model: 5076
Implantable Lead Model: 5076
Implantable Pulse Generator Implant Date: 20160219
Lead Channel Impedance Value: 741 Ohm
Lead Channel Impedance Value: 890 Ohm
Lead Channel Pacing Threshold Amplitude: 0.5 V
Lead Channel Pacing Threshold Amplitude: 0.75 V
Lead Channel Pacing Threshold Pulse Width: 0.4 ms
Lead Channel Pacing Threshold Pulse Width: 0.4 ms
Lead Channel Setting Pacing Amplitude: 2 V
Lead Channel Setting Pacing Amplitude: 2.5 V
Lead Channel Setting Pacing Pulse Width: 0.4 ms
Lead Channel Setting Sensing Sensitivity: 5.6 mV

## 2022-02-02 DIAGNOSIS — H34811 Central retinal vein occlusion, right eye, with macular edema: Secondary | ICD-10-CM | POA: Diagnosis not present

## 2022-02-05 NOTE — Telephone Encounter (Signed)
See my chart message.  Please give a verbal if they will take it to replace her lorazepam prescription with the previous brand.  If I need to send a new prescription please let me know.  Thanks. ?

## 2022-02-09 NOTE — Progress Notes (Signed)
Remote pacemaker transmission.   

## 2022-02-14 ENCOUNTER — Other Ambulatory Visit: Payer: Self-pay | Admitting: *Deleted

## 2022-02-14 DIAGNOSIS — I119 Hypertensive heart disease without heart failure: Secondary | ICD-10-CM

## 2022-02-14 MED ORDER — DILTIAZEM HCL ER COATED BEADS 120 MG PO CP24: 120.0000 mg | ORAL_CAPSULE | Freq: Every day | ORAL | 0 refills | Status: DC

## 2022-02-22 NOTE — Telephone Encounter (Signed)
Please check with pharmacy about this, so patient can get her routine rx/brand.  Thanks.  ?

## 2022-02-23 DIAGNOSIS — M47812 Spondylosis without myelopathy or radiculopathy, cervical region: Secondary | ICD-10-CM | POA: Diagnosis not present

## 2022-02-23 DIAGNOSIS — I10 Essential (primary) hypertension: Secondary | ICD-10-CM | POA: Diagnosis not present

## 2022-02-26 NOTE — Telephone Encounter (Signed)
D/w staff about this situation and staff had contacted pharmacy prior.  This has been addressed.  ?

## 2022-03-12 ENCOUNTER — Encounter: Payer: Self-pay | Admitting: Family Medicine

## 2022-03-12 ENCOUNTER — Ambulatory Visit (INDEPENDENT_AMBULATORY_CARE_PROVIDER_SITE_OTHER): Payer: Medicare Other | Admitting: Family Medicine

## 2022-03-12 DIAGNOSIS — M47812 Spondylosis without myelopathy or radiculopathy, cervical region: Secondary | ICD-10-CM

## 2022-03-12 MED ORDER — HYDROCHLOROTHIAZIDE 12.5 MG PO TABS: 12.5000 mg | ORAL_TABLET | ORAL | Status: DC

## 2022-03-12 NOTE — Patient Instructions (Addendum)
I think it makes sense to see about the neck injection and see if that helps.   ?Take care.  Glad to see you. ?

## 2022-03-12 NOTE — Progress Notes (Signed)
Fatigue.  D/w pt about sources- likely at least partly related to pain.  The last time she had a good night sleep was "I don't know when".   ? ?Neck pain.  She had prev injections w/o relief.  She is putting up with neck pain.  She had an episode yesterday when she turned to the side and then lost her balance.  No falls.  Posterior midline neck pain.  Pain looking over/down in front of her.  No presyncope.  No syncope.  She has f/u injection pending.  No room spinning.   ? ?She has tinnitus noted.  B ears, L likely greater than R.   ? ?She has preexisting changes in taste of food, before the antibiotic below and prior to thrush changes.  She had covid in 07/2021.  Changes may have started at that point but she may not have noted until a period of time thereafter.  She isn't losing weight.   ? ?She didn't have to take diflucan in the meantime.  Her tongue/mouth feels irrirated currently but that may be due to taking clindamycin recently.  Last dose is today, had been taking '300mg'$  BID.   ? ?Still has been on HCTZ every other day and BP is reasonable today.   ? ?Meds, vitals, and allergies reviewed.  ? ?ROS: Per HPI unless specifically indicated in ROS section  ? ?.GEN: nad, alert and oriented ?HEENT: ncat, TM wnl, OP wnl, no thrush seen.   ?NECK: supple w/o LA ?CV: rrr. ?PULM: ctab, no inc wob ?ABD: soft, +bs ?EXT: no edema ?SKIN: Well-perfused. ?She limits her ROM in the neck with rotation but neck isn't stiff, ie her exam is not consistent with meningitis. ?CN 2-12 wnl B, S/S/DTR wnl B ? ?30 minutes were devoted to patient care in this encounter (this includes time spent reviewing the patient's file/history, interviewing and examining the patient, counseling/reviewing plan with patient).  ? ?

## 2022-03-15 NOTE — Assessment & Plan Note (Signed)
Patient and I both think that her neck pain is the main issue here and that is likely affecting her sleep.  We talked about options.  She is going to follow-up with spine clinic.  Consider gabapentin if no help with injection.   Defer medication changes for now. ? ?I do not see any thrush in her mouth and I think it makes sense to follow clinically.  It could be that her changes in taste are post-COVID and I think it makes sense to observe for now.  She agrees with plan. ? ?It appears that her tinnitus is a separate issue and her ear exam is unremarkable today. ? ?

## 2022-03-21 DIAGNOSIS — M47812 Spondylosis without myelopathy or radiculopathy, cervical region: Secondary | ICD-10-CM | POA: Diagnosis not present

## 2022-04-02 ENCOUNTER — Encounter: Payer: Self-pay | Admitting: Cardiovascular Disease

## 2022-04-02 ENCOUNTER — Ambulatory Visit: Payer: Medicare Other | Admitting: Cardiovascular Disease

## 2022-04-02 VITALS — BP 134/80 | HR 63 | Ht 64.0 in | Wt 133.8 lb

## 2022-04-02 DIAGNOSIS — I10 Essential (primary) hypertension: Secondary | ICD-10-CM | POA: Diagnosis not present

## 2022-04-02 DIAGNOSIS — I491 Atrial premature depolarization: Secondary | ICD-10-CM | POA: Diagnosis not present

## 2022-04-02 DIAGNOSIS — I495 Sick sinus syndrome: Secondary | ICD-10-CM

## 2022-04-02 NOTE — Progress Notes (Signed)
? ?Chief Complaint  ?Patient presents with  ? Follow-up  ?  PVCs  ? ?History of Present Illness: 76 yo female with history of sleep apnea, HLD, HTN, GERD/hiatal hernia/esopagheal stricture, sick sinus syndrome s/p pacemaker, PACs, PVCs who is here today for cardiac follow up. She had been followed by Dr. Mare Ferrari for general cardiology issues. Her pacemaker is followed by Dr. Rayann Heman. The pacemaker was changed in 2016 due to end of battery life. Echo June 2017 with normal LV function and small pericardial effusion with no tamponade. She is known to have PACs and PVCs.  She had a spontaneous subdural hematoma in October 2018 that did not require surgical intervention. Echo May 2021 with LVEF=60-65%. No valve disease.  ? ?She is here today for follow up. The patient denies any chest pain, dyspnea, palpitations, lower extremity edema, orthopnea, PND, dizziness, near syncope or syncope.  ? ?Primary Care Physician: Tonia Ghent, MD ? ?Past Medical History:  ?Diagnosis Date  ? Adenomatous colon polyp 12/2006  ? Allergic rhinitis   ? Anemia   ? Anxiety   ? Appendicitis 04/2020  ? Arthritis   ? Bradycardia   ? s/p MDT PPM  ? Cataract   ? bilateral  ? Depression   ? Esophageal stricture   ? Family history of adverse reaction to anesthesia   ? maternal uncle with MH, nephew had MH during surgery and was OK, pt not been tested  ? GERD (gastroesophageal reflux disease)   ? Hemorrhoids   ? Hyperlipidemia   ? Hypertension   ? Inguinal hernia   ? Osteoporosis   ? Presence of permanent cardiac pacemaker 2006  ? SSS  ? PVC (premature ventricular contraction)   ? Scleritis of right eye   ? Skin cancer   ? Basal and squamous cell  ? Sleep apnea   ? a. intolerant of CPAP, does wear a mouthpiece  ? Subdural hematoma (HCC)   ? a. spontanous 09/2017 b. resolved without intervention  ? Thyroid disease   ? Vitreous detachment of both eyes   ? ? ?Past Surgical History:  ?Procedure Laterality Date  ? ABDOMINAL HYSTERECTOMY    ?  APPENDECTOMY    ? BREAST BIOPSY Left 01/15/2014  ? BREAST SURGERY    ? Biopsy-benign  ? CESAREAN SECTION    ? COLONOSCOPY  2018  ? DILATION AND CURETTAGE OF UTERUS    ? ESOPHAGOGASTRODUODENOSCOPY    ? esophageal stretching  ? HAND SURGERY  1992  ? HERNIA REPAIR    ? INGUINAL HERNIA REPAIR  02/12/1994  ? left inguinal hernia repair  ? KNEE ARTHROSCOPY  2011  ? LAPAROSCOPIC APPENDECTOMY N/A 11/29/2020  ? Procedure: APPENDECTOMY LAPAROSCOPIC;  Surgeon: Jesusita Oka, MD;  Location: [facility];  Service: General;  Laterality: N/A;  ? OOPHORECTOMY  1995  ? LSO and RSO  ? PACEMAKER GENERATOR CHANGE N/A 01/21/2015  ? Procedure: PACEMAKER GENERATOR CHANGE;  Surgeon: [name], MD;  Location: [facility] CATH [facility];  Service: Cardiovascular;  Laterality: N/A;  ? PACEMAKER INSERTION  2006  ? Dr. Verlon Setting  ? POLYPECTOMY    ? Tear duct surgery  2010  ? THUMB SURGERY    ? THYROIDECTOMY  1979  ? RIGHT SIDE  ? TOTAL KNEE ARTHROPLASTY Right 12/20/2014  ? Procedure: RIGHT TOTAL KNEE ARTHROPLASTY;  Surgeon: Vickey Huger, MD;  Location: [facility];  Service: Orthopedics;  Laterality: Right;  ? UPPER GASTROINTESTINAL ENDOSCOPY    ? unsure  ? ? ?Current Outpatient  Medications  ?Medication Sig Dispense Refill  ? acetaminophen (TYLENOL) 500 MG tablet Take 1,000 mg by mouth every 6 (six) hours as needed (pain).    ? Carboxymethylcell-Hypromellose 0.25-0.3 % GEL Place 1 application into both eyes at bedtime.     ? Cholecalciferol (VITAMIN D3) 25 MCG (1000 UT) CAPS Take 1 capsule (1,000 Units total) by mouth daily.    ? diltiazem (CARTIA XT) 120 MG 24 hr capsule Take 1 capsule (120 mg total) by mouth daily. Please make yearly appt with Dr. Angelena Form for March 2023 for future refills. Thank you 1st attempt. Please keep upcoming appointment in May 2023 for future refills. Thank you 90 capsule 0  ? famotidine (PEPCID) 20 MG tablet Take 1 tablet (20 mg total) by mouth 2 (two) times daily. 180 tablet 3  ? hydrochlorothiazide (HYDRODIURIL) 12.5  MG tablet Take 1 tablet (12.5 mg total) by mouth every other day.    ? LORazepam (ATIVAN) 1 MG tablet TAKE 1 TABLET BY MOUTH EVERY 6 HOURS AS NEEDED 120 tablet 2  ? Polyethyl Glycol-Propyl Glycol (SYSTANE OP) Place 1 drop into both eyes daily as needed (dry eyes).     ? ?No current facility-administered medications for this visit.  ? ? ?Allergies  ?Allergen Reactions  ? Hydrocodone   ?  Increased HR  ? Antihistamines, Loratadine-Type Other (See Comments)  ?  Heart races/jittery  ? Bisoprolol-Hydrochlorothiazide Other (See Comments)  ?  Couldn't tolerate ?UNKNOWN REACTION  ? Cephalexin Nausea And Vomiting  ? Codeine Other (See Comments)  ?  Increased heart rate, anxiety  ? Demerol [Meperidine] Other (See Comments)  ?  Increased heart rate, anxiety  ? Epinephrine Other (See Comments)  ?  Increased heart rate  ? Erythromycin Nausea And Vomiting  ? Hyoscyamine Sulfate Other (See Comments)  ?  Pt could not tolerate this med ?UNKNOWN reaction  ? Inderal [Propranolol] Other (See Comments)  ?  Extreme tiredness  ? Lidocaine Other (See Comments)  ?  Family hx of malignant hyperthermia  ? Lipitor [Atorvastatin] Other (See Comments)  ?  Muscle aches  ? Losartan Potassium Other (See Comments)  ?  Pt could not tolerate this med ?UNKNOWN REACTION  ? Morphine Other (See Comments)  ?  Nightmares, increased BP  ? Nebivolol Other (See Comments)  ?  angioedema  ? Paxil [Paroxetine Hcl] Other (See Comments)  ?  "drove her up the wall"  ? Penicillins Nausea And Vomiting  ? Prednisone Other (See Comments)  ?  Tolerates low doses if absolutely necessary - makes her nervous, jittery  ? Propranolol Hcl Other (See Comments)  ?  Pt could not tolerate this med ?UNKNOWN REACTION  ? Sertraline Hcl Other (See Comments)  ?  Pt could not tolerate this med ?UNKNOWN REACTION  ? Tetracycline Nausea And Vomiting  ? Toprol Xl [Metoprolol Succinate] Other (See Comments)  ?  Extreme tiredness  ? Verapamil Other (See Comments)  ?  Pt could not tolerate  this med ?UNKNOWN REACTION  ? Hydralazine   ?  headaches  ? Oxycodone-Acetaminophen Palpitations  ?  Causes accelerated heart rate  ? ? ?Social History  ? ?Socioeconomic History  ? Marital status: Married  ?  Spouse name: Harrie Jeans  ? Number of children: 1  ? Years of education: Not on file  ? Highest education level: Not on file  ?Occupational History  ? Occupation: Retired  ?  Employer: UNEMPLOYED  ?Tobacco Use  ? Smoking status: Never  ? Smokeless tobacco: Never  ?  Vaping Use  ? Vaping Use: Never used  ?Substance and Sexual Activity  ? Alcohol use: No  ?  Alcohol/week: 0.0 standard drinks  ? Drug use: No  ? Sexual activity: Not Currently  ?  Birth control/protection: Surgical, Post-menopausal  ?  Comment: HYST-1st intercourse 76 yo-fewer than 5 partners  ?Other Topics Concern  ? Not on file  ?Social History Narrative  ? Education:  12th grade  ? Married 1971  ? 1 son in [place], 4 grandkids  ? 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 ?Social Determinants of Health  ? ?Financial Resource Strain: Low Risk   ? Difficulty of Paying Living Expenses: Not hard at all  ?Food Insecurity: Not on file  ?Transportation Needs: Not on file  ?Physical Activity: Not on file  ?Stress: Not on file  ?Social Connections: Not on file  ?Intimate Partner Violence: Not on file  ? ? ?Family History  ?Problem Relation Age of Onset  ? Heart failure Sister   ? Heart disease Sister   ? Hypertension Sister   ? Colon polyps Sister   ? Dementia Mother   ? Hypertension Mother   ? Colon polyps Mother   ? Hypertension Father   ? Arthritis Father   ? Breast cancer Maternal Aunt   ?     Age 26's  ? Diabetes Maternal Aunt   ? Diabetes Maternal Uncle   ? Ovarian cancer Maternal Aunt   ? Colon cancer Neg Hx   ? Esophageal cancer Neg Hx   ? Stomach cancer Neg Hx   ? Rectal cancer Neg Hx   ? ? ?Review of Systems:  As stated in the HPI and otherwise negative.  ? ?BP 134/80   Pulse 63   Ht '5\' 4"'$  (1.626 m)   Wt 133 lb 12.8 oz (60.7 kg)   SpO2 97%   BMI 22.97  kg/m?  ? ?Physical Examination: ?General: Well developed, well nourished, NAD  ?HEENT: OP clear, mucus membranes moist  ?SKIN: warm, dry. No rashes. ?Neuro: No focal deficits  ?Musculoskeletal: Muscle strength 5/5 all ex

## 2022-04-02 NOTE — Patient Instructions (Signed)
Medication Instructions:  No changes *If you need a refill on your cardiac medications before your next appointment, please call your pharmacy*   Lab Work: none If you have labs (blood work) drawn today and your tests are completely normal, you will receive your results only by: MyChart Message (if you have MyChart) OR A paper copy in the mail If you have any lab test that is abnormal or we need to change your treatment, we will call you to review the results.   Testing/Procedures: none   Follow-Up: At [facility], you and your health needs are our priority.  As part of our continuing mission to provide you with exceptional heart care, we have created designated Provider Care Teams.  These Care Teams include your primary Cardiologist (physician) and Advanced Practice Providers (APPs -  Physician Assistants and Nurse Practitioners) who all work together to provide you with the care you need, when you need it.   Your next appointment:   12 month(s)  The format for your next appointment:   In Person  Provider:   Christopher McAlhany, MD     Important Information About Sugar       

## 2022-04-12 DIAGNOSIS — M47812 Spondylosis without myelopathy or radiculopathy, cervical region: Secondary | ICD-10-CM | POA: Diagnosis not present

## 2022-04-12 DIAGNOSIS — R42 Dizziness and giddiness: Secondary | ICD-10-CM | POA: Diagnosis not present

## 2022-04-23 ENCOUNTER — Telehealth: Payer: Self-pay | Admitting: Family Medicine

## 2022-04-23 NOTE — Telephone Encounter (Signed)
Aware, agree with Er precautions and will see her then  Cc to pcp so aware

## 2022-04-23 NOTE — Telephone Encounter (Signed)
Please triage patient if this isn't addressed by access nurse.  Thanks.

## 2022-04-23 NOTE — Telephone Encounter (Signed)
Please see note below access nurse note; pt already scheduled to see Dr Glori Bickers on 04/24/22 at 2:30. Pt care advice was given by access nurse per note from access.

## 2022-04-23 NOTE — Telephone Encounter (Signed)
Martire,Charlton "Chuck" (Spouse) called and wanted to speak with someone about his wife [name]. Stating she is losing her balance and has some dizziness but overall has bad balance. I transferred them to Access Nurse.  Callback Number: [phone]

## 2022-04-23 NOTE — Telephone Encounter (Signed)
[facility] - Client TELEPHONE ADVICE RECORD [facility] Patient Name: [name] NE Gender: Female DOB: [birth year] Age: 76 Y 45 M Return Phone Number: [phone] (Primary), [phone] (Secondary) Address: [place]/ State/ Zip: [place] [address] Client [facility] - Client Client Site [facility] - Day Provider Renford Dills - MD Contact Type Call Who Is Calling Patient / Member / Family / Caregiver Call Type Triage / Clinical Caller Name Ryleigh Esqueda Relationship To Patient Spouse Return Phone Number [phone] (Primary) Chief Complaint Weakness, Localized Reason for Call Symptomatic / Request for [place] states his wife is having dizziness, ringing in her ear and difficulty standing she is off balance. Caller states last night she could not walk around the house ( no appt available until 7/24). (ok per charge nurse to send as urgent) Translation No Nurse Assessment Nurse: Doyle Askew, RN, Beth Date/Time (Eastern Time): 04/23/2022 1:50:50 PM Confirm and document reason for call. If symptomatic, describe symptoms. ---Caller states his wife is having dizziness, ringing in her both ears (this symptom has been addressed) and difficulty standing she is off balance. Caller states last night she could not walk around the house ( no appt available until 7/24). (ok per charge nurse to send as urgent) Does the patient have any new or worsening symptoms? ---Yes Will a triage be completed? ---Yes Related visit to physician within the last 2 weeks? ---N/A Does the PT have any chronic conditions? (i.e. diabetes, asthma, this includes High risk factors for pregnancy, etc.) ---Yes List chronic conditions. ---degenerative disc dz, pacemaker Is this a behavioral health or substance abuse call? ---No Guidelines Guideline Title Affirmed Question  Affirmed Notes Nurse Date/Time (Eastern Time) Dizziness - Lightheadedness [1] MODERATE dizziness (e.g., interferes with normal activities) Doyle Askew, Therapist, sports, [facility] 04/23/2022 1:53:25 PM PLEASE NOTE: All timestamps contained within this report are represented as [place] Standard Time. CONFIDENTIALTY NOTICE: This fax transmission is intended only for the addressee. It contains information that is legally privileged, confidential or otherwise protected from use or disclosure. If you are not the intended recipient, you are strictly prohibited from reviewing, disclosing, copying using or disseminating any of this information or taking any action in reliance on or regarding this information. If you have received this fax in error, please notify us immediately by telephone so that we can arrange for its return to [place]. Phone: [phone], Toll-Free: [phone], Fax: [phone] Page: 2 of 2 Call Id: [id number] Guidelines Guideline Title Affirmed Question Affirmed Notes Nurse Date/Time Eilene Ghazi Time) AND [2] has NOT been evaluated by physician for this (Exception: dizziness caused by heat exposure, sudden standing, or poor fluid intake) Disp. Time Eilene Ghazi Time) Disposition Final User 04/23/2022 1:47:20 PM Send to Urgent Queue Merwyn Katos 04/23/2022 2:00:11 PM See PCP within 24 Hours Yes Doyle Askew, RN, [employer] Caller Disagree/Comply Comply Caller Understands Yes PreDisposition Did not know what to do Care Advice Given Per Guideline SEE PCP WITHIN 24 HOURS: * IF OFFICE WILL BE OPEN: You need to be examined within the next 24 hours. Call your doctor (or NP/PA) when the office opens and make an appointment. DRINK FLUIDS: * Drink several glasses of fruit juice, other clear fluids or water. * This will improve hydration and blood glucose. LIE DOWN AND REST: * This will improve circulation and increase blood flow to the brain. * Lie down with feet elevated for 1 hour. CALL BACK IF: * Passes out (faints) *  You become worse CARE ADVICE given per Dizziness (Adult) guideline. Referrals REFERRED TO PCP OFFIC

## 2022-04-23 NOTE — Telephone Encounter (Signed)
Spoke with patient's husband, patient was triaged and was advised to see her PCP within 24 hours. Dr Damita Dunnings does not have anything available within that time frame and is not able to work patient in. Patient is scheduled with Dr Glori Bickers on 04/24/22.

## 2022-04-24 ENCOUNTER — Ambulatory Visit (INDEPENDENT_AMBULATORY_CARE_PROVIDER_SITE_OTHER): Payer: Medicare Other | Admitting: Family Medicine

## 2022-04-24 ENCOUNTER — Other Ambulatory Visit: Payer: Self-pay | Admitting: Family Medicine

## 2022-04-24 ENCOUNTER — Encounter: Payer: Self-pay | Admitting: Family Medicine

## 2022-04-24 VITALS — BP 150/84 | HR 72 | Temp 97.9°F | Ht 64.0 in | Wt 133.4 lb

## 2022-04-24 DIAGNOSIS — H9313 Tinnitus, bilateral: Secondary | ICD-10-CM | POA: Diagnosis not present

## 2022-04-24 DIAGNOSIS — W57XXXA Bitten or stung by nonvenomous insect and other nonvenomous arthropods, initial encounter: Secondary | ICD-10-CM | POA: Insufficient documentation

## 2022-04-24 DIAGNOSIS — H34811 Central retinal vein occlusion, right eye, with macular edema: Secondary | ICD-10-CM | POA: Diagnosis not present

## 2022-04-24 DIAGNOSIS — R42 Dizziness and giddiness: Secondary | ICD-10-CM | POA: Diagnosis not present

## 2022-04-24 DIAGNOSIS — S90561A Insect bite (nonvenomous), right ankle, initial encounter: Secondary | ICD-10-CM

## 2022-04-24 DIAGNOSIS — H9319 Tinnitus, unspecified ear: Secondary | ICD-10-CM | POA: Insufficient documentation

## 2022-04-24 NOTE — Telephone Encounter (Signed)
Noted. Thanks.

## 2022-04-24 NOTE — Assessment & Plan Note (Signed)
Small/likely deer tick, found this am  Husband got almost all out  Removed the rest with tweezers here and dressed wound  inst to watch for lesion/rash/fever/malaise or any new symptoms  Keep clean and covered if needed

## 2022-04-24 NOTE — Assessment & Plan Note (Signed)
Episode of positional dizziness on Sunday after travel  occ looses balance while walking but that may not be related  Exam completely unremarkable except for nystagmus and scab in R ear canal (no pain) No stroke s/s or exam findings  Disc differential dx of vertigo  ER precautions rev  Handout given  inst to change position slowly  No meclizine to to adv eff from benadryl  Follow closely  Pt req ENT referral for eval /tx (unsure how long wait there will be)

## 2022-04-24 NOTE — Patient Instructions (Addendum)
Change position slowly  Stay hydrated Watch for return of symptoms   The canal for your Right ear looks a little red  Don't use q tips If it hurts or bleeds let me know    I will place a referral to ENT  If you don't get a call in 2 weeks let us know

## 2022-04-24 NOTE — Progress Notes (Signed)
Subjective:    Patient ID: [name], female    DOB: 10-21-1946, 76 y.o.   MRN: [id number]  HPI 76 yo pt of Dr Damita Dunnings presents with c/o dizziness and tinnitus   Wt Readings from Last 3 Encounters:  04/24/22 133 lb 6 oz (60.5 kg)  04/02/22 133 lb 12.8 oz (60.7 kg)  03/12/22 134 lb (60.8 kg)   22.89 kg/m  Several injections for spondylosis of CS  Not helping  Neuro surg   Has noted episodes of lost balance (in the middle of walking for a split second)  Wonders if vertigo   Most recently had episode upon return from travel Sunday night  Very dizzy for several hours -husband had to help her  Got up off the sofa and everything was spinning   Is feeling ok today  A few quick fleeting moments of dizziness but none like before    Her neuro surgeon recommended she see ENT   Years ago she had some vertigo occ   No other neuro symptoms   Ringing in her ears  On and off  Not bad today  May be louder on the L side but both ears   Not a lot of allergy symptoms    No fullness/pain or popping   Has a bad cataract on one eye  Makes her a little off balance  Gets shots in eye for retinal problem also   Patient Active Problem List   Diagnosis Date Noted   Dizziness 04/24/2022   Tinnitus 04/24/2022   Tick bite of ankle 04/24/2022   Thrush, oral 12/12/2021   Vitamin D deficiency 09/10/2021   HTN (hypertension) 06/11/2018   Subdural hematoma ([facility]) 09/26/2017   Health care maintenance 05/10/2016   History of thyroid disease 05/10/2016   Osteoarthritis of neck 05/10/2016   S/P total knee arthroplasty 12/20/2014   Medicare annual wellness visit, subsequent 10/12/2014   Advance care planning 10/12/2014   Palpitations 09/23/2013   Fatigue 08/07/2013   Alopecia 05/11/2013   Benign hypertensive heart disease without heart failure 01/07/2013   Pacemaker-Medtronic 10/08/2012   Hiatal hernia 05/28/2012   Esophageal stricture 05/28/2012   Dysuria 04/03/2012    Osteoporosis screening 01/16/2012   Anxiety 01/16/2012   Sick sinus syndrome ([place])    Osteoarthritis of knee 04/19/2011   Internal hemorrhoids 02/28/2011   Constipation 02/28/2011   GERD 06/28/2009   Hypercholesterolemia 03/30/2008   Obstructive sleep apnea 03/30/2008   History of SCC (squamous cell carcinoma) of skin 03/30/2008   Past Medical History:  Diagnosis Date   Adenomatous colon polyp 12/2006   Allergic rhinitis    Anemia    Anxiety    Appendicitis 04/2020   Arthritis    Bradycardia    s/p MDT PPM   Cataract    bilateral   Depression    Esophageal stricture    Family history of adverse reaction to anesthesia    maternal uncle with MH, nephew had MH during surgery and was OK, pt not been tested   GERD (gastroesophageal reflux disease)    Hemorrhoids    Hyperlipidemia    Hypertension    Inguinal hernia    Osteoporosis    Presence of permanent cardiac pacemaker 2006   SSS   PVC (premature ventricular contraction)    Scleritis of right eye    Skin cancer    Basal and squamous cell   Sleep apnea    a. intolerant of CPAP, does wear a mouthpiece   Subdural  hematoma ([place])    a. spontanous 09/2017 b. resolved without intervention   Thyroid disease    Vitreous detachment of both eyes    Past Surgical History:  Procedure Laterality Date   ABDOMINAL HYSTERECTOMY     APPENDECTOMY     BREAST BIOPSY Left 01/15/2014   BREAST SURGERY     Biopsy-benign   CESAREAN SECTION     COLONOSCOPY  2018   DILATION AND CURETTAGE OF UTERUS     ESOPHAGOGASTRODUODENOSCOPY     esophageal stretching   HAND SURGERY  1992   HERNIA REPAIR     INGUINAL HERNIA REPAIR  02/12/1994   left inguinal hernia repair   KNEE ARTHROSCOPY  2011   LAPAROSCOPIC APPENDECTOMY N/A 11/29/2020   Procedure: APPENDECTOMY LAPAROSCOPIC;  Surgeon: Jesusita Oka, MD;  Location: [facility];  Service: General;  Laterality: N/A;   OOPHORECTOMY  1995   LSO and RSO   PACEMAKER GENERATOR  CHANGE N/A 01/21/2015   Procedure: PACEMAKER GENERATOR CHANGE;  Surgeon: [name], MD;  Location: [facility] CATH LAB;  Service: Cardiovascular;  Laterality: N/A;   PACEMAKER INSERTION  2006   Dr. Verlon Setting   POLYPECTOMY     Tear duct surgery  2010   [place]   RIGHT SIDE   TOTAL KNEE ARTHROPLASTY Right 12/20/2014   Procedure: RIGHT TOTAL KNEE ARTHROPLASTY;  Surgeon: Vickey Huger, MD;  Location: [place] [facility];  Service: Orthopedics;  Laterality: Right;   UPPER GASTROINTESTINAL ENDOSCOPY     unsure   Social History   Tobacco Use   Smoking status: Never   Smokeless tobacco: Never  Vaping Use   Vaping Use: Never used  Substance Use Topics   Alcohol use: No    Alcohol/week: 0.0 standard drinks   Drug use: No   Family History  Problem Relation Age of Onset   Heart failure Sister    Heart disease Sister    Hypertension Sister    Colon polyps Sister    Dementia Mother    Hypertension Mother    Colon polyps Mother    Hypertension Father    Arthritis Father    Breast cancer Maternal Aunt        Age 19's   Diabetes Maternal Aunt    Diabetes Maternal Uncle    Ovarian cancer Maternal Aunt    Colon cancer Neg Hx    Esophageal cancer Neg Hx    Stomach cancer Neg Hx    Rectal cancer Neg Hx    Allergies  Allergen Reactions   Hydrocodone     Increased HR   Antihistamines, Loratadine-Type Other (See Comments)    Heart races/jittery   Bisoprolol-Hydrochlorothiazide Other (See Comments)    Couldn't tolerate UNKNOWN REACTION   Cephalexin Nausea And Vomiting   Codeine Other (See Comments)    Increased heart rate, anxiety   Demerol [Meperidine] Other (See Comments)    Increased heart rate, anxiety   Epinephrine Other (See Comments)    Increased heart rate   Erythromycin Nausea And Vomiting   Hyoscyamine Sulfate Other (See Comments)    Pt could not tolerate this med UNKNOWN reaction   Inderal [Propranolol] Other (See Comments)    Extreme tiredness   Lidocaine  Other (See Comments)    Family hx of malignant hyperthermia   Lipitor [Atorvastatin] Other (See Comments)    Muscle aches   Losartan Potassium Other (See Comments)    Pt could not tolerate this med UNKNOWN REACTION  Morphine Other (See Comments)    Nightmares, increased BP   Nebivolol Other (See Comments)    angioedema   Paxil [Paroxetine Hcl] Other (See Comments)    "drove her up the wall"   Penicillins Nausea And Vomiting   Prednisone Other (See Comments)    Tolerates low doses if absolutely necessary - makes her nervous, jittery   Propranolol Hcl Other (See Comments)    Pt could not tolerate this med UNKNOWN REACTION   Sertraline Hcl Other (See Comments)    Pt could not tolerate this med UNKNOWN REACTION   Tetracycline Nausea And Vomiting   Toprol Xl [Metoprolol Succinate] Other (See Comments)    Extreme tiredness   Verapamil Other (See Comments)    Pt could not tolerate this med UNKNOWN REACTION   Hydralazine     headaches   Oxycodone-Acetaminophen Palpitations    Causes accelerated heart rate   Current Outpatient Medications on File Prior to Visit  Medication Sig Dispense Refill   acetaminophen (TYLENOL) 500 MG tablet Take 1,000 mg by mouth every 6 (six) hours as needed (pain).     Carboxymethylcell-Hypromellose 0.25-0.3 % GEL Place 1 application into both eyes at bedtime.      Cholecalciferol (VITAMIN D3) 25 MCG (1000 UT) CAPS Take 1 capsule (1,000 Units total) by mouth daily.     diltiazem (CARTIA XT) 120 MG 24 hr capsule Take 1 capsule (120 mg total) by mouth daily. Please make yearly appt with Dr. Angelena Form for March 2023 for future refills. Thank you 1st attempt. Please keep upcoming appointment in May 2023 for future refills. Thank you 90 capsule 0   famotidine (PEPCID) 20 MG tablet Take 1 tablet (20 mg total) by mouth 2 (two) times daily. 180 tablet 3   hydrochlorothiazide (HYDRODIURIL) 12.5 MG tablet Take 1 tablet (12.5 mg total) by mouth every other day.      LORazepam (ATIVAN) 1 MG tablet TAKE 1 TABLET BY MOUTH EVERY 6 HOURS AS NEEDED 120 tablet 2   Polyethyl Glycol-Propyl Glycol (SYSTANE OP) Place 1 drop into both eyes daily as needed (dry eyes).      No current facility-administered medications on file prior to visit.    Review of Systems  Constitutional:  Negative for activity change, appetite change, fatigue, fever and unexpected weight change.  HENT:  Negative for congestion, ear pain, rhinorrhea, sinus pressure and sore throat.   Eyes:  Positive for visual disturbance. Negative for pain and redness.       Cataract  Respiratory:  Negative for cough, shortness of breath and wheezing.   Cardiovascular:  Negative for chest pain and palpitations.  Gastrointestinal:  Negative for abdominal pain, blood in stool, constipation and diarrhea.  Endocrine: Negative for polydipsia and polyuria.  Genitourinary:  Negative for dysuria, frequency and urgency.  Musculoskeletal:  Negative for arthralgias, back pain and myalgias.  Skin:  Negative for color change, pallor and rash.       Tick bite R ankle   Allergic/Immunologic: Negative for environmental allergies.  Neurological:  Positive for dizziness. Negative for tremors, seizures, syncope, facial asymmetry, speech difficulty, weakness, light-headedness, numbness and headaches.  Hematological:  Negative for adenopathy. Does not bruise/bleed easily.  Psychiatric/Behavioral:  Negative for decreased concentration and dysphoric mood. The patient is not nervous/anxious.       Objective:   Physical Exam Constitutional:      General: She is not in acute distress.    Appearance: Normal appearance. She is well-developed and normal weight. She is not  ill-appearing or diaphoretic.  HENT:     Head: Normocephalic and atraumatic.     Right Ear: Tympanic membrane and external ear normal.     Left Ear: Tympanic membrane, ear canal and external ear normal.     Ears:     Comments: Small scab/dried blood in R ear  canal No tenderness or swelling or discharge    Nose: Nose normal.     Mouth/Throat:     Mouth: Mucous membranes are moist.     Pharynx: Oropharynx is clear. No oropharyngeal exudate.  Eyes:     General: No scleral icterus.       Right eye: No discharge.        Left eye: No discharge.     Conjunctiva/sclera: Conjunctivae normal.     Pupils: Pupils are equal, round, and reactive to light.     Comments: 2-3 beats of horiz nystagmus noted bilaterally  Neck:     Thyroid: No thyromegaly.     Vascular: No carotid bruit or JVD.     Trachea: No tracheal deviation.  Cardiovascular:     Rate and Rhythm: Normal rate and regular rhythm.     Heart sounds: Normal heart sounds. No murmur heard. Pulmonary:     Effort: Pulmonary effort is normal. No respiratory distress.     Breath sounds: Normal breath sounds. No wheezing or rales.  Abdominal:     General: Bowel sounds are normal. There is no distension.     Palpations: Abdomen is soft. There is no mass.     Tenderness: There is no abdominal tenderness.  Musculoskeletal:        General: No tenderness.     Cervical back: Full passive range of motion without pain, normal range of motion and neck supple.     Right lower leg: No edema.     Left lower leg: No edema.  Lymphadenopathy:     Cervical: No cervical adenopathy.  Skin:    General: Skin is warm and dry.     Coloration: Skin is not jaundiced or pale.     Findings: No bruising, erythema or rash.     Comments: Part of tiny tick noted embedded in R lateral ankle below malleolus   After consent obtained, cleaned with alcohol and removed with tweezers Pt tolerated well  Dressed with band aid    No rashes  No erythema at tick bite site   Neurological:     Mental Status: She is alert and oriented to person, place, and time.     Cranial Nerves: Cranial nerves 2-12 are intact. No cranial nerve deficit, dysarthria or facial asymmetry.     Sensory: Sensation is intact. No sensory deficit.      Motor: No weakness, tremor, atrophy, abnormal muscle tone, seizure activity or pronator drift.     Coordination: Coordination is intact. Romberg sign negative. Coordination normal. Finger-Nose-Finger Test normal. Rapid alternating movements normal.     Gait: Gait is intact. Gait and tandem walk normal.     Deep Tendon Reflexes: Reflexes are normal and symmetric. Reflexes normal.     Comments: No focal cerebellar signs   Psychiatric:        Behavior: Behavior normal.        Thought Content: Thought content normal.     Comments: Pleasant  Talkative           Assessment & Plan:   Problem List Items Addressed This Visit       Musculoskeletal and Integument  Tick bite of ankle    Small/likely deer tick, found this am  Husband got almost all out  Removed the rest with tweezers here and dressed wound  inst to watch for lesion/rash/fever/malaise or any new symptoms  Keep clean and covered if needed          Other   Dizziness - Primary    Episode of positional dizziness on Sunday after travel  occ looses balance while walking but that may not be related  Exam completely unremarkable except for nystagmus and scab in R ear canal (no pain) No stroke s/s or exam findings  Disc differential dx of vertigo  ER precautions rev  Handout given  inst to change position slowly  No meclizine to to adv eff from benadryl  Follow closely  Pt req ENT referral for eval /tx (unsure how long wait there will be)         Tinnitus    Intermittent Both ears occ louder in L   Some scab in R ear canal Pt thinks from q tip, no pain or indication of zoster   ENT referral done

## 2022-04-24 NOTE — Assessment & Plan Note (Signed)
Intermittent Both ears occ louder in L   Some scab in R ear canal Pt thinks from q tip, no pain or indication of zoster   ENT referral done

## 2022-04-25 NOTE — Telephone Encounter (Signed)
Refill request for LORazepam 1 MG Oral Tablet  LOV - 04/24/22 Next OV - not scheduled Last refill - 01/26/22 #120/2

## 2022-04-26 ENCOUNTER — Other Ambulatory Visit: Payer: Self-pay | Admitting: Family Medicine

## 2022-04-26 DIAGNOSIS — Z1231 Encounter for screening mammogram for malignant neoplasm of breast: Secondary | ICD-10-CM

## 2022-05-02 ENCOUNTER — Ambulatory Visit (INDEPENDENT_AMBULATORY_CARE_PROVIDER_SITE_OTHER): Payer: Medicare Other

## 2022-05-02 DIAGNOSIS — I495 Sick sinus syndrome: Secondary | ICD-10-CM | POA: Diagnosis not present

## 2022-05-03 LAB — CUP PACEART REMOTE DEVICE CHECK
Battery Impedance: 516 Ohm
Battery Remaining Longevity: 97 mo
Battery Voltage: 2.78 V
Brady Statistic AP VP Percent: 0 %
Brady Statistic AP VS Percent: 53 %
Brady Statistic AS VP Percent: 0 %
Brady Statistic AS VS Percent: 47 %
Date Time Interrogation Session: 20230531100105
Implantable Lead Implant Date: 20060620
Implantable Lead Implant Date: 20060620
Implantable Lead Location: 753859
Implantable Lead Location: 753860
Implantable Lead Model: 5076
Implantable Lead Model: 5076
Implantable Pulse Generator Implant Date: 20160219
Lead Channel Impedance Value: 697 Ohm
Lead Channel Impedance Value: 819 Ohm
Lead Channel Pacing Threshold Amplitude: 0.625 V
Lead Channel Pacing Threshold Amplitude: 0.875 V
Lead Channel Pacing Threshold Pulse Width: 0.4 ms
Lead Channel Pacing Threshold Pulse Width: 0.4 ms
Lead Channel Setting Pacing Amplitude: 2 V
Lead Channel Setting Pacing Amplitude: 2.5 V
Lead Channel Setting Pacing Pulse Width: 0.4 ms
Lead Channel Setting Sensing Sensitivity: 5.6 mV

## 2022-05-04 ENCOUNTER — Encounter: Payer: Self-pay | Admitting: Internal Medicine

## 2022-05-07 ENCOUNTER — Encounter: Payer: Self-pay | Admitting: Family Medicine

## 2022-05-07 ENCOUNTER — Ambulatory Visit (INDEPENDENT_AMBULATORY_CARE_PROVIDER_SITE_OTHER): Payer: Medicare Other | Admitting: Family Medicine

## 2022-05-07 VITALS — BP 120/80 | HR 78 | Temp 98.0°F | Ht 64.0 in | Wt 133.0 lb

## 2022-05-07 DIAGNOSIS — E559 Vitamin D deficiency, unspecified: Secondary | ICD-10-CM | POA: Diagnosis not present

## 2022-05-07 DIAGNOSIS — M47812 Spondylosis without myelopathy or radiculopathy, cervical region: Secondary | ICD-10-CM | POA: Diagnosis not present

## 2022-05-07 DIAGNOSIS — R5383 Other fatigue: Secondary | ICD-10-CM

## 2022-05-07 DIAGNOSIS — R42 Dizziness and giddiness: Secondary | ICD-10-CM

## 2022-05-07 NOTE — Progress Notes (Unsigned)
She is still having soreness in the R upper back along the R trap.  She is considering another injection. Some days the pain is better/worse than others.  She has pain with look down, ie neck flexion.    She has tried taking vit D but had GI sx with use- abd pain and nausea at the time.  D/w pt about trying liquid vit D- she asked about that.  See avs.  She could start at 200 units a day and gradually work up if tolerated.    Dizziness is better in the meantime except she had a positional episode after standing and turning.  Sx started after turning, not after standing up.  She has ENT appointment, potentially in June at [facility].    Fatigue and bruising on extensor surface forearms.  No epistaxis, bleeding gums.   Meds, vitals, and allergies reviewed.   ROS: Per HPI unless specifically indicated in ROS section

## 2022-05-07 NOTE — Patient Instructions (Addendum)
13m is 1 teaspoon.  Check the concentration on the vitamin D.  Goal would be 1000 units a day.  Don't check vitamin D today.   Go to the lab on the way out.   If you have mychart we'll likely use that to update you.     Call the ENT clinic about getting the appointment this month.    Take care.  Glad to see you.

## 2022-05-08 LAB — CBC WITH DIFFERENTIAL/PLATELET
Basophils Absolute: 0 10*3/uL (ref 0.0–0.1)
Basophils Relative: 0.8 % (ref 0.0–3.0)
Eosinophils Absolute: 0 10*3/uL (ref 0.0–0.7)
Eosinophils Relative: 0.8 % (ref 0.0–5.0)
HCT: 39.1 % (ref 36.0–46.0)
Hemoglobin: 13.5 g/dL (ref 12.0–15.0)
Lymphocytes Relative: 23.9 % (ref 12.0–46.0)
Lymphs Abs: 1.1 10*3/uL (ref 0.7–4.0)
MCHC: 34.6 g/dL (ref 30.0–36.0)
MCV: 96.7 fl (ref 78.0–100.0)
Monocytes Absolute: 0.5 10*3/uL (ref 0.1–1.0)
Monocytes Relative: 9.8 % (ref 3.0–12.0)
Neutro Abs: 3 10*3/uL (ref 1.4–7.7)
Neutrophils Relative %: 64.7 % (ref 43.0–77.0)
Platelets: 238 10*3/uL (ref 150.0–400.0)
RBC: 4.04 Mil/uL (ref 3.87–5.11)
RDW: 12.8 % (ref 11.5–15.5)
WBC: 4.7 10*3/uL (ref 4.0–10.5)

## 2022-05-08 LAB — TSH: TSH: 1.9 u[IU]/mL (ref 0.35–5.50)

## 2022-05-08 LAB — COMPREHENSIVE METABOLIC PANEL
ALT: 13 U/L (ref 0–35)
AST: 16 U/L (ref 0–37)
Albumin: 4.4 g/dL (ref 3.5–5.2)
Alkaline Phosphatase: 46 U/L (ref 39–117)
BUN: 16 mg/dL (ref 6–23)
CO2: 30 mEq/L (ref 19–32)
Calcium: 9.4 mg/dL (ref 8.4–10.5)
Chloride: 95 mEq/L — ABNORMAL LOW (ref 96–112)
Creatinine, Ser: 0.8 mg/dL (ref 0.40–1.20)
GFR: 72.57 mL/min (ref >60.00)
Glucose, Bld: 83 mg/dL (ref 70–99)
Potassium: 4 mEq/L (ref 3.5–5.1)
Sodium: 133 mEq/L — ABNORMAL LOW (ref 135–145)
Total Bilirubin: 0.5 mg/dL (ref 0.2–1.2)
Total Protein: 6.8 g/dL (ref 6.0–8.3)

## 2022-05-09 NOTE — Assessment & Plan Note (Signed)
I think it makes sense to see ENT and they will check on getting the appointment at [facility] this month.

## 2022-05-09 NOTE — Assessment & Plan Note (Signed)
She can get liquid vitamin D and gradually try to increase the dose as tolerated.  Discussed.

## 2022-05-09 NOTE — Assessment & Plan Note (Signed)
Discussed that follow-up injection at a different site may be useful for the patient and I think it makes sense to at least consider this with the neurosurgery clinic.

## 2022-05-09 NOTE — Assessment & Plan Note (Signed)
With senile purpura noted.  Reasonable to check CBC and routine labs.  See notes on labs.

## 2022-05-10 ENCOUNTER — Other Ambulatory Visit: Payer: Medicare Other

## 2022-05-15 ENCOUNTER — Other Ambulatory Visit: Payer: Self-pay

## 2022-05-15 DIAGNOSIS — I119 Hypertensive heart disease without heart failure: Secondary | ICD-10-CM

## 2022-05-15 MED ORDER — DILTIAZEM HCL ER COATED BEADS 120 MG PO CP24: 120.0000 mg | ORAL_CAPSULE | Freq: Every day | ORAL | 3 refills | Status: DC

## 2022-05-15 NOTE — Progress Notes (Signed)
Remote pacemaker transmission.   

## 2022-05-22 DIAGNOSIS — R42 Dizziness and giddiness: Secondary | ICD-10-CM | POA: Diagnosis not present

## 2022-05-22 DIAGNOSIS — H903 Sensorineural hearing loss, bilateral: Secondary | ICD-10-CM | POA: Diagnosis not present

## 2022-05-28 DIAGNOSIS — M47812 Spondylosis without myelopathy or radiculopathy, cervical region: Secondary | ICD-10-CM | POA: Diagnosis not present

## 2022-06-08 ENCOUNTER — Ambulatory Visit
Admission: RE | Admit: 2022-06-08 | Discharge: 2022-06-08 | Disposition: A | Discharge status: Home / Self Care | Payer: Medicare Other | Source: Ambulatory Visit | Attending: Family Medicine | Admitting: Family Medicine

## 2022-06-08 DIAGNOSIS — Z1231 Encounter for screening mammogram for malignant neoplasm of breast: Secondary | ICD-10-CM

## 2022-06-20 DIAGNOSIS — M47812 Spondylosis without myelopathy or radiculopathy, cervical region: Secondary | ICD-10-CM | POA: Diagnosis not present

## 2022-07-09 DIAGNOSIS — H34811 Central retinal vein occlusion, right eye, with macular edema: Secondary | ICD-10-CM | POA: Diagnosis not present

## 2022-07-23 DIAGNOSIS — M47812 Spondylosis without myelopathy or radiculopathy, cervical region: Secondary | ICD-10-CM | POA: Diagnosis not present

## 2022-07-25 DIAGNOSIS — H3582 Retinal ischemia: Secondary | ICD-10-CM | POA: Diagnosis not present

## 2022-07-25 DIAGNOSIS — H04123 Dry eye syndrome of bilateral lacrimal glands: Secondary | ICD-10-CM | POA: Diagnosis not present

## 2022-07-25 DIAGNOSIS — H43812 Vitreous degeneration, left eye: Secondary | ICD-10-CM | POA: Diagnosis not present

## 2022-07-25 DIAGNOSIS — H35033 Hypertensive retinopathy, bilateral: Secondary | ICD-10-CM | POA: Diagnosis not present

## 2022-07-25 DIAGNOSIS — H34811 Central retinal vein occlusion, right eye, with macular edema: Secondary | ICD-10-CM | POA: Diagnosis not present

## 2022-07-25 DIAGNOSIS — H25811 Combined forms of age-related cataract, right eye: Secondary | ICD-10-CM | POA: Diagnosis not present

## 2022-07-26 ENCOUNTER — Telehealth: Payer: Self-pay | Admitting: *Deleted

## 2022-07-26 NOTE — Patient Outreach (Signed)
  Care Coordination   07/26/2022 Name: [name] MRN: [id number] DOB: 01-04-1946   Care Coordination Outreach Attempts:  An unsuccessful telephone outreach was attempted today to offer the patient information about available care coordination services as a benefit of their health plan.   Follow Up Plan:  Additional outreach attempts will be made to offer the patient care coordination information and services.   Encounter Outcome:  No Answer  Care Coordination Interventions Activated:  Yes   Care Coordination Interventions:  No, not indicated    [facility] [phone]

## 2022-07-31 ENCOUNTER — Other Ambulatory Visit: Payer: Self-pay | Admitting: Family Medicine

## 2022-07-31 NOTE — Telephone Encounter (Signed)
Refill request for LORazepam 1 MG Oral Tablet  LOV - 05/07/22 Next OV - 09/06/22 Last refill - 04/25/22 #120/2

## 2022-08-01 ENCOUNTER — Ambulatory Visit (INDEPENDENT_AMBULATORY_CARE_PROVIDER_SITE_OTHER): Payer: Medicare Other

## 2022-08-01 DIAGNOSIS — I495 Sick sinus syndrome: Secondary | ICD-10-CM | POA: Diagnosis not present

## 2022-08-05 LAB — CUP PACEART REMOTE DEVICE CHECK
Battery Impedance: 565 Ohm
Battery Remaining Longevity: 93 mo
Battery Voltage: 2.78 V
Brady Statistic AP VP Percent: 0 %
Brady Statistic AP VS Percent: 54 %
Brady Statistic AS VP Percent: 0 %
Brady Statistic AS VS Percent: 46 %
Date Time Interrogation Session: 20230830105501
Implantable Lead Implant Date: 20060620
Implantable Lead Implant Date: 20060620
Implantable Lead Location: 753859
Implantable Lead Location: 753860
Implantable Lead Model: 5076
Implantable Lead Model: 5076
Implantable Pulse Generator Implant Date: 20160219
Lead Channel Impedance Value: 647 Ohm
Lead Channel Impedance Value: 805 Ohm
Lead Channel Pacing Threshold Amplitude: 0.5 V
Lead Channel Pacing Threshold Amplitude: 0.625 V
Lead Channel Pacing Threshold Pulse Width: 0.4 ms
Lead Channel Pacing Threshold Pulse Width: 0.4 ms
Lead Channel Setting Pacing Amplitude: 2 V
Lead Channel Setting Pacing Amplitude: 2.5 V
Lead Channel Setting Pacing Pulse Width: 0.4 ms
Lead Channel Setting Sensing Sensitivity: 5.6 mV

## 2022-08-07 DIAGNOSIS — H348112 Central retinal vein occlusion, right eye, stable: Secondary | ICD-10-CM | POA: Diagnosis not present

## 2022-08-07 DIAGNOSIS — H25813 Combined forms of age-related cataract, bilateral: Secondary | ICD-10-CM | POA: Diagnosis not present

## 2022-08-07 DIAGNOSIS — H524 Presbyopia: Secondary | ICD-10-CM | POA: Diagnosis not present

## 2022-08-07 DIAGNOSIS — H349 Unspecified retinal vascular occlusion: Secondary | ICD-10-CM | POA: Diagnosis not present

## 2022-08-07 DIAGNOSIS — H35363 Drusen (degenerative) of macula, bilateral: Secondary | ICD-10-CM | POA: Diagnosis not present

## 2022-08-16 DIAGNOSIS — H269 Unspecified cataract: Secondary | ICD-10-CM | POA: Diagnosis not present

## 2022-08-16 DIAGNOSIS — H2511 Age-related nuclear cataract, right eye: Secondary | ICD-10-CM | POA: Diagnosis not present

## 2022-08-24 DIAGNOSIS — M47812 Spondylosis without myelopathy or radiculopathy, cervical region: Secondary | ICD-10-CM | POA: Diagnosis not present

## 2022-08-24 NOTE — Progress Notes (Signed)
Remote pacemaker transmission.   

## 2022-08-26 ENCOUNTER — Other Ambulatory Visit: Payer: Self-pay | Admitting: Family Medicine

## 2022-08-26 DIAGNOSIS — I10 Essential (primary) hypertension: Secondary | ICD-10-CM

## 2022-08-26 DIAGNOSIS — E559 Vitamin D deficiency, unspecified: Secondary | ICD-10-CM

## 2022-08-28 DIAGNOSIS — L820 Inflamed seborrheic keratosis: Secondary | ICD-10-CM | POA: Diagnosis not present

## 2022-08-28 DIAGNOSIS — D1801 Hemangioma of skin and subcutaneous tissue: Secondary | ICD-10-CM | POA: Diagnosis not present

## 2022-08-28 DIAGNOSIS — Z85828 Personal history of other malignant neoplasm of skin: Secondary | ICD-10-CM | POA: Diagnosis not present

## 2022-08-28 DIAGNOSIS — L821 Other seborrheic keratosis: Secondary | ICD-10-CM | POA: Diagnosis not present

## 2022-08-28 DIAGNOSIS — D2272 Melanocytic nevi of left lower limb, including hip: Secondary | ICD-10-CM | POA: Diagnosis not present

## 2022-08-28 DIAGNOSIS — L570 Actinic keratosis: Secondary | ICD-10-CM | POA: Diagnosis not present

## 2022-08-28 DIAGNOSIS — L814 Other melanin hyperpigmentation: Secondary | ICD-10-CM | POA: Diagnosis not present

## 2022-08-30 ENCOUNTER — Ambulatory Visit (INDEPENDENT_AMBULATORY_CARE_PROVIDER_SITE_OTHER): Payer: Medicare Other

## 2022-08-30 ENCOUNTER — Other Ambulatory Visit (INDEPENDENT_AMBULATORY_CARE_PROVIDER_SITE_OTHER): Payer: Medicare Other

## 2022-08-30 DIAGNOSIS — E559 Vitamin D deficiency, unspecified: Secondary | ICD-10-CM

## 2022-08-30 DIAGNOSIS — Z23 Encounter for immunization: Secondary | ICD-10-CM

## 2022-08-30 DIAGNOSIS — I10 Essential (primary) hypertension: Secondary | ICD-10-CM

## 2022-08-30 LAB — VITAMIN D 25 HYDROXY (VIT D DEFICIENCY, FRACTURES): VITD: 20.65 ng/mL — ABNORMAL LOW (ref 30.00–100.00)

## 2022-08-30 LAB — LIPID PANEL
Cholesterol: 237 mg/dL — ABNORMAL HIGH (ref 0–200)
HDL: 66.5 mg/dL (ref >39.00)
LDL Cholesterol: 148 mg/dL — ABNORMAL HIGH (ref 0–99)
NonHDL: 170.15
Total CHOL/HDL Ratio: 4
Triglycerides: 109 mg/dL (ref 0.0–149.0)
VLDL: 21.8 mg/dL (ref 0.0–40.0)

## 2022-09-06 ENCOUNTER — Ambulatory Visit (INDEPENDENT_AMBULATORY_CARE_PROVIDER_SITE_OTHER): Payer: Medicare Other | Admitting: Family Medicine

## 2022-09-06 ENCOUNTER — Encounter: Payer: Self-pay | Admitting: Family Medicine

## 2022-09-06 VITALS — BP 144/78 | HR 60 | Temp 97.6°F | Ht 63.0 in | Wt 140.0 lb

## 2022-09-06 DIAGNOSIS — R202 Paresthesia of skin: Secondary | ICD-10-CM

## 2022-09-06 DIAGNOSIS — M47812 Spondylosis without myelopathy or radiculopathy, cervical region: Secondary | ICD-10-CM

## 2022-09-06 DIAGNOSIS — Z0000 Encounter for general adult medical examination without abnormal findings: Secondary | ICD-10-CM

## 2022-09-06 DIAGNOSIS — I10 Essential (primary) hypertension: Secondary | ICD-10-CM | POA: Diagnosis not present

## 2022-09-06 DIAGNOSIS — E559 Vitamin D deficiency, unspecified: Secondary | ICD-10-CM | POA: Diagnosis not present

## 2022-09-06 DIAGNOSIS — Z7189 Other specified counseling: Secondary | ICD-10-CM

## 2022-09-06 MED ORDER — VITAMIN D3 25 MCG (1000 UT) PO CAPS: 2000.0000 [IU] | ORAL_CAPSULE | Freq: Every day | ORAL | Status: DC

## 2022-09-06 NOTE — Progress Notes (Signed)
I have personally reviewed the Medicare Annual Wellness questionnaire and have noted 1. The patient's medical and social history 2. Their use of alcohol, tobacco or illicit drugs 3. Their current medications and supplements 4. The patient's functional ability including ADL's, fall risks, home safety risks and hearing or visual             impairment. 5. Diet and physical activities 6. Evidence for depression or mood disorders  The patients weight, height, BMI have been recorded in the chart and visual acuity is per eye clinic.  I have made referrals, counseling and provided education to the patient based review of the above and I have provided the pt with a written personalized care plan for preventive services.  Provider list updated- see scanned forms.  Routine anticipatory guidance given to patient.  See health maintenance. The possibility exists that previously documented standard health maintenance information may have been brought forward from a previous encounter into this note.  If needed, that same information has been updated to reflect the current situation based on today's encounter.    Flu 2023 Shingles discussed with patient Zostavax previously done PNA previously done Tetanus 2019 COVID-vaccine previously done Colonoscopy 2021 Breast cancer screening 2023 Bone density test deferred given low vitamin D level, 2023. Advance directive-husband designated if patient were incapacitated. Cognitive function addressed- see scanned forms- and if abnormal then additional documentation follows.   In addition to [facility], follow up visit for the below conditions:  Hypertension:    Using medication without problems or lightheadedness:yes  Chest pain with exertion:no Edema: minimal, in the ankles.  Short of breath:no  Vit D def.  D/w pt about inc dose and recheck in about 3 months.    Mood and neck pain d/w pt.  She has had multiple injections.  No help so far with  injection.  We talked about technetium scan per neurosurgery.     Woke up with R knee pain about 11 days ago.  Now with burning in the shins B noted at night.  Sometimes noted in the days but more at night.  Not noted in the feet. Skin with normal inspection.  She had lower back pain at the time.  She has an old mattress.  D/w pt about options.    PMH and SH reviewed  Meds, vitals, and allergies reviewed.   ROS: Per HPI.  Unless specifically indicated otherwise in HPI, the patient denies:  General: fever. Eyes: acute vision changes ENT: sore throat Cardiovascular: chest pain Respiratory: SOB GI: vomiting GU: dysuria Musculoskeletal: acute back pain Derm: acute rash Neuro: acute motor dysfunction Psych: worsening mood Endocrine: polydipsia Heme: bleeding Allergy: hayfever  GEN: nad, alert and oriented HEENT: ncat NECK: supple w/o LA CV: rrr. PULM: ctab, no inc wob ABD: soft, +bs EXT: no edema SKIN: no acute rash No motor deficit in lower extremities on gross check.

## 2022-09-06 NOTE — Patient Instructions (Addendum)
Increase the vitamin D and see about follow up for your scan.  Take care.  Glad to see you. Recheck vit D in about 3 months.  Nonfasting lab visit.   Let me know if changing mattresses doesn't help.   Go to the lab on the way out.   If you have mychart we'll likely use that to update you.

## 2022-09-07 LAB — VITAMIN B12: Vitamin B-12: 840 pg/mL (ref 211–911)

## 2022-09-09 DIAGNOSIS — R202 Paresthesia of skin: Secondary | ICD-10-CM | POA: Insufficient documentation

## 2022-09-09 NOTE — Assessment & Plan Note (Signed)
Continue diltiazem.  Labs discussed with patient.

## 2022-09-09 NOTE — Assessment & Plan Note (Signed)
Could be related to mechanical factors, old mattress.  Check B12.  I asked her to update me as needed.

## 2022-09-09 NOTE — Assessment & Plan Note (Signed)
Advance directive- husband designated if patient were incapacitated.  

## 2022-09-09 NOTE — Assessment & Plan Note (Signed)
  Vit D def.  D/w pt about inc dose and recheck in about 3 months.

## 2022-09-09 NOTE — Assessment & Plan Note (Signed)
Flu 2023 Shingles discussed with patient Zostavax previously done PNA previously done Tetanus 2019 COVID-vaccine previously done Colonoscopy 2021 Breast cancer screening 2023 Bone density test deferred given low vitamin D level, 2023. Advance directive-husband designated if patient were incapacitated. Cognitive function addressed- see scanned forms- and if abnormal then additional documentation follows.

## 2022-09-09 NOTE — Assessment & Plan Note (Signed)
We talked about the rationale for the nuclear medicine scan and I think this makes sense to get done, per neurosurgery recommendation.

## 2022-09-12 ENCOUNTER — Other Ambulatory Visit: Payer: Self-pay | Admitting: Neurological Surgery

## 2022-09-12 ENCOUNTER — Other Ambulatory Visit (HOSPITAL_COMMUNITY): Payer: Self-pay | Admitting: Neurological Surgery

## 2022-09-12 DIAGNOSIS — M47812 Spondylosis without myelopathy or radiculopathy, cervical region: Secondary | ICD-10-CM

## 2022-09-12 DIAGNOSIS — R5381 Other malaise: Secondary | ICD-10-CM

## 2022-09-21 DIAGNOSIS — H35351 Cystoid macular degeneration, right eye: Secondary | ICD-10-CM | POA: Diagnosis not present

## 2022-09-24 ENCOUNTER — Encounter (HOSPITAL_COMMUNITY)
Admission: RE | Admit: 2022-09-24 | Discharge: 2022-09-24 | Disposition: A | Discharge status: Home / Self Care | Payer: Medicare Other | Source: Ambulatory Visit | Attending: Neurological Surgery | Admitting: Neurological Surgery

## 2022-09-24 DIAGNOSIS — M19011 Primary osteoarthritis, right shoulder: Secondary | ICD-10-CM | POA: Diagnosis not present

## 2022-09-24 DIAGNOSIS — Z85828 Personal history of other malignant neoplasm of skin: Secondary | ICD-10-CM | POA: Diagnosis not present

## 2022-09-24 DIAGNOSIS — M47812 Spondylosis without myelopathy or radiculopathy, cervical region: Secondary | ICD-10-CM | POA: Insufficient documentation

## 2022-09-24 DIAGNOSIS — M47814 Spondylosis without myelopathy or radiculopathy, thoracic region: Secondary | ICD-10-CM | POA: Diagnosis not present

## 2022-09-24 MED ORDER — TECHNETIUM TC 99M MEDRONATE IV KIT
20.0000 | PACK | Freq: Once | INTRAVENOUS | Status: AC | PRN
  Administered 2022-09-24: 20 via INTRAVENOUS

## 2022-09-26 DIAGNOSIS — H34811 Central retinal vein occlusion, right eye, with macular edema: Secondary | ICD-10-CM | POA: Diagnosis not present

## 2022-09-27 DIAGNOSIS — R202 Paresthesia of skin: Secondary | ICD-10-CM | POA: Diagnosis not present

## 2022-10-02 ENCOUNTER — Other Ambulatory Visit: Payer: Self-pay | Admitting: Family Medicine

## 2022-10-10 DIAGNOSIS — H34811 Central retinal vein occlusion, right eye, with macular edema: Secondary | ICD-10-CM | POA: Diagnosis not present

## 2022-10-10 DIAGNOSIS — H35033 Hypertensive retinopathy, bilateral: Secondary | ICD-10-CM | POA: Diagnosis not present

## 2022-10-10 DIAGNOSIS — H3582 Retinal ischemia: Secondary | ICD-10-CM | POA: Diagnosis not present

## 2022-10-10 DIAGNOSIS — H35351 Cystoid macular degeneration, right eye: Secondary | ICD-10-CM | POA: Diagnosis not present

## 2022-10-12 ENCOUNTER — Encounter: Payer: Self-pay | Admitting: Family Medicine

## 2022-10-12 ENCOUNTER — Ambulatory Visit (INDEPENDENT_AMBULATORY_CARE_PROVIDER_SITE_OTHER): Payer: Medicare Other | Admitting: Family Medicine

## 2022-10-12 VITALS — BP 138/78 | HR 74 | Temp 98.7°F | Ht 63.0 in | Wt 139.2 lb

## 2022-10-12 DIAGNOSIS — S39012A Strain of muscle, fascia and tendon of lower back, initial encounter: Secondary | ICD-10-CM | POA: Diagnosis not present

## 2022-10-12 NOTE — Patient Instructions (Signed)
Nice to see you. Please try the salon pas. If that does not work you could try topical voltaren. If that does not help you could try capsaicin ointment.  If that does not work please contact me. If you develop numbness, weakness, bowel or bladder dysfunction, or numbness between your legs please seek medical attention again in person.

## 2022-10-12 NOTE — Progress Notes (Signed)
Tommi Rumps, MD Phone: [phone]  ABI SHOULTS is a 76 y.o. female who presents today for low back pain:  Patient notes onset of symptoms 3 to 4 days ago.  She was grabbing a can of food out of the fridge and felt her left low back grab.  She notes at rest the pain is 8/10.  When she moves around it is 10/10.  She notes no numbness, weakness, incontinence, saddle anesthesia, or radiation.  She has a history of degenerative disc disease in her neck and reports a remote history of this in her low back.  She has a history of a subdural hematoma while taking lots of Advil in the past.  She has a family history of malignant hyperthermia.  She has numerous intolerances to medications.  She has not been able to tolerate muscle relaxers in the past.  Social History   Tobacco Use  Smoking Status Never  Smokeless Tobacco Never    Current Outpatient Medications on File Prior to Visit  Medication Sig Dispense Refill   acetaminophen (TYLENOL) 500 MG tablet Take 1,000 mg by mouth every 6 (six) hours as needed (pain).     Carboxymethylcell-Hypromellose 0.25-0.3 % GEL Place 1 application into both eyes at bedtime.      Cholecalciferol (VITAMIN D3) 25 MCG (1000 UT) CAPS Take 2 capsules (2,000 Units total) by mouth daily.     diltiazem (CARTIA XT) 120 MG 24 hr capsule Take 1 capsule (120 mg total) by mouth daily. 90 capsule 3   famotidine (PEPCID) 20 MG tablet Take 1 tablet by mouth twice daily 180 tablet 0   hydrochlorothiazide (HYDRODIURIL) 12.5 MG tablet Take 1 tablet (12.5 mg total) by mouth every other day.     LORazepam (ATIVAN) 1 MG tablet TAKE 1 TABLET BY MOUTH EVERY 6 HOURS AS NEEDED 120 tablet 2   NON FORMULARY Place 1 drop into the right eye 2 (two) times daily. 0.5 Prednisolone PO4 1% /MOXI 0.5% /BROM 0.075%     Polyethyl Glycol-Propyl Glycol (SYSTANE OP) Place 1 drop into both eyes daily as needed (dry eyes).      No current facility-administered medications on file prior to visit.      ROS see history of present illness  Objective  Physical Exam Vitals:   10/12/22 1222  BP: 138/78  Pulse: 74  Temp: 98.7 F (37.1 C)  SpO2: 95%    BP Readings from Last 3 Encounters:  10/12/22 138/78  09/06/22 (!) 144/78  05/07/22 120/80   Wt Readings from Last 3 Encounters:  10/12/22 139 lb 3.2 oz (63.1 kg)  09/06/22 140 lb (63.5 kg)  05/07/22 133 lb (60.3 kg)    Physical Exam Musculoskeletal:     Comments: No midline spine tenderness, no midline spine step-off, no muscular back tenderness  Neurological:     Comments: 5/5 strength bilateral quads, hamstrings, plantarflexion, and dorsiflexion, sensation to light touch intact bilateral lower extremities, 2+ patellar reflexes, negative slump test      Assessment/Plan: Please see individual problem list.  Problem List Items Addressed This Visit     Low back strain - Primary    Suspect muscular spasm/strain.  Does not appear to have nerve impingement per history.  Her medication options for treatment are limited given her intolerances/allergies.  She will try Salonpas.  If that is not beneficial she can try Voltaren gel over-the-counter or capsaicin ointment over-the-counter.  If those things are not beneficial she will contact us.  Advised to seek medical  attention for numbness, weakness, incontinence, saddle anesthesia, or worsening pain.       Return if symptoms worsen or fail to improve.   Tommi Rumps, MD [place]

## 2022-10-12 NOTE — Assessment & Plan Note (Signed)
Suspect muscular spasm/strain.  Does not appear to have nerve impingement per history.  Her medication options for treatment are limited given her intolerances/allergies.  She will try Salonpas.  If that is not beneficial she can try Voltaren gel over-the-counter or capsaicin ointment over-the-counter.  If those things are not beneficial she will contact us.  Advised to seek medical attention for numbness, weakness, incontinence, saddle anesthesia, or worsening pain.

## 2022-10-31 ENCOUNTER — Ambulatory Visit (INDEPENDENT_AMBULATORY_CARE_PROVIDER_SITE_OTHER): Payer: Medicare Other

## 2022-10-31 DIAGNOSIS — G243 Spasmodic torticollis: Secondary | ICD-10-CM | POA: Diagnosis not present

## 2022-10-31 DIAGNOSIS — M47812 Spondylosis without myelopathy or radiculopathy, cervical region: Secondary | ICD-10-CM | POA: Diagnosis not present

## 2022-10-31 DIAGNOSIS — I495 Sick sinus syndrome: Secondary | ICD-10-CM

## 2022-10-31 LAB — CUP PACEART REMOTE DEVICE CHECK
Battery Impedance: 614 Ohm
Battery Remaining Longevity: 90 mo
Battery Voltage: 2.78 V
Brady Statistic AP VP Percent: 0 %
Brady Statistic AP VS Percent: 50 %
Brady Statistic AS VP Percent: 0 %
Brady Statistic AS VS Percent: 49 %
Date Time Interrogation Session: 20231129083555
Implantable Lead Connection Status: 753985
Implantable Lead Connection Status: 753985
Implantable Lead Implant Date: 20060620
Implantable Lead Implant Date: 20060620
Implantable Lead Location: 753859
Implantable Lead Location: 753860
Implantable Lead Model: 5076
Implantable Lead Model: 5076
Implantable Pulse Generator Implant Date: 20160219
Lead Channel Impedance Value: 715 Ohm
Lead Channel Impedance Value: 853 Ohm
Lead Channel Pacing Threshold Amplitude: 0.5 V
Lead Channel Pacing Threshold Amplitude: 0.625 V
Lead Channel Pacing Threshold Pulse Width: 0.4 ms
Lead Channel Pacing Threshold Pulse Width: 0.4 ms
Lead Channel Setting Pacing Amplitude: 2 V
Lead Channel Setting Pacing Amplitude: 2.5 V
Lead Channel Setting Pacing Pulse Width: 0.4 ms
Lead Channel Setting Sensing Sensitivity: 5.6 mV
Zone Setting Status: 755011
Zone Setting Status: 755011

## 2022-11-01 ENCOUNTER — Other Ambulatory Visit: Payer: Self-pay | Admitting: Family Medicine

## 2022-11-01 NOTE — Telephone Encounter (Signed)
Refill request for LORazepam 1 MG Oral Tablet   LOV - 10/12/22 Next OV - not scheduled Last refill - 08/01/22 #120/2

## 2022-11-13 DIAGNOSIS — M542 Cervicalgia: Secondary | ICD-10-CM | POA: Diagnosis not present

## 2022-11-22 DIAGNOSIS — M542 Cervicalgia: Secondary | ICD-10-CM | POA: Diagnosis not present

## 2022-11-28 NOTE — Progress Notes (Signed)
Remote pacemaker transmission.   

## 2022-12-12 DIAGNOSIS — H34811 Central retinal vein occlusion, right eye, with macular edema: Secondary | ICD-10-CM | POA: Diagnosis not present

## 2022-12-26 ENCOUNTER — Other Ambulatory Visit: Payer: Self-pay | Admitting: Family Medicine

## 2022-12-26 DIAGNOSIS — H3582 Retinal ischemia: Secondary | ICD-10-CM | POA: Diagnosis not present

## 2022-12-26 DIAGNOSIS — H5319 Other subjective visual disturbances: Secondary | ICD-10-CM | POA: Diagnosis not present

## 2022-12-26 DIAGNOSIS — H34811 Central retinal vein occlusion, right eye, with macular edema: Secondary | ICD-10-CM | POA: Diagnosis not present

## 2022-12-26 DIAGNOSIS — H35033 Hypertensive retinopathy, bilateral: Secondary | ICD-10-CM | POA: Diagnosis not present

## 2023-01-04 ENCOUNTER — Other Ambulatory Visit (INDEPENDENT_AMBULATORY_CARE_PROVIDER_SITE_OTHER): Payer: Medicare Other

## 2023-01-04 DIAGNOSIS — E559 Vitamin D deficiency, unspecified: Secondary | ICD-10-CM | POA: Diagnosis not present

## 2023-01-04 NOTE — Addendum Note (Signed)
Addended by: Ellamae Sia on: 01/04/2023 02:59 PM   Modules accepted: Orders

## 2023-01-05 LAB — VITAMIN D 25 HYDROXY (VIT D DEFICIENCY, FRACTURES): Vit D, 25-Hydroxy: 23 ng/mL — ABNORMAL LOW (ref 30–100)

## 2023-01-07 ENCOUNTER — Other Ambulatory Visit: Payer: Self-pay | Admitting: Family Medicine

## 2023-01-07 DIAGNOSIS — E559 Vitamin D deficiency, unspecified: Secondary | ICD-10-CM

## 2023-01-07 MED ORDER — VITAMIN D3 25 MCG (1000 UT) PO CAPS: 3000.0000 [IU] | ORAL_CAPSULE | Freq: Every day | ORAL | Status: DC

## 2023-01-11 DIAGNOSIS — Z961 Presence of intraocular lens: Secondary | ICD-10-CM | POA: Diagnosis not present

## 2023-01-11 DIAGNOSIS — H35351 Cystoid macular degeneration, right eye: Secondary | ICD-10-CM | POA: Diagnosis not present

## 2023-01-14 ENCOUNTER — Encounter: Payer: Self-pay | Admitting: Cardiovascular Disease

## 2023-01-14 ENCOUNTER — Ambulatory Visit: Payer: Medicare Other | Attending: Cardiovascular Disease | Admitting: Cardiovascular Disease

## 2023-01-14 VITALS — BP 140/86 | HR 71 | Ht 64.0 in | Wt 141.0 lb

## 2023-01-14 DIAGNOSIS — I491 Atrial premature depolarization: Secondary | ICD-10-CM | POA: Diagnosis not present

## 2023-01-14 DIAGNOSIS — I10 Essential (primary) hypertension: Secondary | ICD-10-CM | POA: Diagnosis not present

## 2023-01-14 DIAGNOSIS — I495 Sick sinus syndrome: Secondary | ICD-10-CM

## 2023-01-14 NOTE — Progress Notes (Signed)
Chief Complaint  Patient presents with   Follow-up    Sick sinus syndrome   History of Present Illness: 77 yo female with history of sleep apnea, HLD, HTN, GERD/hiatal hernia/esopagheal stricture, sick sinus syndrome s/p pacemaker, PACs, PVCs who is here today for cardiac follow up. She had been followed by Dr. Mare Ferrari for general cardiology issues. Her pacemaker was changed in 2016 due to end of battery life. Echo June 2017 with normal LV function, no valve disease. She is known to have PACs and PVCs.  She had a spontaneous subdural hematoma in October 2018 that did not require surgical intervention. Echo May 2021 with LVEF=60-65%. No valve disease.   She is here today for follow up. The patient denies any chest pain, dyspnea, palpitations, lower extremity edema, orthopnea, PND, dizziness, near syncope or syncope. She has been limited by chronic neck pain.   Primary Care Physician: Tonia Ghent, MD  Past Medical History:  Diagnosis Date   Adenomatous colon polyp 12/2006   Allergic rhinitis    Anemia    Anxiety    Appendicitis 04/2020   Arthritis    Bradycardia    s/p MDT PPM   Cataract    bilateral   Depression    Esophageal stricture    Family history of adverse reaction to anesthesia    maternal uncle with MH, nephew had MH during surgery and was OK, pt not been tested   GERD (gastroesophageal reflux disease)    Hemorrhoids    Hyperlipidemia    Hypertension    Inguinal hernia    Osteoporosis    Presence of permanent cardiac pacemaker 2006   SSS   PVC (premature ventricular contraction)    Scleritis of right eye    Skin cancer    Basal and squamous cell   Sleep apnea    a. intolerant of CPAP, does wear a mouthpiece   Subdural hematoma ([place])    a. spontanous 09/2017 b. resolved without intervention   Thyroid disease     Past Surgical History:  Procedure Laterality Date   ABDOMINAL HYSTERECTOMY     APPENDECTOMY     BREAST BIOPSY Left 01/15/2014   BREAST  SURGERY     Biopsy-benign   CESAREAN SECTION     COLONOSCOPY  2018   DILATION AND CURETTAGE OF UTERUS     ESOPHAGOGASTRODUODENOSCOPY     esophageal stretching   HAND SURGERY  1992   HERNIA REPAIR     INGUINAL HERNIA REPAIR  02/12/1994   left inguinal hernia repair   KNEE ARTHROSCOPY  2011   LAPAROSCOPIC APPENDECTOMY N/A 11/29/2020   Procedure: APPENDECTOMY LAPAROSCOPIC;  Surgeon: Jesusita Oka, MD;  Location: [facility];  Service: General;  Laterality: N/A;   OOPHORECTOMY  1995   LSO and RSO   PACEMAKER GENERATOR CHANGE N/A 01/21/2015   Procedure: PACEMAKER GENERATOR CHANGE;  Surgeon: [name], MD;  Location: [facility] CATH LAB;  Service: Cardiovascular;  Laterality: N/A;   PACEMAKER INSERTION  2006   Dr. Verlon Setting   POLYPECTOMY     Tear duct surgery  2010   [place]   RIGHT SIDE   TOTAL KNEE ARTHROPLASTY Right 12/20/2014   Procedure: RIGHT TOTAL KNEE ARTHROPLASTY;  Surgeon: Vickey Huger, MD;  Location: [facility];  Service: Orthopedics;  Laterality: Right;   UPPER GASTROINTESTINAL ENDOSCOPY     unsure    Current Outpatient Medications  Medication Sig Dispense Refill   acetaminophen (  TYLENOL) 500 MG tablet Take 1,000 mg by mouth every 6 (six) hours as needed (pain).     Carboxymethylcell-Hypromellose 0.25-0.3 % GEL Place 1 application into both eyes at bedtime.      Cholecalciferol (VITAMIN D3) 25 MCG (1000 UT) CAPS Take 3 capsules (3,000 Units total) by mouth daily.     diltiazem (CARTIA XT) 120 MG 24 hr capsule Take 1 capsule (120 mg total) by mouth daily. 90 capsule 3   famotidine (PEPCID) 20 MG tablet Take 1 tablet by mouth twice daily 180 tablet 0   hydrochlorothiazide (HYDRODIURIL) 12.5 MG tablet Take 1 tablet (12.5 mg total) by mouth every other day.     LORazepam (ATIVAN) 1 MG tablet TAKE 1 TABLET BY MOUTH EVERY 6 HOURS AS NEEDED 120 tablet 2   NON FORMULARY Place 1 drop into the right eye 2 (two) times daily. 0.5 Prednisolone PO4 1%  /MOXI 0.5% /BROM 0.075%     Polyethyl Glycol-Propyl Glycol (SYSTANE OP) Place 1 drop into both eyes daily as needed (dry eyes).      No current facility-administered medications for this visit.    Allergies  Allergen Reactions   Hydrocodone     Increased HR   Antihistamines, Loratadine-Type Other (See Comments)    Heart races/jittery   Bisoprolol-Hydrochlorothiazide Other (See Comments)    Couldn't tolerate UNKNOWN REACTION   Cephalexin Nausea And Vomiting   Codeine Other (See Comments)    Increased heart rate, anxiety   Demerol [Meperidine] Other (See Comments)    Increased heart rate, anxiety   Epinephrine Other (See Comments)    Increased heart rate   Erythromycin Nausea And Vomiting   Hyoscyamine Sulfate Other (See Comments)    Pt could not tolerate this med UNKNOWN reaction   Inderal [Propranolol] Other (See Comments)    Extreme tiredness   Lidocaine Other (See Comments)    Family hx of malignant hyperthermia   Lipitor [Atorvastatin] Other (See Comments)    Muscle aches   Losartan Potassium Other (See Comments)    Pt could not tolerate this med UNKNOWN REACTION   Morphine Other (See Comments)    Nightmares, increased BP   Nebivolol Other (See Comments)    angioedema   Paxil [Paroxetine Hcl] Other (See Comments)    "drove her up the wall"   Penicillins Nausea And Vomiting   Prednisone Other (See Comments)    Tolerates low doses if absolutely necessary - makes her nervous, jittery   Propranolol Hcl Other (See Comments)    Pt could not tolerate this med UNKNOWN REACTION   Sertraline Hcl Other (See Comments)    Pt could not tolerate this med UNKNOWN REACTION   Tetracycline Nausea And Vomiting   Toprol Xl [Metoprolol Succinate] Other (See Comments)    Extreme tiredness   Verapamil Other (See Comments)    Pt could not tolerate this med UNKNOWN REACTION   Hydralazine     headaches   Oxycodone-Acetaminophen Palpitations    Causes accelerated heart rate     Social History   Socioeconomic History   Marital status: Married    Spouse name: Psychologist, prison and probation services   Number of children: 1   Years of education: Not on file   Highest education level: Not on file  Occupational History   Occupation: Retired    Fish farm manager: UNEMPLOYED  Tobacco Use   Smoking status: Never   Smokeless tobacco: Never  Vaping Use   Vaping Use: Never used  Substance and Sexual Activity   Alcohol use: No  Alcohol/week: 0.0 standard drinks of alcohol   Drug use: No   Sexual activity: Not Currently    Birth control/protection: Surgical, Post-menopausal    Comment: HYST-1st intercourse 77 yo-fewer than 5 partners  Other Topics Concern   Not on file  Social History Narrative   Education:  12th grade   Married 1971   1 son in [place], 4 grandkids   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   Social Determinants of [place] Strain: [place]  (11/08/2021)   Overall Financial Resource Strain (CARDIA)    Difficulty of Paying Living Expenses: Not hard at all  Food Insecurity: No Food Insecurity (07/15/2020)   Hunger Vital Sign    Worried About Running Out of Food in the Last Year: Never true    [place] in the Last Year: Never true  Transportation Needs: No Transportation Needs (07/15/2020)   PRAPARE - Hydrologist (Medical): No    Lack of Transportation (Non-Medical): No  Physical Activity: Inactive (07/15/2020)   Exercise Vital Sign    Days of Exercise per Week: 0 days    Minutes of Exercise per Session: 0 min  Stress: Stress Concern Present (07/15/2020)   [place]    Feeling of Stress : To some extent  Social Connections: Not on file  Intimate Partner Violence: Not At Risk (07/15/2020)   Humiliation, Afraid, Rape, and Kick questionnaire    Fear of Current or Ex-Partner: No    Emotionally Abused: No    Physically Abused: No    Sexually Abused: No    Family  History  Problem Relation Age of Onset   Heart failure Sister    Heart disease Sister    Hypertension Sister    Colon polyps Sister    Dementia Mother    Hypertension Mother    Colon polyps Mother    Hypertension Father    Arthritis Father    Breast cancer Maternal Aunt        Age 59's   Diabetes Maternal Aunt    Diabetes Maternal Uncle    Ovarian cancer Maternal Aunt    Colon cancer Neg Hx    Esophageal cancer Neg Hx    Stomach cancer Neg Hx    Rectal cancer Neg Hx     Review of Systems:  As stated in the HPI and otherwise negative.   BP (!) 140/86   Pulse 71   Ht 5' 4"$  (1.626 m)   Wt 64 kg   SpO2 95%   BMI 24.20 kg/m   Physical Examination: General: Well developed, well nourished, NAD  HEENT: OP clear, mucus membranes moist  SKIN: warm, dry. No rashes. Neuro: No focal deficits  Musculoskeletal: Muscle strength 5/5 all ext  Psychiatric: Mood and affect normal  Neck: No JVD, no carotid bruits, no thyromegaly, no lymphadenopathy.  Lungs:Clear bilaterally, no wheezes, rhonci, crackles Cardiovascular: Regular rate and rhythm. No murmurs, gallops or rubs. Abdomen:Soft. Bowel sounds present. Non-tender.  Extremities: No lower extremity edema. Pulses are 2 + in the bilateral DP/PT.  Echo May 2021:  1. Left ventricular ejection fraction, by estimation, is 60 to 65%. The  left ventricle has normal function. The left ventricle has no regional  wall motion abnormalities. Left ventricular diastolic parameters were  normal.   2. Right ventricular systolic function is normal. The right ventricular  size is normal. There is normal pulmonary artery systolic pressure.  3. The mitral valve is normal in structure. No evidence of mitral valve  regurgitation. No evidence of mitral stenosis.   4. The aortic valve is normal in structure. Aortic valve regurgitation is  not visualized. No aortic stenosis is present.   5. The inferior vena cava is normal in size with greater than 50%   respiratory variability, suggesting right atrial pressure of 3 mmHg.   EKG:  EKG is ordered today. The ekg ordered today demonstrates Atrial paced. LAFB  Recent Labs: 05/07/2022: ALT 13; BUN 16; Creatinine, Ser 0.80; Hemoglobin 13.5; Platelets 238.0; Potassium 4.0; Sodium 133; TSH 1.90   Lipid Panel    Component Value Date/Time   CHOL 237 (H) 08/30/2022 0938   TRIG 109.0 08/30/2022 0938   HDL 66.50 08/30/2022 0938   CHOLHDL 4 08/30/2022 0938   VLDL 21.8 08/30/2022 0938   LDLCALC 148 (H) 08/30/2022 0938   LDLDIRECT 146.6 01/25/2014 0953     Wt Readings from Last 3 Encounters:  01/14/23 64 kg  10/12/22 63.1 kg  09/06/22 63.5 kg    Assessment and Plan:   1. HTN: BP is well controlled today. No changes. Continue HCTZ every other day and Cardizem daily.   2. HLD: Followed in primary care. She has not tolerated Lipitor in the past. We discussed Repatha and she will consider. She is now known to have CAD. We also discussed a coronary calcium score to further define the importance of adding therapy to lower her lipids. (LDL 148 in sept 2023). She will consider all of this and call back if she decides to proceed.   3. Symptomatic bradycardia: Her pacemaker is followed in the pacemaker clinic.  Will contact the EP team to plan follow up.   4. Palpitations/PVCs/PACs: No palpitations. Continue Cardizem.   Labs/ tests ordered today include:   Orders Placed This Encounter  Procedures   EKG 12-Lead   Disposition:   F/U with me in 12  months  Signed, Lauree Chandler, MD 01/14/2023 4:27 PM    [facility] [address], [place], [place]  [address] Phone: [phone]; Fax: [phone]

## 2023-01-14 NOTE — Patient Instructions (Signed)
Medication Instructions:  No changes *If you need a refill on your cardiac medications before your next appointment, please call your pharmacy*   Lab Work: none If you have labs (blood work) drawn today and your tests are completely normal, you will receive your results only by: [place] (if you have MyChart) OR A paper copy in the mail If you have any lab test that is abnormal or we need to change your treatment, we will call you to review the results.   Testing/Procedures: none   Follow-Up: At [facility], you and your health needs are our priority.  As part of our continuing mission to provide you with exceptional heart care, we have created designated Provider Care Teams.  These Care Teams include your primary Cardiologist (physician) and Advanced Practice Providers (APPs -  Physician Assistants and Nurse Practitioners) who all work together to provide you with the care you need, when you need it.   Your next appointment:   12 month(s)  Provider:   Lauree Chandler, MD     Other Instructions Please make an appointment today at check out with Tommye Standard, PA-C.  The appointment was due in December 2023.

## 2023-01-28 DIAGNOSIS — Z961 Presence of intraocular lens: Secondary | ICD-10-CM | POA: Diagnosis not present

## 2023-01-28 DIAGNOSIS — H35033 Hypertensive retinopathy, bilateral: Secondary | ICD-10-CM | POA: Diagnosis not present

## 2023-01-28 DIAGNOSIS — H3582 Retinal ischemia: Secondary | ICD-10-CM | POA: Diagnosis not present

## 2023-01-28 DIAGNOSIS — H25812 Combined forms of age-related cataract, left eye: Secondary | ICD-10-CM | POA: Diagnosis not present

## 2023-01-28 DIAGNOSIS — H34811 Central retinal vein occlusion, right eye, with macular edema: Secondary | ICD-10-CM | POA: Diagnosis not present

## 2023-01-30 ENCOUNTER — Encounter: Payer: Self-pay | Admitting: Physician Assistant

## 2023-01-30 ENCOUNTER — Ambulatory Visit: Payer: Medicare Other | Attending: Physician Assistant | Admitting: Physician Assistant

## 2023-01-30 ENCOUNTER — Ambulatory Visit: Payer: Medicare Other

## 2023-01-30 VITALS — BP 132/80 | HR 78 | Ht 64.0 in | Wt 141.4 lb

## 2023-01-30 DIAGNOSIS — I4729 Other ventricular tachycardia: Secondary | ICD-10-CM | POA: Diagnosis not present

## 2023-01-30 DIAGNOSIS — I10 Essential (primary) hypertension: Secondary | ICD-10-CM

## 2023-01-30 DIAGNOSIS — I493 Ventricular premature depolarization: Secondary | ICD-10-CM

## 2023-01-30 DIAGNOSIS — Z950 Presence of cardiac pacemaker: Secondary | ICD-10-CM | POA: Diagnosis not present

## 2023-01-30 DIAGNOSIS — R002 Palpitations: Secondary | ICD-10-CM

## 2023-01-30 LAB — CUP PACEART INCLINIC DEVICE CHECK
Battery Impedance: 689 Ohm
Battery Remaining Longevity: 85 mo
Battery Voltage: 2.78 V
Brady Statistic AP VP Percent: 0 %
Brady Statistic AP VS Percent: 50 %
Brady Statistic AS VP Percent: 0 %
Brady Statistic AS VS Percent: 50 %
Date Time Interrogation Session: 20240228122235
Implantable Lead Connection Status: 753985
Implantable Lead Connection Status: 753985
Implantable Lead Implant Date: 20060620
Implantable Lead Implant Date: 20060620
Implantable Lead Location: 753859
Implantable Lead Location: 753860
Implantable Lead Model: 5076
Implantable Lead Model: 5076
Implantable Pulse Generator Implant Date: 20160219
Lead Channel Impedance Value: 751 Ohm
Lead Channel Impedance Value: 873 Ohm
Lead Channel Pacing Threshold Amplitude: 0.25 V
Lead Channel Pacing Threshold Amplitude: 0.625 V
Lead Channel Pacing Threshold Amplitude: 0.75 V
Lead Channel Pacing Threshold Amplitude: 0.75 V
Lead Channel Pacing Threshold Pulse Width: 0.06 ms
Lead Channel Pacing Threshold Pulse Width: 0.21 ms
Lead Channel Pacing Threshold Pulse Width: 0.4 ms
Lead Channel Pacing Threshold Pulse Width: 0.4 ms
Lead Channel Sensing Intrinsic Amplitude: 2 mV
Lead Channel Sensing Intrinsic Amplitude: 22.4 mV
Lead Channel Setting Pacing Amplitude: 2 V
Lead Channel Setting Pacing Amplitude: 2.5 V
Lead Channel Setting Pacing Pulse Width: 0.4 ms
Lead Channel Setting Sensing Sensitivity: 5.6 mV
Zone Setting Status: 755011
Zone Setting Status: 755011

## 2023-01-30 NOTE — Patient Instructions (Signed)
Medication Instructions:   Your physician recommends that you continue on your current medications as directed. Please refer to the Current Medication list given to you today.   *If you need a refill on your cardiac medications before your next appointment, please call your pharmacy*   Lab Work: [place]   If you have labs (blood work) drawn today and your tests are completely normal, you will receive your results only by: [place] (if you have MyChart) OR A paper copy in the mail If you have any lab test that is abnormal or we need to change your treatment, we will call you to review the results.   Testing/Procedures: NONE ORDERED  TODAY    Follow-Up: At [facility], you and your health needs are our priority.  As part of our continuing mission to provide you with exceptional heart care, we have created designated Provider Care Teams.  These Care Teams include your primary Cardiologist (physician) and Advanced Practice Providers (APPs -  Physician Assistants and Nurse Practitioners) who all work together to provide you with the care you need, when you need it.  We recommend signing up for the patient portal called "MyChart".  Sign up information is provided on this After Visit Summary.  MyChart is used to connect with patients for Virtual Visits (Telemedicine).  Patients are able to view lab/test results, encounter notes, upcoming appointments, etc.  Non-urgent messages can be sent to your provider as well.   To learn more about what you can do with MyChart, go to [email or web address].    Your next appointment:   1 year(s)  Provider:   Doralee Albino, MD    Other Instructions

## 2023-01-30 NOTE — Progress Notes (Signed)
Cardiology Office Note Date:  01/30/2023  Patient ID:  [name], [name] May 21, 1946, MRN [id number] PCP:  Tonia Ghent, MD  Cardiologist:  Dr. Angelena Form Electrophysiologist: Dr. Rayann Heman    Chief Complaint:  1 year  History of Present Illness: [name] is a 77 y.o. female with history of HTN, NSVT, PACs/PVCs, ATach, symptomatic bradycardia w.PPM, sleep apnea, GERD/hiatal hernia.  She has hx of spontaneous subdural hematoma 2018  She saw Dr. Rayann Heman Dec 2022, doing well, discussed fiondings of NSVT and some nonsustained AT, not felt to need any therapy at that time.  She has seen Dr. Angelena Form a couple times since then, last was 01/14/23, doing wwell, discussed lipid management and CAD screening, she was going to give it some thought.  TODAY She is accompanied by her husband. She continues to feel well. All her life she has had waxing/waning palpitations, they are brief/fleeting flutters, no associated symptoms or trigger. Not changing in behavior, frequency/duration over the years  She had a contrast study Monday for her macular degeneration and with the injection of the dye she got a rising sensation in her stomach to her chest, last seconds. No CP otherwise. No SOB  She has gotten sedentary over the years   Device information MDT  dual chamber PPM implanted 05/22/2005, gen change 01/21/2014   Past Medical History:  Diagnosis Date   Adenomatous colon polyp 12/2006   Allergic rhinitis    Anemia    Anxiety    Appendicitis 04/2020   Arthritis    Bradycardia    s/p MDT PPM   Cataract    bilateral   Depression    Esophageal stricture    Family history of adverse reaction to anesthesia    maternal uncle with MH, nephew had MH during surgery and was OK, pt not been tested   GERD (gastroesophageal reflux disease)    Hemorrhoids    Hyperlipidemia    Hypertension    Inguinal hernia    Osteoporosis    Presence of permanent cardiac pacemaker 2006   SSS   PVC  (premature ventricular contraction)    Scleritis of right eye    Skin cancer    Basal and squamous cell   Sleep apnea    a. intolerant of CPAP, does wear a mouthpiece   Subdural hematoma ([place])    a. spontanous 09/2017 b. resolved without intervention   Thyroid disease     Past Surgical History:  Procedure Laterality Date   ABDOMINAL HYSTERECTOMY     APPENDECTOMY     BREAST BIOPSY Left 01/15/2014   BREAST SURGERY     Biopsy-benign   CESAREAN SECTION     COLONOSCOPY  2018   DILATION AND CURETTAGE OF UTERUS     ESOPHAGOGASTRODUODENOSCOPY     esophageal stretching   HAND SURGERY  1992   HERNIA REPAIR     INGUINAL HERNIA REPAIR  02/12/1994   left inguinal hernia repair   KNEE ARTHROSCOPY  2011   LAPAROSCOPIC APPENDECTOMY N/A 11/29/2020   Procedure: APPENDECTOMY LAPAROSCOPIC;  Surgeon: Jesusita Oka, MD;  Location: [facility];  Service: General;  Laterality: N/A;   OOPHORECTOMY  1995   LSO and RSO   PACEMAKER GENERATOR CHANGE N/A 01/21/2015   Procedure: PACEMAKER GENERATOR CHANGE;  Surgeon: [name], MD;  Location: [facility] CATH LAB;  Service: Cardiovascular;  Laterality: N/A;   PACEMAKER INSERTION  2006   Dr. Verlon Setting   POLYPECTOMY     Tear  duct surgery  2010   THUMB SURGERY     THYROIDECTOMY  1979   RIGHT SIDE   TOTAL KNEE ARTHROPLASTY Right 12/20/2014   Procedure: RIGHT TOTAL KNEE ARTHROPLASTY;  Surgeon: Vickey Huger, MD;  Location: [facility];  Service: Orthopedics;  Laterality: Right;   UPPER GASTROINTESTINAL ENDOSCOPY     unsure    Current Outpatient Medications  Medication Sig Dispense Refill   acetaminophen (TYLENOL) 500 MG tablet Take 1,000 mg by mouth every 6 (six) hours as needed (pain).     Carboxymethylcell-Hypromellose 0.25-0.3 % GEL Place 1 application into both eyes at bedtime.      Cholecalciferol (VITAMIN D3) 25 MCG (1000 UT) CAPS Take 3 capsules (3,000 Units total) by mouth daily.     diltiazem (CARTIA XT) 120 MG 24 hr capsule Take 1 capsule  (120 mg total) by mouth daily. 90 capsule 3   famotidine (PEPCID) 20 MG tablet Take 1 tablet by mouth twice daily 180 tablet 0   hydrochlorothiazide (HYDRODIURIL) 12.5 MG tablet Take 1 tablet (12.5 mg total) by mouth every other day.     LORazepam (ATIVAN) 1 MG tablet TAKE 1 TABLET BY MOUTH EVERY 6 HOURS AS NEEDED 120 tablet 2   NON FORMULARY Place 1 drop into the right eye 2 (two) times daily. 0.5 Prednisolone PO4 1% /MOXI 0.5% /BROM 0.075%     Polyethyl Glycol-Propyl Glycol (SYSTANE OP) Place 1 drop into both eyes daily as needed (dry eyes).      No current facility-administered medications for this visit.    Allergies:   Hydrocodone; Antihistamines, loratadine-type; Bisoprolol-hydrochlorothiazide; Cephalexin; Codeine; Demerol [meperidine]; Epinephrine; Erythromycin; Hyoscyamine sulfate; Inderal [propranolol]; Lidocaine; Lipitor [atorvastatin]; Losartan potassium; Morphine; Nebivolol; Paxil [paroxetine hcl]; Penicillins; Prednisone; Propranolol hcl; Sertraline hcl; Tetracycline; Toprol xl [metoprolol succinate]; Verapamil; Hydralazine; and Oxycodone-acetaminophen   Social History:  The patient  reports that she has never smoked. She has never used smokeless tobacco. She reports that she does not drink alcohol and does not use drugs.   Family History:  The patient's family history includes Arthritis in her father; Breast cancer in her maternal aunt; Colon polyps in her mother and sister; Dementia in her mother; Diabetes in her maternal aunt and maternal uncle; Heart disease in her sister; Heart failure in her sister; Hypertension in her father, mother, and sister; Ovarian cancer in her maternal aunt.  ROS:  Please see the history of present illness.    All other systems are reviewed and otherwise negative.   PHYSICAL EXAM:  VS:  BP 132/80   Pulse 78   Ht '5\' 4"'$  (1.626 m)   Wt 141 lb 6.4 oz (64.1 kg)   SpO2 95%   BMI 24.27 kg/m  BMI: Body mass index is 24.27 kg/m. Well nourished, well  developed, in no acute distress HEENT: normocephalic, atraumatic Neck: no JVD, carotid bruits or masses Cardiac:   RRR; no significant murmurs, no rubs, or gallops Lungs:  CTA b/l, no wheezing, rhonchi or rales Abd: soft, nontender MS: no deformity or atrophy Ext: no edema Skin: warm and dry, no rash Neuro:  No gross deficits appreciated Psych: euthymic mood, full affect  PPM site is stable, no tethering or discomfort   EKG:  not done today  Device interrogation done today and reviewed by myself:  Battery and lead measurements are good No AHR or VHR episodes noted + PVCs, without significant increase (low burden)   04/17/2020: TTE 1. Left ventricular ejection fraction, by estimation, is 60 to 65%. The  left  ventricle has normal function. The left ventricle has no regional  wall motion abnormalities. Left ventricular diastolic parameters were  normal.   2. Right ventricular systolic function is normal. The right ventricular  size is normal. There is normal pulmonary artery systolic pressure.   3. The mitral valve is normal in structure. No evidence of mitral valve  regurgitation. No evidence of mitral stenosis.   4. The aortic valve is normal in structure. Aortic valve regurgitation is  not visualized. No aortic stenosis is present.   5. The inferior vena cava is normal in size with greater than 50%  respiratory variability, suggesting right atrial pressure of 3 mmHg.    Recent Labs: 05/07/2022: ALT 13; BUN 16; Creatinine, Ser 0.80; Hemoglobin 13.5; Platelets 238.0; Potassium 4.0; Sodium 133; TSH 1.90  08/30/2022: Cholesterol 237; HDL 66.50; LDL Cholesterol 148; Total CHOL/HDL Ratio 4; Triglycerides 109.0; VLDL 21.8   CrCl cannot be calculated (Patient's most recent lab result is older than the maximum 21 days allowed.).   Wt Readings from Last 3 Encounters:  01/30/23 141 lb 6.4 oz (64.1 kg)  01/14/23 141 lb (64 kg)  10/12/22 139 lb 3.2 oz (63.1 kg)     Other studies  reviewed: Additional studies/records reviewed today include: summarized above  ASSESSMENT AND PLAN:  PPM Intact function No programming changes made  HTN Looks ok  NSVT ATach PACs/PVCs No change in burden of her palpitations Dates back to her 20's No atrial or ventricular high rate episodes Low PVC burden  Discussed transition to a new EP MD< plan for her to see Dr. Myles Gip   Disposition: F/u with remotes as usual, in clinic in a year, sooner if needed  Current medicines are reviewed at length with the patient today.  The patient did not have any concerns regarding medicines.  Venetia Night, PA-C 01/30/2023 11:30 AM     [facility] [place] [address] [phone] (office)  [phone] (fax)

## 2023-02-02 ENCOUNTER — Other Ambulatory Visit: Payer: Self-pay | Admitting: Nurse Practitioner

## 2023-02-02 MED ORDER — MOLNUPIRAVIR EUA 200MG CAPSULE: 4.0000 | ORAL_CAPSULE | Freq: Two times a day (BID) | ORAL | 0 refills | Status: AC

## 2023-02-02 MED ORDER — MOLNUPIRAVIR EUA 200MG CAPSULE: 4.0000 | ORAL_CAPSULE | Freq: Two times a day (BID) | ORAL | 0 refills | Status: DC

## 2023-02-02 NOTE — Progress Notes (Signed)
Husband tested positive for covid  Meds ordered this encounter  Medications   DISCONTD: molnupiravir EUA (LAGEVRIO) 200 mg CAPS capsule    Sig: Take 4 capsules (800 mg total) by mouth 2 (two) times daily for 5 days.    Dispense:  40 capsule    Refill:  0    Order Specific Question:   Supervising Provider    Answer:   Chase Picket [id number]   molnupiravir EUA (LAGEVRIO) 200 mg CAPS capsule    Sig: Take 4 capsules (800 mg total) by mouth 2 (two) times daily for 5 days.    Dispense:  40 capsule    Refill:  0    Order Specific Question:   Supervising Provider    Answer:   Chase Picket [id number]   [place], FNP

## 2023-02-04 ENCOUNTER — Telehealth: Payer: Self-pay

## 2023-02-04 MED ORDER — TIZANIDINE HCL 2 MG PO CAPS: 2.0000 mg | ORAL_CAPSULE | Freq: Three times a day (TID) | ORAL | 0 refills | Status: DC | PRN

## 2023-02-04 MED ORDER — BENZONATATE 200 MG PO CAPS: 200.0000 mg | ORAL_CAPSULE | Freq: Three times a day (TID) | ORAL | 1 refills | Status: DC | PRN

## 2023-02-04 NOTE — Telephone Encounter (Signed)
Called to check in on patients husband with having covid. While speaking with him he stated that his wife also has covid now and is doing worse then he is. She was given the molnupiravir rx as well; she test positive on Sunday. She has had cough, sneezing, nausea off and on and severe rib pain. She thinks she may have pulled something or something while coughing and sneezing so much. She has taken tylenol but that hasn't helped much. For her cough she has been using honey lemon cough drops but they do not help. Patient has also been applying a heating pad to area but that only helps until she sneezes again. They are wanting to know if theres something else she can take for cough and for the rib pain?

## 2023-02-04 NOTE — Telephone Encounter (Signed)
I hope she feels better soon.  Med options are limited given her h/o med intolerances.  Would try tessalon for cough, could try tizanidine for muscle spasms.  Sent both to [facility].  Try to brace with a pillow during coughing or sneezing.  Thanks.

## 2023-02-04 NOTE — Telephone Encounter (Signed)
Patient notified as instructed by telephone and verbalized understanding. Patient was given ER precautions and she verbalized understanding.

## 2023-02-05 ENCOUNTER — Other Ambulatory Visit: Payer: Self-pay | Admitting: Family Medicine

## 2023-02-06 NOTE — Telephone Encounter (Signed)
Refill request for  LORazepam 1 MG Oral Tablet   LOV - 09/06/22 Next OV - not scheduled Last refill - 11/02/22 #120/2

## 2023-02-21 ENCOUNTER — Ambulatory Visit (INDEPENDENT_AMBULATORY_CARE_PROVIDER_SITE_OTHER): Payer: Medicare Other | Admitting: Family Medicine

## 2023-02-21 ENCOUNTER — Encounter: Payer: Self-pay | Admitting: Family Medicine

## 2023-02-21 VITALS — BP 124/80 | HR 70 | Temp 98.1°F | Ht 64.0 in | Wt 142.0 lb

## 2023-02-21 DIAGNOSIS — E559 Vitamin D deficiency, unspecified: Secondary | ICD-10-CM | POA: Diagnosis not present

## 2023-02-21 DIAGNOSIS — R5383 Other fatigue: Secondary | ICD-10-CM

## 2023-02-21 NOTE — Patient Instructions (Signed)
Go to the lab on the way out.   If you have mychart we'll likely use that to update you.    Take care.  Glad to see you. 

## 2023-02-21 NOTE — Progress Notes (Signed)
She had covid, tested positive 02/03/23.  Cough, generally felt unwell initially.  Had R sided abd/rib pain, possibly from cough.  Cough is better now.  Soreness is better now.  No sputum now.  No fevers.  Tired, more than normal.  She gets frustrated since some days she'll get up feeling better.  Then 1-2 hours later she is more fatigued.  No CP.  Not SOB.  No BLE edema.  Not SOB supine.    PT helped her neck pain.    Meds, vitals, and allergies reviewed.   ROS: Per HPI unless specifically indicated in ROS section   GEN: nad, alert and oriented HEENT: ncat NECK: supple w/o LA CV: rrr.  PULM: ctab, no inc wob ABD: soft, +bs EXT: no edema SKIN: Skin well-perfused.

## 2023-02-22 ENCOUNTER — Other Ambulatory Visit: Payer: Self-pay | Admitting: Family Medicine

## 2023-02-22 ENCOUNTER — Encounter: Payer: Self-pay | Admitting: Family Medicine

## 2023-02-22 DIAGNOSIS — E559 Vitamin D deficiency, unspecified: Secondary | ICD-10-CM

## 2023-02-22 LAB — CBC WITH DIFFERENTIAL/PLATELET
Basophils Absolute: 0 10*3/uL (ref 0.0–0.1)
Basophils Relative: 1 % (ref 0.0–3.0)
Eosinophils Absolute: 0 10*3/uL (ref 0.0–0.7)
Eosinophils Relative: 1 % (ref 0.0–5.0)
HCT: 38.5 % (ref 36.0–46.0)
Hemoglobin: 13.3 g/dL (ref 12.0–15.0)
Lymphocytes Relative: 26.5 % (ref 12.0–46.0)
Lymphs Abs: 1.3 10*3/uL (ref 0.7–4.0)
MCHC: 34.5 g/dL (ref 30.0–36.0)
MCV: 94.3 fl (ref 78.0–100.0)
Monocytes Absolute: 0.5 10*3/uL (ref 0.1–1.0)
Monocytes Relative: 10.8 % (ref 3.0–12.0)
Neutro Abs: 2.9 10*3/uL (ref 1.4–7.7)
Neutrophils Relative %: 60.7 % (ref 43.0–77.0)
Platelets: 286 10*3/uL (ref 150.0–400.0)
RBC: 4.08 Mil/uL (ref 3.87–5.11)
RDW: 12.7 % (ref 11.5–15.5)
WBC: 4.8 10*3/uL (ref 4.0–10.5)

## 2023-02-22 LAB — COMPREHENSIVE METABOLIC PANEL
ALT: 12 U/L (ref 0–35)
AST: 16 U/L (ref 0–37)
Albumin: 4.3 g/dL (ref 3.5–5.2)
Alkaline Phosphatase: 53 U/L (ref 39–117)
BUN: 18 mg/dL (ref 6–23)
CO2: 29 mEq/L (ref 19–32)
Calcium: 9 mg/dL (ref 8.4–10.5)
Chloride: 94 mEq/L — ABNORMAL LOW (ref 96–112)
Creatinine, Ser: 0.85 mg/dL (ref 0.40–1.20)
GFR: 67.1 mL/min (ref >60.00)
Glucose, Bld: 91 mg/dL (ref 70–99)
Potassium: 3.9 mEq/L (ref 3.5–5.1)
Sodium: 132 mEq/L — ABNORMAL LOW (ref 135–145)
Total Bilirubin: 0.4 mg/dL (ref 0.2–1.2)
Total Protein: 7 g/dL (ref 6.0–8.3)

## 2023-02-22 LAB — TSH: TSH: 1.77 u[IU]/mL (ref 0.35–5.50)

## 2023-02-22 LAB — VITAMIN D 25 HYDROXY (VIT D DEFICIENCY, FRACTURES): VITD: 18.16 ng/mL — ABNORMAL LOW (ref 30.00–100.00)

## 2023-02-22 MED ORDER — VITAMIN D3 25 MCG (1000 UT) PO CAPS: 5000.0000 [IU] | ORAL_CAPSULE | Freq: Every day | ORAL | Status: DC

## 2023-02-22 NOTE — Assessment & Plan Note (Signed)
With history of vitamin D deficiency noted.  Check routine labs.  I suspect that some of her symptoms are postinfectious, i.e. post-COVID.  I would still expect gradual improvement in the meantime.  Check labs to look for identifiable/reversible causes.  At this point still okay for outpatient follow-up.  Supportive care in the meantime.

## 2023-02-25 DIAGNOSIS — H34811 Central retinal vein occlusion, right eye, with macular edema: Secondary | ICD-10-CM | POA: Diagnosis not present

## 2023-03-11 DIAGNOSIS — H25012 Cortical age-related cataract, left eye: Secondary | ICD-10-CM | POA: Diagnosis not present

## 2023-03-11 DIAGNOSIS — H26491 Other secondary cataract, right eye: Secondary | ICD-10-CM | POA: Diagnosis not present

## 2023-03-20 ENCOUNTER — Other Ambulatory Visit: Payer: Self-pay | Admitting: Family Medicine

## 2023-03-21 ENCOUNTER — Ambulatory Visit (INDEPENDENT_AMBULATORY_CARE_PROVIDER_SITE_OTHER): Payer: Medicare Other | Admitting: Family Medicine

## 2023-03-21 VITALS — BP 118/80 | HR 72 | Temp 98.6°F | Ht 64.0 in | Wt 141.0 lb

## 2023-03-21 DIAGNOSIS — E559 Vitamin D deficiency, unspecified: Secondary | ICD-10-CM | POA: Diagnosis not present

## 2023-03-21 DIAGNOSIS — R5383 Other fatigue: Secondary | ICD-10-CM

## 2023-03-21 DIAGNOSIS — F419 Anxiety disorder, unspecified: Secondary | ICD-10-CM | POA: Diagnosis not present

## 2023-03-21 NOTE — Progress Notes (Signed)
She is taking vitamin D, liquid drops.  Taking 5000 IU per day.  She had more chest/stomach irritation and burning, unclear if from vit D use.  She skipped it today and didn't have those symptoms.  She tolerated liquid vit D better than pills.  See after visit summary about trying to restart vitamin D replacement to lower dose.  Follow up re: fatigue.  Similar to last OV.  Some days with improved energy level but fatigue then sets in, later in the day.  We talked about pandemic considerations, then her neck pain, then the episode of covid in the few months.  All of this could have affected her energy level/fatigue.  "I'm tired of being tired."  We talked about her mood.  She had gone to counseling in distant past.  We talked about loss, strain with not being able to see her grandkids as much as she would like, etc.    Previous labs discussed with patient.TSH normal and she is not anemic.  Meds, vitals, and allergies reviewed.   ROS: Per HPI unless specifically indicated in ROS section   GEN: nad, alert and oriented HEENT: ncat NECK: supple w/o LA CV: rrr.  PULM: ctab, no inc wob ABD: soft, +bs EXT: no edema SKIN: Well-perfused.  30 minutes were devoted to patient care in this encounter (this includes time spent reviewing the patient's file/history, interviewing and examining the patient, counseling/reviewing plan with patient).

## 2023-03-21 NOTE — Patient Instructions (Signed)
Try skipping vitamin D for a few days.  Then restart at 2000 units a day for 1 week.  Then gradually increase to see if tolerated.   Update me as needed.  Think about counseling.  Let me know if you need help getting set up.  Take care.  Glad to see you.

## 2023-03-24 NOTE — Assessment & Plan Note (Signed)
Quite likely to be multifactorial between pandemic considerations, pain, other social stressors, COVID.  I think it makes sense to replace her vitamin D level and think about counseling and then update me as needed.  She agrees with plan.

## 2023-03-24 NOTE — Assessment & Plan Note (Signed)
She tolerated liquid vit D better than pills.  See after visit summary about trying to restart vitamin D replacement to lower dose.

## 2023-03-24 NOTE — Assessment & Plan Note (Signed)
Counseling would be a reasonable option.  Discussed.  Life stressors discussed with patient.  Still okay for outpatient follow-up.

## 2023-03-25 ENCOUNTER — Ambulatory Visit: Payer: Medicare Other | Admitting: Family Medicine

## 2023-04-04 ENCOUNTER — Encounter: Payer: Self-pay | Admitting: Family Medicine

## 2023-04-05 NOTE — Telephone Encounter (Signed)
Patient husband called in and wanted to follow up on this. He stated that he doesn't think she will need the appointment because she hasn't been taking it. He wants to know if she need it still. Thank you!

## 2023-04-07 ENCOUNTER — Other Ambulatory Visit: Payer: Self-pay | Admitting: Family Medicine

## 2023-04-07 MED ORDER — CHOLECALCIFEROL 10 MCG/ML (400 UNIT/ML) PO LIQD: 400.0000 [IU] | Freq: Every day | ORAL | Status: DC

## 2023-04-08 ENCOUNTER — Other Ambulatory Visit: Payer: Medicare Other

## 2023-04-10 DIAGNOSIS — H26491 Other secondary cataract, right eye: Secondary | ICD-10-CM | POA: Diagnosis not present

## 2023-04-22 DIAGNOSIS — H34811 Central retinal vein occlusion, right eye, with macular edema: Secondary | ICD-10-CM | POA: Diagnosis not present

## 2023-04-25 ENCOUNTER — Other Ambulatory Visit: Payer: Self-pay | Admitting: Family Medicine

## 2023-04-25 DIAGNOSIS — Z1231 Encounter for screening mammogram for malignant neoplasm of breast: Secondary | ICD-10-CM

## 2023-05-01 ENCOUNTER — Ambulatory Visit (INDEPENDENT_AMBULATORY_CARE_PROVIDER_SITE_OTHER): Payer: Medicare Other

## 2023-05-01 DIAGNOSIS — I495 Sick sinus syndrome: Secondary | ICD-10-CM | POA: Diagnosis not present

## 2023-05-02 LAB — CUP PACEART REMOTE DEVICE CHECK
Battery Impedance: 764 Ohm
Battery Remaining Longevity: 81 mo
Battery Voltage: 2.77 V
Brady Statistic AP VP Percent: 0 %
Brady Statistic AP VS Percent: 44 %
Brady Statistic AS VP Percent: 0 %
Brady Statistic AS VS Percent: 56 %
Date Time Interrogation Session: 20240529092123
Implantable Lead Connection Status: 753985
Implantable Lead Connection Status: 753985
Implantable Lead Implant Date: 20060620
Implantable Lead Implant Date: 20060620
Implantable Lead Location: 753859
Implantable Lead Location: 753860
Implantable Lead Model: 5076
Implantable Lead Model: 5076
Implantable Pulse Generator Implant Date: 20160219
Lead Channel Impedance Value: 706 Ohm
Lead Channel Impedance Value: 871 Ohm
Lead Channel Pacing Threshold Amplitude: 0.625 V
Lead Channel Pacing Threshold Amplitude: 0.625 V
Lead Channel Pacing Threshold Pulse Width: 0.4 ms
Lead Channel Pacing Threshold Pulse Width: 0.4 ms
Lead Channel Setting Pacing Amplitude: 2 V
Lead Channel Setting Pacing Amplitude: 2.5 V
Lead Channel Setting Pacing Pulse Width: 0.4 ms
Lead Channel Setting Sensing Sensitivity: 5.6 mV
Zone Setting Status: 755011
Zone Setting Status: 755011

## 2023-05-06 ENCOUNTER — Other Ambulatory Visit: Payer: Self-pay | Admitting: Cardiovascular Disease

## 2023-05-06 DIAGNOSIS — H5310 Unspecified subjective visual disturbances: Secondary | ICD-10-CM | POA: Diagnosis not present

## 2023-05-06 DIAGNOSIS — I119 Hypertensive heart disease without heart failure: Secondary | ICD-10-CM

## 2023-05-07 ENCOUNTER — Other Ambulatory Visit: Payer: Self-pay | Admitting: Family Medicine

## 2023-05-07 NOTE — Telephone Encounter (Signed)
Refill request for LORazepam 1 MG Oral Tablet   LOV - 03/21/23 Next OV - not scheduled Last refill - 02/06/23

## 2023-05-08 NOTE — Telephone Encounter (Signed)
Sent. Thanks.   

## 2023-05-21 ENCOUNTER — Ambulatory Visit (INDEPENDENT_AMBULATORY_CARE_PROVIDER_SITE_OTHER): Payer: Medicare Other | Admitting: Family Medicine

## 2023-05-21 ENCOUNTER — Encounter: Payer: Self-pay | Admitting: Family Medicine

## 2023-05-21 VITALS — BP 140/92 | HR 83 | Temp 98.9°F | Ht 64.0 in | Wt 142.0 lb

## 2023-05-21 DIAGNOSIS — R300 Dysuria: Secondary | ICD-10-CM

## 2023-05-21 LAB — POC URINALSYSI DIPSTICK (AUTOMATED)
Bilirubin, UA: NEGATIVE
Blood, UA: 200
Glucose, UA: NEGATIVE
Ketones, UA: NEGATIVE
Nitrite, UA: NEGATIVE
Protein, UA: POSITIVE — AB
Spec Grav, UA: 1.015 (ref 1.010–1.025)
Urobilinogen, UA: 0.2 E.U./dL
pH, UA: 5.5 (ref 5.0–8.0)

## 2023-05-21 MED ORDER — TERCONAZOLE 0.4 % VA CREA: 1.0000 | TOPICAL_CREAM | Freq: Every evening | VAGINAL | 3 refills | Status: DC | PRN

## 2023-05-21 MED ORDER — SULFAMETHOXAZOLE-TRIMETHOPRIM 800-160 MG PO TABS: 1.0000 | ORAL_TABLET | Freq: Two times a day (BID) | ORAL | 0 refills | Status: DC

## 2023-05-21 NOTE — Patient Instructions (Signed)
Drink plenty of water and start the antibiotics today.  We'll contact you with your lab report.  Take care.   

## 2023-05-21 NOTE — Progress Notes (Unsigned)
dysuria: yes, burning, frequency.   duration of symptoms: stated 4 days ago.   abdominal pain: no fevers:no back pain:no vomiting:no Has been drinking more water in the meantime.  Some improvement with that.  Usually with worse sx in the AM, less as the afternoon.    U/a d/w pt.    She had used terconazole cream prn for yeast infections.  It helped.  Used prn.  Prescription sent.  Meds, vitals, and allergies reviewed.  Per HPI unless specifically indicated in ROS section   GEN: nad, alert and oriented HEENT: ncat NECK: supple CV: rrr.  PULM: ctab, no inc wob ABD: soft, +bs, suprapubic area not tender EXT: no edema SKIN: well perfused.

## 2023-05-22 NOTE — Assessment & Plan Note (Signed)
Presumed cystitis.  Okay for outpatient follow-up.  Urinalysis discussed.  Check urine culture.  Start Septra.  Routine cautions given to patient.

## 2023-05-23 NOTE — Progress Notes (Signed)
Remote pacemaker transmission.   

## 2023-05-24 ENCOUNTER — Telehealth: Payer: Self-pay | Admitting: Family Medicine

## 2023-05-24 LAB — URINE CULTURE
MICRO NUMBER:: 15096709
SPECIMEN QUALITY:: ADEQUATE

## 2023-05-24 NOTE — Telephone Encounter (Signed)
Called and notified patient that Dr. Para March is out of the office today. He sent a staff message to Para March pool advising someone to review result and make sure it was sensitive to the antibiotic he put patient on. Reviewed results with Mayra Reel, NP and she advised the culture report shows sensitivity to the antibiotic prescribed. Patient will continue as is and notify us if she has any new or worsening symptoms.

## 2023-05-24 NOTE — Telephone Encounter (Signed)
Patient called in to follow up on her urine culture. She stated that she hasn't heard anything back yet. Informed patient that last message shows it as pending. Thank you!

## 2023-06-07 ENCOUNTER — Other Ambulatory Visit: Payer: Self-pay | Admitting: Family Medicine

## 2023-06-08 IMAGING — MG MM DIGITAL SCREENING BILAT W/ TOMO AND CAD
6 of 10 series · 6 of 30 positions shown · non-contrast
Comparison: Previous exam(s).

CLINICAL DATA: Screening.

EXAM:
DIGITAL SCREENING BILATERAL MAMMOGRAM WITH TOMOSYNTHESIS AND CAD
TECHNIQUE: Bilateral screening digital craniocaudal and mediolateral oblique
mammograms were obtained. Bilateral screening digital breast
tomosynthesis was performed. The images were evaluated with
computer-aided detection.

[L MLO synth-2D (1 of 2)]
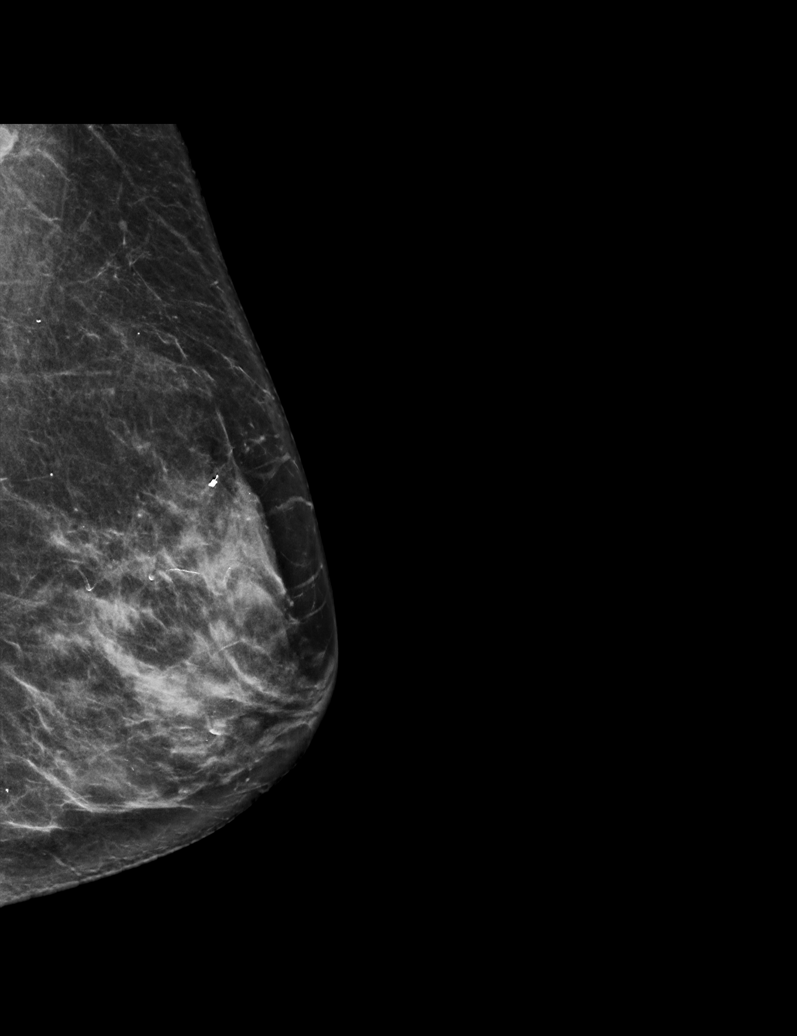

[L CC synth-2D]
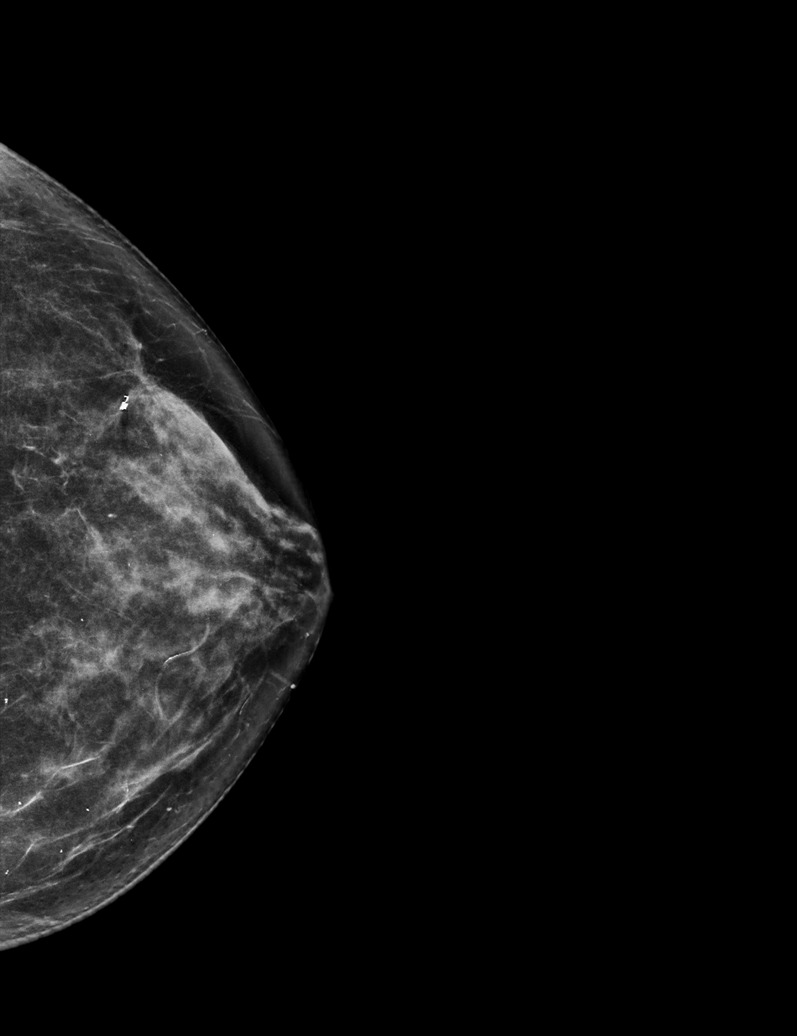

[L MLO synth-2D (2 of 2)]
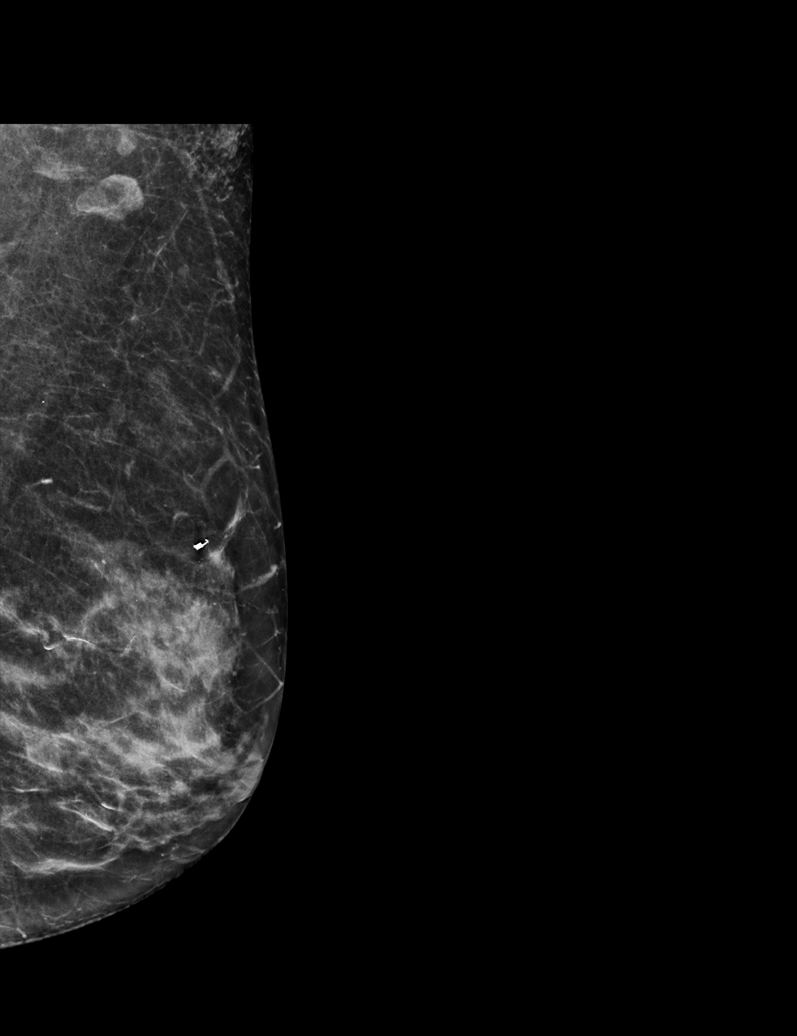

[R MLO synth-2D]
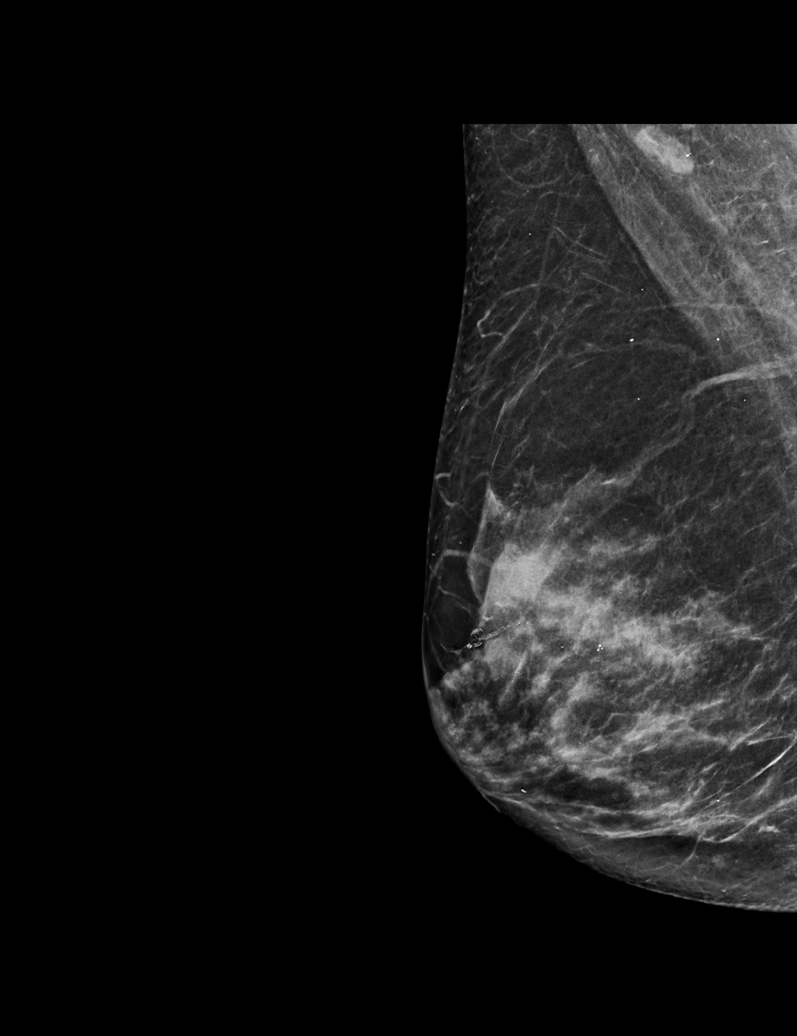

[R CC synth-2D]
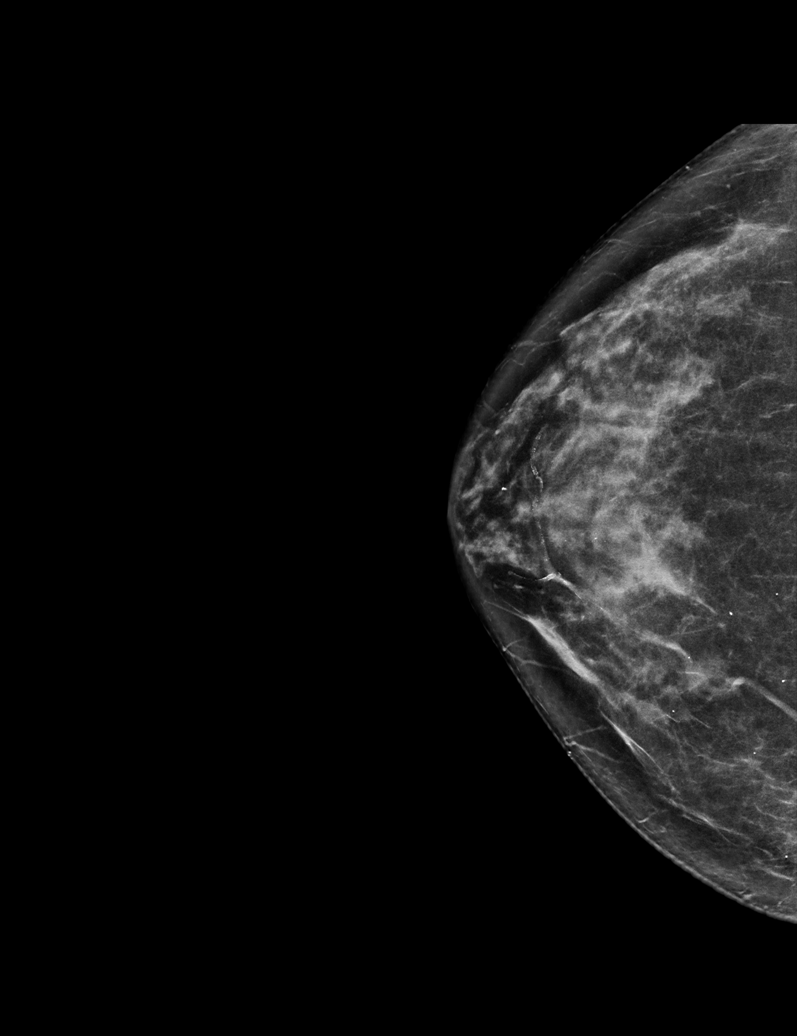

[L MLO tomo · tomo slice 32/63.0]
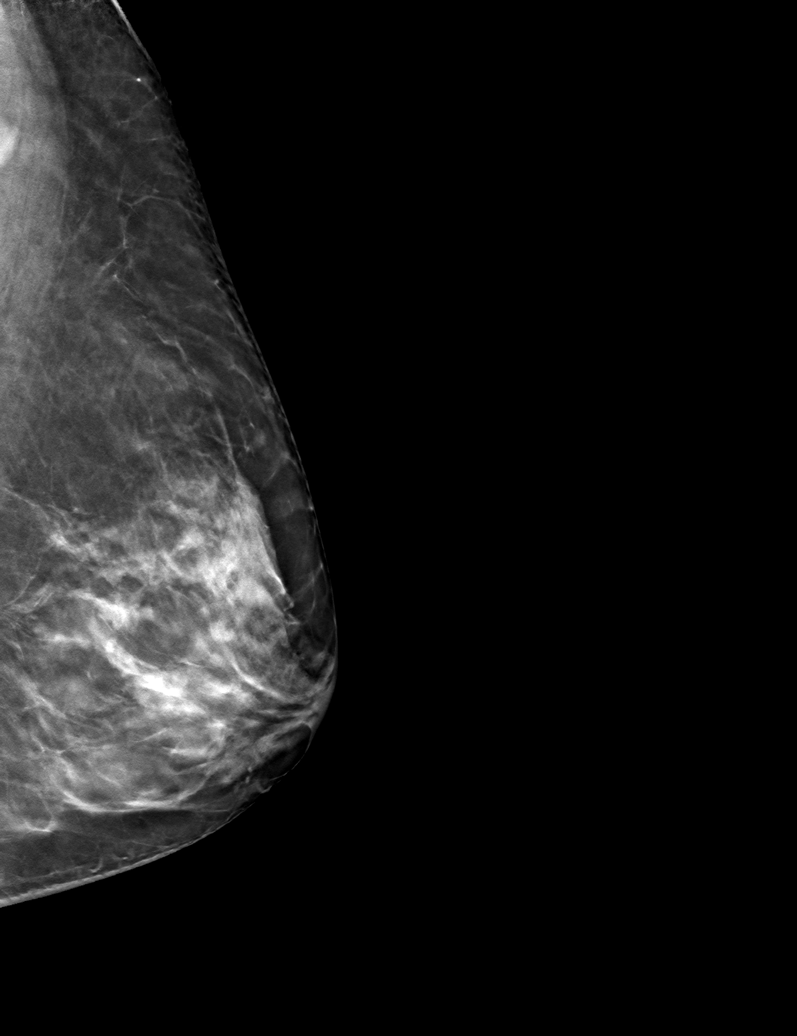

[6 of 30 positions shown; findings below may reference images not displayed]

ACR Breast Density Category c: The breast tissue is heterogeneously
dense, which may obscure small masses.
FINDINGS: There are no findings suspicious for malignancy.
IMPRESSION: No mammographic evidence of malignancy. A result letter of this
screening mammogram will be mailed directly to the patient.

RECOMMENDATION:
Screening mammogram in one year. (Code:Q3-W-BC3)

BI-RADS CATEGORY  1: Negative.

## 2023-06-10 ENCOUNTER — Ambulatory Visit
Admission: RE | Admit: 2023-06-10 | Discharge: 2023-06-10 | Disposition: A | Discharge status: Home / Self Care | Payer: Medicare Other | Source: Ambulatory Visit | Attending: Family Medicine | Admitting: Family Medicine

## 2023-06-10 DIAGNOSIS — Z1231 Encounter for screening mammogram for malignant neoplasm of breast: Secondary | ICD-10-CM | POA: Diagnosis not present

## 2023-06-12 ENCOUNTER — Other Ambulatory Visit: Payer: Self-pay | Admitting: Family Medicine

## 2023-06-12 DIAGNOSIS — R928 Other abnormal and inconclusive findings on diagnostic imaging of breast: Secondary | ICD-10-CM

## 2023-06-14 DIAGNOSIS — H34811 Central retinal vein occlusion, right eye, with macular edema: Secondary | ICD-10-CM | POA: Diagnosis not present

## 2023-06-20 ENCOUNTER — Ambulatory Visit
Admission: RE | Admit: 2023-06-20 | Discharge: 2023-06-20 | Disposition: A | Discharge status: Home / Self Care | Payer: Medicare Other | Source: Ambulatory Visit | Attending: Family Medicine | Admitting: Family Medicine

## 2023-06-20 ENCOUNTER — Other Ambulatory Visit: Payer: Self-pay | Admitting: Family Medicine

## 2023-06-20 DIAGNOSIS — R928 Other abnormal and inconclusive findings on diagnostic imaging of breast: Secondary | ICD-10-CM

## 2023-06-20 DIAGNOSIS — N6310 Unspecified lump in the right breast, unspecified quadrant: Secondary | ICD-10-CM

## 2023-06-20 DIAGNOSIS — N6315 Unspecified lump in the right breast, overlapping quadrants: Secondary | ICD-10-CM | POA: Diagnosis not present

## 2023-06-25 ENCOUNTER — Other Ambulatory Visit: Payer: Self-pay | Admitting: Internal Medicine

## 2023-06-25 ENCOUNTER — Ambulatory Visit
Admission: RE | Admit: 2023-06-25 | Discharge: 2023-06-25 | Disposition: A | Discharge status: Home / Self Care | Payer: Medicare Other | Source: Ambulatory Visit | Attending: Internal Medicine | Admitting: Internal Medicine

## 2023-06-25 ENCOUNTER — Ambulatory Visit
Admission: RE | Admit: 2023-06-25 | Discharge: 2023-06-25 | Disposition: A | Discharge status: Home / Self Care | Payer: Medicare Other | Source: Ambulatory Visit | Attending: Family Medicine | Admitting: Family Medicine

## 2023-06-25 DIAGNOSIS — N6310 Unspecified lump in the right breast, unspecified quadrant: Secondary | ICD-10-CM

## 2023-06-25 DIAGNOSIS — N6315 Unspecified lump in the right breast, overlapping quadrants: Secondary | ICD-10-CM | POA: Diagnosis not present

## 2023-06-25 DIAGNOSIS — G4733 Obstructive sleep apnea (adult) (pediatric): Secondary | ICD-10-CM

## 2023-06-25 HISTORY — PX: BREAST BIOPSY: SHX20

## 2023-07-08 DIAGNOSIS — H35033 Hypertensive retinopathy, bilateral: Secondary | ICD-10-CM | POA: Diagnosis not present

## 2023-07-08 DIAGNOSIS — Z961 Presence of intraocular lens: Secondary | ICD-10-CM | POA: Diagnosis not present

## 2023-07-08 DIAGNOSIS — H3582 Retinal ischemia: Secondary | ICD-10-CM | POA: Diagnosis not present

## 2023-07-08 DIAGNOSIS — H34811 Central retinal vein occlusion, right eye, with macular edema: Secondary | ICD-10-CM | POA: Diagnosis not present

## 2023-07-11 ENCOUNTER — Ambulatory Visit: Payer: Self-pay | Admitting: Surgery

## 2023-07-12 ENCOUNTER — Ambulatory Visit: Payer: Self-pay | Admitting: Surgery

## 2023-07-12 ENCOUNTER — Encounter: Payer: Self-pay | Admitting: Cardiovascular Disease

## 2023-07-12 ENCOUNTER — Telehealth: Payer: Self-pay | Admitting: *Deleted

## 2023-07-12 DIAGNOSIS — D241 Benign neoplasm of right breast: Secondary | ICD-10-CM | POA: Diagnosis not present

## 2023-07-12 NOTE — H&P (Signed)
Subjective    Chief Complaint: New Consultation (HERNIA)       History of Present Illness: [name] is a 77 y.o. female who is seen today as an office consultation at the request of Dr. Susann Givens for evaluation of New Consultation (HERNIA) .     This is an 77 year old female referred by PCP who presents with an enlarging mass in his left groin.  This resolves when he is supine.  He denies any obstructive symptoms.  He does report some discomfort associated with this.  He is referred to [place] for further evaluation.   The patient had a CT of the pelvis without contrast in January of this year that did not show any sign of hernia.  He had a CT of the abdomen and pelvis with contrast in October 2023 that also did not show any sign of inguinal hernia.   The patient has multiple comorbidities including permanent pacemaker, history of SVT, first-degree AV block, aortic atherosclerosis.  He is not on any blood thinners.  He lives independently with his wife.  He still drives.  He states that he is fairly active.  He has significant issues with constipation that seems to be worse with the enlarging hernia.  He reports minimal discomfort at his umbilicus.   The patient is accompanied by his daughter today.   Review of Systems: A complete review of systems was obtained from the patient.  I have reviewed this information and discussed as appropriate with the patient.  See HPI as well for other ROS.   Review of Systems  Constitutional: Negative.   HENT:  Positive for hearing loss.   Eyes: Negative.   Respiratory: Negative.    Cardiovascular: Negative.   Gastrointestinal:  Positive for constipation.  Genitourinary: Negative.   Musculoskeletal: Negative.   Skin:  Positive for itching.  Neurological: Negative.   Endo/Heme/Allergies:  Bruises/bleeds easily.  Psychiatric/Behavioral: Negative.          Medical History: Past Medical History      Past Medical History:  Diagnosis Date   Anxiety      History of cancer          Problem List     Patient Active Problem List  Diagnosis   Umbilical hernia without obstruction or gangrene   Non-recurrent unilateral inguinal hernia without obstruction or gangrene   Aortic atherosclerosis (CMS-HCC)   Complete heart block (CMS/HHS-HCC)   Essential hypertension   Hearing loss   HH (hiatus hernia)   History of prostate cancer   Hyperlipidemia LDL goal <130   Irritable bowel syndrome with constipation   Pacemaker   Stage 3a chronic kidney disease (CMS/HHS-HCC)        Past Surgical History       Past Surgical History:  Procedure Laterality Date   PROSTATE SURGERY            Allergies      Allergies  Allergen Reactions   Codeine Unknown        Medications Ordered Prior to Encounter        Current Outpatient Medications on File Prior to Visit  Medication Sig Dispense Refill   amLODIPine (NORVASC) 5 MG tablet Take 1 tablet by mouth once daily       cholecalciferol 1000 unit tablet Take by mouth       hydroCHLOROthiazide (HYDRODIURIL) 12.5 MG tablet Take 1 tablet by mouth once daily       loratadine (CLARITIN) 10 mg tablet Take 10 mg by  mouth once daily       omeprazole (PRILOSEC) 20 MG DR capsule Take 1 capsule by mouth 2 (two) times daily before meals       rosuvastatin (CRESTOR) 20 MG tablet Take 1 tablet by mouth once daily        No current facility-administered medications on file prior to visit.        Family History  History reviewed. No pertinent family history.      Tobacco Use History  Social History        Tobacco Use  Smoking Status Former   Types: Cigarettes  Smokeless Tobacco Never        Social History  Social History         Socioeconomic History   Marital status: Married  Tobacco Use   Smoking status: Former      Types: Cigarettes   Smokeless tobacco: Never  Vaping Use   Vaping status: Never Used  Substance and Sexual Activity   Alcohol use: Not Currently   Drug use: Never         Objective:         Vitals:    07/12/23 0947  BP: (!) 155/70  Pulse: 59  Temp: 36.8 C (98.2 F)  SpO2: 96%  Weight: 64 kg (141 lb 3.2 oz)  Height: 165.1 cm (5\' 5" )  PainSc: 0-No pain    Body mass index is 23.5 kg/m.   Physical Exam    Constitutional:  WDWN in NAD, conversant, no obvious deformities; lying in bed comfortably Eyes:  Pupils equal, round; sclera anicteric; moist conjunctiva; no lid lag HENT:  Oral mucosa moist; good dentition  Neck:  No masses palpated, trachea midline; no thyromegaly Lungs:  CTA bilaterally; normal respiratory effort CV:  Regular rate and rhythm; no murmurs; extremities well-perfused with no edema Abd:  +bowel sounds, soft, non-tender, no palpable organomegaly; treating umbilical hernia that is reducible.  There is palpable omentum within the hernia sac.  Defect is palpated at 2 cm. GU: Bilateral descended testes, no testicular masses, no signs of right inguinal hernia.  Patient has a large left inguinal hernia that is reducible when he is supine. Musc: Unsteady gait; ambulates with a cane.  No apparent clubbing or cyanosis in extremities Lymphatic:  No palpable cervical or axillary lymphadenopathy Skin:  Warm, dry; no sign of jaundice Psychiatric - alert and oriented x 4; calm mood and affect       Assessment and Plan:  Diagnoses and all orders for this visit:   Non-recurrent unilateral inguinal hernia without obstruction or gangrene   Umbilical hernia without obstruction or gangrene       We will first obtain cardiac clearance regarding his pacemaker.  We were then schedule him for a left inguinal hernia repair with mesh and an umbilical hernia repair with mesh.  The patient carries a diagnosis of irritable bowel syndrome but is possible that these enlarging hernias may be causing intermittent partial obstructions.   The surgical procedure has been discussed with the patient.  Potential risks, benefits, alternative treatments, and  expected outcomes have been explained.  All of the patient's questions at this time have been answered.  The likelihood of reaching the patient's treatment goal is good.  The patient understands the proposed surgical procedure and wishes to proceed.   [name] Strength Delbert Harness, MD  07/12/2023 11:56 AM

## 2023-07-12 NOTE — Telephone Encounter (Signed)
Patient is returning phone call.  °

## 2023-07-12 NOTE — Progress Notes (Signed)
PERIOPERATIVE PRESCRIPTION FOR IMPLANTED CARDIAC DEVICE PROGRAMMING   Patient Information:  Patient: [name]  MRN: [id number]  Date of Birth: 1945-12-21      Planned Procedure:  BREAST SURGERY ; INTRADUCTAL PAPILLOMA OF RIGHT BREAST   Date of Procedure:  TBD  Surgeon:  DR. MATTHEW TSUEI  Surgeon's Group or Practice Name:  [facility] Phone number:  [phone] Fax number:  [phone] ATTN: Michel Bickers, LPN    Device Information:   Clinic EP Physician:   Dr.Augustus Mealor Device Type:  Pacemaker Manufacturer and Phone #:  [employer]tronic: [id number] Pacemaker Dependent?:  No Date of Last Device Check:  05/01/2023        Normal Device Function?:  Yes     Electrophysiologist's Recommendations:   Have magnet available. Provide continuous ECG monitoring when magnet is used or reprogramming is to be performed.  Procedure may interfere with device function.  Magnet should be placed over device during procedure.  Per Device Clinic Standing Orders, Lenor Coffin  07/12/2023 2:14 PM

## 2023-07-12 NOTE — Telephone Encounter (Signed)
   Pre-operative Risk Assessment    Patient Name: [name]  DOB: 06/18/1946 MRN: [id number]      Request for Surgical Clearance    Procedure:   BREAST SURGERY ; INTRADUCTAL PAPILLOMA OF RIGHT BREAST  Date of Surgery:  Clearance TBD                                 Surgeon:  DR. MATTHEW TSUEI Surgeon's Group or Practice Name:  [facility] Phone number:  [phone] Fax number:  [phone] ATTN: Michel Bickers, LPN   Type of Clearance Requested:   - Medical ; NO MEDICATIONS LISTED AS NEEDING TO BE HELD ALSO NEED DEVICE CLEARANCE AS WELL; WILL FORWARD TO DEVICE CLINIC AS WELL   Type of Anesthesia:  General    Additional requests/questions:    Elpidio Anis   07/12/2023, 12:53 PM

## 2023-07-12 NOTE — Telephone Encounter (Signed)
Pt states that Tresa Endo from requesting office reached out and let her know that our office has already cleared her for surgery. I do not see where a clearance was sent to requesting office. Pt states that she prefers to wait to schedule tele visit until after we speak with requesting office to ensure she still needs it. I assured pt that we would call requesting office on Monday and verify if further clearance is needed

## 2023-07-12 NOTE — Telephone Encounter (Signed)
1st attempt to reach pt to schedule tele visit. Lvm  

## 2023-07-12 NOTE — Telephone Encounter (Signed)
   Name: [name]  DOB: 1946/02/01  MRN: [id number]  Primary Cardiologist: Verne Carrow, MD   Preoperative team, please contact this patient and set up a phone call appointment for further preoperative risk assessment. Please obtain consent and complete medication review. Thank you for your help. Last seen 01/30/2023   I confirm that guidance regarding antiplatelet and oral anticoagulation therapy has been completed and, if necessary, noted below.   Joni Reining, NP 07/12/2023, 4:22 PM [facility]

## 2023-07-15 NOTE — Telephone Encounter (Signed)
S/w the pt and she really is not comfortable doing a tele appt. Pt prefers in office. Pt has been scheduled to see Ronie Spies, [facility] 07/22/23 @ 9:15 at the [facility]. Location.   Pt thanked me for the help. I will update all parties involved.

## 2023-07-22 ENCOUNTER — Encounter: Payer: Self-pay | Admitting: Nurse Practitioner

## 2023-07-22 ENCOUNTER — Ambulatory Visit: Payer: Medicare Other | Admitting: Physician Assistant

## 2023-07-22 ENCOUNTER — Ambulatory Visit: Payer: Medicare Other | Admitting: Nurse Practitioner

## 2023-07-22 VITALS — BP 138/86 | HR 68 | Ht 64.0 in | Wt 144.0 lb

## 2023-07-22 DIAGNOSIS — I2510 Atherosclerotic heart disease of native coronary artery without angina pectoris: Secondary | ICD-10-CM | POA: Diagnosis not present

## 2023-07-22 DIAGNOSIS — Z950 Presence of cardiac pacemaker: Secondary | ICD-10-CM

## 2023-07-22 DIAGNOSIS — Z01810 Encounter for preprocedural cardiovascular examination: Secondary | ICD-10-CM

## 2023-07-22 DIAGNOSIS — I495 Sick sinus syndrome: Secondary | ICD-10-CM | POA: Diagnosis not present

## 2023-07-22 DIAGNOSIS — R002 Palpitations: Secondary | ICD-10-CM | POA: Diagnosis not present

## 2023-07-22 DIAGNOSIS — I4729 Other ventricular tachycardia: Secondary | ICD-10-CM

## 2023-07-22 DIAGNOSIS — I493 Ventricular premature depolarization: Secondary | ICD-10-CM

## 2023-07-22 DIAGNOSIS — I10 Essential (primary) hypertension: Secondary | ICD-10-CM | POA: Diagnosis not present

## 2023-07-22 NOTE — Progress Notes (Signed)
Cardiology Office Note:  .   Date:  07/22/2023  ID:  [name], DOB 18-Feb-1946, MRN [id number] PCP: Joaquim Nam, MD  [facility] Providers Cardiologist:  Verne Carrow, MD    Patient Profile: .      PMH Hypertension Nonsustained ventricular tachycardia PACs/PVCs Symptomatic bradycardia s/p PPM implant MDT dual chamber PPM implant 05/22/2005 Gen change 01/21/2014 Spontaneous subdural hematoma 2018 No surgical intervention required Sleep apnea GERD/hiatal hernia Coronary artery disease  Aortic atherosclerosis, LAD, RCA calcification noted on CT 04/2020  Most recent echocardiogram 04/17/2020 revealed normal LVEF 60 to 65%, normal diastolic parameters, normal RV, no significant valve disease.   Seen by Dr. Clifton James 01/14/2023 at which time BP was well-controlled.  Repatha was discussed for management of hyperlipidemia.  She did not tolerate Lipitor in the past.  She wanted to consider before starting. CT calcium score was also discussed. She was to call back if she elected to proceed.   Last cardiology clinic visit was 01/30/2023 with Francis Dowse, PA.  Device interrogation revealed normal function, no program changes were made. Low PVC burden on device.        History of Present Illness: .   [name] is a very pleasant 77 y.o. female who is here for preoperative cardiac evaluation for breast surgery for right intraductal papilloma.  She reports she has been doing well. She has regular palpitations that have not worsened recently and have not caused additional symptoms. Admits she has not been exercising since COVID. Has not been monitoring BP routinely. She denies chest pain, shortness of breath, lower extremity edema, fatigue, melena, presyncope, syncope, orthopnea, and PND.  ROS: See HPI       Studies Reviewed: Marland Kitchen   EKG Interpretation Date/Time:  Monday July 22 2023 11:10:02 EDT Ventricular Rate:  68 PR Interval:  168 QRS Duration:  84 QT  Interval:  424 QTC Calculation: 450 R Axis:   -45  Text Interpretation: Normal sinus rhythm Left anterior fascicular block Nonspecific T wave abnormality Confirmed by Eligha Bridegroom [phone]) on 07/22/2023 11:19:31 AM     Risk Assessment/Calculations:             Physical Exam:   VS:  BP 138/86 (BP Location: Left Arm, Cuff Size: Normal)   Pulse 68   Ht 5\' 4"  (1.626 m)   Wt 144 lb (65.3 kg)   SpO2 95%   BMI 24.72 kg/m    Wt Readings from Last 3 Encounters:  07/22/23 144 lb (65.3 kg)  05/21/23 142 lb (64.4 kg)  03/21/23 141 lb (64 kg)    GEN: Well nourished, well developed in no acute distress NECK: No JVD; No carotid bruits CARDIAC: RRR, no murmurs, rubs, gallops RESPIRATORY:  Clear to auscultation without rales, wheezing or rhonchi  ABDOMEN: Soft, non-tender, non-distended EXTREMITIES:  No edema; No deformity     ASSESSMENT AND PLAN: .    Preoperative cardiac evaluation: According to the Revised Cardiac Risk Index (RCRI), her Perioperative Risk of Major Cardiac Event is (%): 0.9. Her Functional Capacity in METs is: 7.04 according to the Duke Activity Status Index (DASI). The patient is doing well from a cardiac perspective. Therefore, based on ACC/AHA guidelines, the patient would be at acceptable risk for the planned procedure without further cardiovascular testing.  There is no request to hold cardiac medications.  I will forward assessment to requesting provider.  Hypertension: BP elevated initially, improved on my recheck but higher than goal. I have asked her to  monitor at home for 2 weeks and send in those readings. If BP remains elevated, could increase hydrochlorothiazide to 25 mg daily or alternatively increase diltiazem with appropriate monitoring based on which agent we increase.   SSS s/p PPM implant: Normal device function on remote device check 05/02/23.  No acute concerns today. Continue routine monitoring. Management per EP.   CAD: RCA, LAD calcifications noted  on CT 2021. She has not had any further imaging for risk stratification. She is asymptomatic but admits she has not been very active over the past few years. We will get CT calcium score for further evaluation of CAD.   Palpitations/Frequent PVCs: She has regular palpitations that are short lived and do not interfere with her daily activities. Brief atrial tachycardia noted on remote device check 05/02/23. Continue diltiazem.   NSVT: No evidence of NSVT on recent device download. No presyncope or syncope.  Continue diltiazem.       Dispo: February with EP/June with Dr. Clifton James  Signed, Eligha Bridegroom, NP-C

## 2023-07-22 NOTE — Patient Instructions (Addendum)
Medication Instructions:   Your physician recommends that you continue on your current medications as directed. Please refer to the Current Medication list given to you today.   *If you need a refill on your cardiac medications before your next appointment, please call your pharmacy*   Lab Work:  None ordered.  If you have labs (blood work) drawn today and your tests are completely normal, you will receive your results only by: MyChart Message (if you have MyChart) OR A paper copy in the mail If you have any lab test that is abnormal or we need to change your treatment, we will call you to review the results.   Testing/Procedures:  [name]  has ordered a CT coronary calcium score.   Test locations:  [facility]  [facility]  This is $99 out of pocket.   Coronary CalciumScan A coronary calcium scan is an imaging test used to look for deposits of calcium and other fatty materials (plaques) in the inner lining of the blood vessels of the heart (coronary arteries). These deposits of calcium and plaques can partly clog and narrow the coronary arteries without producing any symptoms or warning signs. This puts a person at risk for a heart attack. This test can detect these deposits before symptoms develop. Tell a health care provider about: Any allergies you have. All medicines you are taking, including vitamins, herbs, eye drops, creams, and over-the-counter medicines. Any problems you or family members have had with anesthetic medicines. Any blood disorders you have. Any surgeries you have had. Any medical conditions you have. Whether you are pregnant or may be pregnant. What are the risks? Generally, this is a safe procedure. However, problems may occur, including: Harm to a pregnant woman and her unborn baby. This test involves the use of radiation. Radiation exposure can be  dangerous to a pregnant woman and her unborn baby. If you are pregnant, you generally should not have this procedure done. Slight increase in the risk of cancer. This is because of the radiation involved in the test. What happens before the procedure? No preparation is needed for this procedure. What happens during the procedure? You will undress and remove any jewelry around your neck or chest. You will put on a hospital gown. Sticky electrodes will be placed on your chest. The electrodes will be connected to an electrocardiogram (ECG) machine to record a tracing of the electrical activity of your heart. A CT scanner will take pictures of your heart. During this time, you will be asked to lie still and hold your breath for 2-3 seconds while a picture of your heart is being taken. The procedure may vary among health care providers and hospitals. What happens after the procedure? You can get dressed. You can return to your normal activities. It is up to you to get the results of your test. Ask your health care provider, or the department that is doing the test, when your results will be ready. Summary A coronary calcium scan is an imaging test used to look for deposits of calcium and other fatty materials (plaques) in the inner lining of the blood vessels of the heart (coronary arteries). Generally, this is a safe procedure. Tell your health care provider if you are pregnant or may be pregnant. No preparation is needed for this procedure. A CT scanner will take pictures of your heart. You can return to your normal activities after the scan  is done. This information is not intended to replace advice given to you by your health care provider. Make sure you discuss any questions you have with your health care provider. Document Released: 05/17/2008 Document Revised: 10/08/2016 Document Reviewed: 10/08/2016 Elsevier Interactive Patient Education  2017 [employer].    Follow-Up: At [facility], you and your health needs are our priority.  As part of our continuing mission to provide you with exceptional heart care, we have created designated Provider Care Teams.  These Care Teams include your primary Cardiologist (physician) and Advanced Practice Providers (APPs -  Physician Assistants and Nurse Practitioners) who all work together to provide you with the care you need, when you need it.  We recommend signing up for the patient portal called "MyChart".  Sign up information is provided on this After Visit Summary.  MyChart is used to connect with patients for Virtual Visits (Telemedicine).  Patients are able to view lab/test results, encounter notes, upcoming appointments, etc.  Non-urgent messages can be sent to your provider as well.   To learn more about what you can do with MyChart, go to [email or web address].    Your next appointment:   10 month(s)  Provider:   Verne Carrow, MD     Other Instructions  Your physician wants you to follow-up in: 10 month follow up.  You will receive a reminder letter in the mail two months in advance. If you don't receive a letter, please call our office to schedule the follow-up appointment.  HOW TO TAKE YOUR BLOOD PRESSURE  Rest 5 minutes before taking your blood pressure. Don't  smoke or drink caffeinated beverages for at least 30 minutes before. Take your blood pressure before (not after) you eat. Sit comfortably with your back supported and both feet on the floor ( don't cross your legs). Elevate your arm to heart level on a table or a desk. Use the proper sized cuff.  It should fit smoothly and snugly around your bare upper arm.  There should be  Enough room to slip a fingertip under the cuff.  The bottom edge of the cuff should be 1 inch above the crease Of the elbow. Please monitor your blood pressure once daily 2 hours after your am medication. If you blood pressure Consistently remains above 120 (systolic) top  number or over 80 ( diastolic) bottom number X 3 days  Consecutively.  Please call our office at [phone] or send Mychart message.  Send readings in mychart or call triage @ [phone] in couple of weeks to report BP and heart rate.    ----Avoid cold medicines with D or DM at the end of them----

## 2023-07-29 ENCOUNTER — Ambulatory Visit: Payer: Medicare Other | Admitting: Cardiology

## 2023-07-29 ENCOUNTER — Other Ambulatory Visit: Payer: Self-pay | Admitting: Cardiovascular Disease

## 2023-07-29 DIAGNOSIS — I119 Hypertensive heart disease without heart failure: Secondary | ICD-10-CM

## 2023-07-30 ENCOUNTER — Other Ambulatory Visit: Payer: Self-pay | Admitting: Surgery

## 2023-07-30 DIAGNOSIS — D241 Benign neoplasm of right breast: Secondary | ICD-10-CM

## 2023-07-31 ENCOUNTER — Ambulatory Visit (INDEPENDENT_AMBULATORY_CARE_PROVIDER_SITE_OTHER): Payer: Medicare Other

## 2023-07-31 DIAGNOSIS — I495 Sick sinus syndrome: Secondary | ICD-10-CM | POA: Diagnosis not present

## 2023-07-31 LAB — CUP PACEART REMOTE DEVICE CHECK
Battery Impedance: 839 Ohm
Battery Remaining Longevity: 77 mo
Battery Voltage: 2.77 V
Brady Statistic AP VP Percent: 0 %
Brady Statistic AP VS Percent: 46 %
Brady Statistic AS VP Percent: 0 %
Brady Statistic AS VS Percent: 54 %
Date Time Interrogation Session: 20240828093600
Implantable Lead Connection Status: 753985
Implantable Lead Connection Status: 753985
Implantable Lead Implant Date: 20060620
Implantable Lead Implant Date: 20060620
Implantable Lead Location: 753859
Implantable Lead Location: 753860
Implantable Lead Model: 5076
Implantable Lead Model: 5076
Implantable Pulse Generator Implant Date: 20160219
Lead Channel Impedance Value: 653 Ohm
Lead Channel Impedance Value: 838 Ohm
Lead Channel Pacing Threshold Amplitude: 0.75 V
Lead Channel Pacing Threshold Amplitude: 0.75 V
Lead Channel Pacing Threshold Pulse Width: 0.4 ms
Lead Channel Pacing Threshold Pulse Width: 0.4 ms
Lead Channel Setting Pacing Amplitude: 2 V
Lead Channel Setting Pacing Amplitude: 2.5 V
Lead Channel Setting Pacing Pulse Width: 0.4 ms
Lead Channel Setting Sensing Sensitivity: 5.6 mV
Zone Setting Status: 755011
Zone Setting Status: 755011

## 2023-08-06 ENCOUNTER — Telehealth: Payer: Self-pay | Admitting: Cardiovascular Disease

## 2023-08-06 NOTE — Telephone Encounter (Signed)
Pt's husband would like to speak with the nurse about some BP readings from the last couple of weeks. Please advise

## 2023-08-06 NOTE — Telephone Encounter (Signed)
Left message for patient to call back  

## 2023-08-06 NOTE — Telephone Encounter (Signed)
Follow Up:      Patient's husband is returning a call from today.

## 2023-08-07 NOTE — Telephone Encounter (Signed)
Called pt spouse reports was trying to figure out how to send in BP readings.  Pt figured it out and sent BP readings through my chart.  Will forward to [employer], NP to review.

## 2023-08-08 ENCOUNTER — Other Ambulatory Visit: Payer: Self-pay | Admitting: Family Medicine

## 2023-08-08 NOTE — Telephone Encounter (Signed)
Refill request for LORazepam 1 MG Oral Tablet   LOV - 05/21/23 Next OV - 08/27/23 Last refill - 05/08/23 #120/2

## 2023-08-08 NOTE — Progress Notes (Signed)
Remote pacemaker transmission.   

## 2023-08-12 MED ORDER — DILTIAZEM HCL ER COATED BEADS 180 MG PO CP24: 180.0000 mg | ORAL_CAPSULE | Freq: Every day | ORAL | 0 refills | Status: DC

## 2023-08-14 ENCOUNTER — Telehealth: Payer: Self-pay | Admitting: Cardiovascular Disease

## 2023-08-14 NOTE — Telephone Encounter (Signed)
Pt c/o medication issue:  1. Name of Medication: Diltiazem  2. How are you currently taking this medication (dosage and times per day)? 1 time a day  3. Are you having a reaction (difficulty breathing--STAT)?   4. What is your medication issue? Wants Michalene to call if possible. He thinks patient might be having a reaction to the Diltiazem

## 2023-08-15 MED ORDER — DILTIAZEM HCL ER 120 MG PO TB24: 120.0000 mg | ORAL_TABLET | Freq: Every day | ORAL | Status: DC

## 2023-08-15 NOTE — Addendum Note (Signed)
Addended by: Levi Aland on: 08/15/2023 03:13 PM   Modules accepted: Orders

## 2023-08-15 NOTE — Telephone Encounter (Signed)
Spoke w the patient and her husband.  She is feeling much better today.  This am her BP was 119/83.  No blood pressures were checked the last couple days when she was feeling so poorly.  She took the Cardizem 120 mg dose this am and is going to stay on that instead of the increased dose.  She wrote in to Eligha Bridegroom, NP earlier today re: the symptoms, see encounter 9/3 for complete details.  Will continue to monitor BPs, symptoms.

## 2023-08-17 ENCOUNTER — Other Ambulatory Visit: Payer: Self-pay

## 2023-08-17 ENCOUNTER — Encounter (HOSPITAL_BASED_OUTPATIENT_CLINIC_OR_DEPARTMENT_OTHER): Payer: Self-pay

## 2023-08-17 ENCOUNTER — Emergency Department (HOSPITAL_BASED_OUTPATIENT_CLINIC_OR_DEPARTMENT_OTHER)
Admission: EM | Admit: 2023-08-17 | Discharge: 2023-08-17 | Disposition: A | Discharge status: Home / Self Care | Payer: Medicare Other | Attending: Emergency Medicine | Admitting: Emergency Medicine

## 2023-08-17 DIAGNOSIS — I10 Essential (primary) hypertension: Secondary | ICD-10-CM | POA: Diagnosis not present

## 2023-08-17 LAB — COMPREHENSIVE METABOLIC PANEL
ALT: 11 U/L (ref 0–44)
AST: 14 U/L — ABNORMAL LOW (ref 15–41)
Albumin: 4.2 g/dL (ref 3.5–5.0)
Alkaline Phosphatase: 47 U/L (ref 38–126)
Anion gap: 8 (ref 5–15)
BUN: 16 mg/dL (ref 8–23)
CO2: 28 mmol/L (ref 22–32)
Calcium: 8.6 mg/dL — ABNORMAL LOW (ref 8.9–10.3)
Chloride: 93 mmol/L — ABNORMAL LOW (ref 98–111)
Creatinine, Ser: 0.71 mg/dL (ref 0.44–1.00)
GFR, Estimated: >60 mL/min (ref >60)
Glucose, Bld: 115 mg/dL — ABNORMAL HIGH (ref 70–99)
Potassium: 3.7 mmol/L (ref 3.5–5.1)
Sodium: 129 mmol/L — ABNORMAL LOW (ref 135–145)
Total Bilirubin: 0.4 mg/dL (ref 0.3–1.2)
Total Protein: 6.9 g/dL (ref 6.5–8.1)

## 2023-08-17 LAB — CBC WITH DIFFERENTIAL/PLATELET
Abs Immature Granulocytes: 0.01 10*3/uL (ref 0.00–0.07)
Basophils Absolute: 0 10*3/uL (ref 0.0–0.1)
Basophils Relative: 0 %
Eosinophils Absolute: 0 10*3/uL (ref 0.0–0.5)
Eosinophils Relative: 0 %
HCT: 38.4 % (ref 36.0–46.0)
Hemoglobin: 13.7 g/dL (ref 12.0–15.0)
Immature Granulocytes: 0 %
Lymphocytes Relative: 16 %
Lymphs Abs: 0.9 10*3/uL (ref 0.7–4.0)
MCH: 32.5 pg (ref 26.0–34.0)
MCHC: 35.7 g/dL (ref 30.0–36.0)
MCV: 91 fL (ref 80.0–100.0)
Monocytes Absolute: 0.5 10*3/uL (ref 0.1–1.0)
Monocytes Relative: 10 %
Neutro Abs: 3.9 10*3/uL (ref 1.7–7.7)
Neutrophils Relative %: 74 %
Platelets: 214 10*3/uL (ref 150–400)
RBC: 4.22 MIL/uL (ref 3.87–5.11)
RDW: 12.2 % (ref 11.5–15.5)
WBC: 5.3 10*3/uL (ref 4.0–10.5)
nRBC: 0 % (ref 0.0–0.2)

## 2023-08-17 LAB — TROPONIN I (HIGH SENSITIVITY): Troponin I (High Sensitivity): 5 ng/L (ref <18)

## 2023-08-17 NOTE — ED Notes (Addendum)
Unable to interrogate pacemaker at this time. Device not able to locate the device. Provider PA. Johana made aware. MD Silverio Lay, stated an interrogation of the pacemaker is not needed.

## 2023-08-17 NOTE — ED Notes (Signed)
Patient verbalizes understanding of discharge instructions. Opportunity for questioning and answers were provided. Patient discharged from ED.  °

## 2023-08-17 NOTE — ED Provider Notes (Signed)
[place] EMERGENCY DEPARTMENT AT [facility] Provider Note   CSN: [id number] Arrival date & time: 08/17/23  1508     History HTN, Bradycardia  Chief Complaint  Patient presents with   Hypertension   Fatigue    [name] is a 77 y.o. female.  76 y.o female with a PMH of hypertension, bradycardia with a [employer] pacemaker in place presents to the ED with a chief complaint of weakness and elevated blood pressure.  Patient reports she had her blood pressure medication such as diltiazem adjusted from 100 mg to 180 on Tuesday, has been taking this medication and has felt worse over the past 2 days.  In addition, she also also taking HCTZ.  Yesterday after taking her diltiazem 180 along with HCTZ she felt "as weak as water ".  In addition she had 1 episode of diarrhea prior to arrival in the ED, there was no blood in her stool noted.  They report recording her blood pressure at home with a systolic in the 160s diastolic in the 90s.  She does have a pacemaker in place to help with bradycardia but has not felt any chest pain, or shortness of breath.  The history is provided by the patient and the spouse.  Hypertension Pertinent negatives include no chest pain, no abdominal pain and no shortness of breath.       Home Medications Prior to Admission medications   Medication Sig Start Date End Date Taking? Authorizing Provider  acetaminophen (TYLENOL) 500 MG tablet Take 1,000 mg by mouth every 6 (six) hours as needed (pain).    [provider]  Carboxymethylcell-Hypromellose 0.25-0.3 % GEL Place 1 application into both eyes at bedtime.     [provider]  cholecalciferol (VITAMIN D3) 10 MCG/ML LIQD oral liquid Take 1 mL (400 Units total) by mouth daily. 04/07/23   Joaquim Nam, MD  diltiazem (CARDIZEM LA) 120 MG 24 hr tablet Take 1 tablet (120 mg total) by mouth daily. 08/15/23   Swinyer, Zachary George, NP  famotidine (PEPCID) 20 MG tablet Take 1 tablet by mouth  twice daily 03/20/23   Joaquim Nam, MD  hydrochlorothiazide (HYDRODIURIL) 12.5 MG tablet Take 1 tablet by mouth once daily 06/07/23   Joaquim Nam, MD  LORazepam (ATIVAN) 1 MG tablet TAKE 1 TABLET BY MOUTH EVERY 6 HOURS AS NEEDED 08/09/23   Joaquim Nam, MD  Polyethyl Glycol-Propyl Glycol (SYSTANE OP) Place 1 drop into both eyes daily as needed (dry eyes).     [provider]  terconazole (TERAZOL 7) 0.4 % vaginal cream Place 1 applicator vaginally at bedtime as needed. 05/21/23   Joaquim Nam, MD      Allergies    Hydrocodone; Antihistamines, loratadine-type; Bisoprolol-hydrochlorothiazide; Cephalexin; Codeine; Demerol [meperidine]; Epinephrine; Erythromycin; Hyoscyamine sulfate; Inderal [propranolol]; Lidocaine; Lipitor [atorvastatin]; Losartan potassium; Morphine; Nebivolol; Paxil [paroxetine hcl]; Penicillins; Prednisone; Propranolol hcl; Sertraline hcl; Tetracycline; Toprol xl [metoprolol succinate]; Verapamil; Hydralazine; and Oxycodone-acetaminophen    Review of Systems   Review of Systems  Constitutional:  Negative for chills and fever.  Respiratory:  Negative for shortness of breath.   Cardiovascular:  Negative for chest pain.  Gastrointestinal:  Positive for diarrhea. Negative for abdominal pain, nausea and vomiting.  Genitourinary:  Negative for flank pain.  Musculoskeletal:  Negative for back pain.  All other systems reviewed and are negative.   Physical Exam Updated Vital Signs BP (!) 173/81   Pulse 68   Temp 98.6 F (37 C) (Oral)  Resp 20   Ht 5\' 4"  (1.626 m)   Wt 64.4 kg   SpO2 99%   BMI 24.37 kg/m  Physical Exam Vitals and nursing note reviewed.  Constitutional:      Appearance: Normal appearance.  HENT:     Head: Normocephalic and atraumatic.     Mouth/Throat:     Mouth: Mucous membranes are dry.  Eyes:     Pupils: Pupils are equal, round, and reactive to light.  Cardiovascular:     Rate and Rhythm: Normal rate.  Pulmonary:      Effort: Pulmonary effort is normal.     Breath sounds: No wheezing or rales.  Abdominal:     General: Abdomen is flat.     Palpations: Abdomen is soft.  Musculoskeletal:     Cervical back: Normal range of motion and neck supple.  Skin:    General: Skin is warm and dry.  Neurological:     Mental Status: She is alert and oriented to person, place, and time.     ED Results / Procedures / Treatments   Labs (all labs ordered are listed, but only abnormal results are displayed) Labs Reviewed  COMPREHENSIVE METABOLIC PANEL - Abnormal; Notable for the following components:      Result Value   Sodium 129 (*)    Chloride 93 (*)    Glucose, Bld 115 (*)    Calcium 8.6 (*)    AST 14 (*)    All other components within normal limits  CBC WITH DIFFERENTIAL/PLATELET  TROPONIN I (HIGH SENSITIVITY)    EKG None  Radiology No results found.  Procedures Procedures    Medications Ordered in ED Medications - No data to display  ED Course/ Medical Decision Making/ A&P Clinical Course as of 08/17/23 1710  Sat Aug 17, 2023  1656 Sodium(!): 129 [JS]    Clinical Course User Index [JS] Claude Manges, PA-C                                 Medical Decision Making Amount and/or Complexity of Data Reviewed Labs: ordered. Decision-making details documented in ED Course.    This patient presents to the ED for concern of elevated BP and weakness, this involves a number of treatment options, and is a complaint that carries with it a high risk of complications and morbidity.  The differential diagnosis includes electrolyte derangement, medication adjustment versus ACS.    Co morbidities: Discussed in HPI   Brief History:  See HPI.   EMR reviewed including pt PMHx, past surgical history and past visits to ER.   See HPI for more details   Lab Tests:  I ordered and independently interpreted labs.  The pertinent results include:    CBC with no leukocytosis, hemoglobin is within  normal limits. CMP with low sodium at 129, creatine is within normal limits. LFTS are within normal limits.  Troponin is negative, she is here without any chest pain, no shortness of breath, no headache.   Imaging Studies:  No imaging studies ordered for this patient  Medicines ordered:  N/A  Reevaluation:  After the interventions noted above I re-evaluated patient and found that they have :stayed the same  Social Determinants of Health:  The patient's social determinants of health were a factor in the care of this patient  Problem List / ED Course:  Patient here with recent medication changes such as increase in her diltiazem  to 180, and combination she is also on HCTZ 12 mg.  Followed closely by Dr. Clifton James.  This medication was adjusted on Tuesday, she noted her blood pressure to be elevated, feel that this medication makes her feel somewhat jittery along with anxious and is having some palpitations.  She is not having any chest pain, no shortness of breath.  Blood pressure here on arrival with a systolic in the 170s diastolic in the 80s, she is afebrile and normal heart rate.  She does have a pacemaker in place for a prior history of bradycardia, no episodes of bradycardia noted while in the ED.  CBC is unremarkable hemoglobin is normal.  CMP remarkable for hyponatremia suspect likely due to HCTZ.  LFTs are within normal limits.  Troponin was also negative. She was taking her HCTZ every other day, however I do not feel that this is likely helping with blood pressure control, therefore instructed to take HCTZ daily but will return back to diltiazem 100 mg.  She does have good outpatient follow-up with cardiology, return precautions discussed at length.  Patient hemodynamically stable for discharge.  Dispostion:  After consideration of the diagnostic results and the patients response to treatment, I feel that the patent would benefit from appropriate follow up with cardiologist Dr.  Clifton James.   Portions of this note were generated with Scientist, clinical (histocompatibility and immunogenetics). Dictation errors may occur despite best attempts at proofreading.   Final Clinical Impression(s) / ED Diagnoses Final diagnoses:  Hypertension, unspecified type    Rx / DC Orders ED Discharge Orders     None         Claude Manges, PA-C 08/17/23 1710    Charlynne Pander, MD 08/17/23 [phone]

## 2023-08-17 NOTE — Discharge Instructions (Addendum)
Your laboratory results are within normal limits today.  We discussed taking your HCTZ daily instead of every other day.  You will need to make an appointment or to follow-up with your cardiologist for further blood pressure control.

## 2023-08-17 NOTE — ED Triage Notes (Signed)
Patient arrives with complaints of elevated blood pressure (160's systolic) x1 day. Patient takes her blood pressure at home (as requested by her Cardiologist). Patient also reports increased fatigue recently which may be related to adjustments to her cardiac medications.

## 2023-08-18 ENCOUNTER — Other Ambulatory Visit: Payer: Self-pay | Admitting: Family Medicine

## 2023-08-18 DIAGNOSIS — E559 Vitamin D deficiency, unspecified: Secondary | ICD-10-CM

## 2023-08-18 DIAGNOSIS — I10 Essential (primary) hypertension: Secondary | ICD-10-CM

## 2023-08-19 ENCOUNTER — Encounter: Payer: Self-pay | Admitting: Cardiovascular Disease

## 2023-08-20 ENCOUNTER — Ambulatory Visit: Payer: Medicare Other | Attending: Physician Assistant | Admitting: Physician Assistant

## 2023-08-20 ENCOUNTER — Other Ambulatory Visit: Payer: Medicare Other

## 2023-08-20 ENCOUNTER — Encounter: Payer: Self-pay | Admitting: Physician Assistant

## 2023-08-20 VITALS — BP 146/82 | HR 67 | Ht 64.0 in | Wt 142.0 lb

## 2023-08-20 DIAGNOSIS — I2510 Atherosclerotic heart disease of native coronary artery without angina pectoris: Secondary | ICD-10-CM | POA: Diagnosis not present

## 2023-08-20 DIAGNOSIS — I495 Sick sinus syndrome: Secondary | ICD-10-CM

## 2023-08-20 DIAGNOSIS — R002 Palpitations: Secondary | ICD-10-CM | POA: Diagnosis not present

## 2023-08-20 DIAGNOSIS — I10 Essential (primary) hypertension: Secondary | ICD-10-CM

## 2023-08-20 DIAGNOSIS — I4729 Other ventricular tachycardia: Secondary | ICD-10-CM | POA: Diagnosis not present

## 2023-08-20 DIAGNOSIS — E871 Hypo-osmolality and hyponatremia: Secondary | ICD-10-CM | POA: Diagnosis not present

## 2023-08-20 MED ORDER — DILTIAZEM HCL ER COATED BEADS 180 MG PO CP24: 180.0000 mg | ORAL_CAPSULE | Freq: Every day | ORAL | 3 refills | Status: DC

## 2023-08-20 NOTE — Patient Instructions (Signed)
Medication Instructions:  Stop hydrochlorothiazide Start diltiazem 180 mg daily *If you need a refill on your cardiac medications before your next appointment, please call your pharmacy*  Lab Work: BMET-TODAY If you have labs (blood work) drawn today and your tests are completely normal, you will receive your results only by: MyChart Message (if you have MyChart) OR A paper copy in the mail If you have any lab test that is abnormal or we need to change your treatment, we will call you to review the results.   Follow-Up: At [facility], you and your health needs are our priority.  As part of our continuing mission to provide you with exceptional heart care, we have created designated Provider Care Teams.  These Care Teams include your primary Cardiologist (physician) and Advanced Practice Providers (APPs -  Physician Assistants and Nurse Practitioners) who all work together to provide you with the care you need, when you need it.  Your next appointment:   2-3 week(s)  Provider:   Eligha Bridegroom, NP

## 2023-08-20 NOTE — Progress Notes (Signed)
Cardiology Office Note:  .   Date:  08/20/2023  ID:  [name], DOB 07/23/1946, MRN [id number] PCP: Joaquim Nam, MD   HeartCare Providers Cardiologist:  Verne Carrow, MD    History of Present Illness: .   [name] is a 77 y.o. female with history of HTN, SSS s/p pacemaker, CAD calcification LAD and RCA on CT 2021, palpitatioons with frequent PVC's, NSVT.  Patient was seen 07/22/23 for preop clearance and BP elevated. She monitored it for 2 weeks and it was elevated. Diltiazem increased 180 mg  a day. She felt very tired, shaky and stomach discomfort so went back to 120 mg daily. She went to ED 08/17/23 with elevated BP and sodium 129. She was advised to take hydrochlorothiazide daily. Coronary calcium score also ordered.  Patient here with her husband. She gets tired and has very high anxiety. She's still having the severe fatigue so now they aren't sure the increased diltiazem caused any problem. This is all stressing her out and she gets achy in her neck.Hasn't been exercising lately.  ROS:    Studies Reviewed: Marland Kitchen         Prior CV Studies:      Risk Assessment/Calculations:           Physical Exam:   VS:  BP (!) 146/82   Pulse 67   Ht 5\' 4"  (1.626 m)   Wt 142 lb (64.4 kg)   SpO2 96%   BMI 24.37 kg/m    Wt Readings from Last 3 Encounters:  08/20/23 142 lb (64.4 kg)  08/17/23 142 lb (64.4 kg)  07/22/23 144 lb (65.3 kg)    GEN: Well nourished, well developed in no acute distress NECK: No JVD; No carotid bruits CARDIAC:  RRR, no murmurs, rubs, gallops RESPIRATORY:  Clear to auscultation without rales, wheezing or rhonchi  ABDOMEN: Soft, non-tender, non-distended EXTREMITIES:  No edema; No deformity   ASSESSMENT AND PLAN: .       Hypertension: BP elevated has been elevated but didn't tolerate higher dose diltiazem-only took 2 doses and had extreme fatigue. She still has extreme fatigue on 120 mg dose. Na 129. Will stop hydrochlorothiazide,  recheck bmet today. She's willing to try higher dose diltiazem 180 mg daily again. Many drug allergies and intolerances. If BP remains high she may have to be referred to HTN clinic or pharmacy team. This shouldn't prohibit her from proceeding with surgery she's been cleared for.   Hyponatremia-Na 129 in ED-recheck today.   SSS s/p PPM implant: Normal device function on remote device check 07/2023.  Some NSVT. Management per EP.    CAD: RCA, LAD calcifications noted on CT 2021. CT calcium score ordered by another provider   Palpitations/Frequent PVCs: She has regular palpitations that are short lived and do not interfere with her daily activities. Brief NSVT noted on remote device check 07/2023. Continue diltiazem.    NSVT: NSVT on recent device download. No presyncope or syncope.  Continue diltiazem.             Dispo: f/u in several weeks  Signed, Jacolyn Reedy, PA-C

## 2023-08-21 ENCOUNTER — Telehealth: Payer: Self-pay | Admitting: Physician Assistant

## 2023-08-21 DIAGNOSIS — I10 Essential (primary) hypertension: Secondary | ICD-10-CM

## 2023-08-21 DIAGNOSIS — Z79899 Other long term (current) drug therapy: Secondary | ICD-10-CM

## 2023-08-21 NOTE — Telephone Encounter (Signed)
-----   Message from Jacolyn Reedy sent at 08/21/2023  8:19 AM EDT ----- Sodium down to 128, could be causing a lot of her symptoms. Lets repeat it Friday to make sure it's improving since we stopped her HCTZ

## 2023-08-21 NOTE — Telephone Encounter (Signed)
Discussed lab results and recommendation with patient.  BMET ordered, lab appt scheduled for 08/23/23.  Patient verbalized understanding and expressed appreciation for call.

## 2023-08-23 ENCOUNTER — Ambulatory Visit: Payer: Medicare Other | Attending: Family Medicine

## 2023-08-23 DIAGNOSIS — Z79899 Other long term (current) drug therapy: Secondary | ICD-10-CM

## 2023-08-23 DIAGNOSIS — I10 Essential (primary) hypertension: Secondary | ICD-10-CM | POA: Diagnosis not present

## 2023-08-24 LAB — BASIC METABOLIC PANEL
BUN/Creatinine Ratio: 17 (ref 12–28)
BUN: 14 mg/dL (ref 8–27)
CO2: 25 mmol/L (ref 20–29)
Calcium: 8.9 mg/dL (ref 8.7–10.3)
Chloride: 93 mmol/L — ABNORMAL LOW (ref 96–106)
Creatinine, Ser: 0.81 mg/dL (ref 0.57–1.00)
Glucose: 106 mg/dL — ABNORMAL HIGH (ref 70–99)
Potassium: 4.4 mmol/L (ref 3.5–5.2)
Sodium: 135 mmol/L (ref 134–144)
eGFR: 76 mL/min/{1.73_m2} (ref >59)

## 2023-08-27 ENCOUNTER — Encounter: Payer: Medicare Other | Admitting: Family Medicine

## 2023-08-28 DIAGNOSIS — H34811 Central retinal vein occlusion, right eye, with macular edema: Secondary | ICD-10-CM | POA: Diagnosis not present

## 2023-08-29 ENCOUNTER — Other Ambulatory Visit (HOSPITAL_COMMUNITY): Payer: Medicare Other

## 2023-09-02 ENCOUNTER — Encounter: Payer: Self-pay | Admitting: Family Medicine

## 2023-09-02 ENCOUNTER — Ambulatory Visit (INDEPENDENT_AMBULATORY_CARE_PROVIDER_SITE_OTHER): Payer: Medicare Other | Admitting: Family Medicine

## 2023-09-02 VITALS — BP 122/80 | HR 75 | Temp 98.3°F | Ht 64.0 in | Wt 141.0 lb

## 2023-09-02 DIAGNOSIS — I10 Essential (primary) hypertension: Secondary | ICD-10-CM

## 2023-09-02 NOTE — Assessment & Plan Note (Signed)
Continue as is for now but consider doxazosin vs lower dose of diltiazem if she doesn't acclimate to current BP.  Defer flu shot today.  D/w pt that she may need a higher BP than goal.   She will f/u with cardiology next week.

## 2023-09-02 NOTE — Progress Notes (Signed)
Fatigue for several months but has gotten worse since diltiazem was increased to 180mg .   Most recent Na 135 off hydrochlorothiazide.  She isn't lightheaded on standing.  BP controlled yesterday and over the last week.  Dec in appetite recently.  Husband was asking about checking magnesium level.  Discussed.  Lab pending.   She had recurrent GI upset with vit D replacement.    Med intolerances d/w pt.  BP med options d/w pt.   Meds, vitals, and allergies reviewed.   ROS: Per HPI unless specifically indicated in ROS section   GEN: nad, alert and oriented HEENT: ncat NECK: supple w/o LA CV: rrr.  PULM: ctab, no inc wob ABD: soft, +bs EXT: no edema SKIN: well perfused.   33 minutes were devoted to patient care in this encounter (this includes time spent reviewing the patient's file/history, interviewing and examining the patient, counseling/reviewing plan with patient).

## 2023-09-02 NOTE — Patient Instructions (Signed)
Continue as is for now but consider doxazosin vs lower dose of diltiazem.  Defer flu shot today.   Keep checking your BP and update cardiology next week.   Take care.  Glad to see you.

## 2023-09-03 LAB — MAGNESIUM: Magnesium: 2.2 mg/dL (ref 1.5–2.5)

## 2023-09-04 ENCOUNTER — Ambulatory Visit (HOSPITAL_COMMUNITY): Admission: RE | Admit: 2023-09-04 | Payer: Medicare Other | Source: Home / Self Care | Admitting: Surgery

## 2023-09-04 ENCOUNTER — Encounter (HOSPITAL_COMMUNITY): Admission: RE | Payer: Self-pay | Source: Home / Self Care

## 2023-09-04 SURGERY — BREAST LUMPECTOMY WITH RADIOACTIVE SEED LOCALIZATION
Anesthesia: General | Laterality: Right

## 2023-09-09 NOTE — Progress Notes (Unsigned)
Cardiology Office Note:  .   Date:  09/10/2023  ID:  [name], DOB 06-30-1946, MRN [id number] PCP: Joaquim Nam, MD   HeartCare Providers Cardiologist:  Verne Carrow, MD    Patient Profile: .      PMH Hypertension Nonsustained ventricular tachycardia PACs/PVCs Symptomatic bradycardia s/p PPM implant MDT dual chamber PPM implant 05/22/2005 Gen change 01/21/2014 Spontaneous subdural hematoma 2018 No surgical intervention required Sleep apnea GERD/hiatal hernia Coronary artery disease  Aortic atherosclerosis, LAD, RCA calcification noted on CT 04/2020  Most recent echocardiogram 04/17/2020 revealed normal LVEF 60 to 65%, normal diastolic parameters, normal RV, no significant valve disease.   Seen by Dr. Clifton James 01/14/2023 at which time BP was well-controlled.  Repatha was discussed for management of hyperlipidemia.  She did not tolerate Lipitor in the past.  She wanted to consider before starting. CT calcium score was also discussed. She was to call back if she elected to proceed.   Seen 01/30/2023 by Francis Dowse, PA.  Device interrogation revealed normal function, no program changes were made. Low PVC burden on device.   Seen by me on 07/22/23 at which time BP was elevated. She was cleared to undergo breast surgery without further cardiac testing. Diltiazem was increased to 180 mg daily but she felt tired, shaky and had stomach discomfort so she went back to 120 mg daily. She was encouraged to undergo CT calcium score for risk stratification.   ED visit 08/17/23 with elevated BP and sodium 129. Seen in follow-up by Jacolyn Reedy, PA on 08/20/23. She continued to experience fatigue on lower dose of diltiazem. Hydrochlorothiazide was stopped due to hyponatremia and to again increase diltiazem to 180 mg daily. Na was 128 on that day and she was advised to return in 1 week after stopping hydrochlorothiazide. Na improved to 135 on 08/23/23. On October 4, she decreased  diltiazem back to 120 mg.        History of Present Illness: .   [name] is a very pleasant 77 y.o. female who is here for follow-up, accompanied by her husband. She reports ongoing tiredness and concerns about her heart rate and cholesterol levels. The patient reports feeling "washed out" most of the time, with episodes of feeling like her heart is racing. History of COVID-19 infection, after which she has been experiencing persistent tiredness. The patient's husband corroborates her symptoms and adds that she has not been very active due to her tiredness. The patient has been on diltiazem for blood pressure control, but has had to adjust the dosage due to side effects and fluctuations in her heart rate. She also reports a history of intolerance to various medications, including a previous trial of atorvastatin for cholesterol management. The patient has not yet received her annual flu shot due to her ongoing health concerns. She denies chest pain, shortness of breath, orthopnea, PND, edema, palpitations, presyncope and syncope.  She is concerned that this is a potential symptom of heart disease.  She has not been able to be active recently, therefore no indication that symptoms are worse with exertion. She has postponed breast surgery due to fatigue.   ROS: See HPI       Studies Reviewed: .         Risk Assessment/Calculations:             Physical Exam:   VS:  BP 138/80   Pulse 81   Ht 5\' 4"  (1.626 m)   Wt 143 lb (64.9  kg)   SpO2 94%   BMI 24.55 kg/m    Wt Readings from Last 3 Encounters:  09/10/23 143 lb (64.9 kg)  09/02/23 141 lb (64 kg)  08/20/23 142 lb (64.4 kg)    GEN: Well nourished, well developed in no acute distress NECK: No JVD; No carotid bruits CARDIAC: RRR, no murmurs, rubs, gallops RESPIRATORY:  Clear to auscultation without rales, wheezing or rhonchi  ABDOMEN: Soft, non-tender, non-distended EXTREMITIES:  No edema; No deformity     ASSESSMENT AND PLAN: .     Hyponatremia: Sodium level has improved to 135 on lab work completed 08/23/2023.  She has discontinued hydrochlorothiazide. She admits to drinking a lot of water. I encouraged her to continue to add electrolytes to her diet.  CAD: Coronary calcification of RCA, LAD noted on CT 2021. We have had no further imaging for risk stratification, she was planning to get CT calcium score. Concern that ongoing fatigue is symptom of angina. She denies chest pain or shortness of breath but has not been very active recently.  Given additional risk factors of hyperlipidemia and hypertension, we will get coronary CTA for ischemia evaluation.  Will have her take Lopressor 50 mg 2 hours prior to test. Advised her to take her diltiazem 120 mg 2 hours prior to Lopressor.  Hypertension: BP is mildly elevated. She experienced fatigue and increased heart rate with increased dose of Diltiazem 180mg . Hydrochlorothiazide discontinued secondary to hyponatremia.  She is sensitive to medication and has many drug allergies.  We will complete ischemia evaluation and continue continue Diltiazem 120mg  daily for now.  SSS s/p PPM implant: Normal device function on remote device check 07/31/23.  No acute concerns today. Continue routine monitoring. Management per EP.   Palpitations/Frequent PVCs: Occasional palpitations.  She has not been able to tolerate higher dose of CCB and has been intolerant of multiple beta-blockers. Continue diltiazem 120 mg daily.   Hyperlipidemia LDL goal < 70: LDL 148 September 2023.  Previous intolerance of atorvastatin.  She is concerned about starting statin therapy.  Lengthy discussion about risks of elevated LDL.  She is agreeable to start rosuvastatin 10 mg daily with plan to repeat lipid and ALT in 2 to 3 months.  Advised her to notify us if she does not tolerate this medication. Brief discussion about PCSK9i therapy if indicated in the future.   NSVT: No evidence of NSVT on recent device download. No  presyncope or syncope.  Continue diltiazem.       Dispo: TBD after coronary CTA/4 months with Dr. Nelly Laurence  Signed, Eligha Bridegroom, NP-C

## 2023-09-10 ENCOUNTER — Ambulatory Visit: Payer: Medicare Other | Attending: Nurse Practitioner | Admitting: Nurse Practitioner

## 2023-09-10 ENCOUNTER — Encounter: Payer: Self-pay | Admitting: Nurse Practitioner

## 2023-09-10 VITALS — BP 138/80 | HR 81 | Ht 64.0 in | Wt 143.0 lb

## 2023-09-10 DIAGNOSIS — I4729 Other ventricular tachycardia: Secondary | ICD-10-CM | POA: Diagnosis not present

## 2023-09-10 DIAGNOSIS — E785 Hyperlipidemia, unspecified: Secondary | ICD-10-CM

## 2023-09-10 DIAGNOSIS — I10 Essential (primary) hypertension: Secondary | ICD-10-CM | POA: Diagnosis not present

## 2023-09-10 DIAGNOSIS — Z79899 Other long term (current) drug therapy: Secondary | ICD-10-CM

## 2023-09-10 DIAGNOSIS — I25118 Atherosclerotic heart disease of native coronary artery with other forms of angina pectoris: Secondary | ICD-10-CM | POA: Diagnosis not present

## 2023-09-10 DIAGNOSIS — Z950 Presence of cardiac pacemaker: Secondary | ICD-10-CM | POA: Diagnosis not present

## 2023-09-10 DIAGNOSIS — E871 Hypo-osmolality and hyponatremia: Secondary | ICD-10-CM | POA: Diagnosis not present

## 2023-09-10 DIAGNOSIS — I2510 Atherosclerotic heart disease of native coronary artery without angina pectoris: Secondary | ICD-10-CM | POA: Diagnosis not present

## 2023-09-10 DIAGNOSIS — I495 Sick sinus syndrome: Secondary | ICD-10-CM | POA: Diagnosis not present

## 2023-09-10 DIAGNOSIS — R002 Palpitations: Secondary | ICD-10-CM

## 2023-09-10 MED ORDER — ROSUVASTATIN CALCIUM 10 MG PO TABS: 10.0000 mg | ORAL_TABLET | Freq: Every day | ORAL | 11 refills | Status: DC

## 2023-09-10 MED ORDER — METOPROLOL TARTRATE 50 MG PO TABS: ORAL_TABLET | ORAL | 0 refills | Status: DC

## 2023-09-10 NOTE — Patient Instructions (Signed)
Medication Instructions:   START Rosuvastatin one (1) tablet by mouth (10 mg) daily.   *If you need a refill on your cardiac medications before your next appointment, please call your pharmacy*   Lab Work:  TODAY!!!! BMET  Your physician recommends that you return for a FASTING lipid profile/ALT on Wednesday, December 18. You can come in on the day of your appointment anytime between 7:30 am -4:30 pm fasting from midnight the night before. Closed from 12:30-1:30 for lunch.    If you have labs (blood work) drawn today and your tests are completely normal, you will receive your results only by: MyChart Message (if you have MyChart) OR A paper copy in the mail If you have any lab test that is abnormal or we need to change your treatment, we will call you to review the results.   Testing/Procedures:     Your cardiac CT will be scheduled at one of the below locations:   [facility] [address] [place], [place] [address] [phone]   If scheduled at [facility], please arrive at the [facility] Entrance (Entrance C2) of [facility] 30 minutes prior to test start time. You can use the FREE valet parking offered at entrance C (encouraged to control the heart rate for the test)  Proceed to the [facility] Radiology Department (first floor) to check-in and test prep.  All radiology patients and guests should use entrance C2 at [facility], accessed from [facility], even though the hospital's physical address listed is [address]    There is spacious parking and easy access to the radiology department from the [facility] V[facility] entrance. Please enter here and check-in with the desk attendant.   Please follow these instructions carefully (unless otherwise directed):  An IV will be required for this test and Nitroglycerin will be given.  Hold all erectile dysfunction medications at least 3 days (72  hrs) prior to test. (Ie viagra, cialis, sildenafil, tadalafil, etc)   On the Night Before the Test: Be sure to Drink plenty of water. Do not consume any caffeinated/decaffeinated beverages or chocolate 12 hours prior to your test. Do not take any antihistamines 12 hours prior to your test.  On the Day of the Test: Drink plenty of water until 1 hour prior to the test. Do not eat any food 1 hour prior to test. You may take your regular medications prior to the test.  Take metoprolol (Lopressor) one (1) tablet by mouth ( 50 mg)  two hours prior to test. Take your Diltiazem 2 hours prior to Lopressor. FEMALES- please wear underwire-free bra if available, avoid dresses & tight clothing     After the Test: Drink plenty of water. After receiving IV contrast, you may experience a mild flushed feeling. This is normal. On occasion, you may experience a mild rash up to 24 hours after the test. This is not dangerous. If this occurs, you can take Benadryl 25 mg and increase your fluid intake. If you experience trouble breathing, this can be serious. If it is severe call 911 IMMEDIATELY. If it is mild, please call our office. We will call to schedule your test 2-4 weeks out understanding that some insurance companies will need an authorization prior to the service being performed.   For more information and frequently asked questions, please visit our website : [email or web address]  For non-scheduling related questions, please contact the cardiac imaging nurse navigator should you  have any questions/concerns: Cardiac Imaging Nurse Navigators Direct Office Dial: [phone]   For scheduling needs, including cancellations and rescheduling, please call [place], [phone].    Follow-Up: At [facility], you and your health needs are our priority.  As part of our continuing mission to provide you with exceptional heart care, we have created designated Provider Care Teams.   These Care Teams include your primary Cardiologist (physician) and Advanced Practice Providers (APPs -  Physician Assistants and Nurse Practitioners) who all work together to provide you with the care you need, when you need it.  We recommend signing up for the patient portal called "MyChart".  Sign up information is provided on this After Visit Summary.  MyChart is used to connect with patients for Virtual Visits (Telemedicine).  Patients are able to view lab/test results, encounter notes, upcoming appointments, etc.  Non-urgent messages can be sent to your provider as well.   To learn more about what you can do with MyChart, go to [email or web address].    Your next appointment:   4 month(s)  Provider:   York Pellant, MD

## 2023-09-11 LAB — BASIC METABOLIC PANEL
BUN/Creatinine Ratio: 20 (ref 12–28)
BUN: 17 mg/dL (ref 8–27)
CO2: 25 mmol/L (ref 20–29)
Calcium: 9.5 mg/dL (ref 8.7–10.3)
Chloride: 98 mmol/L (ref 96–106)
Creatinine, Ser: 0.83 mg/dL (ref 0.57–1.00)
Glucose: 83 mg/dL (ref 70–99)
Potassium: 4.4 mmol/L (ref 3.5–5.2)
Sodium: 136 mmol/L (ref 134–144)
eGFR: 73 mL/min/{1.73_m2} (ref >59)

## 2023-09-13 ENCOUNTER — Other Ambulatory Visit: Payer: Medicare Other

## 2023-09-13 DIAGNOSIS — I10 Essential (primary) hypertension: Secondary | ICD-10-CM | POA: Diagnosis not present

## 2023-09-13 DIAGNOSIS — E559 Vitamin D deficiency, unspecified: Secondary | ICD-10-CM | POA: Diagnosis not present

## 2023-09-13 LAB — BASIC METABOLIC PANEL
BUN: 15 mg/dL (ref 6–23)
CO2: 28 meq/L (ref 19–32)
Calcium: 9.5 mg/dL (ref 8.4–10.5)
Chloride: 97 meq/L (ref 96–112)
Creatinine, Ser: 0.84 mg/dL (ref 0.40–1.20)
GFR: 67.79 mL/min (ref >60.00)
Glucose, Bld: 105 mg/dL — ABNORMAL HIGH (ref 70–99)
Potassium: 4.1 meq/L (ref 3.5–5.1)
Sodium: 135 meq/L (ref 135–145)

## 2023-09-13 LAB — LIPID PANEL
Cholesterol: 214 mg/dL — ABNORMAL HIGH (ref 0–200)
HDL: 61.2 mg/dL (ref >39.00)
LDL Cholesterol: 132 mg/dL — ABNORMAL HIGH (ref 0–99)
NonHDL: 152.76
Total CHOL/HDL Ratio: 3
Triglycerides: 105 mg/dL (ref 0.0–149.0)
VLDL: 21 mg/dL (ref 0.0–40.0)

## 2023-09-13 LAB — VITAMIN D 25 HYDROXY (VIT D DEFICIENCY, FRACTURES): VITD: 39.93 ng/mL (ref 30.00–100.00)

## 2023-09-13 LAB — TSH: TSH: 2.62 u[IU]/mL (ref 0.35–5.50)

## 2023-09-16 ENCOUNTER — Telehealth: Payer: Self-pay

## 2023-09-16 NOTE — Telephone Encounter (Signed)
Transition Care Management Follow-up Telephone Call Date of discharge and from where: [facility] 9/14 How have you been since you were released from the hospital? Still having the same issues and following up with providers and under doctors care Any questions or concerns? No  Items Reviewed: Did the pt receive and understand the discharge instructions provided? Yes  Medications obtained and verified? No  Other? No  Any new allergies since your discharge? No  Dietary orders reviewed? No Do you have support at home? Yes     Follow up appointments reviewed:  PCP Hospital f/u appt confirmed? Yes  Scheduled to see  on  @ . Specialist Hospital f/u appt confirmed? Yes  Scheduled to see  on  @ . Are transportation arrangements needed? No  If their condition worsens, is the pt aware to call PCP or go to the Emergency Dept.? Yes Was the patient provided with contact information for the PCP's office or ED? Yes Was to pt encouraged to call back with questions or concerns? Yes

## 2023-09-20 ENCOUNTER — Ambulatory Visit (INDEPENDENT_AMBULATORY_CARE_PROVIDER_SITE_OTHER): Payer: Medicare Other | Admitting: Family Medicine

## 2023-09-20 ENCOUNTER — Encounter: Payer: Self-pay | Admitting: Family Medicine

## 2023-09-20 VITALS — BP 134/82 | HR 67 | Temp 98.8°F | Ht 63.25 in | Wt 144.0 lb

## 2023-09-20 DIAGNOSIS — F419 Anxiety disorder, unspecified: Secondary | ICD-10-CM

## 2023-09-20 DIAGNOSIS — E7800 Pure hypercholesterolemia, unspecified: Secondary | ICD-10-CM | POA: Diagnosis not present

## 2023-09-20 DIAGNOSIS — Z7189 Other specified counseling: Secondary | ICD-10-CM

## 2023-09-20 DIAGNOSIS — Z0000 Encounter for general adult medical examination without abnormal findings: Secondary | ICD-10-CM

## 2023-09-20 DIAGNOSIS — I10 Essential (primary) hypertension: Secondary | ICD-10-CM

## 2023-09-20 MED ORDER — ROSUVASTATIN CALCIUM 10 MG PO TABS: 5.0000 mg | ORAL_TABLET | Freq: Every day | ORAL | Status: AC

## 2023-09-20 NOTE — Progress Notes (Unsigned)
Hearing Screening - Comments:: Passed whisper test Vision Screening - Comments:: February 2024  

## 2023-09-20 NOTE — Patient Instructions (Signed)
Let me check with cardiology about the scan/getting scheduled.  Flu shot when possible, likely after the scan.   We need to see about options re: crestor after the scan, especially if that is contributing to fatigue or mood changes.   Take care.  Glad to see you.

## 2023-09-20 NOTE — Progress Notes (Unsigned)
I have personally reviewed the Medicare Annual Wellness questionnaire and have noted 1. The patient's medical and social history 2. Their use of alcohol, tobacco or illicit drugs 3. Their current medications and supplements 4. The patient's functional ability including ADL's, fall risks, home safety risks and hearing or visual             impairment. 5. Diet and physical activities 6. Evidence for depression or mood disorders  The patients weight, height, BMI have been recorded in the chart and visual acuity is per eye clinic.  I have made referrals, counseling and provided education to the patient based review of the above and I have provided the pt with a written personalized care plan for preventive services.  Provider list updated- see scanned forms.  Routine anticipatory guidance given to patient.  See health maintenance. The possibility exists that previously documented standard health maintenance information may have been brought forward from a previous encounter into this note.  If needed, that same information has been updated to reflect the current situation based on today's encounter.    Flu Shingles PNA Tetanus Colon  Breast cancer screening Prostate cancer screening Advance directive Cognitive function addressed- see scanned forms- and if abnormal then additional documentation follows.   In addition to [facility] Wellness, follow up visit for the below conditions:  Mood d/w pt.  Mood is lower.  D/w pt about statin use.  Still on BZD at baseline.  Longstanding h/o anxiety and recent illnesses have been upsetting.  No SI/HI.    Elevated Cholesterol: Unclear if fatigue is related to statin use.  Coronary CT pending.  D/w pt about continuing statin for now.   H/o vit D def, normal on most recent check .  HTN.  Na normalized off hydrochlorothiazide.  BP controlled.    PMH and SH reviewed  Meds, vitals, and allergies reviewed.   ROS: Per HPI.  Unless specifically  indicated otherwise in HPI, the patient denies:  General: fever. Eyes: acute vision changes ENT: sore throat Cardiovascular: chest pain Respiratory: SOB GI: vomiting GU: dysuria Musculoskeletal: acute back pain Derm: acute rash Neuro: acute motor dysfunction Psych: worsening mood Endocrine: polydipsia Heme: bleeding Allergy: hayfever  GEN: nad, alert and oriented HEENT: mucous membranes moist NECK: supple w/o LA CV: rrr. PULM: ctab, no inc wob ABD: soft, +bs EXT: no edema SKIN: no acute rash

## 2023-09-22 NOTE — Assessment & Plan Note (Signed)
Unclear if fatigue is related to statin use.  Coronary CT pending.  D/w pt about continuing statin for now.

## 2023-09-22 NOTE — Assessment & Plan Note (Signed)
Flu deferred until after she has her CT, discussed with patient Shingles discussed with patient PNA previously done Tetanus 2019 Covid and RSV vaccine discussed with patient. Colonoscopy 2021 Breast cancer screening 2024 Bone density test deferred 2024. Advance directive-husband designated if patient were incapacitated. Cognitive function addressed- see scanned forms- and if abnormal then additional documentation follows.

## 2023-09-22 NOTE — Assessment & Plan Note (Signed)
Na normalized off hydrochlorothiazide.  BP controlled.  Continue diltiazem for now.

## 2023-09-22 NOTE — Assessment & Plan Note (Signed)
Mood is lower.  D/w pt about statin use.  Still on BZD at baseline.  Longstanding h/o anxiety and recent illnesses have been upsetting.  No SI/HI.    Unclear if statin use contributes to fatigue and if that contributes to her mood.  Discussed continuing statin at least until she can have her cardiac CT done.  Continue lorazepam as needed in the meantime.

## 2023-09-22 NOTE — Assessment & Plan Note (Signed)
Advance directive- husband designated if patient were incapacitated.  

## 2023-09-24 ENCOUNTER — Encounter: Payer: Self-pay | Admitting: Family Medicine

## 2023-09-25 DIAGNOSIS — Z961 Presence of intraocular lens: Secondary | ICD-10-CM | POA: Diagnosis not present

## 2023-09-25 DIAGNOSIS — H2512 Age-related nuclear cataract, left eye: Secondary | ICD-10-CM | POA: Diagnosis not present

## 2023-09-25 DIAGNOSIS — H524 Presbyopia: Secondary | ICD-10-CM | POA: Diagnosis not present

## 2023-09-26 ENCOUNTER — Ambulatory Visit (INDEPENDENT_AMBULATORY_CARE_PROVIDER_SITE_OTHER): Payer: Medicare Other

## 2023-09-26 DIAGNOSIS — Z23 Encounter for immunization: Secondary | ICD-10-CM | POA: Diagnosis not present

## 2023-10-01 ENCOUNTER — Other Ambulatory Visit: Payer: Self-pay | Admitting: Family Medicine

## 2023-10-07 ENCOUNTER — Telehealth (HOSPITAL_COMMUNITY): Payer: Self-pay | Admitting: *Deleted

## 2023-10-07 NOTE — Telephone Encounter (Signed)
Reaching out to patient to offer assistance regarding upcoming cardiac imaging study; pt verbalizes understanding of appt date/time, parking situation and where to check in, pre-test NPO status and medications ordered, and verified current allergies; name and call back number provided for further questions should they arise  Larey Brick RN Navigator Cardiac Imaging [facility] [phone] office [phone] cell  Patient to take 25mg  metoprolol tartrate two hours prior to her cardiac CT scan. She is aware that she will need a driver if she decides to take her ativan and to arrive at 2:30 PM.

## 2023-10-08 ENCOUNTER — Other Ambulatory Visit: Payer: Self-pay | Admitting: Cardiology

## 2023-10-08 ENCOUNTER — Ambulatory Visit (HOSPITAL_COMMUNITY)
Admission: RE | Admit: 2023-10-08 | Discharge: 2023-10-08 | Disposition: A | Discharge status: Home / Self Care | Payer: Medicare Other | Source: Ambulatory Visit | Attending: Nurse Practitioner | Admitting: Nurse Practitioner

## 2023-10-08 ENCOUNTER — Ambulatory Visit (HOSPITAL_COMMUNITY)
Admission: RE | Admit: 2023-10-08 | Discharge: 2023-10-08 | Disposition: A | Discharge status: Home / Self Care | Payer: Medicare Other | Source: Ambulatory Visit | Attending: Cardiology | Admitting: Cardiology

## 2023-10-08 DIAGNOSIS — R931 Abnormal findings on diagnostic imaging of heart and coronary circulation: Secondary | ICD-10-CM | POA: Diagnosis not present

## 2023-10-08 DIAGNOSIS — Z79899 Other long term (current) drug therapy: Secondary | ICD-10-CM | POA: Diagnosis not present

## 2023-10-08 DIAGNOSIS — I10 Essential (primary) hypertension: Secondary | ICD-10-CM | POA: Insufficient documentation

## 2023-10-08 DIAGNOSIS — I25118 Atherosclerotic heart disease of native coronary artery with other forms of angina pectoris: Secondary | ICD-10-CM | POA: Insufficient documentation

## 2023-10-08 DIAGNOSIS — I2510 Atherosclerotic heart disease of native coronary artery without angina pectoris: Secondary | ICD-10-CM | POA: Diagnosis not present

## 2023-10-08 DIAGNOSIS — I4729 Other ventricular tachycardia: Secondary | ICD-10-CM | POA: Diagnosis not present

## 2023-10-08 MED ORDER — NITROGLYCERIN 0.4 MG SL SUBL
0.8000 mg | SUBLINGUAL_TABLET | Freq: Once | SUBLINGUAL | Status: AC
  Administered 2023-10-08: 0.8 mg via SUBLINGUAL

## 2023-10-08 MED ORDER — IOHEXOL 350 MG/ML SOLN
95.0000 mL | Freq: Once | INTRAVENOUS | Status: AC | PRN
  Administered 2023-10-08: 95 mL via INTRAVENOUS

## 2023-10-08 MED ORDER — NITROGLYCERIN 0.4 MG SL SUBL
SUBLINGUAL_TABLET | SUBLINGUAL | Status: AC
  Filled 2023-10-08: qty 2

## 2023-10-09 DIAGNOSIS — R931 Abnormal findings on diagnostic imaging of heart and coronary circulation: Secondary | ICD-10-CM

## 2023-10-14 DIAGNOSIS — H34811 Central retinal vein occlusion, right eye, with macular edema: Secondary | ICD-10-CM | POA: Diagnosis not present

## 2023-10-17 ENCOUNTER — Encounter: Payer: Self-pay | Admitting: Family Medicine

## 2023-10-17 NOTE — Telephone Encounter (Signed)
error 

## 2023-10-22 ENCOUNTER — Ambulatory Visit (INDEPENDENT_AMBULATORY_CARE_PROVIDER_SITE_OTHER): Payer: Medicare Other | Admitting: Family Medicine

## 2023-10-22 ENCOUNTER — Encounter: Payer: Self-pay | Admitting: Family Medicine

## 2023-10-22 VITALS — BP 144/84 | HR 76 | Temp 98.9°F | Ht 63.0 in | Wt 144.6 lb

## 2023-10-22 DIAGNOSIS — R5383 Other fatigue: Secondary | ICD-10-CM | POA: Diagnosis not present

## 2023-10-22 MED ORDER — DILTIAZEM HCL ER COATED BEADS 120 MG PO CP24: 120.0000 mg | ORAL_CAPSULE | Freq: Every day | ORAL | Status: DC

## 2023-10-22 NOTE — Progress Notes (Unsigned)
Longstanding fatigue, for months, since covid.    She has some sleep disruption after 4 hours of sleep at night.  She couldn't tolerate CPAP prev.  She had used mouthguard and that helped but then had dental work.  Hasn't used mouthguard recently, d/w pt.   Mood d/w pt, ie d/w pt about depressive sx contributing or resulting from situation, or both.    CAD with FFR that isn't low.  Started on crestor in the meantime.  D/w pt about rationale for use.    She has fatigue,  was exhausted after going to church recently.  She has been on crestor 5mg  daily since 10/09/23.    She tends to be fatigued in the AMs, after getting breakfast.    Meds, vitals, and allergies reviewed.   ROS: Per HPI unless specifically indicated in ROS section   GEN: nad, alert and oriented HEENT: ncat NECK: supple w/o LA CV: rrr.  PULM: ctab, no inc wob ABD: soft, +bs EXT: no edema SKIN: Well-perfused.

## 2023-10-22 NOTE — Patient Instructions (Signed)
I would stop crestor for 10-14 days and see if you feel any different.   Please call about the mouthguard and let me know if you need another sleep study.  Take care.  Glad to see you.

## 2023-10-23 NOTE — Assessment & Plan Note (Signed)
Unclear source.  Unclear if this is related to post-COVID symptoms.  DDx d/w pt, mood d/w pt.  Discussed statin indication use. I would stop crestor for 10-14 days and she can see if she feels any better.  Either way she can let me know. Discussed sleep hygiene and potential sleep apnea/mouthguard.  I asked her to please call about the mouthguard and let me know if she is needing another sleep study after that phone call. 39 minutes were devoted to patient care in this encounter (this includes time spent reviewing the patient's file/history, interviewing and examining the patient, counseling/reviewing plan with patient).

## 2023-10-29 ENCOUNTER — Encounter: Payer: Self-pay | Admitting: Family Medicine

## 2023-10-30 ENCOUNTER — Ambulatory Visit (INDEPENDENT_AMBULATORY_CARE_PROVIDER_SITE_OTHER): Payer: Medicare Other

## 2023-10-30 DIAGNOSIS — I495 Sick sinus syndrome: Secondary | ICD-10-CM | POA: Diagnosis not present

## 2023-10-30 LAB — CUP PACEART REMOTE DEVICE CHECK
Battery Impedance: 941 Ohm
Battery Remaining Longevity: 72 mo
Battery Voltage: 2.77 V
Brady Statistic AP VP Percent: 0 %
Brady Statistic AP VS Percent: 45 %
Brady Statistic AS VP Percent: 0 %
Brady Statistic AS VS Percent: 54 %
Date Time Interrogation Session: 20241127090056
Implantable Lead Connection Status: 753985
Implantable Lead Connection Status: 753985
Implantable Lead Implant Date: 20060620
Implantable Lead Implant Date: 20060620
Implantable Lead Location: 753859
Implantable Lead Location: 753860
Implantable Lead Model: 5076
Implantable Lead Model: 5076
Implantable Pulse Generator Implant Date: 20160219
Lead Channel Impedance Value: 665 Ohm
Lead Channel Impedance Value: 890 Ohm
Lead Channel Pacing Threshold Amplitude: 0.625 V
Lead Channel Pacing Threshold Amplitude: 0.75 V
Lead Channel Pacing Threshold Pulse Width: 0.4 ms
Lead Channel Pacing Threshold Pulse Width: 0.4 ms
Lead Channel Setting Pacing Amplitude: 2 V
Lead Channel Setting Pacing Amplitude: 2.5 V
Lead Channel Setting Pacing Pulse Width: 0.4 ms
Lead Channel Setting Sensing Sensitivity: 5.6 mV
Zone Setting Status: 755011
Zone Setting Status: 755011

## 2023-11-04 ENCOUNTER — Encounter: Payer: Self-pay | Admitting: Family Medicine

## 2023-11-04 ENCOUNTER — Other Ambulatory Visit: Payer: Self-pay | Admitting: Family Medicine

## 2023-11-05 ENCOUNTER — Ambulatory Visit (HOSPITAL_COMMUNITY): Payer: Medicare Other | Attending: Nurse Practitioner

## 2023-11-05 DIAGNOSIS — I361 Nonrheumatic tricuspid (valve) insufficiency: Secondary | ICD-10-CM

## 2023-11-05 DIAGNOSIS — I5030 Unspecified diastolic (congestive) heart failure: Secondary | ICD-10-CM | POA: Diagnosis not present

## 2023-11-05 DIAGNOSIS — R931 Abnormal findings on diagnostic imaging of heart and coronary circulation: Secondary | ICD-10-CM | POA: Diagnosis not present

## 2023-11-05 DIAGNOSIS — I77810 Thoracic aortic ectasia: Secondary | ICD-10-CM | POA: Diagnosis not present

## 2023-11-05 DIAGNOSIS — I517 Cardiomegaly: Secondary | ICD-10-CM | POA: Diagnosis not present

## 2023-11-05 LAB — ECHOCARDIOGRAM COMPLETE
Area-P 1/2: 2.24 cm2
S' Lateral: 2.5 cm

## 2023-11-05 NOTE — Telephone Encounter (Signed)
Last office visit: 10/22/23 Next office visit: nothing scheduled Last refill:LORazepam 1 MG Oral Tablet 08/09/23 120 tablets 2 refills

## 2023-11-06 ENCOUNTER — Encounter: Payer: Self-pay | Admitting: Cardiovascular Disease

## 2023-11-06 NOTE — Telephone Encounter (Signed)
Patient husband called in follow up on this refill request. He stated that she will be out within the next two days.

## 2023-11-06 NOTE — Telephone Encounter (Signed)
Sent. Thanks.   

## 2023-11-08 DIAGNOSIS — H01002 Unspecified blepharitis right lower eyelid: Secondary | ICD-10-CM | POA: Diagnosis not present

## 2023-11-08 DIAGNOSIS — H00012 Hordeolum externum right lower eyelid: Secondary | ICD-10-CM | POA: Diagnosis not present

## 2023-11-13 ENCOUNTER — Encounter: Payer: Self-pay | Admitting: Cardiovascular Disease

## 2023-11-14 DIAGNOSIS — H00011 Hordeolum externum right upper eyelid: Secondary | ICD-10-CM | POA: Diagnosis not present

## 2023-11-14 DIAGNOSIS — H00012 Hordeolum externum right lower eyelid: Secondary | ICD-10-CM | POA: Diagnosis not present

## 2023-11-15 NOTE — Telephone Encounter (Signed)
Spoke with patient, educated on NSVT and of no clinical concern on her monitor. No further needs at this time

## 2023-11-18 ENCOUNTER — Ambulatory Visit (INDEPENDENT_AMBULATORY_CARE_PROVIDER_SITE_OTHER): Payer: Medicare Other | Admitting: Family Medicine

## 2023-11-18 ENCOUNTER — Encounter: Payer: Self-pay | Admitting: Family Medicine

## 2023-11-18 VITALS — BP 148/84 | HR 65 | Temp 98.8°F | Ht 63.0 in | Wt 145.0 lb

## 2023-11-18 DIAGNOSIS — R5383 Other fatigue: Secondary | ICD-10-CM

## 2023-11-18 NOTE — Patient Instructions (Signed)
I would get the sleep apnea/mouthguard testing done.  I'll await that report.  Take care.  Glad to see you.

## 2023-11-18 NOTE — Progress Notes (Unsigned)
Stye R lower eyelid, had eye clinic eval.  Then had R upper eyelid irritation.  Using warm compresses and starting to improve.    She stopped statin for 2 weeks.  No change in sx.  Restarted in the meantime.  Still fatigued.  No fevers. No chills.    She has appointment soon re: mouthguard- this week.  She is still snoring.  D/w pt about OSA eval and possible contribution.    Meds, vitals, and allergies reviewed.   ROS: Per HPI unless specifically indicated in ROS section   GEN: nad, alert and oriented HEENT: ncat, irritation on right upper and right lower eyelid noted. NECK: supple w/o LA CV: rrr. PULM: ctab, no inc wob ABD: soft, +bs EXT: no edema SKIN: well perfused.

## 2023-11-20 ENCOUNTER — Ambulatory Visit: Payer: Medicare Other

## 2023-11-20 DIAGNOSIS — Z79899 Other long term (current) drug therapy: Secondary | ICD-10-CM

## 2023-11-20 DIAGNOSIS — I4729 Other ventricular tachycardia: Secondary | ICD-10-CM

## 2023-11-20 DIAGNOSIS — I25118 Atherosclerotic heart disease of native coronary artery with other forms of angina pectoris: Secondary | ICD-10-CM

## 2023-11-20 DIAGNOSIS — I10 Essential (primary) hypertension: Secondary | ICD-10-CM

## 2023-11-21 NOTE — Assessment & Plan Note (Signed)
I think it makes sense to get her evaluation done for sleep apnea/possible mouthguard use and go from there.  That could contribute to her fatigue.  Discussed.  Detailed conversation with patient.  32 minutes were devoted to patient care in this encounter (this includes time spent reviewing the patient's file/history, interviewing and examining the patient, counseling/reviewing plan with patient).

## 2023-12-05 ENCOUNTER — Other Ambulatory Visit (HOSPITAL_COMMUNITY): Payer: Medicare Other

## 2023-12-05 DIAGNOSIS — I4729 Other ventricular tachycardia: Secondary | ICD-10-CM | POA: Diagnosis not present

## 2023-12-05 DIAGNOSIS — I25118 Atherosclerotic heart disease of native coronary artery with other forms of angina pectoris: Secondary | ICD-10-CM | POA: Diagnosis not present

## 2023-12-05 DIAGNOSIS — Z79899 Other long term (current) drug therapy: Secondary | ICD-10-CM | POA: Diagnosis not present

## 2023-12-05 DIAGNOSIS — I10 Essential (primary) hypertension: Secondary | ICD-10-CM | POA: Diagnosis not present

## 2023-12-05 LAB — LIPID PANEL
Chol/HDL Ratio: 2.4 {ratio} (ref 0.0–4.4)
Cholesterol, Total: 157 mg/dL (ref 100–199)
HDL: 65 mg/dL (ref >39)
LDL Chol Calc (NIH): 76 mg/dL (ref 0–99)
Triglycerides: 87 mg/dL (ref 0–149)
VLDL Cholesterol Cal: 16 mg/dL (ref 5–40)

## 2023-12-05 LAB — ALT: ALT: 14 [IU]/L (ref 0–32)

## 2023-12-11 DIAGNOSIS — H34811 Central retinal vein occlusion, right eye, with macular edema: Secondary | ICD-10-CM | POA: Diagnosis not present

## 2023-12-17 ENCOUNTER — Telehealth: Payer: Self-pay | Admitting: Radiology

## 2023-12-17 ENCOUNTER — Ambulatory Visit: Payer: Medicare Other | Attending: Cardiovascular Disease | Admitting: Cardiovascular Disease

## 2023-12-17 ENCOUNTER — Encounter: Payer: Self-pay | Admitting: Cardiovascular Disease

## 2023-12-17 VITALS — BP 124/86 | HR 72 | Ht 63.0 in | Wt 144.2 lb

## 2023-12-17 DIAGNOSIS — I493 Ventricular premature depolarization: Secondary | ICD-10-CM | POA: Diagnosis not present

## 2023-12-17 DIAGNOSIS — I10 Essential (primary) hypertension: Secondary | ICD-10-CM | POA: Diagnosis not present

## 2023-12-17 DIAGNOSIS — R5383 Other fatigue: Secondary | ICD-10-CM

## 2023-12-17 DIAGNOSIS — I2510 Atherosclerotic heart disease of native coronary artery without angina pectoris: Secondary | ICD-10-CM | POA: Diagnosis not present

## 2023-12-17 DIAGNOSIS — I712 Thoracic aortic aneurysm, without rupture, unspecified: Secondary | ICD-10-CM

## 2023-12-17 DIAGNOSIS — G4733 Obstructive sleep apnea (adult) (pediatric): Secondary | ICD-10-CM | POA: Diagnosis not present

## 2023-12-17 DIAGNOSIS — I495 Sick sinus syndrome: Secondary | ICD-10-CM | POA: Diagnosis not present

## 2023-12-17 NOTE — Progress Notes (Signed)
 Chief Complaint  Patient presents with   Follow-up    CAD   History of Present Illness: 78 yo female with history of sleep apnea, HLD, HTN, GERD/hiatal hernia/esopagheal stricture, sick sinus syndrome s/p pacemaker, PACs, PVCs who is here today for cardiac follow up. She had been followed by Dr. Dominick for general cardiology issues. Her pacemaker was changed in 2016 due to end of battery life. Echo June 2017 with normal LV function, no valve disease. She is known to have PACs and PVCs.  She had a spontaneous subdural hematoma in October 2018 that did not require surgical intervention. Echo May 2021 with LVEF=60-65%. No valve disease. She was seen in our office in October 2024 and reported ongoing fatigue. Coronary CTA November 2024 with mild disease in the RCA and LAD that was not flow limiting, normal CT FFR. Echo 11/05/23 with LVEF=65-70%. Normal RV function. No valve disease. 4.1 cm thoracic aortic aneurysm  She is here today for follow up. The patient denies any chest pain, dyspnea, palpitations, lower extremity edema, orthopnea, PND, dizziness, near syncope or syncope. She continues to have fatigue.    Primary Care Physician: Cleatus Arlyss RAMAN, MD  Past Medical History:  Diagnosis Date   Adenomatous colon polyp 12/2006   Allergic rhinitis    Anemia    Anxiety    Appendicitis 04/2020   Arthritis    Bradycardia    s/p MDT PPM   Cataract    bilateral   Depression    Esophageal stricture    Family history of adverse reaction to anesthesia    maternal uncle with MH, nephew had MH during surgery and was OK, pt not been tested   GERD (gastroesophageal reflux disease)    Hemorrhoids    Hyperlipidemia    Hypertension    Inguinal hernia    Osteoporosis    Presence of permanent cardiac pacemaker 2006   SSS   PVC (premature ventricular contraction)    Scleritis of right eye    Skin cancer    Basal and squamous cell   Sleep apnea    a. intolerant of CPAP, does wear a mouthpiece    Subdural hematoma (HCC)    a. spontanous 09/2017 b. resolved without intervention   Thyroid  disease     Past Surgical History:  Procedure Laterality Date   ABDOMINAL HYSTERECTOMY     APPENDECTOMY     BREAST BIOPSY Left 01/15/2014   BREAST BIOPSY Right 06/25/2023   US  RT BREAST BX W LOC DEV 1ST LESION IMG BX SPEC US  GUIDE 06/25/2023 [facility]   BREAST SURGERY     Biopsy-benign   CESAREAN SECTION     COLONOSCOPY  2018   DILATION AND CURETTAGE OF UTERUS     ESOPHAGOGASTRODUODENOSCOPY     esophageal stretching   HAND SURGERY  1992   HERNIA REPAIR     INGUINAL HERNIA REPAIR  02/12/1994   left inguinal hernia repair   KNEE ARTHROSCOPY  2011   LAPAROSCOPIC APPENDECTOMY N/A 11/29/2020   Procedure: APPENDECTOMY LAPAROSCOPIC;  Surgeon: Paola Dreama SAILOR, MD;  Location: [facility];  Service: General;  Laterality: N/A;   OOPHORECTOMY  1995   LSO and RSO   PACEMAKER GENERATOR CHANGE N/A 01/21/2015   Procedure: PACEMAKER GENERATOR CHANGE;  Surgeon: Lynwood Rakers, MD;  Location: [facility] CATH LAB;  Service: Cardiovascular;  Laterality: N/A;   PACEMAKER INSERTION  2006   Dr. Ellin   POLYPECTOMY     Tear duct surgery  2010  THUMB SURGERY     THYROIDECTOMY  1979   RIGHT SIDE   TOTAL KNEE ARTHROPLASTY Right 12/20/2014   Procedure: RIGHT TOTAL KNEE ARTHROPLASTY;  Surgeon: Marcey Raman, MD;  Location: [facility] OR;  Service: Orthopedics;  Laterality: Right;   UPPER GASTROINTESTINAL ENDOSCOPY     unsure    Current Outpatient Medications  Medication Sig Dispense Refill   acetaminophen  (TYLENOL ) 500 MG tablet Take 1,000 mg by mouth every 6 (six) hours as needed (pain).     diltiazem  (CARDIZEM  CD) 120 MG 24 hr capsule Take 1 capsule (120 mg total) by mouth daily.     famotidine  (PEPCID ) 20 MG tablet Take 1 tablet by mouth twice daily 180 tablet 0   LORazepam  (ATIVAN ) 1 MG tablet TAKE 1 TABLET BY MOUTH EVERY 6 HOURS AS NEEDED 120 tablet 2   Polyethyl Glycol-Propyl Glycol (SYSTANE  OP) Place 1 drop into both eyes daily as needed (dry eyes).      rosuvastatin  (CRESTOR ) 10 MG tablet Take 0.5 tablets (5 mg total) by mouth daily.     terconazole  (TERAZOL 7 ) 0.4 % vaginal cream Place 1 applicator vaginally at bedtime as needed. 45 g 3   No current facility-administered medications for this visit.    Allergies  Allergen Reactions   Hydrocodone     Increased HR   Antihistamines, Loratadine-Type Other (See Comments)    Heart races/jittery   Bisoprolol-Hydrochlorothiazide  Other (See Comments)    Couldn't tolerate UNKNOWN REACTION   Cephalexin Nausea And Vomiting   Codeine Other (See Comments)    Increased heart rate, anxiety   Demerol [Meperidine] Other (See Comments)    Increased heart rate, anxiety   Epinephrine  Other (See Comments)    Increased heart rate   Erythromycin Nausea And Vomiting   Hydrochlorothiazide      Low sodium   Hyoscyamine  Sulfate Other (See Comments)    Pt could not tolerate this med UNKNOWN reaction   Inderal [Propranolol] Other (See Comments)    Extreme tiredness   Lidocaine  Other (See Comments)    Family hx of malignant hyperthermia   Lipitor [Atorvastatin] Other (See Comments)    Muscle aches   Losartan Potassium Other (See Comments)    Pt could not tolerate this med UNKNOWN REACTION   Morphine Other (See Comments)    Nightmares, increased BP   Nebivolol Other (See Comments)    angioedema   Paxil [Paroxetine Hcl] Other (See Comments)    drove her up the wall   Penicillins Nausea And Vomiting   Prednisone Other (See Comments)    Tolerates low doses if absolutely necessary - makes her nervous, jittery   Propranolol Hcl Other (See Comments)    Pt could not tolerate this med UNKNOWN REACTION   Sertraline Hcl Other (See Comments)    Pt could not tolerate this med UNKNOWN REACTION   Tetracycline Nausea And Vomiting   Toprol  Xl [Metoprolol  Succinate] Other (See Comments)    Extreme tiredness   Verapamil Other (See Comments)     Pt could not tolerate this med UNKNOWN REACTION   Hydralazine      headaches   Oxycodone -Acetaminophen  Palpitations    Causes accelerated heart rate    Social History   Socioeconomic History   Marital status: Married    Spouse name: Riva   Number of children: 1   Years of education: Not on file   Highest education level: 12th grade  Occupational History   Occupation: Retired    Associate Professor: UNEMPLOYED  Tobacco Use  Smoking status: Never   Smokeless tobacco: Never  Vaping Use   Vaping status: Never Used  Substance and Sexual Activity   Alcohol  use: No    Alcohol /week: 0.0 standard drinks of alcohol    Drug use: No   Sexual activity: Not Currently    Birth control/protection: Surgical, Post-menopausal    Comment: HYST-1st intercourse 78 yo-fewer than 5 partners  Other Topics Concern   Not on file  Social History Narrative   Education:  12th grade   Married 1971   1 son in [place] , 4 grandkids   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   Social Drivers of Corporate Investment Banker Strain: Low Risk  (11/15/2023)   Overall Financial Resource Strain (CARDIA)    Difficulty of Paying Living Expenses: Not hard at all  Food Insecurity: No Food Insecurity (11/15/2023)   Hunger Vital Sign    Worried About Running Out of Food in the Last Year: Never true    Ran Out of Food in the Last Year: Never true  Transportation Needs: No Transportation Needs (11/15/2023)   PRAPARE - Administrator, Civil Service (Medical): No    Lack of Transportation (Non-Medical): No  Physical Activity: Unknown (11/15/2023)   Exercise Vital Sign    Days of Exercise per Week: 0 days    Minutes of Exercise per Session: Not on file  Stress: Stress Concern Present (11/15/2023)   [employer] of Occupational Health - Occupational Stress Questionnaire    Feeling of Stress : To some extent  Social Connections: Socially Integrated (11/15/2023)   Social Connection and Isolation Panel [NHANES]     Frequency of Communication with Friends and Family: More than three times a week    Frequency of Social Gatherings with Friends and Family: Once a week    Attends Religious Services: More than 4 times per year    Active Member of [employer] or Organizations: Yes    Attends Engineer, Structural: More than 4 times per year    Marital Status: Married  Catering Manager Violence: Not At Risk (07/15/2020)   Humiliation, Afraid, Rape, and Kick questionnaire    Fear of Current or Ex-Partner: No    Emotionally Abused: No    Physically Abused: No    Sexually Abused: No    Family History  Problem Relation Age of Onset   Heart failure Sister    Heart disease Sister    Hypertension Sister    Colon polyps Sister    Dementia Mother    Hypertension Mother    Colon polyps Mother    Hypertension Father    Arthritis Father    Breast cancer Maternal Aunt        Age 64's   Diabetes Maternal Aunt    Diabetes Maternal Uncle    Ovarian cancer Maternal Aunt    Colon cancer Neg Hx    Esophageal cancer Neg Hx    Stomach cancer Neg Hx    Rectal cancer Neg Hx     Review of Systems:  As stated in the HPI and otherwise negative.   BP 124/86   Pulse 72   Ht 5' 3 (1.6 m)   Wt 65.4 kg   SpO2 96%   BMI 25.54 kg/m   Physical Examination: General: Well developed, well nourished, NAD  HEENT: OP clear, mucus membranes moist  SKIN: warm, dry. No rashes. Neuro: No focal deficits  Musculoskeletal: Muscle strength 5/5 all ext  Psychiatric: Mood and affect normal  Neck: No JVD, no carotid bruits, no thyromegaly, no lymphadenopathy.  Lungs:Clear bilaterally, no wheezes, rhonci, crackles Cardiovascular: Regular rate and rhythm. No murmurs, gallops or rubs. Abdomen:Soft. Bowel sounds present. Non-tender.  Extremities: No lower extremity edema. Pulses are 2 + in the bilateral DP/PT.    EKG:  EKG is not ordered today. The ekg ordered today demonstrates   Recent Labs: 08/17/2023: Hemoglobin 13.7;  Platelets 214 09/02/2023: Magnesium 2.2 09/13/2023: BUN 15; Creatinine, Ser 0.84; Potassium 4.1; Sodium 135; TSH 2.62 12/05/2023: ALT 14   Lipid Panel    Component Value Date/Time   CHOL 157 12/05/2023 1011   TRIG 87 12/05/2023 1011   HDL 65 12/05/2023 1011   CHOLHDL 2.4 12/05/2023 1011   CHOLHDL 3 09/13/2023 1025   VLDL 21.0 09/13/2023 1025   LDLCALC 76 12/05/2023 1011   LDLDIRECT 146.6 01/25/2014 0953     Wt Readings from Last 3 Encounters:  12/17/23 65.4 kg  11/18/23 65.8 kg  10/22/23 65.6 kg    Assessment and Plan:   1. HTN: BP is well controlled. No changes today. Hydrochlorothiazide  discontinued secondary to hyponatremia.   2. HLD: Followed in primary care. She has not tolerated Lipitor in the past. She is tolerating Crestor  5 mg daily. LDL near goal in January 2025.   3. Symptomatic bradycardia: Her pacemaker is followed in the pacemaker clinic.    4. Palpitations/PVCs/PACs: No palpitations. Continue Cardizem .   5. Thoracic aortic aneurysm: 4.1 cm by CT in November 2024. Repeat chest CTA in November 2025.   6. CAD without angina: mild CAD by coronary CTA in November 2024. Continue statin. Will have her start an ASA.   Labs/ tests ordered today include:   Orders Placed This Encounter  Procedures   CT ANGIO CHEST AORTA W/CM & OR WO/CM   Disposition:   F/U with me in 12  months  Signed, Lonni Cash, MD 12/17/2023 4:08 PM    [facility] [address] [place], [place], [place]  [address] Phone: [phone]; Fax: [phone]

## 2023-12-17 NOTE — Addendum Note (Signed)
 Addended by: Lendon Ka on: 12/17/2023 04:21 PM   Modules accepted: Orders

## 2023-12-17 NOTE — Telephone Encounter (Signed)
 1. Patient agreement reviewed and signed on 12/17/23. 2. WatchPAT issued to patient on 08/14/2023 by Rockie RAMAN, CCT. Patient aware to not open the WatchPAT box until contacted with the activation PIN. 3. Patient profile initialized in CloudPAT on  4. Device serial number: [id number]

## 2023-12-17 NOTE — Patient Instructions (Signed)
 Medication Instructions:  No changes *If you need a refill on your cardiac medications before your next appointment, please call your pharmacy*   Lab Work: none   Testing/Procedures: CT chest aorta - November 2025   Follow-Up: At [facility], you and your health needs are our priority.  As part of our continuing mission to provide you with exceptional heart care, we have created designated Provider Care Teams.  These Care Teams include your primary Cardiologist (physician) and Advanced Practice Providers (APPs -  Physician Assistants and Nurse Practitioners) who all work together to provide you with the care you need, when you need it.   Your next appointment:   12 month(s)  Provider:   Lonni Cash, MD       1st Floor: - Lobby - Registration  - Pharmacy  - Lab - Cafe  2nd Floor: - PV Lab - Diagnostic Testing (echo, CT, nuclear med)  3rd Floor: - Vacant  4th Floor: - TCTS (cardiothoracic surgery) - AFib Clinic - Structural Heart Clinic - Vascular Surgery  - Vascular Ultrasound  5th Floor: - HeartCare Cardiology (general and EP) - Clinical Pharmacy for coumadin, hypertension, lipid, weight-loss medications, and med management appointments    Valet parking services will be available as well.

## 2023-12-24 ENCOUNTER — Encounter: Payer: Self-pay | Admitting: Cardiovascular Disease

## 2023-12-26 ENCOUNTER — Other Ambulatory Visit: Payer: Self-pay | Admitting: Family Medicine

## 2023-12-26 NOTE — Telephone Encounter (Signed)
Ordering provider: Dr Clifton James Associated diagnoses: g47.33 WatchPAT PA obtained on 12/26/2023 by Latrelle Dodrill, CMA. Authorization: No; tracking ID No PA REQ FOR HST Patient notified of PIN (1234) on 12/26/2023 via Notification Method: phone.

## 2023-12-31 ENCOUNTER — Telehealth: Payer: Self-pay | Admitting: Cardiovascular Disease

## 2023-12-31 ENCOUNTER — Telehealth: Payer: Self-pay

## 2023-12-31 DIAGNOSIS — I2510 Atherosclerotic heart disease of native coronary artery without angina pectoris: Secondary | ICD-10-CM

## 2023-12-31 DIAGNOSIS — I10 Essential (primary) hypertension: Secondary | ICD-10-CM

## 2023-12-31 DIAGNOSIS — G4733 Obstructive sleep apnea (adult) (pediatric): Secondary | ICD-10-CM

## 2023-12-31 DIAGNOSIS — I493 Ventricular premature depolarization: Secondary | ICD-10-CM

## 2023-12-31 DIAGNOSIS — R5383 Other fatigue: Secondary | ICD-10-CM

## 2023-12-31 NOTE — Telephone Encounter (Signed)
Husband is calling in upset that no one has answer the [employer]. Please advise

## 2023-12-31 NOTE — Telephone Encounter (Signed)
-----   Message from Armanda Magic sent at 12/31/2023  8:59 AM EST ----- Regarding: RE: Pacemaker I am not sure our colleagues really know about that and Dr. Clifton James ordered this.  Please change to a split-night sleep study ----- Message ----- From: Brunetta Genera, LPN Sent: 1/61/0960   2:05 PM EST To: Quintella Reichert, MD Subject: Pacemaker                                      I was taught in Training by the [employer] guys that patients with pacemakers are not good candidates for the Itamar. Should we order her a different test? She is concerned because she read on the box that if a pacemaker is present, not to use that device.

## 2023-12-31 NOTE — Telephone Encounter (Addendum)
Spoke with patient, patient will return Itamar device to office, opened. Patient opened box before reading warning label regarding pacemakers and Itamar Device. Patient opened box but did not complete test. Dr. Mayford Knife has ordered a split-night test to be completed. No Pa required for testing. Order sent to [facility] 1/28

## 2024-01-03 NOTE — Telephone Encounter (Signed)
Patient's husband return opened [place] device today. Test could not be completed because patient has Pacemaker which is contradictory to testing and data.

## 2024-01-08 ENCOUNTER — Telehealth: Payer: Self-pay | Admitting: Cardiovascular Disease

## 2024-01-08 NOTE — Telephone Encounter (Signed)
Husband calling to speak with you. Please advise

## 2024-01-09 NOTE — Telephone Encounter (Signed)
 Spoke with patient's husband (DPR). Pt has an oral device that Dr. Micky prescribed and she has an appointment on 2/17 to discuss having it adjusted as it is currently too uncomfortable for her to wear. Pt wishes to proceed with split night study. Provided number for [facility] to schedule test.   Direct office number provided for any additional questions or concerns. Pt's husband expressed appreciation for call.

## 2024-01-15 NOTE — Telephone Encounter (Signed)
Patient has been scheduled for split night study on 03/03/24.

## 2024-01-16 ENCOUNTER — Encounter: Payer: Self-pay | Admitting: Family Medicine

## 2024-01-16 ENCOUNTER — Ambulatory Visit (INDEPENDENT_AMBULATORY_CARE_PROVIDER_SITE_OTHER): Payer: Medicare Other | Admitting: Family Medicine

## 2024-01-16 VITALS — BP 138/82 | HR 62 | Temp 98.5°F | Ht 63.0 in | Wt 144.4 lb

## 2024-01-16 DIAGNOSIS — R5383 Other fatigue: Secondary | ICD-10-CM

## 2024-01-16 NOTE — Patient Instructions (Signed)
Keep going with the mouthguard and I'll await your sleep test results.   Take care.  Glad to see you.

## 2024-01-16 NOTE — Progress Notes (Signed)
Fatigue. Mood d/w pt.   D/w pt re: mouthguard.  She had eval for that but still had some tooth pain.  She has readjustment pending.  She is still snoring.  D/w pt about OSA eval and possible contribution.  She has sleep study pending.  D/w pt about rationale for sleep study use.  She had trouble with CPAP use prior.    Meds, vitals, and allergies reviewed.   ROS: Per HPI unless specifically indicated in ROS section   Nad Ncat Neck supple, no LA Rrr Ctab

## 2024-01-19 NOTE — Assessment & Plan Note (Signed)
I think it makes sense to keep going as planned with the mouthguard adjustment and I'll await her sleep test results.  Discussed how much disrupted sleep could affect her symptoms and I think it makes sense to address this first.

## 2024-01-23 NOTE — Telephone Encounter (Signed)
 Patient's husband return opened [place] device today. Test could not be completed because patient has Pacemaker which is contradictory to testing and data.

## 2024-01-29 ENCOUNTER — Ambulatory Visit (INDEPENDENT_AMBULATORY_CARE_PROVIDER_SITE_OTHER): Payer: Medicare Other

## 2024-01-29 DIAGNOSIS — I495 Sick sinus syndrome: Secondary | ICD-10-CM | POA: Diagnosis not present

## 2024-01-30 LAB — CUP PACEART REMOTE DEVICE CHECK
Battery Impedance: 992 Ohm
Battery Remaining Longevity: 70 mo
Battery Voltage: 2.77 V
Brady Statistic AP VP Percent: 0 %
Brady Statistic AP VS Percent: 46 %
Brady Statistic AS VP Percent: 0 %
Brady Statistic AS VS Percent: 54 %
Date Time Interrogation Session: 20250226094440
Implantable Lead Connection Status: 753985
Implantable Lead Connection Status: 753985
Implantable Lead Implant Date: 20060620
Implantable Lead Implant Date: 20060620
Implantable Lead Location: 753859
Implantable Lead Location: 753860
Implantable Lead Model: 5076
Implantable Lead Model: 5076
Implantable Pulse Generator Implant Date: 20160219
Lead Channel Impedance Value: 735 Ohm
Lead Channel Impedance Value: 909 Ohm
Lead Channel Pacing Threshold Amplitude: 0.5 V
Lead Channel Pacing Threshold Amplitude: 0.75 V
Lead Channel Pacing Threshold Pulse Width: 0.4 ms
Lead Channel Pacing Threshold Pulse Width: 0.4 ms
Lead Channel Setting Pacing Amplitude: 2 V
Lead Channel Setting Pacing Amplitude: 2.5 V
Lead Channel Setting Pacing Pulse Width: 0.4 ms
Lead Channel Setting Sensing Sensitivity: 5.6 mV
Zone Setting Status: 755011
Zone Setting Status: 755011

## 2024-01-31 ENCOUNTER — Ambulatory Visit: Payer: Medicare Other | Admitting: Cardiovascular Disease

## 2024-01-31 ENCOUNTER — Ambulatory Visit (INDEPENDENT_AMBULATORY_CARE_PROVIDER_SITE_OTHER): Payer: Medicare Other | Admitting: Family Medicine

## 2024-01-31 VITALS — BP 132/88 | HR 78 | Temp 98.5°F | Ht 63.0 in | Wt 145.0 lb

## 2024-01-31 DIAGNOSIS — R5383 Other fatigue: Secondary | ICD-10-CM | POA: Diagnosis not present

## 2024-01-31 LAB — COMPREHENSIVE METABOLIC PANEL
ALT: 14 U/L (ref 0–35)
AST: 14 U/L (ref 0–37)
Albumin: 4.3 g/dL (ref 3.5–5.2)
Alkaline Phosphatase: 58 U/L (ref 39–117)
BUN: 17 mg/dL (ref 6–23)
CO2: 29 meq/L (ref 19–32)
Calcium: 8.8 mg/dL (ref 8.4–10.5)
Chloride: 95 meq/L — ABNORMAL LOW (ref 96–112)
Creatinine, Ser: 0.91 mg/dL (ref 0.40–1.20)
GFR: 61.42 mL/min (ref >60.00)
Glucose, Bld: 90 mg/dL (ref 70–99)
Potassium: 4.1 meq/L (ref 3.5–5.1)
Sodium: 133 meq/L — ABNORMAL LOW (ref 135–145)
Total Bilirubin: 0.5 mg/dL (ref 0.2–1.2)
Total Protein: 6.8 g/dL (ref 6.0–8.3)

## 2024-01-31 LAB — CBC WITH DIFFERENTIAL/PLATELET
Basophils Absolute: 0 10*3/uL (ref 0.0–0.1)
Basophils Relative: 0.5 % (ref 0.0–3.0)
Eosinophils Absolute: 0 10*3/uL (ref 0.0–0.7)
Eosinophils Relative: 0.6 % (ref 0.0–5.0)
HCT: 39.4 % (ref 36.0–46.0)
Hemoglobin: 13.5 g/dL (ref 12.0–15.0)
Lymphocytes Relative: 17.2 % (ref 12.0–46.0)
Lymphs Abs: 0.9 10*3/uL (ref 0.7–4.0)
MCHC: 34.3 g/dL (ref 30.0–36.0)
MCV: 95.6 fL (ref 78.0–100.0)
Monocytes Absolute: 0.5 10*3/uL (ref 0.1–1.0)
Monocytes Relative: 10.4 % (ref 3.0–12.0)
Neutro Abs: 3.6 10*3/uL (ref 1.4–7.7)
Neutrophils Relative %: 71.3 % (ref 43.0–77.0)
Platelets: 237 10*3/uL (ref 150.0–400.0)
RBC: 4.13 Mil/uL (ref 3.87–5.11)
RDW: 13 % (ref 11.5–15.5)
WBC: 5.1 10*3/uL (ref 4.0–10.5)

## 2024-01-31 LAB — TSH: TSH: 1.84 u[IU]/mL (ref 0.35–5.50)

## 2024-01-31 LAB — VITAMIN B12: Vitamin B-12: 755 pg/mL (ref 211–911)

## 2024-01-31 NOTE — Progress Notes (Signed)
 She is still trying to adjust to mouthguard, it has been adjusted with further adjustment pending.  Sleep hasn't improved yet per patient report.  She has been able to wear it 2-3 hours at a time.    Mood and memory d/w pt.  Frustrated about being tired and feels depressed about that.  Short term memory changes.  Husband noted it, he could tell her something in the AM and then she'll forget it the next day.  Discussed checking routine labs today re: fatigue/mood.    Meds, vitals, and allergies reviewed.   ROS: Per HPI unless specifically indicated in ROS section   Nad Ncat MMSE 28/30, -1 for orientation and for recall.

## 2024-01-31 NOTE — Patient Instructions (Signed)
 Let me see about options re: meds.  See about options with the mouthguard in the meantime.  Take care.  Glad to see you.

## 2024-02-01 ENCOUNTER — Encounter: Payer: Self-pay | Admitting: Family Medicine

## 2024-02-02 ENCOUNTER — Other Ambulatory Visit: Payer: Self-pay | Admitting: Family Medicine

## 2024-02-02 ENCOUNTER — Encounter: Payer: Self-pay | Admitting: Family Medicine

## 2024-02-02 MED ORDER — DULOXETINE HCL 30 MG PO CPEP: 30.0000 mg | ORAL_CAPSULE | Freq: Every day | ORAL | 2 refills | Status: DC

## 2024-02-02 NOTE — Assessment & Plan Note (Signed)
 See notes on labs.  Fatigue could affect recall, MMSE 28/30, -1 for orientation and for recall.  D/w pt.  She is going to f/u re: mouthguard for OSA.  D/w pt about option of med tx for mood is labs unremarkable.

## 2024-02-05 ENCOUNTER — Encounter: Payer: Self-pay | Admitting: Cardiovascular Disease

## 2024-02-05 DIAGNOSIS — H34811 Central retinal vein occlusion, right eye, with macular edema: Secondary | ICD-10-CM | POA: Diagnosis not present

## 2024-02-06 ENCOUNTER — Other Ambulatory Visit: Payer: Self-pay | Admitting: Family Medicine

## 2024-02-06 ENCOUNTER — Encounter: Payer: Self-pay | Admitting: Family Medicine

## 2024-02-07 ENCOUNTER — Other Ambulatory Visit: Payer: Self-pay | Admitting: Family Medicine

## 2024-02-07 ENCOUNTER — Encounter: Payer: Self-pay | Admitting: Family Medicine

## 2024-02-07 NOTE — Telephone Encounter (Signed)
 Last office visit: 01/31/24 Next office visit: nothing schedules Last refill: LORazepam 1 MG Oral Tablet 11/06/23 120 tablets 2 refills

## 2024-02-07 NOTE — Telephone Encounter (Signed)
 Copied from CRM [id number]. Topic: Clinical - Medication Refill >> Feb 07, 2024 10:30 AM Lennart Pall wrote: Most Recent Primary Care Visit:  Provider: Joaquim Nam  Department: [facility]  Visit Type: OFFICE VISIT  Date: 01/31/2024  Medication: LORazepam (ATIVAN) 1 MG tablet  Has the patient contacted their pharmacy? Yes (Agent: If no, request that the patient contact the pharmacy for the refill. If patient does not wish to contact the pharmacy document the reason why and proceed with request.) (Agent: If yes, when and what did the pharmacy advise?)  Is this the correct pharmacy for this prescription? Yes If no, delete pharmacy and type the correct one.  This is the patient's preferred pharmacy:  [facility] [address] [place] - [address] [place] [address] Phone: [phone] Fax: [phone]   Has the prescription been filled recently? Yes  Is the patient out of the medication? Yes  Has the patient been seen for an appointment in the last year OR does the patient have an upcoming appointment? Yes  Can we respond through MyChart? Yes  Agent: Please be advised that Rx refills may take up to 3 business days. We ask that you follow-up with your pharmacy.

## 2024-02-08 NOTE — Telephone Encounter (Signed)
 Sent. Thanks.

## 2024-02-11 NOTE — Telephone Encounter (Signed)
 Please close rx has been sent

## 2024-02-12 NOTE — Telephone Encounter (Signed)
 Done. Thanks.

## 2024-02-27 NOTE — Telephone Encounter (Signed)
 Per Brandie R. Patient's husband return opened [place] device today. Test could not be completed because patient has Pacemaker which is contradictory to testing and data.

## 2024-03-03 ENCOUNTER — Ambulatory Visit (HOSPITAL_BASED_OUTPATIENT_CLINIC_OR_DEPARTMENT_OTHER): Payer: Medicare Other | Attending: Cardiology | Admitting: Cardiology

## 2024-03-03 DIAGNOSIS — I493 Ventricular premature depolarization: Secondary | ICD-10-CM | POA: Diagnosis not present

## 2024-03-03 DIAGNOSIS — G4733 Obstructive sleep apnea (adult) (pediatric): Secondary | ICD-10-CM | POA: Diagnosis not present

## 2024-03-03 DIAGNOSIS — I10 Essential (primary) hypertension: Secondary | ICD-10-CM | POA: Diagnosis not present

## 2024-03-03 DIAGNOSIS — R5383 Other fatigue: Secondary | ICD-10-CM | POA: Diagnosis not present

## 2024-03-03 DIAGNOSIS — I2510 Atherosclerotic heart disease of native coronary artery without angina pectoris: Secondary | ICD-10-CM | POA: Insufficient documentation

## 2024-03-03 DIAGNOSIS — G4736 Sleep related hypoventilation in conditions classified elsewhere: Secondary | ICD-10-CM | POA: Insufficient documentation

## 2024-03-03 NOTE — Progress Notes (Signed)
 Remote pacemaker transmission.

## 2024-03-03 NOTE — Addendum Note (Signed)
 Addended by: Elease Etienne A on: 03/03/2024 04:11 PM   Modules accepted: Orders

## 2024-03-04 ENCOUNTER — Telehealth: Payer: Self-pay

## 2024-03-04 DIAGNOSIS — G4733 Obstructive sleep apnea (adult) (pediatric): Secondary | ICD-10-CM

## 2024-03-04 NOTE — Telephone Encounter (Signed)
 Left callback number for sleep study results and recommendations.

## 2024-03-04 NOTE — Procedures (Signed)
   [facility] [address] [place], [place] [address] Tel: [phone]   Fax: [phone]  Polysomnography Interpretation  Patient Name:  [name], [name]. Study Date:  03/03/2024 Referring Physician:  Melene Muller, MD  Indications for Polysomnography The patient is a 78 year old Female who is 5\' 3"  and weighs 142.0 lbs. Her BMI equals 24.8.  A full night polysomnogram was performed to evaluate for Obstructive Sleep Apnea.  Medication were taken at  Lorazepam 0.5 mg was taken at 10:00 pm.  Lorazepam 0.5 mg was taken at 12:37 am.  Lorazepam 0.5 mg was taken at 3:34 am   Polysomnogram Data A full night polysomnogram recorded the standard physiologic parameters including EEG, EOG, EMG, EKG, nasal and oral airflow.  Respiratory parameters of chest and abdominal movements were recorded with Respiratory Inductance Plethysmography belts.  Oxygen saturation was recorded by pulse oximetry.   Sleep Architecture The total recording time of the polysomnogram was 378.9 minutes.  The total sleep time was 198.0 minutes.  The patient spent 7.3% of total sleep time in Stage N1, 91.2% in Stage N2, 0.8% in Stages N3, and 0.8% in REM.  Sleep latency was 27.5 minutes.  REM latency was 266.0 minutes.  Sleep Efficiency was 52.3%.  Wake after Sleep Onset time was 153.5 minutes.  Respiratory Events The polysomnogram revealed a presence of 3 obstructive, 0 central, and 0 mixed apneas resulting in an Apnea index of 0.9 events per hour.  There were 78 hypopneas (>=3% desaturation and/or arousal) resulting in an Apnea\Hypopnea Index (AHI >=3% desaturation and/or arousal) of 24.5 events per hour.  There were 43 hypopneas (>=4% desaturation) resulting in an Apnea\Hypopnea Index (AHI >=4% desaturation) of 13.9 events per hour.  There were 18 Respiratory Effort Related Arousals resulting in a RERA index of 5.5 events per hour. The Respiratory Disturbance Index is 30.0 events  per hour.  The snore index was 600.6 events per hour.  Mean oxygen saturation was 90.7%.  The lowest oxygen saturation during sleep was 84.0%.  Time spent <=88% oxygen saturation was 80.2 minutes (23.3%).  Limb Activity There were 0 total limb movements recorded  Cardiac Summary The average pulse rate was 59.9 bpm.  The minimum pulse rate was 55.0 bpm while the maximum pulse rate was 91.0 bpm.  Cardiac rhythm was normal with occasional PACs, atrial couplets and triplets  Diagnosis:  Mild to Moderate Obstructive Sleep Apnea Nocturnal Hypoxemia  Recommendations: Recommend an in lab CPAP titration for sleep disordered breathing. The patient should be counseled in good sleep hygiene and avoid sleeping supine. Encourage patient to not drive when sleepy.   This study was personally reviewed and electronically signed by: Dr. Armanda Magic Accredited Board Certified in Sleep Medicine Date/Time:03/04/2024 9:15AM

## 2024-03-04 NOTE — Telephone Encounter (Signed)
-----   Message from Armanda Magic sent at 03/04/2024  9:18 AM EDT ----- Please let patient know that they have sleep apnea.  Recommend therapeutic CPAP titration for treatment of patient's sleep disordered breathing.

## 2024-03-09 ENCOUNTER — Encounter: Payer: Self-pay | Admitting: Family Medicine

## 2024-03-09 ENCOUNTER — Ambulatory Visit (INDEPENDENT_AMBULATORY_CARE_PROVIDER_SITE_OTHER): Admitting: Family Medicine

## 2024-03-09 VITALS — BP 126/82 | HR 91 | Temp 99.0°F | Ht 63.5 in | Wt 143.6 lb

## 2024-03-09 DIAGNOSIS — R5383 Other fatigue: Secondary | ICD-10-CM

## 2024-03-09 NOTE — Progress Notes (Unsigned)
 Hasn't tried cymbalta yet.  D/w pt about options and use.   D/w pt about sleep study.   Recommend an in lab CPAP titration for sleep disordered breathing. The patient should be counseled in good sleep hygiene and avoid sleeping supine. Encourage patient to not drive when sleepy.  She didn't tolerate CPAP mask in the past, years ago.  She is going to f/u with Dr. Mayford Knife.   She is still fatigued at baseline.  D/w pt about DDx.  D/w pt about sleep study f/u.    Meds, vitals, and allergies reviewed.   ROS: Per HPI unless specifically indicated in ROS section   Nad Ncat Neck supple, no LA Rrr Ctab Abd soft not ttp Skin well-perfused.

## 2024-03-09 NOTE — Patient Instructions (Signed)
 I would try cymbalta and the follow up sleep study.  Take care.  Glad to see you.

## 2024-03-11 ENCOUNTER — Ambulatory Visit: Payer: Medicare Other | Attending: Cardiovascular Disease | Admitting: Cardiovascular Disease

## 2024-03-11 ENCOUNTER — Encounter: Payer: Self-pay | Admitting: Cardiovascular Disease

## 2024-03-11 VITALS — BP 141/87 | HR 80 | Ht 63.5 in | Wt 144.0 lb

## 2024-03-11 DIAGNOSIS — I493 Ventricular premature depolarization: Secondary | ICD-10-CM

## 2024-03-11 NOTE — Progress Notes (Signed)
  Electrophysiology Office Note:    Date:  03/11/2024   ID:  [name], DOB 1946-07-22, MRN [id number]  PCP:  Joaquim Nam, MD   [facility] Providers Cardiologist:  Verne Carrow, MD Electrophysiologist:  Maurice Small, MD     Referring MD: Joaquim Nam, MD   History of Present Illness:    [name] is a 78 y.o. female with a medical history significant for sick sinus syndrome with an Abbott BiV pacemaker, PACs, PVCs referred for device and rhythm management.     Medtronic dual-chamber pacemaker was placed for sick sinus syndrome.  She underwent a generator change in 2016.       Today, she reports that she is doing well.  She notes occasional brief episodes of palpitations that last just 2 or 3 seconds. she has no device related complaints -- no new tenderness, drainage, redness.   EKGs/Labs/Other Studies Reviewed Today:     Echocardiogram:  TTE December 2024 EF 65 to 70%.  Grade 1 diastolic dysfunction.  Moderately enlarged right ventricle.    EKG:   EKG Interpretation Date/Time:  Wednesday March 11 2024 16:11:55 EDT Ventricular Rate:  80 PR Interval:  146 QRS Duration:  84 QT Interval:  364 QTC Calculation: 419 R Axis:   -60  Text Interpretation: Unusual P axis, possible ectopic atrial rhythm Left anterior fascicular block Possible Anterior infarct , age undetermined When compared with ECG of 17-Aug-2023 15:39, ectopic atrial rhythm has replaced sinus rhythm Confirmed by York Pellant [phone]) on 03/11/2024 5:19:30 PM     Physical Exam:    VS:  BP (!) 141/87   Pulse 80   Ht 5' 3.5" (1.613 m)   Wt 144 lb (65.3 kg)   SpO2 95%   BMI 25.11 kg/m     Wt Readings from Last 3 Encounters:  03/11/24 144 lb (65.3 kg)  03/09/24 143 lb 9.6 oz (65.1 kg)  03/03/24 142 lb (64.4 kg)     GEN: Well nourished, well developed in no acute distress CARDIAC: RRR, no murmurs, rubs, gallops she has no device related complaints -- no new  tenderness, drainage, redness.  RESPIRATORY:  Normal work of breathing MUSCULOSKELETAL: no edema    ASSESSMENT & PLAN:     Sick sinus syndrome Medtronic dual-chamber pacemaker in place I reviewed today's device interrogation.  See Paceart for report She is not pacemaker dependent today  PVCs Occasional ventricular ectopy noted on device, sometimes occurring in brief runs lasting up to a few seconds. Continue diltiazem 120 mg    Signed, Maurice Small, MD  03/11/2024 5:20 PM    [facility]

## 2024-03-11 NOTE — Assessment & Plan Note (Signed)
 Discussed trying Cymbalta and discussed rationale for sleep study follow-up.

## 2024-03-11 NOTE — Patient Instructions (Signed)
 Medication Instructions:  Your physician recommends that you continue on your current medications as directed. Please refer to the Current Medication list given to you today. *If you need a refill on your cardiac medications before your next appointment, please call your pharmacy*  Follow-Up: At [facility], you and your health needs are our priority.  As part of our continuing mission to provide you with exceptional heart care, our providers are all part of one team.  This team includes your primary Cardiologist (physician) and Advanced Practice Providers or APPs (Physician Assistants and Nurse Practitioners) who all work together to provide you with the care you need, when you need it.  Your next appointment:   1 year(s)  Provider:   York Pellant, MD        1st Floor: - Lobby - Registration  - Pharmacy  - Lab - Cafe  2nd Floor: - PV Lab - Diagnostic Testing (echo, CT, nuclear med)  3rd Floor: - Vacant  4th Floor: - TCTS (cardiothoracic surgery) - AFib Clinic - Structural Heart Clinic - Vascular Surgery  - Vascular Ultrasound  5th Floor: - HeartCare Cardiology (general and EP) - Clinical Pharmacy for coumadin, hypertension, lipid, weight-loss medications, and med management appointments    Valet parking services will be available as well.

## 2024-03-16 DIAGNOSIS — H2512 Age-related nuclear cataract, left eye: Secondary | ICD-10-CM | POA: Diagnosis not present

## 2024-03-16 DIAGNOSIS — H43813 Vitreous degeneration, bilateral: Secondary | ICD-10-CM | POA: Diagnosis not present

## 2024-03-16 DIAGNOSIS — H35033 Hypertensive retinopathy, bilateral: Secondary | ICD-10-CM | POA: Diagnosis not present

## 2024-03-16 DIAGNOSIS — H35043 Retinal micro-aneurysms, unspecified, bilateral: Secondary | ICD-10-CM | POA: Diagnosis not present

## 2024-03-16 DIAGNOSIS — H34811 Central retinal vein occlusion, right eye, with macular edema: Secondary | ICD-10-CM | POA: Diagnosis not present

## 2024-03-20 ENCOUNTER — Other Ambulatory Visit: Payer: Self-pay | Admitting: Family Medicine

## 2024-03-26 NOTE — Addendum Note (Signed)
 Addended by: Lorraine Terriquez C on: 03/26/2024 05:16 PM   Modules accepted: Orders

## 2024-03-26 NOTE — Telephone Encounter (Signed)
 Received voicemail from pt's spouse, Lesia Raring ([place]), requesting call back. Returned call. Per Lesia Raring, he spoke with Brandie and pt is willing to have a CPAP titration. He expressed appreciation of call and denies additional questions or concerns at this time.

## 2024-03-27 ENCOUNTER — Telehealth: Payer: Self-pay

## 2024-03-27 NOTE — Telephone Encounter (Signed)
**Note De-Identified [name] Obfuscation** Per the [facility] Provider Portal: Prior Authorization/Notification is not required for the requested service(s): CPT Code: 95811-CPAP Titration.  Decision ID #: [id number]  I have transferred the CPAP Titration order to the sleep lab and I called the pt's husband, [name] to make him aware of this outcome and I did give the sleep labs phone number so he can call to schedule her test.   He verbalized understanding and thanked me for my call.

## 2024-03-30 ENCOUNTER — Encounter: Payer: Self-pay | Admitting: Family Medicine

## 2024-04-02 ENCOUNTER — Telehealth: Payer: Self-pay | Admitting: Family Medicine

## 2024-04-02 ENCOUNTER — Encounter: Payer: Self-pay | Admitting: Family Medicine

## 2024-04-02 ENCOUNTER — Ambulatory Visit (INDEPENDENT_AMBULATORY_CARE_PROVIDER_SITE_OTHER): Admitting: Family Medicine

## 2024-04-02 VITALS — BP 136/84 | HR 77 | Temp 98.6°F | Ht 63.5 in | Wt 143.6 lb

## 2024-04-02 DIAGNOSIS — R202 Paresthesia of skin: Secondary | ICD-10-CM

## 2024-04-02 NOTE — Telephone Encounter (Signed)
 Noted. Thanks.

## 2024-04-02 NOTE — Telephone Encounter (Signed)
 Please triage patient about recent numbness.  Thanks.

## 2024-04-02 NOTE — Telephone Encounter (Signed)
 I spoke with pts husband (DPR signed) pt has not had episode of numbness in fingers since 03/28/24. Pt does not routinely take BP. Pt has no H/A,dizziness,CP,SOB,vision Changes. And no problem walking or using extremities. Pt speech is normal. Pt is presently eating breakfast and only complaint is feeling tired but that has been going on for one year; not new symptom. Pt already has appt with Dr Vallarie Gauze 04/02/24 at 3:30. Pt will keep that appt with UC & ED precautions and pts husband voices understanding. Sending note to Dr Vallarie Gauze as Arlie Lain.

## 2024-04-02 NOTE — Progress Notes (Unsigned)
 She hasn't started cymbalta  yet, d/w pt.   Sx started in L 5th finger. Then progressed across the other fingers, then similar sx in the R fingers.  Then R cheek sensation changes.   No grip changes.  Sensation was locally altered.  Lasted about 5-10 minutes.  Then it resolved.  Was back to normal.    Then had 2nd event, L hand sx less than R hand with altered sensation.  Then had altered sensation near the R ear.  That self resolved.    No sx up the arms.    Normal speech, though, smile.  No weakness.  Swallowing well.    No sx in the interval.    She had antecedent episodes of paresthesias in the fingers, usually 4th and 5th fingers, bilaterally.  That is longstanding.   Prev CT with  IMPRESSION: 1. Mild left C5 and bilateral C6 foraminal stenosis. 2. No acute fracture or static subluxation of the cervical spine.  She is s/p PT for her neck.    D/w pt about CPAP study.  She hasn't tolerated mouthguard.  Hasn't started cymbalta  yet, d/w pt.    Meds, vitals, and allergies reviewed.   ROS: Per HPI unless specifically indicated in ROS section     Tinel neg B wrists.  CN2-12 wnl.  S/S grossly wnl x4, monofilament wnl B hands.   No sx with neck ROM but limited neck ROM at baseline.  No sx with B ulnar groove compression.

## 2024-04-02 NOTE — Patient Instructions (Signed)
 Let me know if you have more episodes in the meantime.  We can set up a CT at that point if needed.  If new or worsening symptoms, then go to the ER.  Take care.  Glad to see you.

## 2024-04-05 NOTE — Assessment & Plan Note (Signed)
 Unclear source.  No symptoms now.  Discussed options.  I would not expect CVA to cause her symptoms as described.  She could have a component of radiculopathy from her neck contributing to her hand symptoms.  Observe for now.  She can let me know if she has more episodes in the meantime.  We can set up a CT head at that point if needed.  If new or worsening symptoms, then go to the ER.   Still fatigued.  Not yet on Cymbalta .  Has not had follow-up CPAP study done yet.  Encouraged both.

## 2024-04-08 DIAGNOSIS — H34811 Central retinal vein occlusion, right eye, with macular edema: Secondary | ICD-10-CM | POA: Diagnosis not present

## 2024-04-17 ENCOUNTER — Encounter: Payer: Self-pay | Admitting: Family Medicine

## 2024-04-17 ENCOUNTER — Ambulatory Visit: Admitting: Family Medicine

## 2024-04-17 VITALS — BP 138/92 | HR 80 | Temp 98.4°F | Ht 63.5 in | Wt 142.8 lb

## 2024-04-17 DIAGNOSIS — K219 Gastro-esophageal reflux disease without esophagitis: Secondary | ICD-10-CM

## 2024-04-17 DIAGNOSIS — R5383 Other fatigue: Secondary | ICD-10-CM

## 2024-04-17 MED ORDER — FAMOTIDINE 20 MG PO TABS: 40.0000 mg | ORAL_TABLET | Freq: Every day | ORAL | Status: DC

## 2024-04-17 NOTE — Progress Notes (Signed)
 D/w pt about pepcid  use. She had a brand change in the meantime.  Husband noted she has some GI sx with burning in the chest prior to brand change.  Taking mylanta and that helps some, used prn. Burning isn't exertional or worse with exertion.  She can have some tongue irritation.    D/w pt about fatigue, cymbalta  use.  Not yet on med. D/w pt about options.  She is hesitant to start med.   CTFFR suggests nonobstructive CAD Echo with no findings to suggest the cause her fatigue.   She is progressively fatigued.  Prev statin cessation didn't help.    Discussed rationale for getting her sleep study done.  Meds, vitals, and allergies reviewed.   ROS: Per HPI unless specifically indicated in ROS section   Nad Ncat Neck supple, no LA Rrr Ctab Abd soft not ttp Skin well-perfused.

## 2024-04-17 NOTE — Patient Instructions (Addendum)
 You could try taking 40mg  pepcid  in the AM instead of 20mg  twice a day and see if that helps.   Try elevating the head of your bed by about 3 inches.   Take care.  Glad to see you. I'll check with Dr. Micael Adas in the meantime.

## 2024-04-19 NOTE — Assessment & Plan Note (Signed)
 I will ask Dr. Micael Adas about getting patient seen for sleep study sooner.   Discussed rationale for sleep study evaluation.  Discussed trial of Cymbalta  in case depression is affecting her fatigue.

## 2024-04-19 NOTE — Assessment & Plan Note (Signed)
 She could try taking 40mg  pepcid  in the AM instead of 20mg  twice a day and see if that helps.   Try elevating the head of your bed by about 3 inches.   Continue Mylanta as needed.

## 2024-04-29 ENCOUNTER — Ambulatory Visit: Payer: Medicare Other

## 2024-04-29 DIAGNOSIS — I495 Sick sinus syndrome: Secondary | ICD-10-CM

## 2024-04-30 LAB — CUP PACEART REMOTE DEVICE CHECK
Battery Impedance: 1098 Ohm
Battery Remaining Longevity: 66 mo
Battery Voltage: 2.77 V
Brady Statistic AP VP Percent: 0 %
Brady Statistic AP VS Percent: 36 %
Brady Statistic AS VP Percent: 0 %
Brady Statistic AS VS Percent: 64 %
Date Time Interrogation Session: 20250528104043
Implantable Lead Connection Status: 753985
Implantable Lead Connection Status: 753985
Implantable Lead Implant Date: 20060620
Implantable Lead Implant Date: 20060620
Implantable Lead Location: 753859
Implantable Lead Location: 753860
Implantable Lead Model: 5076
Implantable Lead Model: 5076
Implantable Pulse Generator Implant Date: 20160219
Lead Channel Impedance Value: 675 Ohm
Lead Channel Impedance Value: 890 Ohm
Lead Channel Pacing Threshold Amplitude: 0.5 V
Lead Channel Pacing Threshold Amplitude: 0.625 V
Lead Channel Pacing Threshold Pulse Width: 0.4 ms
Lead Channel Pacing Threshold Pulse Width: 0.4 ms
Lead Channel Setting Pacing Amplitude: 2 V
Lead Channel Setting Pacing Amplitude: 2.5 V
Lead Channel Setting Pacing Pulse Width: 0.4 ms
Lead Channel Setting Sensing Sensitivity: 5.6 mV
Zone Setting Status: 755011
Zone Setting Status: 755011

## 2024-05-03 ENCOUNTER — Encounter (HOSPITAL_COMMUNITY): Payer: Self-pay

## 2024-05-03 ENCOUNTER — Emergency Department (HOSPITAL_COMMUNITY)
Admission: EM | Admit: 2024-05-03 | Discharge: 2024-05-03 | Disposition: A | Discharge status: Home / Self Care | Attending: Emergency Medicine | Admitting: Emergency Medicine

## 2024-05-03 ENCOUNTER — Other Ambulatory Visit: Payer: Self-pay

## 2024-05-03 DIAGNOSIS — R5383 Other fatigue: Secondary | ICD-10-CM | POA: Diagnosis not present

## 2024-05-03 DIAGNOSIS — Z950 Presence of cardiac pacemaker: Secondary | ICD-10-CM | POA: Diagnosis not present

## 2024-05-03 DIAGNOSIS — R6889 Other general symptoms and signs: Secondary | ICD-10-CM | POA: Diagnosis not present

## 2024-05-03 DIAGNOSIS — R002 Palpitations: Secondary | ICD-10-CM | POA: Diagnosis not present

## 2024-05-03 DIAGNOSIS — I499 Cardiac arrhythmia, unspecified: Secondary | ICD-10-CM | POA: Diagnosis not present

## 2024-05-03 DIAGNOSIS — E871 Hypo-osmolality and hyponatremia: Secondary | ICD-10-CM | POA: Diagnosis not present

## 2024-05-03 LAB — CBC
HCT: 41.9 % (ref 36.0–46.0)
Hemoglobin: 14.5 g/dL (ref 12.0–15.0)
MCH: 32.2 pg (ref 26.0–34.0)
MCHC: 34.6 g/dL (ref 30.0–36.0)
MCV: 92.9 fL (ref 80.0–100.0)
Platelets: 214 10*3/uL (ref 150–400)
RBC: 4.51 MIL/uL (ref 3.87–5.11)
RDW: 12.4 % (ref 11.5–15.5)
WBC: 4.7 10*3/uL (ref 4.0–10.5)
nRBC: 0 % (ref 0.0–0.2)

## 2024-05-03 LAB — BASIC METABOLIC PANEL WITH GFR
Anion gap: 9 (ref 5–15)
BUN: 11 mg/dL (ref 8–23)
CO2: 25 mmol/L (ref 22–32)
Calcium: 8.5 mg/dL — ABNORMAL LOW (ref 8.9–10.3)
Chloride: 98 mmol/L (ref 98–111)
Creatinine, Ser: 0.97 mg/dL (ref 0.44–1.00)
GFR, Estimated: >60 mL/min (ref >60)
Glucose, Bld: 112 mg/dL — ABNORMAL HIGH (ref 70–99)
Potassium: 3.9 mmol/L (ref 3.5–5.1)
Sodium: 132 mmol/L — ABNORMAL LOW (ref 135–145)

## 2024-05-03 LAB — MAGNESIUM: Magnesium: 2.2 mg/dL (ref 1.7–2.4)

## 2024-05-03 NOTE — Discharge Instructions (Signed)
 Please follow-up with your cardiologist.

## 2024-05-03 NOTE — ED Notes (Signed)
 CCMD contacted

## 2024-05-03 NOTE — ED Triage Notes (Signed)
 Pt coming from home, several episodes of palpations this morning. C/O fatigued. Denies dizziness, shob, n/v. Pt has pacemaker and takes cardizem .

## 2024-05-03 NOTE — ED Provider Notes (Signed)
 [place] EMERGENCY DEPARTMENT AT [facility] Provider Note   CSN: [id number] Arrival date & time: 05/03/24  1238     History  Chief Complaint  Patient presents with   Palpitations    [name] is a 78 y.o. female with a history of sick sinus syndrome status post Medtronic dual-chamber pacemaker presented to ED with complaint of palpitations this morning.  Patient reports she woke up feeling mildly nauseated which is chronic issue for her.  She has been feeling fatigued for about a year since she had COVID and her doctors think she may have long COVID syndrome she reports.  However she began having palpitations that were fairly persistent and more aggressive than she has had in the past.  She had a brief period of tachycardia her heart rate went to the 90s.  This is since settled down and she feels back to normal.  She denies smoking, alcohol  consumption, or caffeine consumption.  She is here with her husband.  I did review her external records including her coronary CT scan in November 2024 which noted nonobstructive coronary disease.  HPI     Home Medications Prior to Admission medications   Medication Sig Start Date End Date Taking? Authorizing Provider  acetaminophen  (TYLENOL ) 500 MG tablet Take 1,000 mg by mouth every 6 (six) hours as needed (pain).    [provider]  diltiazem  (CARDIZEM  CD) 120 MG 24 hr capsule Take 1 capsule (120 mg total) by mouth daily. 10/22/23   Donnie Galea, MD  DULoxetine  (CYMBALTA ) 30 MG capsule Take 1 capsule (30 mg total) by mouth daily. Patient not taking: Reported on 04/17/2024 02/02/24   Donnie Galea, MD  famotidine  (PEPCID ) 20 MG tablet Take 2 tablets (40 mg total) by mouth daily. 04/17/24   Donnie Galea, MD  LORazepam  (ATIVAN ) 1 MG tablet TAKE 1 TABLET BY MOUTH EVERY 6 HOURS AS NEEDED 02/08/24   Donnie Galea, MD  Polyethyl Glycol-Propyl Glycol (SYSTANE OP) Place 1 drop into both eyes daily as needed (dry eyes).      [provider]  rosuvastatin  (CRESTOR ) 10 MG tablet Take 0.5 tablets (5 mg total) by mouth daily. 09/20/23 03/09/24  Donnie Galea, MD  terconazole  (TERAZOL 7 ) 0.4 % vaginal cream Place 1 applicator vaginally at bedtime as needed. 05/21/23   Donnie Galea, MD      Allergies    Hydrocodone; Antihistamines, loratadine-type; Cephalexin; Citalopram; Codeine; Demerol [meperidine]; Epinephrine ; Erythromycin; Hydrochlorothiazide ; Hyoscyamine  sulfate; Inderal [propranolol]; Lidocaine ; Lipitor [atorvastatin]; Losartan potassium; Morphine; Nebivolol; Paxil [paroxetine hcl]; Penicillins; Prednisone; Propranolol hcl; Sertraline hcl; Tetracycline; Toprol  xl [metoprolol  succinate]; Verapamil; Hydralazine ; and Oxycodone -acetaminophen     Review of Systems   Review of Systems  Physical Exam Updated Vital Signs BP (!) 152/85   Pulse 61   Temp 98 F (36.7 C) (Oral)   Resp 18   Ht 5\' 4"  (1.626 m)   Wt 63.5 kg   SpO2 97%   BMI 24.03 kg/m  Physical Exam Constitutional:      General: She is not in acute distress. HENT:     Head: Normocephalic and atraumatic.  Eyes:     Conjunctiva/sclera: Conjunctivae normal.     Pupils: Pupils are equal, round, and reactive to light.  Cardiovascular:     Rate and Rhythm: Normal rate and regular rhythm.  Pulmonary:     Effort: Pulmonary effort is normal. No respiratory distress.  Abdominal:     General: There is no distension.  Tenderness: There is no abdominal tenderness.  Skin:    General: Skin is warm and dry.  Neurological:     General: No focal deficit present.     Mental Status: She is alert. Mental status is at baseline.  Psychiatric:        Mood and Affect: Mood normal.        Behavior: Behavior normal.     ED Results / Procedures / Treatments   Labs (all labs ordered are listed, but only abnormal results are displayed) Labs Reviewed  BASIC METABOLIC PANEL WITH GFR - Abnormal; Notable for the following components:      Result  Value   Sodium 132 (*)    Glucose, Bld 112 (*)    Calcium  8.5 (*)    All other components within normal limits  CBC  MAGNESIUM    EKG EKG Interpretation Date/Time:  Sunday May 03 2024 12:44:03 EDT Ventricular Rate:  66 PR Interval:  164 QRS Duration:  93 QT Interval:  516 QTC Calculation: 541 R Axis:   -58  Text Interpretation: Sinus rhythm Left anterior fascicular block Prolonged QT interval Confirmed by Jerald Molly [phone]) on 05/03/2024 12:52:30 PM  Radiology No results found.  Procedures Procedures    Medications Ordered in ED Medications - No data to display  ED Course/ Medical Decision Making/ A&P Clinical Course as of 05/03/24 1521  Sun May 03, 2024  1505 I reviewed the patient's interrogation, no life-threatening arrhythmias noted. [MT]    Clinical Course User Index [MT] Quetzali Heinle, Janalyn Me, MD                                 Medical Decision Making Amount and/or Complexity of Data Reviewed Labs: ordered.   This patient presents to the ED with concern for palpitations, tachycardia. This involves an extensive number of treatment options, and is a complaint that carries with it a high risk of complications and morbidity.  The differential diagnosis includes somatic PACs or PVCs versus SVT versus A-fib versus other arrhythmia  Co-morbidities that complicate the patient evaluation: History of sick sinus syndrome  Additional history obtained from patient's husband at bedside  External records from outside source obtained and reviewed including coronary CT scan  I ordered and personally interpreted labs.  The pertinent results include: Very mild hyponatremia, likely related to dietary intake, no emergent findings otherwise  The patient was maintained on a cardiac monitor.  I personally viewed and interpreted the cardiac monitored which showed an underlying rhythm of: Paced rhythm borderline sinus bradycardia, occasional PVC  Per my interpretation the  patient's ECG shows regular heart rate with no ischemic findings  I have reviewed the patients home medicines and have made adjustments as needed  Test Considered: Patient is not having any signs or symptoms of PE, pneumonia, pneumothorax.  No indication for emergent thoracic imaging at this time  Low suspicion for ACS.  Dispostion:  After consideration of the diagnostic results and the patients response to treatment, I feel that the patent would benefit from outpatient follow-up.         Final Clinical Impression(s) / ED Diagnoses Final diagnoses:  Palpitations  Hyponatremia    Rx / DC Orders ED Discharge Orders     None         Kyrianna Barletta, Janalyn Me, MD 05/03/24 1521

## 2024-05-04 ENCOUNTER — Telehealth: Payer: Self-pay | Admitting: *Deleted

## 2024-05-04 MED ORDER — DILTIAZEM HCL 30 MG PO TABS: 30.0000 mg | ORAL_TABLET | Freq: Every day | ORAL | 3 refills | Status: AC | PRN

## 2024-05-04 NOTE — Telephone Encounter (Signed)
 Received message from Dr. Abel Hoe that patient having more frequent palpitations and is starting her on diltiazem  30 mg - one tablet as needed for palpitations.  Prescription sent.

## 2024-05-05 ENCOUNTER — Encounter: Payer: Self-pay | Admitting: Cardiovascular Disease

## 2024-05-06 ENCOUNTER — Ambulatory Visit: Payer: Self-pay | Admitting: Cardiovascular Disease

## 2024-05-07 ENCOUNTER — Other Ambulatory Visit: Payer: Self-pay | Admitting: Family Medicine

## 2024-05-08 NOTE — Telephone Encounter (Signed)
 LOV:04/17/24 NOV:05/11/24 LAST REFILL: LORazepam  1 MG Oral Tablet 02/08/24 120 tablets 2 refills

## 2024-05-11 ENCOUNTER — Encounter: Payer: Self-pay | Admitting: Family Medicine

## 2024-05-11 ENCOUNTER — Ambulatory Visit (INDEPENDENT_AMBULATORY_CARE_PROVIDER_SITE_OTHER): Admitting: Family Medicine

## 2024-05-11 VITALS — BP 138/74 | HR 71 | Temp 99.1°F | Ht 64.0 in | Wt 143.0 lb

## 2024-05-11 DIAGNOSIS — R002 Palpitations: Secondary | ICD-10-CM

## 2024-05-11 DIAGNOSIS — F419 Anxiety disorder, unspecified: Secondary | ICD-10-CM

## 2024-05-11 NOTE — Patient Instructions (Addendum)
 I would try extra diltiazem  if needed.  I'll await the sleep study results.   I would still try cymbalta .  You could call [facility] at [phone].

## 2024-05-11 NOTE — Progress Notes (Unsigned)
 ER eval 05/03/24 with palpitations.  ER notes reviewed.  Cardiology messaging noted after that, rx sent for diltiazem  30mg .  No use of 30mg  diltiazem  yet.  D/w pt about prn use.   05/03/24 she didn't feel well upon getting out of bed.  She noted inc heart rate, then called 911.    Per ER note: Sun May 03, 2024  1505 I reviewed the patient's interrogation, no life-threatening arrhythmias noted.   No syncope.  Not SOB.  No CP.  Still with occ sensation of skipped beats but this doesn't feel like 05/03/24.  This is her typical baseline.   Not yet on cymbalta .  Sleep study is still pending.  She can still get tearful.    We talked about counseling re: anxiety.

## 2024-05-13 NOTE — Assessment & Plan Note (Signed)
 I'll await the sleep study results.   I would still try cymbalta . D/w pt.  She can call [facility] at [phone].

## 2024-05-13 NOTE — Assessment & Plan Note (Signed)
 I would try extra diltiazem  if needed.  I'll await the sleep study results.

## 2024-05-19 ENCOUNTER — Telehealth: Payer: Self-pay | Admitting: Internal Medicine

## 2024-05-19 DIAGNOSIS — G4733 Obstructive sleep apnea (adult) (pediatric): Secondary | ICD-10-CM

## 2024-05-19 NOTE — Telephone Encounter (Signed)
 Request from Dr Ardell Koller' office. Patient tells them she wants to go back to OAP and not CPAP. So instead of CPAP titration sleep study scheduled 06/02/24 Change order for sleep study to:  Split night study. After 4 hours, patient to sleep with her oral appliance in place, NOT CPAP     dx OSA

## 2024-05-19 NOTE — Telephone Encounter (Signed)
 Please see the order change that Dr. Linder Revere has placed for the CPAP titration on this patient:  Change to a SPLIT NIGHT SLEEP STUDY.  AFTER 4 HOURS, PATIENT IS TO SLEEP WITH HER ORAL APPLIANCE IN PLACE -- NOT USE THE CPAP.  Please ask Dr. Linder Revere or Devaney Segers if you have any questions.

## 2024-05-20 NOTE — Telephone Encounter (Signed)
 Placed order for Split night study with orders per Dr. Linder Revere.

## 2024-05-21 ENCOUNTER — Ambulatory Visit (INDEPENDENT_AMBULATORY_CARE_PROVIDER_SITE_OTHER): Admitting: Internal Medicine

## 2024-05-21 VITALS — BP 126/84 | HR 66 | Temp 98.6°F | Ht 63.5 in | Wt 143.0 lb

## 2024-05-21 DIAGNOSIS — K2100 Gastro-esophageal reflux disease with esophagitis, without bleeding: Secondary | ICD-10-CM | POA: Diagnosis not present

## 2024-05-21 MED ORDER — OMEPRAZOLE 20 MG PO CPDR: 20.0000 mg | DELAYED_RELEASE_CAPSULE | Freq: Every day | ORAL | 3 refills | Status: DC

## 2024-05-21 NOTE — Progress Notes (Signed)
 Subjective:    Patient ID: [name], female    DOB: 1946-10-18, 78 y.o.   MRN: [id number]  HPI Here due to mouth irritation With husband  Wakes at night with mouth and tongue burning Takes mylanta and it helps Started about 1.5-2 weeks ago Comes and goes  Careful with eating---some foods make it worse (like a salty [employer] sandwich 2 nights ago and seemed to be worse that night) May have mild tongue irritation in the day--mostly just nighttime  Gets burning substernal with night symptoms Gets symptoms in day after eating as well No dysphagia Does take famotidine  twice a day  Not too active but no exertional chest pain  Current Outpatient Medications on File Prior to Visit  Medication Sig Dispense Refill   acetaminophen  (TYLENOL ) 500 MG tablet Take 1,000 mg by mouth every 6 (six) hours as needed (pain).     diltiazem  (CARDIZEM  CD) 120 MG 24 hr capsule Take 1 capsule (120 mg total) by mouth daily.     diltiazem  (CARDIZEM ) 30 MG tablet Take 1 tablet (30 mg total) by mouth daily as needed. 30 tablet 3   famotidine  (PEPCID ) 20 MG tablet Take 2 tablets (40 mg total) by mouth daily.     LORazepam  (ATIVAN ) 1 MG tablet TAKE 1 TABLET BY MOUTH EVERY 6 HOURS AS NEEDED 120 tablet 2   Polyethyl Glycol-Propyl Glycol (SYSTANE OP) Place 1 drop into both eyes daily as needed (dry eyes).      rosuvastatin  (CRESTOR ) 10 MG tablet Take 0.5 tablets (5 mg total) by mouth daily.     terconazole  (TERAZOL 7 ) 0.4 % vaginal cream Place 1 applicator vaginally at bedtime as needed. 45 g 3   DULoxetine  (CYMBALTA ) 30 MG capsule Take 1 capsule (30 mg total) by mouth daily. (Patient not taking: Reported on 05/21/2024) 30 capsule 2   No current facility-administered medications on file prior to visit.    Allergies  Allergen Reactions   Hydrocodone     Increased HR   Antihistamines, Loratadine-Type Other (See Comments)    Heart races/jittery   Cephalexin Nausea And Vomiting   Citalopram      intolerant   Codeine Other (See Comments)    Increased heart rate, anxiety   Demerol [Meperidine] Other (See Comments)    Increased heart rate, anxiety   Epinephrine  Other (See Comments)    Increased heart rate   Erythromycin Nausea And Vomiting   Hydrochlorothiazide      Low sodium   Hyoscyamine  Sulfate Other (See Comments)    Pt could not tolerate this med UNKNOWN reaction   Inderal [Propranolol] Other (See Comments)    Extreme tiredness   Lidocaine  Other (See Comments)    Family hx of malignant hyperthermia   Lipitor [Atorvastatin] Other (See Comments)    Muscle aches   Losartan Potassium Other (See Comments)    Pt could not tolerate this med UNKNOWN REACTION   Morphine Other (See Comments)    Nightmares, increased BP   Nebivolol Other (See Comments)    angioedema   Paxil [Paroxetine Hcl] Other (See Comments)    Intolerant, mood changes.    Penicillins Nausea And Vomiting   Prednisone Other (See Comments)    Tolerates low doses if absolutely necessary - makes her nervous, jittery   Propranolol Hcl Other (See Comments)    Pt could not tolerate this med UNKNOWN REACTION   Sertraline Hcl Other (See Comments)    Pt could not tolerate this med UNKNOWN REACTION  Tetracycline Nausea And Vomiting   Toprol  Xl [Metoprolol  Succinate] Other (See Comments)    Extreme tiredness   Verapamil Other (See Comments)    Pt could not tolerate this med UNKNOWN REACTION   Hydralazine      headaches   Oxycodone -Acetaminophen  Palpitations    Causes accelerated heart rate    Past Medical History:  Diagnosis Date   Adenomatous colon polyp 12/2006   Allergic rhinitis    Anemia    Anxiety    Appendicitis 04/2020   Arthritis    Bradycardia    s/p MDT PPM   Cataract    bilateral   Depression    Esophageal stricture    Family history of adverse reaction to anesthesia    maternal uncle with MH, nephew had MH during surgery and was OK, pt not been tested   GERD (gastroesophageal  reflux disease)    Hemorrhoids    Hyperlipidemia    Hypertension    Inguinal hernia    Osteoporosis    Presence of permanent cardiac pacemaker 2006   SSS   PVC (premature ventricular contraction)    Scleritis of right eye    Skin cancer    Basal and squamous cell   Sleep apnea    a. intolerant of CPAP, does wear a mouthpiece   Subdural hematoma (HCC)    a. spontanous 09/2017 b. resolved without intervention   Thyroid  disease     Past Surgical History:  Procedure Laterality Date   ABDOMINAL HYSTERECTOMY     APPENDECTOMY     BREAST BIOPSY Left 01/15/2014   BREAST BIOPSY Right 06/25/2023   US  RT BREAST BX W LOC DEV 1ST LESION IMG BX SPEC US  GUIDE 06/25/2023 [facility]   BREAST SURGERY     Biopsy-benign   CESAREAN SECTION     COLONOSCOPY  2018   DILATION AND CURETTAGE OF UTERUS     ESOPHAGOGASTRODUODENOSCOPY     esophageal stretching   HAND SURGERY  1992   HERNIA REPAIR     INGUINAL HERNIA REPAIR  02/12/1994   left inguinal hernia repair   KNEE ARTHROSCOPY  2011   LAPAROSCOPIC APPENDECTOMY N/A 11/29/2020   Procedure: APPENDECTOMY LAPAROSCOPIC;  Surgeon: Anda Bamberg, MD;  Location: [facility];  Service: General;  Laterality: N/A;   OOPHORECTOMY  1995   LSO and RSO   PACEMAKER GENERATOR CHANGE N/A 01/21/2015   Procedure: PACEMAKER GENERATOR CHANGE;  Surgeon: Jolly Needle, MD;  Location: [facility];  Service: Cardiovascular;  Laterality: N/A;   PACEMAKER INSERTION  2006   Dr. Ardyce Bee   POLYPECTOMY     Tear duct surgery  2010   THUMB SURGERY     THYROIDECTOMY  1979   RIGHT SIDE   TOTAL KNEE ARTHROPLASTY Right 12/20/2014   Procedure: RIGHT TOTAL KNEE ARTHROPLASTY;  Surgeon: Christie Cox, MD;  Location: [facility] OR;  Service: Orthopedics;  Laterality: Right;   UPPER GASTROINTESTINAL ENDOSCOPY     unsure    Family History  Problem Relation Age of Onset   Heart failure Sister    Heart disease Sister    Hypertension Sister    Colon polyps Sister     Dementia Mother    Hypertension Mother    Colon polyps Mother    Hypertension Father    Arthritis Father    Breast cancer Maternal Aunt        Age 72's   Diabetes Maternal Aunt    Diabetes Maternal Uncle    Ovarian cancer  Maternal Aunt    Colon cancer Neg Hx    Esophageal cancer Neg Hx    Stomach cancer Neg Hx    Rectal cancer Neg Hx     Social History   Socioeconomic History   Marital status: Married    Spouse name: Actor   Number of children: 1   Years of education: Not on file   Highest education level: 12th grade  Occupational History   Occupation: Retired    Associate Professor: UNEMPLOYED  Tobacco Use   Smoking status: Never   Smokeless tobacco: Never  Vaping Use   Vaping status: Never Used  Substance and Sexual Activity   Alcohol  use: No    Alcohol /week: 0.0 standard drinks of alcohol    Drug use: No   Sexual activity: Not Currently    Birth control/protection: Surgical, Post-menopausal    Comment: HYST-1st intercourse 78 yo-fewer than 5 partners  Other Topics Concern   Not on file  Social History Narrative   Education:  12th grade   Married 1971   1 son in [place] , 4 grandkids   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   Social Drivers of Corporate investment banker Strain: Low Risk  (05/21/2024)   Overall Financial Resource Strain (CARDIA)    Difficulty of Paying Living Expenses: Not hard at all  Food Insecurity: No Food Insecurity (05/21/2024)   Hunger Vital Sign    Worried About Running Out of Food in the Last Year: Never true    Ran Out of Food in the Last Year: Never true  Transportation Needs: No Transportation Needs (05/21/2024)   PRAPARE - Administrator, Civil Service (Medical): No    Lack of Transportation (Non-Medical): No  Physical Activity: Inactive (05/21/2024)   Exercise Vital Sign    Days of Exercise per Week: 0 days    Minutes of Exercise per Session: Not on file  Stress: Stress Concern Present (05/21/2024)   [employer] of  Occupational Health - Occupational Stress Questionnaire    Feeling of Stress: To some extent  Social Connections: Unknown (05/21/2024)   Social Connection and Isolation Panel    Frequency of Communication with Friends and Family: More than three times a week    Frequency of Social Gatherings with Friends and Family: Patient declined    Attends Religious Services: More than 4 times per year    Active Member of [employer] or Organizations: Patient declined    Attends Banker Meetings: Not on file    Marital Status: Married  Intimate Partner Violence: Not At Risk (07/15/2020)   Humiliation, Afraid, Rape, and Kick questionnaire    Fear of Current or Ex-Partner: No    Emotionally Abused: No    Physically Abused: No    Sexually Abused: No   Review of Systems Known sleep apnea--does snore some Awaiting CPAP titration No inhalers    Objective:   Physical Exam Constitutional:      Appearance: Normal appearance.  HENT:     Mouth/Throat:     Pharynx: No oropharyngeal exudate or posterior oropharyngeal erythema.     Comments: No oral lesions  Cardiovascular:     Rate and Rhythm: Normal rate and regular rhythm.     Heart sounds: No murmur heard.    No gallop.  Pulmonary:     Effort: Pulmonary effort is normal.     Breath sounds: Normal breath sounds. No wheezing or rales.  Abdominal:     Palpations: Abdomen is soft.  Tenderness: There is no abdominal tenderness.   Musculoskeletal:     Cervical back: Neck supple.  Lymphadenopathy:     Cervical: No cervical adenopathy.   Neurological:     Mental Status: She is alert.            Assessment & Plan:

## 2024-05-21 NOTE — Assessment & Plan Note (Addendum)
 Clear symptoms of water brash at night for about 2 weeks--likely had bad nocturnal spell Discussed avoiding eating before bedtime Can raise head of bed Will add omeprazole  at bedtime--can continue the famotidine  for now, but consider weaning off if symptoms resolve

## 2024-05-29 ENCOUNTER — Ambulatory Visit (INDEPENDENT_AMBULATORY_CARE_PROVIDER_SITE_OTHER): Admitting: Family Medicine

## 2024-05-29 ENCOUNTER — Encounter: Payer: Self-pay | Admitting: Family Medicine

## 2024-05-29 VITALS — BP 126/76 | HR 68 | Temp 99.1°F | Resp 20 | Ht 63.5 in | Wt 140.4 lb

## 2024-05-29 DIAGNOSIS — R5383 Other fatigue: Secondary | ICD-10-CM

## 2024-05-29 DIAGNOSIS — K219 Gastro-esophageal reflux disease without esophagitis: Secondary | ICD-10-CM | POA: Diagnosis not present

## 2024-05-29 NOTE — Patient Instructions (Signed)
 Try elevating the head of your bed.  Hold cymbalta  for now, don't start just before the sleep study.  Keep taking famotidine  but okay to use prilosec as needed.  Take care.  Glad to see you.

## 2024-05-29 NOTE — Progress Notes (Unsigned)
 Tongue irritation is better in the meantime.  Prev OV d/w pt. Not yet taking prilosec.  Still on famotidine .  D/w pt about elevating the head of med.    She was prev waking with burning in the mouth.  She attributed episodes to diet changes/trigger foods.    Had prev used mylanta prn.  Swallowing well.    She hasn't used/needed 30mg  diltiazem .    Sleep study d/w pt, pending for 06/02/24.  I'm tired of being tired.  Not yet on cymbalta .  D/w pt about holding med at this point, ie not to start right before the sleep study.    Meds, vitals, and allergies reviewed.   ROS: Per HPI unless specifically indicated in ROS section

## 2024-05-31 NOTE — Assessment & Plan Note (Signed)
 Discussed elevating the head of her bed.  She could use Prilosec if needed.  Continue famotidine .

## 2024-05-31 NOTE — Assessment & Plan Note (Signed)
 With sleep study pending and hopefully that will provide useful information.  I would not start Cymbalta  right before the sleep study, since she has not been taking it so far.  Discussed.

## 2024-06-01 ENCOUNTER — Telehealth: Payer: Self-pay | Admitting: Cardiology

## 2024-06-01 NOTE — Telephone Encounter (Signed)
 Pts husband would like to get some information to Dr. Shlomo before the pts appt tomorrow. Please advise

## 2024-06-01 NOTE — Telephone Encounter (Signed)
 Spoke with pt's husband, DPR who want to make sure Dr Shlomo is aware pt has an oral appliance for sleep apnea prescribed and managed by Dr Oneil Katz,DDS.  Pt is scheduled for CPAP titration 06/02/24 and Dr Micky is suggesting another split night study.  Pt's husband states pt has been waiting for sleep study for 2.5 months and they want to make sure what is ordered is correct. Advised will forward to Dr Dorine nurse and her sleep coordinator.  Pt's husband verbalizes understanding and agrees with current plan.

## 2024-06-02 ENCOUNTER — Ambulatory Visit (HOSPITAL_BASED_OUTPATIENT_CLINIC_OR_DEPARTMENT_OTHER): Attending: Internal Medicine | Admitting: Internal Medicine

## 2024-06-02 ENCOUNTER — Encounter (HOSPITAL_BASED_OUTPATIENT_CLINIC_OR_DEPARTMENT_OTHER): Payer: Self-pay

## 2024-06-02 ENCOUNTER — Ambulatory Visit (HOSPITAL_BASED_OUTPATIENT_CLINIC_OR_DEPARTMENT_OTHER): Admitting: Cardiology

## 2024-06-02 DIAGNOSIS — G4733 Obstructive sleep apnea (adult) (pediatric): Secondary | ICD-10-CM | POA: Diagnosis not present

## 2024-06-02 DIAGNOSIS — R0683 Snoring: Secondary | ICD-10-CM | POA: Insufficient documentation

## 2024-06-04 NOTE — Telephone Encounter (Signed)
 Our office did not schedule this:  Change the CPAP titration that is scheduled on 06/02/2024 to a SPLIT NIGHT SLEEP STUDY.  AFTER 4 HOURS, PATIENT IS TO SLEEP WITH HER ORAL APPLIANCE IN PLACE -- NOT USE THE CPAP Scheduled with Raven at [facility]

## 2024-06-12 ENCOUNTER — Telehealth: Payer: Self-pay

## 2024-06-12 NOTE — Telephone Encounter (Signed)
 Copied from CRM [id number]. Topic: Clinical - Lab/Test Results >> Jun 12, 2024 12:51 PM Rilla B wrote: Reason for CRM: Patient husband, Riva calling regarding sleep study on 7/01. Please call. They have questions.    Spoke w/ pt result not in   Verbalized understanding   NFN

## 2024-06-14 NOTE — Procedures (Signed)
 [facility] [address] [place], [place] [address] Tel: [phone]   Fax: [phone]  Polysomnography Interpretation  Patient Name:  [name], [name] Date:  06/02/2024 Referring Physician:  Dr. Reggy Salt  Indications for Polysomnography The patient is a 78 year old Female who is 5' 3 and weighs 142.0 lbs. Her BMI equals 24.8.  A full night polysomnogram was performed to evaluate for -OSA.  Medication taken at 2211.  0.5 Lorazepam    Polysomnogram Data A full night polysomnogram recorded the standard physiologic parameters including EEG, EOG, EMG, EKG, nasal and oral airflow.  Respiratory parameters of chest and abdominal movements were recorded with Respiratory Inductance Plethysmography belts.  Oxygen saturation was recorded by pulse oximetry.   Sleep Architecture The total recording time of the polysomnogram was 425.6 minutes.  The total sleep time was 267.5 minutes.  The patient spent 5.6% of total sleep time in Stage N1, 77.8% in Stage N2, 7.5% in Stages N3, and 9.2% in REM.  Sleep latency was 30.7 minutes.  REM latency was 159.5 minutes.  Sleep Efficiency was 62.9%.  Wake after Sleep Onset time was 127.0 minutes.  Respiratory Events The polysomnogram revealed a presence of - obstructive, - central, and - mixed apneas resulting in an Apnea index of - events per hour.  There were 21 hypopneas (>=3% desaturation and/or arousal) resulting in an Apnea\Hypopnea Index (AHI >=3% desaturation and/or arousal) of 4.7 events per hour.  There were 12 hypopneas (>=4% desaturation) resulting in an Apnea\Hypopnea Index (AHI >=4% desaturation) of 2.7 events per hour.  There were 15 Respiratory Effort Related Arousals resulting in a RERA index of 3.4 events per hour. The Respiratory Disturbance Index is 8.1 events per hour.  The snore index was 280.8 events per hour.  Mean oxygen saturation was 92.6%.  The lowest oxygen saturation during sleep was  84.0%.  Time spent <=88% oxygen saturation was 9.4 minutes (2.3%).  Limb Activity There were - total limb movements recorded, of this total, - were classified as PLMs.  PLM index was - per hour and PLM associated with Arousals index was - per hour.  Cardiac Summary The average pulse rate was 61.1 bpm.  The minimum pulse rate was 45.0 bpm while the maximum pulse rate was 107.0 bpm.  Cardiac rhythm was normal with occasional PACs.  Comment: Occasional apneas and hypopneas, within normal limits, AHI (4%) 2.7/hr. Snoring mainly while supine, with oxygen desaturation to a nadir of 84%, mean 92.6%. She did not need to wear her oral appliance. See technician comments below.   Diagnosis: Primary snoring  Recommendations: Encourage sleep position off back.   This study was personally reviewed and electronically signed by: Dr. Reggy Salt Accredited Board Certified in Sleep Medicine Date/Time: 06/14/24  1:06    Diagnostic PSG Report  Patient Name: [name] Date: 06/02/2024  Date of Birth: 03-17-1946 Study Type: Split Night  Age: 55 year MRN #: [id number]  Sex: Female Interpreting Physician: SALT REGGY, [id number]  Height: 5' 3 Referring Physician: Dr. Reggy Salt  Weight: 142.0 lbs Recording Tech: Dewane Hacker CRT RPSGT RST  BMI: 24.8 Scoring Tech: Dewane Hacker CRT RPSGT RST  ESS: 5 Neck Size: 13.5   Study Overview  Lights Off: 10:26:28 PM  Count Index  Lights On: 05:32:04 AM Awakenings: 19 4.3  Time in Bed: 425.6 min. Arousals: 42 9.4  Total Sleep Time: 267.5 min. AHI (>=3% Desat and/or Ar.): 21 4.7   Sleep Efficiency: 62.9%  AHI (>=4% Desat): 12 2.7   Sleep Latency: 30.7 min. Limb Movements: - -  Wake After Sleep Onset: 127.0 min. Snore: 1252 280.8  REM Latency from Sleep Onset: 159.5 min. Desaturations: 26 5.8     Minimum SpO2 TST: 84.0%    Sleep Architecture  % of Time in Bed Stages Time (mins) % Sleep Time  Wake 158.5   Stage N1 15.0 5.6%  Stage N2 208.0  77.8%  Stage N3 20.0 7.5%  REM 24.5 9.2%   Arousal Summary   NREM REM Sleep Index  Respiratory Arousals 21 - 21 4.7  PLM Arousals - - - -  Isolated Limb Movement Arousals - - - -  Snore Arousals 8 - 8 1.8  Spontaneous Arousals 13 - 13 2.9  Total 42 - 42 9.4   Limb Movement Summary   Count Index  Isolated Limb Movements - -  Periodic Limb Movements (PLMs) - -  Total Limb Movements - -    Respiratory Summary   By Sleep Stage By Body Position Total   NREM REM Supine Non-Supine   Time (min) 243.0 24.5 44.5 223.0 267.5         Obstructive Apnea - - - - -  Mixed Apnea - - - - -  Central Apnea - - - - -  Total Apneas - - - - -  Total Apnea Index - - - - -         Hypopneas (>=3% Desat and/or Ar.) 21 - 20 1 21   AHI (>=3% Desat and/or Ar.) 5.2 - 27.0 0.3 4.7         Hypopneas (>=4% Desat) 12 - 12 - 12  AHI (>=4% Desat) 3.0 - 16.2 - 2.7          RERAs 15 - 3 12 15   RERA Index 3.7 - 4.0 3.2 3.4         RDI 8.9 - 31.0 3.5 8.1    Respiratory Event Type Index  Central Apneas -  Obstructive Apneas -  Mixed Apneas -  Central Hypopneas -  Obstructive Hypopneas -  Central Apnea + Hypopnea (CAHI) -  Obstructive Apnea + Hypopnea (OAHI) 4.7   Respiratory Event Durations   Apnea Hypopnea   NREM REM NREM REM  Average (seconds) - - 33.7 -  Maximum (seconds) - - 63.2 -    Oxygen Saturation Summary   Wake NREM REM TST TIB  Average SpO2 (%) 93.5% 92.2% 92.1% 92.2% 92.6%  Minimum SpO2 (%) 89.0% 84.0% 90.0% 84.0% 84.0%  Maximum SpO2 (%) 99.0% 97.0% 94.0% 97.0% 99.0%   Oxygen Saturation Distribution  Range (%) Time in range (min) Time in range (%)  90.0 - 100.0 374.7 92.4%  80.0 - 90.0 30.7 7.6%  70.0 - 80.0 - -  60.0 - 70.0 - -  50.0 - 60.0 - -  0.0 - 50.0 - -  Time Spent <=88% SpO2  Range (%) Time in range (min) Time in range (%)  0.0 - 88.0 9.4 2.3%      Count Index  Desaturations 26 5.8    Cardiac Summary   Wake NREM REM Sleep Total  Average Pulse Rate  (BPM) 63.9 59.4 63.3 59.7 61.1  Minimum Pulse Rate (BPM) 45.0 50.0 60.0 50.0 45.0  Maximum Pulse Rate (BPM) 107.0 85.0 69.0 85.0 107.0   Pulse Rate Distribution:  Range (bpm) Time in range (min) Time in range (%)  0.0 - 40.0 - -  40.0 - 60.0 244.2 60.2%  60.0 - 80.0 145.6 35.9%  80.0 - 100.0 11.9 2.9%  100.0 - 120.0 1.6 0.4%  120.0 - 140.0 - -  140.0 - 200.0 - -      Hypnograms                      Technologist Comments  THE 78-YEAR-OLD FEMALE PATIENT PRESENTED TO THE SLEEP DISORDER CENTER FOR A SPLIT NIGHT STUDY WITH CHIEF COMPLAINT OF OSA. PER PHYSICIAN ORDER, THE PATIENT IS TO USE HER DENTAL DEVICE FOR TITRATION THERAPY IF SPLIT NIGHT CRITERIA IS MET. ALL BEDTIME MEDICATIONS WERE SELF ADMINISTERED AT 2211. THE PATIENT AND HUSBAND STATED THAT SHE USUALLY BREAKS HER LORAZEPAM  IN FOURTHS THROUGHOUT THE NIGHT, AS OPPOSED TO TAKING THE PRESCRIBED DOSAGE. THE STUDY WAS EXPLAINED, THE LEAD PLACEMENT WAS INITIATED AND THEN THE STUDY WAS BEGUN. MILD TO MODERATE AUDIBLE SNORING WAS NOTED THROUGHOUT THE STUDY, BUT PRIMARILY IN THE SUPINE POSITION. SUPPLEMENTAL OXYGEN WAS NOT WARRANTED DURING THE STUDY. NO PLMs - PLMAs WERE NOTED. THREE RESTROOM VISITS WERE MADE. AFTER THE THIRD RESTROOM VISIT THE PATIENT STATED SHE FELT ANXIOUS BECAUSE OF SUDDENLY AWAKING TO USE THE RESTROOM. THE PATIENT REQUESTED TO TAKE ANOTHER HALF OF LORAZEPAM . THE TECHNOLOGIST ADVISED AGAINST TAKING ANOTHER HALF OF LORAZEPAM  BECAUSE OF THE TIME, BUT WOULD LET HER TAKE A QUARTER IF SHE HAD DIFFICULTY RETURNING BACK TO SLEEP. IF RETURN STUDY IS WARRANTED THE PATIENT NEEDS A ROOM WITH A BATHROOM. QUESTIONABLE CARDIAC ARRHYTHMIAS WERE OBSERVED. PLEASE SEE ATTACHED SHEETS. NO OBVIOUS PARASOMNIAs WERE OBSERVED. SPLIT NIGHT CRITERIA WAS NOT MET DUE TO INSUFFICIENT AHI, THEREFORE A NPSG DIAGNOSTIC STUDY WAS CONTINUED. THE PATIENT WOULD BENEFIT FROM POSITIONAL SLEEP THERAPY, IF PATIENT IS NON-COMPLIANT WITH  USING HER ORAL DEVICE.  THE PATIENT TOLERATED THE NPSG STUDY WELL.                        Reggy Salt Diplomate, Biomedical engineer of Sleep Medicine  ELECTRONICALLY SIGNED ON:  06/14/2024, 12:59 PM [facility] PH: [phone]   FX: [phone] ACCREDITED BY THE AMERICAN ACADEMY OF SLEEP MEDICINE

## 2024-06-16 ENCOUNTER — Other Ambulatory Visit: Payer: Self-pay | Admitting: Family Medicine

## 2024-06-16 ENCOUNTER — Telehealth: Payer: Self-pay

## 2024-06-16 NOTE — Telephone Encounter (Signed)
 Sleep study results- ok to communicate with husband- [name] had a few apneas and hypopneas, within normal limits. She was not wearing CPAP or an oral appliance. Her oxygen levels were OK. I do suggest she try to sleep off the flat of her back to reduce snoring.

## 2024-06-16 NOTE — Telephone Encounter (Signed)
 Copied from CRM [id number]. Topic: General - Other >> Jun 15, 2024  2:31 PM Shona S wrote: Reason for CRM: patient husband is calling because he called Friday in regards to a message they were tryinng to get over to dr young, he says his wife can't understand what was being told to her and that we would need to call him, message was sent but didn't have the request of getting in contact wit husband, his phone number is [phone]      Called patients husband (on [place]).  Results are back but Dr. Neysa has not read results.  Infomed patient will send information to Dr. Neysa that results are ready.  Will call patient when Dr. Neysa reads sleep study.  Patients husband wants us  to call him at [phone].

## 2024-06-17 DIAGNOSIS — H34811 Central retinal vein occlusion, right eye, with macular edema: Secondary | ICD-10-CM | POA: Diagnosis not present

## 2024-06-18 NOTE — Telephone Encounter (Signed)
 Called patient, spoke with her husband, Riva. (On DPR).  Gave information per Dr. Neysa.  Patient's husband will relay information to patient.  He states that his wife already sleeps on her right side and if she rolls onto her back, he wakes her up and makes he move back to her side position.  Husband verbalized understanding.  Patient's husband wants to ask Dr. Neysa if he has any suggestions as to why patient is having so much fatigue during the day if sleep study was within normal limits.  Where does patient go from here?  Dr. Neysa, please advise.

## 2024-06-19 ENCOUNTER — Encounter: Payer: Self-pay | Admitting: Family Medicine

## 2024-06-19 ENCOUNTER — Ambulatory Visit (INDEPENDENT_AMBULATORY_CARE_PROVIDER_SITE_OTHER): Admitting: Family Medicine

## 2024-06-19 VITALS — BP 132/62 | HR 71 | Temp 98.8°F | Ht 63.0 in | Wt 137.6 lb

## 2024-06-19 DIAGNOSIS — K219 Gastro-esophageal reflux disease without esophagitis: Secondary | ICD-10-CM | POA: Diagnosis not present

## 2024-06-19 DIAGNOSIS — F419 Anxiety disorder, unspecified: Secondary | ICD-10-CM | POA: Diagnosis not present

## 2024-06-19 NOTE — Telephone Encounter (Signed)
 Called patient.  Spoke with patient's husband (on [place]).  Patient states she cannot take caffeine due to her pacemaker, she cannot take naps during the afternoon as she has tried and cannot fall asleep during the day.  She does not think she is getting enough sleep at night and she saw her PCP today, Dr. Cleatus.  Patient does not take her lorazepam  except very infrequently.  Dr. Cleatus suggested patient get on a regular schedule of taking her lorazepam  for her anxiety and she may sleep better at night and also take naps during the day.  Can Dr. Neysa refer patient to a neurologist and also let her know exactly what a neurologist can do for her sleep problems and the chronic daytime fatigue.

## 2024-06-19 NOTE — Addendum Note (Signed)
 Addended by: VICCI SELLER A on: 06/19/2024 03:08 PM   Modules accepted: Orders

## 2024-06-19 NOTE — Patient Instructions (Signed)
 I would try taking 1/2 tab of lorazepam  at night and then a 2nd dose of 1/2 tab in the middle of the night if needed.   I would try that for 1-2 weeks.   Then try cymbalta .   I would get set up with counseling.   If you have heartburn, then I would take prilosec.   Take care.  Glad to see you.

## 2024-06-19 NOTE — Progress Notes (Signed)
 Taking mylanta prn.  No PPI use.  D/w pt about use.  Intermittent GERD sx.    D/w pt about OSA eval and goal for side sleeping.  She is sleeping for about a few hours then waking.  Having nightmares, waking from that.   She had been taking fragments of lorazepam  at night but not overusing med.    [facility]  We talked about starting Cymbalta . Discussed depression/fatigue.    Meds, vitals, and allergies reviewed.   ROS: Per HPI unless specifically indicated in ROS section   Nad Ncat Neck supple, no LA Rrr Ctab Skin well-perfused.

## 2024-06-19 NOTE — Telephone Encounter (Signed)
 If she is getting enough sleep time in bed at night, she can take a short nap after lunch. She can try an otc caffeine tablet once or twice daily. If these don't help then it may be helpful to see a Neurologist.

## 2024-06-19 NOTE — Progress Notes (Signed)
 Remote pacemaker transmission.

## 2024-06-19 NOTE — Telephone Encounter (Signed)
Pt notified on VM. 

## 2024-06-20 NOTE — Assessment & Plan Note (Signed)
 If having heartburn then I would try taking Prilosec.  Discussed.

## 2024-06-20 NOTE — Assessment & Plan Note (Signed)
 I would try taking 1/2 tab of lorazepam  at night and then a 2nd dose of 1/2 tab in the middle of the night if needed.   I would try that for 1-2 weeks.   Then try cymbalta .  Encouraged counseling.

## 2024-06-24 ENCOUNTER — Other Ambulatory Visit: Payer: Self-pay

## 2024-06-24 DIAGNOSIS — G4710 Hypersomnia, unspecified: Secondary | ICD-10-CM

## 2024-06-24 NOTE — Progress Notes (Signed)
 Per Dr. Neysa, refer to Dr. Dedra Dohmeier for excessive somnolence.  Spoke with patient and her hustand.  Gave all information.  Patient verbalized understanding.

## 2024-06-24 NOTE — Telephone Encounter (Signed)
 Suggest we refer to Dr Dedra Nian, Neurologist for complaint of excessive somnolence, second opinion.

## 2024-06-24 NOTE — Telephone Encounter (Signed)
 Called patient.  Spoke with Riva (patients husband, on [place]).  Gave all information regarding referral to Dr. Chalice.  Will place orders only in EPIC.  Patient and husband verbalized understanding.

## 2024-06-29 ENCOUNTER — Telehealth: Payer: Self-pay

## 2024-06-29 NOTE — Telephone Encounter (Signed)
 Copied from CRM [id number]. Topic: Clinical - Lab/Test Results >> Jun 25, 2024 10:06 AM Rilla NOVAK wrote: Reason for CRM: Dr Oneil Forget office calling for a copy of the sleep results. Phone: [phone]....  FAX:  [phone].     ----------------------------------------------------------------------- From previous Reason for Contact - Other: Reason for CRM:

## 2024-06-30 NOTE — Telephone Encounter (Signed)
 Here are the sleep test results per Dr. Neysa:  Neysa Reggy BIRCH, MD    06/16/24  4:29 PM Note Sleep study results- ok to communicate with husband- Mrs Cherian had a few apneas and hypopneas, within normal limits. She was not wearing CPAP or an oral appliance. Her oxygen levels were OK. I do suggest she try to sleep off the flat of her back to reduce snoring.       Called Dr. Oneil Forget office.  Spoke with Shona.  Faxed copy of Split night sleep study to their office per her request.  NFN.

## 2024-07-03 ENCOUNTER — Ambulatory Visit (INDEPENDENT_AMBULATORY_CARE_PROVIDER_SITE_OTHER): Admitting: Family Medicine

## 2024-07-03 ENCOUNTER — Encounter: Payer: Self-pay | Admitting: Family Medicine

## 2024-07-03 VITALS — BP 138/86 | HR 72 | Temp 98.6°F | Ht 63.0 in | Wt 139.4 lb

## 2024-07-03 DIAGNOSIS — K219 Gastro-esophageal reflux disease without esophagitis: Secondary | ICD-10-CM | POA: Diagnosis not present

## 2024-07-03 DIAGNOSIS — R5383 Other fatigue: Secondary | ICD-10-CM | POA: Diagnosis not present

## 2024-07-03 NOTE — Progress Notes (Signed)
 She is taking famotidine  twice a day. Not yet on PPI.  D/w pt about use.  Still having GERD sx, esp at night.  Not every night but often.  Burning in the chest when supine, her tongue can get irritated.  Had used mylanta PRN.  No blood in stools. No black stools. D/w pt about elevating the head of the bed.    D/w pt about starting cymbalta .  D/w pt about dosing lorazepam .  D/w pt about counseling.   She has not started Cymbalta .  She has pending neurology referral, discussed.  Meds, vitals, and allergies reviewed.   ROS: Per HPI unless specifically indicated in ROS section   GEN: nad, alert and oriented HEENT: ncat NECK: supple w/o LA CV: rrr PULM: ctab, no inc wob ABD: soft, +bs EXT: no edema SKIN: Well-perfused.

## 2024-07-03 NOTE — Patient Instructions (Addendum)
 You could take prilosec prior to bedtime and still take famotidine  twice a day.  Take care.  Glad to see you. Please check with neurology if you don't get a call from them.

## 2024-07-05 NOTE — Assessment & Plan Note (Signed)
 Has neurology follow-up pending.  Discussed either starting Cymbalta  or starting Prilosec.  She opted for Prilosec start so defer Cymbalta  start for now.

## 2024-07-05 NOTE — Assessment & Plan Note (Signed)
 She could take prilosec prior to bedtime and still take famotidine  twice a day.  Discussed elevate the head of her bed.

## 2024-07-20 ENCOUNTER — Encounter: Payer: Self-pay | Admitting: Family Medicine

## 2024-07-22 ENCOUNTER — Telehealth: Payer: Self-pay | Admitting: Family Medicine

## 2024-07-22 NOTE — Telephone Encounter (Signed)
 Dr. Neysa,  I got a message from the patient's husband stating that she is not going to be able to follow-up with Dr. Chalice regarding sleep apnea.  They contacted me but I wanted your input.  What guidance can you provide regarding sleep apnea at this point?  Many thanks.

## 2024-07-23 NOTE — Telephone Encounter (Signed)
 Thank you.  Do you have any other advice re: referral or treatment for excessive somnolence?

## 2024-07-23 NOTE — Telephone Encounter (Signed)
 She did not demonstrate obstructive sleep apnea on her sleep study in July. She was not wearing her oral appliance for that study. We can see her again at [facility] if she needs us , but it looks as if she can just follow with her primary physician for routine care.

## 2024-07-23 NOTE — Telephone Encounter (Signed)
 It can be worth a try with a stimulant like adderall 10 mg, once or twice daily, modafinil 100 mg, once daily, attention to sleep hygiene- adequate sleep at bedtime, short 20-30 minute nap around lunch time. Sometimes an otc caffeine tab( NoDoz, Vivarin) 1/2 or 1 tab, once or twice daily can work well.

## 2024-07-23 NOTE — Telephone Encounter (Signed)
 Many thanks.  Will d/w pt at upcoming OV.

## 2024-07-24 ENCOUNTER — Ambulatory Visit (INDEPENDENT_AMBULATORY_CARE_PROVIDER_SITE_OTHER): Admitting: Family Medicine

## 2024-07-24 ENCOUNTER — Encounter: Payer: Self-pay | Admitting: Family Medicine

## 2024-07-24 VITALS — BP 126/82 | HR 74 | Temp 98.6°F | Ht 63.0 in | Wt 139.6 lb

## 2024-07-24 DIAGNOSIS — R5383 Other fatigue: Secondary | ICD-10-CM | POA: Diagnosis not present

## 2024-07-24 DIAGNOSIS — K219 Gastro-esophageal reflux disease without esophagitis: Secondary | ICD-10-CM | POA: Diagnosis not present

## 2024-07-24 NOTE — Progress Notes (Signed)
 D/w pt about contact from Dr. Neysa.  Discussed options adderall 10 mg, once or twice daily, vs modafinil 100 mg.   Discussed caffeine.   Discussed cymbalta .  Discussed tertiary care referral.  Unclear if her fatigue is related to her mood or her lower mood is related to fatigue.  She is still exhausted.  She has taken prilosec for the last 12 days.  Generally the heartburn at night is less often.    Taking ativan  prn.  Not yet on cymbalta .   Discussed that I could update Dr. Verlin.    Meds, vitals, and allergies reviewed.   ROS: Per HPI unless specifically indicated in ROS section    GEN: nad, alert and oriented HEENT: ncat NECK: supple w/o LA CV: rrr PULM: ctab, no inc wob ABD: soft, +bs EXT: no edema SKIN: Well-perfused.

## 2024-07-24 NOTE — Patient Instructions (Addendum)
 I would keep taking prilosec and famotidine  for heartburn.   I would try adding on cymbalta  once a day to see if that helps with fatigue and mood.   I'll update Dr. Verlin.   Take care.  Glad to see you.

## 2024-07-26 NOTE — Assessment & Plan Note (Signed)
 I would try adding on cymbalta  once a day to see if that helps with fatigue and mood.   I would still continue diltiazem  as is.  She has been on that stable dose and I do not think that is contributing to her fatigue.  I will route this note to cardiology as FYI, though I do not think her cardiac status contributes to her fatigue.

## 2024-07-26 NOTE — Assessment & Plan Note (Signed)
 Would continue famotidine  with Prilosec.  Discussed.

## 2024-07-27 ENCOUNTER — Inpatient Hospital Stay (HOSPITAL_COMMUNITY)
Admission: EM | Admit: 2024-07-27 | Discharge: 2024-07-31 | DRG: 641 | Disposition: A | Discharge status: Home Health | Attending: Internal Medicine | Admitting: Internal Medicine

## 2024-07-27 ENCOUNTER — Other Ambulatory Visit: Payer: Self-pay

## 2024-07-27 ENCOUNTER — Emergency Department (HOSPITAL_COMMUNITY)

## 2024-07-27 ENCOUNTER — Ambulatory Visit: Payer: Self-pay

## 2024-07-27 DIAGNOSIS — Z833 Family history of diabetes mellitus: Secondary | ICD-10-CM

## 2024-07-27 DIAGNOSIS — G4733 Obstructive sleep apnea (adult) (pediatric): Secondary | ICD-10-CM | POA: Diagnosis not present

## 2024-07-27 DIAGNOSIS — Z85828 Personal history of other malignant neoplasm of skin: Secondary | ICD-10-CM | POA: Diagnosis not present

## 2024-07-27 DIAGNOSIS — Z818 Family history of other mental and behavioral disorders: Secondary | ICD-10-CM

## 2024-07-27 DIAGNOSIS — E890 Postprocedural hypothyroidism: Secondary | ICD-10-CM | POA: Diagnosis not present

## 2024-07-27 DIAGNOSIS — Z881 Allergy status to other antibiotic agents status: Secondary | ICD-10-CM

## 2024-07-27 DIAGNOSIS — E7800 Pure hypercholesterolemia, unspecified: Secondary | ICD-10-CM | POA: Diagnosis present

## 2024-07-27 DIAGNOSIS — R5382 Chronic fatigue, unspecified: Secondary | ICD-10-CM | POA: Diagnosis present

## 2024-07-27 DIAGNOSIS — Z888 Allergy status to other drugs, medicaments and biological substances status: Secondary | ICD-10-CM

## 2024-07-27 DIAGNOSIS — R630 Anorexia: Secondary | ICD-10-CM | POA: Diagnosis present

## 2024-07-27 DIAGNOSIS — Z950 Presence of cardiac pacemaker: Secondary | ICD-10-CM | POA: Diagnosis not present

## 2024-07-27 DIAGNOSIS — I10 Essential (primary) hypertension: Secondary | ICD-10-CM | POA: Diagnosis not present

## 2024-07-27 DIAGNOSIS — Z8041 Family history of malignant neoplasm of ovary: Secondary | ICD-10-CM

## 2024-07-27 DIAGNOSIS — E876 Hypokalemia: Secondary | ICD-10-CM | POA: Diagnosis present

## 2024-07-27 DIAGNOSIS — Z803 Family history of malignant neoplasm of breast: Secondary | ICD-10-CM | POA: Diagnosis not present

## 2024-07-27 DIAGNOSIS — Z8249 Family history of ischemic heart disease and other diseases of the circulatory system: Secondary | ICD-10-CM

## 2024-07-27 DIAGNOSIS — Z8261 Family history of arthritis: Secondary | ICD-10-CM

## 2024-07-27 DIAGNOSIS — F32A Depression, unspecified: Secondary | ICD-10-CM | POA: Diagnosis present

## 2024-07-27 DIAGNOSIS — Z6824 Body mass index (BMI) 24.0-24.9, adult: Secondary | ICD-10-CM

## 2024-07-27 DIAGNOSIS — Z83438 Family history of other disorder of lipoprotein metabolism and other lipidemia: Secondary | ICD-10-CM

## 2024-07-27 DIAGNOSIS — I495 Sick sinus syndrome: Secondary | ICD-10-CM | POA: Diagnosis not present

## 2024-07-27 DIAGNOSIS — K9049 Malabsorption due to intolerance, not elsewhere classified: Secondary | ICD-10-CM | POA: Diagnosis not present

## 2024-07-27 DIAGNOSIS — R531 Weakness: Secondary | ICD-10-CM | POA: Diagnosis not present

## 2024-07-27 DIAGNOSIS — Z885 Allergy status to narcotic agent status: Secondary | ICD-10-CM

## 2024-07-27 DIAGNOSIS — Z90710 Acquired absence of both cervix and uterus: Secondary | ICD-10-CM

## 2024-07-27 DIAGNOSIS — T43215A Adverse effect of selective serotonin and norepinephrine reuptake inhibitors, initial encounter: Secondary | ICD-10-CM | POA: Diagnosis not present

## 2024-07-27 DIAGNOSIS — Z884 Allergy status to anesthetic agent status: Secondary | ICD-10-CM

## 2024-07-27 DIAGNOSIS — Z79899 Other long term (current) drug therapy: Secondary | ICD-10-CM | POA: Diagnosis not present

## 2024-07-27 DIAGNOSIS — Z83719 Family history of colon polyps, unspecified: Secondary | ICD-10-CM

## 2024-07-27 DIAGNOSIS — E871 Hypo-osmolality and hyponatremia: Secondary | ICD-10-CM | POA: Diagnosis not present

## 2024-07-27 DIAGNOSIS — K2100 Gastro-esophageal reflux disease with esophagitis, without bleeding: Secondary | ICD-10-CM | POA: Diagnosis not present

## 2024-07-27 DIAGNOSIS — Z8616 Personal history of COVID-19: Secondary | ICD-10-CM

## 2024-07-27 DIAGNOSIS — W19XXXA Unspecified fall, initial encounter: Secondary | ICD-10-CM

## 2024-07-27 DIAGNOSIS — F419 Anxiety disorder, unspecified: Secondary | ICD-10-CM | POA: Diagnosis present

## 2024-07-27 DIAGNOSIS — G9389 Other specified disorders of brain: Secondary | ICD-10-CM | POA: Diagnosis not present

## 2024-07-27 DIAGNOSIS — M810 Age-related osteoporosis without current pathological fracture: Secondary | ICD-10-CM | POA: Diagnosis present

## 2024-07-27 DIAGNOSIS — Z96651 Presence of right artificial knee joint: Secondary | ICD-10-CM | POA: Diagnosis present

## 2024-07-27 DIAGNOSIS — I6523 Occlusion and stenosis of bilateral carotid arteries: Secondary | ICD-10-CM | POA: Diagnosis not present

## 2024-07-27 DIAGNOSIS — R296 Repeated falls: Secondary | ICD-10-CM | POA: Diagnosis present

## 2024-07-27 DIAGNOSIS — Z860101 Personal history of adenomatous and serrated colon polyps: Secondary | ICD-10-CM

## 2024-07-27 DIAGNOSIS — Z880 Allergy status to penicillin: Secondary | ICD-10-CM

## 2024-07-27 DIAGNOSIS — R413 Other amnesia: Secondary | ICD-10-CM | POA: Diagnosis present

## 2024-07-27 LAB — URINALYSIS, W/ REFLEX TO CULTURE (INFECTION SUSPECTED)
Bacteria, UA: NONE SEEN
Bilirubin Urine: NEGATIVE
Glucose, UA: 50 mg/dL — AB
Ketones, ur: NEGATIVE mg/dL
Leukocytes,Ua: NEGATIVE
Nitrite: NEGATIVE
Protein, ur: NEGATIVE mg/dL
Specific Gravity, Urine: 1.009 (ref 1.005–1.030)
pH: 5 (ref 5.0–8.0)

## 2024-07-27 LAB — CBC WITH DIFFERENTIAL/PLATELET
Abs Immature Granulocytes: 0.03 K/uL (ref 0.00–0.07)
Basophils Absolute: 0 K/uL (ref 0.0–0.1)
Basophils Relative: 0 %
Eosinophils Absolute: 0 K/uL (ref 0.0–0.5)
Eosinophils Relative: 0 %
HCT: 40.7 % (ref 36.0–46.0)
Hemoglobin: 15.1 g/dL — ABNORMAL HIGH (ref 12.0–15.0)
Immature Granulocytes: 0 %
Lymphocytes Relative: 13 %
Lymphs Abs: 0.9 K/uL (ref 0.7–4.0)
MCH: 32.3 pg (ref 26.0–34.0)
MCHC: 37.1 g/dL — ABNORMAL HIGH (ref 30.0–36.0)
MCV: 87 fL (ref 80.0–100.0)
Monocytes Absolute: 0.9 K/uL (ref 0.1–1.0)
Monocytes Relative: 13 %
Neutro Abs: 5.2 K/uL (ref 1.7–7.7)
Neutrophils Relative %: 74 %
Platelets: 241 K/uL (ref 150–400)
RBC: 4.68 MIL/uL (ref 3.87–5.11)
RDW: 11.9 % (ref 11.5–15.5)
WBC: 7.1 K/uL (ref 4.0–10.5)
nRBC: 0 % (ref 0.0–0.2)

## 2024-07-27 LAB — COMPREHENSIVE METABOLIC PANEL WITH GFR
ALT: 19 U/L (ref 0–44)
AST: 22 U/L (ref 15–41)
Albumin: 3.9 g/dL (ref 3.5–5.0)
Alkaline Phosphatase: 54 U/L (ref 38–126)
Anion gap: 14 (ref 5–15)
BUN: 16 mg/dL (ref 8–23)
CO2: 20 mmol/L — ABNORMAL LOW (ref 22–32)
Calcium: 8.8 mg/dL — ABNORMAL LOW (ref 8.9–10.3)
Chloride: 81 mmol/L — ABNORMAL LOW (ref 98–111)
Creatinine, Ser: 0.98 mg/dL (ref 0.44–1.00)
GFR, Estimated: 60 mL/min — ABNORMAL LOW (ref >60)
Glucose, Bld: 116 mg/dL — ABNORMAL HIGH (ref 70–99)
Potassium: 3.6 mmol/L (ref 3.5–5.1)
Sodium: 115 mmol/L — CL (ref 135–145)
Total Bilirubin: 0.9 mg/dL (ref 0.0–1.2)
Total Protein: 6.8 g/dL (ref 6.5–8.1)

## 2024-07-27 LAB — OSMOLALITY: Osmolality: 249 mosm/kg — CL (ref 275–295)

## 2024-07-27 LAB — TSH: TSH: 2.384 u[IU]/mL (ref 0.350–4.500)

## 2024-07-27 LAB — ETHANOL: Alcohol, Ethyl (B): <15 mg/dL (ref <15)

## 2024-07-27 LAB — RAPID URINE DRUG SCREEN, HOSP PERFORMED
Amphetamines: NOT DETECTED
Barbiturates: NOT DETECTED
Benzodiazepines: POSITIVE — AB
Cocaine: NOT DETECTED
Opiates: NOT DETECTED
Tetrahydrocannabinol: NOT DETECTED

## 2024-07-27 LAB — OSMOLALITY, URINE: Osmolality, Ur: 306 mosm/kg (ref 300–900)

## 2024-07-27 LAB — SODIUM, URINE, RANDOM: Sodium, Ur: <10 mmol/L

## 2024-07-27 LAB — SODIUM: Sodium: 119 mmol/L — CL (ref 135–145)

## 2024-07-27 MED ORDER — SODIUM CHLORIDE 3 % IV SOLN
INTRAVENOUS | Status: DC
Start: 2024-07-27 — End: 2024-07-27

## 2024-07-27 MED ORDER — SODIUM CHLORIDE 3 % IV BOLUS
100.0000 mL | Freq: Once | INTRAVENOUS | Status: AC
  Administered 2024-07-27: 100 mL via INTRAVENOUS
  Filled 2024-07-27: qty 500

## 2024-07-27 MED ORDER — SODIUM CHLORIDE 0.9 % IV BOLUS
500.0000 mL | Freq: Once | INTRAVENOUS | Status: AC
  Administered 2024-07-27: 500 mL via INTRAVENOUS

## 2024-07-27 NOTE — ED Notes (Signed)
 Unable to provide a urine sample right now but has a cup and is aware that we need one.

## 2024-07-27 NOTE — ED Notes (Addendum)
 Pt states she has been stumbling when trying to ambulate. 3 days ago pt fell at home onto her bottom, denies injury or striking her head.  This morning @ 0600 pt fell in the bathroom across the top this morning when trying to use the toilet. Pt states she doesn't think she hit her head.  Pt's husband asked for this writer not to start an IV at this time until there was an order placed by EDP.

## 2024-07-27 NOTE — ED Notes (Signed)
 Pharmacy notified of hypertonic orders placed for verification.

## 2024-07-27 NOTE — ED Provider Triage Note (Signed)
 Emergency Medicine Provider Triage Evaluation Note  [name] , a 78 y.o. female  was evaluated in triage.  Pt complains of possible memory loss.  Review of Systems   BP (!) 146/101 (BP Location: Right Arm)   Pulse 91   Temp 98.8 F (37.1 C)   Resp 17   Ht 5' 3 (1.6 m)   Wt 63 kg   SpO2 97%   BMI 24.60 kg/m  Awake and appears confused.  Medical Decision Making  Medically screening exam initiated at 6:16 PM.  Appropriate orders placed.  [name] was informed that the remainder of the evaluation will be completed by another provider, this initial triage assessment does not replace that evaluation, and the importance of remaining in the ED until their evaluation is complete.  Patient unsure why she is here what happened.  Really cannot provide much history.  Reportedly sent in for memory loss.   [name] Lot, MD 07/27/24 [phone]

## 2024-07-27 NOTE — ED Triage Notes (Addendum)
 Family called EMS for memory problems and intermittent ambulation problems that started over the weekend. Had a fall on Sunday but no injuries.   Alert to self and place but not the year. Otherwise able to converse with me appropriately.   154/89 96% 77 Glucose 130

## 2024-07-27 NOTE — Telephone Encounter (Addendum)
  FYI Only or Action Required?: Action required by provider: update on patient condition.  Patient was last seen in primary care on 07/24/2024 by Cleatus Arlyss RAMAN, MD.  Called Nurse Triage reporting Balance issue and Fatigue.  Symptoms began several days ago.  Interventions attempted: Rest, hydration, or home remedies.  Symptoms are: rapidly worsening.  Triage Disposition: Go to ED Now (or PCP Triage)  Patient/caregiver understands and will follow disposition?: Yes  1st attempt. Phone is busy signal.   Message from F[place]on H sent at 07/27/2024  1:39 PM EDT  Summary: chronic igue   Reason for Triage: chronic fatigue,losing balance

## 2024-07-27 NOTE — ED Notes (Signed)
 Provider at bedside

## 2024-07-27 NOTE — Telephone Encounter (Addendum)
 Nat RN with [facility]  called with pts husband on phone. Starting on 07/25/24 in the evening; pt lost balance and fell hitting Lt elbow on chair;per Mr Hannig pt had some pain lt elbow on 07/25/24 but  not much pain now and no bruising or visible sign of injury and pt can use lt arm and elbow normally. Pt fell again on 07/26/24 by losing balance like pts upper body was top heavy. No H/A,CP,SOB, dizziness, lightheadedness and no vision changes. Pt has no new brain fog and no speech issues. The new symptoms are being off balance, falling and starting on 07/26/24 pt is shuffling her feet rather than stepping normally; pt also complains of weakness in both arms. Also noted that pt may be urinating more frequently with slight burning sensation when urinates but pt drinking a lot of water. On 07/25/24 BP 157/105 P 76.  On 07/26/24 BP 145/93 P 58 with reck 127/78 P 58. Now on 07/27/24 BP 149/96 P 64. Spoke with Dr Cleatus  and with pt having new symptoms without obvious reasons Dr Cleatus thinks pt should be evaluated at ED. Pt husband voiced understanding and will call 911 to take pt to [facility] ED now. Sending note to Dr Cleatus.

## 2024-07-27 NOTE — Consult Note (Signed)
 [name] Admit Date: 07/27/2024 07/27/2024 [name] Requesting Physician:  Tegeler MD  Reason for Consult:  Hyponatremia, falls HPI:  78F PMH including bradycardia s/p PPM, chronic mild hyponatremia, GERD recently started on PPI, chronic fatigue with recent initiation of Cymbalta , pretension on diltiazem  who has been working with PCP regarding this chronic fatigue and started on Cymbalta  thinking it might be connected to depression this Friday.  She took 1 or 2 doses and then noticed some tumbling, tripping, and 2 falls at home.  After discussion here today with PCP she presented to the ED for further evaluation.  She has been eating well, no diarrhea.  No significant dyspnea, peripheral edema, exertional symptoms.  In the ED her sodium was 115, BUN 16, K3.6.  Baseline serum sodium as of June of this year was 132 and it ranges from 128-135.  She is oriented x3 without asterixis on exam.  She has normal GFR.  Other labs in the ED have included a urine analysis with the specific gravity of 1.009.  TSH in February of this year was 1.8.  ] ROS Balance of 12 systems is negative w/ exceptions as above  PMH  Past Medical History:  Diagnosis Date   Adenomatous colon polyp 12/2006   Allergic rhinitis    Anemia    Anxiety    Appendicitis 04/2020   Arthritis    Bradycardia    s/p MDT PPM   Cataract    bilateral   Clotting disorder (HCC) September 26, 2017   Depression    Esophageal stricture    Family history of adverse reaction to anesthesia    maternal uncle with MH, nephew had MH during surgery and was OK, pt not been tested   GERD (gastroesophageal reflux disease)    Heart murmur    Hemorrhoids    Hyperlipidemia    Hypertension    Inguinal hernia    Osteoporosis    Presence of permanent cardiac pacemaker 2006   SSS   PVC (premature ventricular contraction)    Scleritis of right eye    Skin cancer    Basal and squamous cell   Sleep apnea    a. intolerant of CPAP, does  wear a mouthpiece   Subdural hematoma (HCC)    a. spontanous 09/2017 b. resolved without intervention   Thyroid  disease    PSH  Past Surgical History:  Procedure Laterality Date   ABDOMINAL HYSTERECTOMY     APPENDECTOMY     BREAST BIOPSY Left 01/15/2014   BREAST BIOPSY Right 06/25/2023   US  RT BREAST BX W LOC DEV 1ST LESION IMG BX SPEC US  GUIDE 06/25/2023 [facility]   BREAST SURGERY     Biopsy-benign   CESAREAN SECTION     COLONOSCOPY  2018   DILATION AND CURETTAGE OF UTERUS     ESOPHAGOGASTRODUODENOSCOPY     esophageal stretching   EYE SURGERY     HAND SURGERY  1992   HERNIA REPAIR     INGUINAL HERNIA REPAIR  02/12/1994   left inguinal hernia repair   JOINT REPLACEMENT  12/21/2014   KNEE ARTHROSCOPY  2011   LAPAROSCOPIC APPENDECTOMY N/A 11/29/2020   Procedure: APPENDECTOMY LAPAROSCOPIC;  Surgeon: Paola Dreama SAILOR, MD;  Location: [facility];  Service: General;  Laterality: N/A;   OOPHORECTOMY  1995   LSO and RSO   PACEMAKER GENERATOR CHANGE N/A 01/21/2015   Procedure: PACEMAKER GENERATOR CHANGE;  Surgeon: Lynwood Rakers, MD;  Location: [facility];  Service: Cardiovascular;  Laterality: N/A;   PACEMAKER INSERTION  2006   Dr. Ellin   POLYPECTOMY     Tear duct surgery  2010   THUMB SURGERY     THYROIDECTOMY  1979   RIGHT SIDE   TOTAL KNEE ARTHROPLASTY Right 12/20/2014   Procedure: RIGHT TOTAL KNEE ARTHROPLASTY;  Surgeon: Marcey Raman, MD;  Location: [facility] OR;  Service: Orthopedics;  Laterality: Right;   UPPER GASTROINTESTINAL ENDOSCOPY     unsure   FH  Family History  Problem Relation Age of Onset   Heart failure Sister    Heart disease Sister    Hypertension Sister    Colon polyps Sister    Arthritis Sister    Depression Sister    Dementia Mother    Hypertension Mother    Colon polyps Mother    Anxiety disorder Mother    Hypertension Father    Arthritis Father    Hyperlipidemia Sister    Breast cancer Maternal Aunt        Age 8's    Diabetes Maternal Aunt    Diabetes Maternal Uncle    Ovarian cancer Maternal Aunt    Colon cancer Neg Hx    Esophageal cancer Neg Hx    Stomach cancer Neg Hx    Rectal cancer Neg Hx    SH  reports that she has never smoked. She has never used smokeless tobacco. She reports that she does not drink alcohol  and does not use drugs. Allergies  Allergies  Allergen Reactions   Hydrocodone     Increased HR   Antihistamines, Loratadine-Type Other (See Comments)    Heart races/jittery   Cephalexin Nausea And Vomiting   Citalopram     intolerant   Codeine Other (See Comments)    Increased heart rate, anxiety   Demerol [Meperidine] Other (See Comments)    Increased heart rate, anxiety   Epinephrine  Other (See Comments)    Increased heart rate   Erythromycin Nausea And Vomiting   Hydrochlorothiazide      Low sodium   Hyoscyamine  Sulfate Other (See Comments)    Pt could not tolerate this med UNKNOWN reaction   Inderal [Propranolol] Other (See Comments)    Extreme tiredness   Lidocaine  Other (See Comments)    Family hx of malignant hyperthermia   Lipitor [Atorvastatin] Other (See Comments)    Muscle aches   Losartan Potassium Other (See Comments)    Pt could not tolerate this med UNKNOWN REACTION   Morphine Other (See Comments)    Nightmares, increased BP   Nebivolol Other (See Comments)    angioedema   Paxil [Paroxetine Hcl] Other (See Comments)    Intolerant, mood changes.    Penicillins Nausea And Vomiting   Prednisone Other (See Comments)    Tolerates low doses if absolutely necessary - makes her nervous, jittery   Propranolol Hcl Other (See Comments)    Pt could not tolerate this med UNKNOWN REACTION   Sertraline Hcl Other (See Comments)    Pt could not tolerate this med UNKNOWN REACTION   Tetracycline Nausea And Vomiting   Toprol  Xl [Metoprolol  Succinate] Other (See Comments)    Extreme tiredness   Verapamil Other (See Comments)    Pt could not tolerate this  med UNKNOWN REACTION   Hydralazine      headaches   Oxycodone -Acetaminophen  Palpitations    Causes accelerated heart rate   Home medications Prior to Admission medications   Medication Sig Start Date End Date Taking? Authorizing Provider  acetaminophen  (TYLENOL ) 500 MG tablet Take 1,000 mg by mouth every 6 (six) hours as needed (pain).    [provider]  diltiazem  (CARDIZEM  CD) 120 MG 24 hr capsule Take 1 capsule (120 mg total) by mouth daily. 10/22/23   Cleatus Arlyss RAMAN, MD  diltiazem  (CARDIZEM ) 30 MG tablet Take 1 tablet (30 mg total) by mouth daily as needed. 05/04/24   Verlin Lonni BIRCH, MD  DULoxetine  (CYMBALTA ) 30 MG capsule Take 1 capsule (30 mg total) by mouth daily. 02/02/24   Cleatus Arlyss RAMAN, MD  famotidine  (PEPCID ) 20 MG tablet Take 1 tablet by mouth twice daily 06/16/24   Cleatus Arlyss RAMAN, MD  LORazepam  (ATIVAN ) 1 MG tablet TAKE 1 TABLET BY MOUTH EVERY 6 HOURS AS NEEDED 05/10/24   Cleatus Arlyss RAMAN, MD  omeprazole  (PRILOSEC) 20 MG capsule Take 1 capsule (20 mg total) by mouth daily. 05/21/24   Letvak, Richard I, MD  Polyethyl Glycol-Propyl Glycol (SYSTANE OP) Place 1 drop into both eyes daily as needed (dry eyes).     [provider]  rosuvastatin  (CRESTOR ) 10 MG tablet Take 0.5 tablets (5 mg total) by mouth daily. 09/20/23 07/24/24  Cleatus Arlyss RAMAN, MD  terconazole  (TERAZOL 7 ) 0.4 % vaginal cream Place 1 applicator vaginally at bedtime as needed. 05/21/23   Cleatus Arlyss RAMAN, MD    Current Medications Scheduled Meds: Continuous Infusions:  sodium chloride  3% (hypertonic) 100 mL (07/27/24 2239)   PRN Meds:.  CBC Recent Labs  Lab 07/27/24 1818  WBC 7.1  NEUTROABS 5.2  HGB 15.1*  HCT 40.7  MCV 87.0  PLT 241   Basic Metabolic Panel Recent Labs  Lab 07/27/24 1818  NA 115*  K 3.6  CL 81*  CO2 20*  GLUCOSE 116*  BUN 16  CREATININE 0.98  CALCIUM  8.8*    Physical Exam  Blood pressure (!) 165/103, pulse 63, temperature 97.9 F (36.6 C),  temperature source Oral, resp. rate 16, height 5' 3 (1.6 m), weight 63 kg, SpO2 100%. GEN: Elderly female, NAD, conversant, sometimes takes a while to answer the question and gets confused before correcting herself ENT: NCAT, MMM EYES: EOMI CV: Regular, normal S1 and S2 PULM: Clear, normal work of breathing, no crackles ABD: Soft, nontender SKIN: No rashes or lesions EXT: No peripheral edema NEURO: AAO to self, location, date, year, day of the week; no asterixis  Assessment 86F presenting with stumbling and falls over the weekend and found to have acute on chronic hyponatremia with a sodium of 115.  AoC Severe Hyonatremia, Euvolemic, presumed hypotonic.  Likely SIADH from Cymbalta .  Recent TSH within the past 12 months normal.  Doubt polydipsia.  Doubt low solute affect. Stumbling and falls, seems more pronounced over the past several days than her baseline and likely due to symptomatic hyponatremia. Chronic hypertension, elevated in the ED, trend on home medications Fatigue with concern for depression and recent initiation of Cymbalta /duloxetine .  Plan Stop SSRI 100 mL of 3% hypertonic saline bolus, then 1 L fluid restriction per 24 hours Trend serum sodiums with target serum sodium of around 120 by morning time, not to exceed 123 Check TSH, a.m. cortisol Regular diet Serum osmolality, urine sodium, urine osmolality pending Follow closely  [name]  07/27/2024, 10:48 PM

## 2024-07-27 NOTE — ED Provider Notes (Addendum)
 [facility]ERGENCY DEPARTMENT AT [facility] Provider Note   CSN: [id number] Arrival date & time: 07/27/24  1734     Patient presents with: Memory Loss   [name] is a 78 y.o. female.   78 year old female presents today for concern of multiple falls that have been occurring over the past several days.  She has chronic fatigue as well as depression.  She was recently started on Cymbalta  but only took 1 dose and has some reflux so she stopped it because of this.  Has not had any additional doses.  She was also recently started on omeprazole .  She drinks lots of water per day.  Denies any caffeine use.  No GI illness recently.  She denies any chest pain, shortness of breath.  She is alert and oriented x 4.  The history is provided by the patient. No language interpreter was used.       Prior to Admission medications   Medication Sig Start Date End Date Taking? Authorizing Provider  acetaminophen  (TYLENOL ) 500 MG tablet Take 1,000 mg by mouth every 6 (six) hours as needed (pain).    [provider]  diltiazem  (CARDIZEM  CD) 120 MG 24 hr capsule Take 1 capsule (120 mg total) by mouth daily. 10/22/23   Cleatus Arlyss RAMAN, MD  diltiazem  (CARDIZEM ) 30 MG tablet Take 1 tablet (30 mg total) by mouth daily as needed. 05/04/24   Verlin Lonni BIRCH, MD  DULoxetine  (CYMBALTA ) 30 MG capsule Take 1 capsule (30 mg total) by mouth daily. 02/02/24   Cleatus Arlyss RAMAN, MD  famotidine  (PEPCID ) 20 MG tablet Take 1 tablet by mouth twice daily 06/16/24   Cleatus Arlyss RAMAN, MD  LORazepam  (ATIVAN ) 1 MG tablet TAKE 1 TABLET BY MOUTH EVERY 6 HOURS AS NEEDED 05/10/24   Cleatus Arlyss RAMAN, MD  omeprazole  (PRILOSEC) 20 MG capsule Take 1 capsule (20 mg total) by mouth daily. 05/21/24   Letvak, Richard I, MD  Polyethyl Glycol-Propyl Glycol (SYSTANE OP) Place 1 drop into both eyes daily as needed (dry eyes).     [provider]  rosuvastatin  (CRESTOR ) 10 MG tablet Take 0.5 tablets (5 mg total)  by mouth daily. 09/20/23 07/24/24  Cleatus Arlyss RAMAN, MD  terconazole  (TERAZOL 7 ) 0.4 % vaginal cream Place 1 applicator vaginally at bedtime as needed. 05/21/23   Cleatus Arlyss RAMAN, MD    Allergies: Hydrocodone; Antihistamines, loratadine-type; Cephalexin; Citalopram; Codeine; Demerol [meperidine]; Epinephrine ; Erythromycin; Hydrochlorothiazide ; Hyoscyamine  sulfate; Inderal [propranolol]; Lidocaine ; Lipitor [atorvastatin]; Losartan potassium; Morphine; Nebivolol; Paxil [paroxetine hcl]; Penicillins; Prednisone; Propranolol hcl; Sertraline hcl; Tetracycline; Toprol  xl [metoprolol  succinate]; Verapamil; Hydralazine ; and Oxycodone -acetaminophen     Review of Systems  Constitutional:  Negative for fever.  Respiratory:  Negative for shortness of breath.   Cardiovascular:  Negative for chest pain.  Gastrointestinal:  Negative for abdominal pain.  Neurological:  Positive for dizziness (Balance issues resulting in falls). Negative for weakness, light-headedness and headaches.  All other systems reviewed and are negative.   Updated Vital Signs BP (!) 165/103 (BP Location: Left Arm)   Pulse 63   Temp 97.9 F (36.6 C) (Oral)   Resp 16   Ht 5' 3 (1.6 m)   Wt 63 kg   SpO2 100%   BMI 24.60 kg/m   Physical Exam Vitals and nursing note reviewed.  Constitutional:      General: She is not in acute distress.    Appearance: Normal appearance. She is not ill-appearing.  HENT:     Head: Normocephalic  and atraumatic.     Nose: Nose normal.  Eyes:     Extraocular Movements: Extraocular movements intact.     Conjunctiva/sclera: Conjunctivae normal.     Pupils: Pupils are equal, round, and reactive to light.  Pulmonary:     Effort: Pulmonary effort is normal. No respiratory distress.  Musculoskeletal:        General: No deformity.  Skin:    Findings: No rash.  Neurological:     General: No focal deficit present.     Mental Status: She is alert and oriented to person, place, and time. Mental status  is at baseline.     Cranial Nerves: No cranial nerve deficit.     Sensory: No sensory deficit.     Comments: Cranial nerves III through XII intact.  Full range of motion of bilateral upper and lower extremities with 5/5 strength in extensor and flexor muscle groups.  No pronator drift.  Normal speech.     (all labs ordered are listed, but only abnormal results are displayed) Labs Reviewed  COMPREHENSIVE METABOLIC PANEL WITH GFR - Abnormal; Notable for the following components:      Result Value   Sodium 115 (*)    Chloride 81 (*)    CO2 20 (*)    Glucose, Bld 116 (*)    Calcium  8.8 (*)    GFR, Estimated 60 (*)    All other components within normal limits  URINALYSIS, W/ REFLEX TO CULTURE (INFECTION SUSPECTED) - Abnormal; Notable for the following components:   Glucose, UA 50 (*)    Hgb urine dipstick SMALL (*)    All other components within normal limits  RAPID URINE DRUG SCREEN, HOSP PERFORMED - Abnormal; Notable for the following components:   Benzodiazepines POSITIVE (*)    All other components within normal limits  CBC WITH DIFFERENTIAL/PLATELET - Abnormal; Notable for the following components:   Hemoglobin 15.1 (*)    MCHC 37.1 (*)    All other components within normal limits  ETHANOL  OSMOLALITY  OSMOLALITY, URINE  SODIUM, URINE, RANDOM  SODIUM  SODIUM  SODIUM  SODIUM  TSH  CORTISOL-AM, BLOOD    EKG: None  Radiology: CT HEAD WO CONTRAST ( ) Result Date: 07/27/2024 CLINICAL DATA:  Altered mental status. EXAM: CT HEAD WITHOUT CONTRAST TECHNIQUE: Contiguous axial images were obtained from the base of the skull through the vertex without intravenous contrast. RADIATION DOSE REDUCTION: This exam was performed according to the departmental dose-optimization program which includes automated exposure control, adjustment of the mA and/or kV according to patient size and/or use of iterative reconstruction technique. COMPARISON:  February 20, 2018 FINDINGS: Brain: There is  generalized cerebral atrophy with widening of the extra-axial spaces and ventricular dilatation. There are areas of decreased attenuation within the white matter tracts of the supratentorial brain, consistent with microvascular disease changes. Vascular: Mild bilateral cavernous carotid artery calcification is noted. Skull: Normal. Negative for fracture or focal lesion. Sinuses/Orbits: No acute finding. Other: None. IMPRESSION: 1. Generalized cerebral atrophy and microvascular disease changes of the supratentorial brain. 2. No acute intracranial abnormality. Electronically Signed   By: Suzen Dials M.D.   On: 07/27/2024 19:35     .Critical Care  Performed by: Hildegard Loge, PA-C Authorized by: Hildegard Loge, PA-C   Critical care provider statement:    Critical care time (minutes):  30   Critical care was necessary to treat or prevent imminent or life-threatening deterioration of the following conditions: Critical hyponatremia requiring admission and 3% hypertonic solution.  Critical care was time spent personally by me on the following activities:  Development of treatment plan with patient or surrogate, discussions with consultants, evaluation of patient's response to treatment, examination of patient, ordering and review of laboratory studies, ordering and review of radiographic studies, ordering and performing treatments and interventions, pulse oximetry, re-evaluation of patient's condition and review of old charts   Care discussed with: admitting provider      Medications Ordered in the ED  sodium chloride  3% (hypertonic) IV bolus 100 mL (100 mLs Intravenous New Bag/Given 07/27/24 2239)  sodium chloride  0.9 % bolus 500 mL (500 mLs Intravenous New Bag/Given 07/27/24 2128)    Clinical Course as of 07/27/24 2300  Mon Jul 27, 2024  2145 Spoke with Dr. Marlee of nephrology.  He recommends 100 mL bolus of 3% saline.  He will come evaluate patient and provide additional recommendations. [AA]     Clinical Course User Index [AA] Hildegard Loge, PA-C                                 Medical Decision Making Risk Prescription drug management. Decision regarding hospitalization.   Medical Decision Making / ED Course   This patient presents to the ED for concern of falls, disequilibrium, this involves an extensive number of treatment options, and is a complaint that carries with it a high risk of complications and morbidity.  The differential diagnosis includes metabolic encephalopathy, metabolic derangement acute intracranial injury, fracture, contusion  MDM: 78 year old female presents with her husband for concern of balance issues and falls that are worsening.  They reached out to their PCP and were recommended to come into the emergency department.  CBC without leukocytosis.  Hemoglobin of 15.  CMP shows critical sodium of 115.  No acute concern otherwise.  UA without evidence of UTI.  Half a liter fluid bolus given.  Discussed with hospitalist.  He recommends nephrology consult to see if patient requires 3% hypertonic solution and ICU admission.  Nephrology recommends one-time bolus of 100 mL 3% hypertonic solution.  They will come and evaluate patient and provide additional recommendations.  Nephrology note available for review.  No additional 3% hypertonic solution at this time. Discussed with hospitalist Dr. Franky.  They will evaluate patient for admission.  Patient is at baseline mentation currently.   Additional history obtained: -Additional history obtained from husband at bedside -External records from outside source obtained and reviewed including: Chart review including previous notes, labs, imaging, consultation notes   Lab Tests: -I ordered, reviewed, and interpreted labs.   The pertinent results include:   Labs Reviewed  COMPREHENSIVE METABOLIC PANEL WITH GFR - Abnormal; Notable for the following components:      Result Value   Sodium 115 (*)     Chloride 81 (*)    CO2 20 (*)    Glucose, Bld 116 (*)    Calcium  8.8 (*)    GFR, Estimated 60 (*)    All other components within normal limits  URINALYSIS, W/ REFLEX TO CULTURE (INFECTION SUSPECTED) - Abnormal; Notable for the following components:   Glucose, UA 50 (*)    Hgb urine dipstick SMALL (*)    All other components within normal limits  RAPID URINE DRUG SCREEN, HOSP PERFORMED - Abnormal; Notable for the following components:   Benzodiazepines POSITIVE (*)    All other components within normal limits  CBC WITH DIFFERENTIAL/PLATELET - Abnormal; Notable for the following  components:   Hemoglobin 15.1 (*)    MCHC 37.1 (*)    All other components within normal limits  ETHANOL  SODIUM, URINE, RANDOM  OSMOLALITY  OSMOLALITY, URINE  SODIUM  SODIUM  SODIUM  SODIUM  TSH  CORTISOL-AM, BLOOD      EKG  EKG Interpretation Date/Time:    Ventricular Rate:    PR Interval:    QRS Duration:    QT Interval:    QTC Calculation:   R Axis:      Text Interpretation:           Imaging Studies ordered: I ordered imaging studies including CT head which shows no acute finding I independently visualized and interpreted imaging. I agree with the radiologist interpretation   Medicines ordered and prescription drug management: Meds ordered this encounter  Medications   sodium chloride  0.9 % bolus 500 mL   DISCONTD: sodium chloride  (hypertonic) 3 % solution   sodium chloride  3% (hypertonic) IV bolus 100 mL    -I have reviewed the patients home medicines and have made adjustments as needed  Critical interventions 3% hypertonic solution  Consultations Obtained: I requested consultation with the nephrology,  and discussed lab and imaging findings as well as pertinent plan - they recommend: See above as well as their separate note   Cardiac Monitoring: The patient was maintained on a cardiac monitor.  I personally viewed and interpreted the cardiac monitored which showed an  underlying rhythm of: Normal sinus rhythm  Social Determinants of Health:  Factors impacting patients care include: Good family support   Reevaluation: After the interventions noted above, I reevaluated the patient and found that they have :stayed the same  Co morbidities that complicate the patient evaluation  Past Medical History:  Diagnosis Date   Adenomatous colon polyp 12/2006   Allergic rhinitis    Anemia    Anxiety    Appendicitis 04/2020   Arthritis    Bradycardia    s/p MDT PPM   Cataract    bilateral   Clotting disorder (HCC) September 26, 2017   Depression    Esophageal stricture    Family history of adverse reaction to anesthesia    maternal uncle with MH, nephew had MH during surgery and was OK, pt not been tested   GERD (gastroesophageal reflux disease)    Heart murmur    Hemorrhoids    Hyperlipidemia    Hypertension    Inguinal hernia    Osteoporosis    Presence of permanent cardiac pacemaker 2006   SSS   PVC (premature ventricular contraction)    Scleritis of right eye    Skin cancer    Basal and squamous cell   Sleep apnea    a. intolerant of CPAP, does wear a mouthpiece   Subdural hematoma (HCC)    a. spontanous 09/2017 b. resolved without intervention   Thyroid  disease       Dispostion: Discussed with hospitalist.  They will evaluate patient for admission.  Final diagnoses:  Hyponatremia  Fall, initial encounter    ED Discharge Orders     None          Hildegard Loge, PA-C 07/27/24 2308    Hildegard Loge, PA-C 07/27/24 2310    Hildegard Loge, PA-C 07/27/24 2311    Tegeler, Lonni PARAS, MD 07/27/24 2329

## 2024-07-27 NOTE — Telephone Encounter (Signed)
 Reason for Disposition . Patient sounds very sick or weak to the triager . [1] MODERATE weakness (e.g., interferes with work, school, normal activities) AND [2] cause unknown  (Exceptions: Weakness from acute minor illness or poor fluid intake; weakness is chronic and not worse.)  Additional Information . Negative: [1] SEVERE weakness (e.g., unable to walk or barely able to walk, requires support) AND [2] new-onset or getting worse    Multiple falls  Answer Assessment - Initial Assessment Questions Additional info: Patient has been dealing with increase fatigue for one year, now is feeling off balance since Saturday. She had a fall onto couch on Saturday, yesterday she lost her balance while getting up from toilet and landed on bathtub hitting her abdomen. This Clinical research associate recommended ER evaluation but family requests to check in with Dr. Cleatus first before proceeding to ER. Called to [facility], per patient request, explained situation to clinic RN, husband Riva transferred to [facility] for further assistance.    1. MECHANISM: How did the fall happen?     Off balance yesterday when getting off toilet, landed on bathtub. Also had a fall onto the couch on Saturday. Continues to feel off balance today. Denies lightheadedness, denies vertigo.  2. DOMESTIC VIOLENCE AND ELDER ABUSE SCREENING: Did you fall because someone pushed you or tried to hurt you? If Yes, ask: Are you safe now?      3. ONSET: When did the fall happen? (e.g., minutes, hours, or days ago)     8/23 and 8/24 4. LOCATION: What part of the body hit the ground? (e.g., back, buttocks, head, hips, knees, hands, head, stomach)     Abdomen hit bathtub, did not hit head  5. INJURY: Did you hurt (injure) yourself when you fell? If Yes, ask: What did you injure? Tell me more about this? (e.g., body area; type of injury; pain severity)     Hit abdomen 6. PAIN: Is there any pain? If Yes, ask: How bad is the pain? (e.g., Scale 0-10; or  none, mild,      Yes 7. SIZE: For cuts, bruises, or swelling, ask: How large is it? (e.g., inches or centimeters)      Denies 8. PREGNANCY: Is there any chance you are pregnant? When was your last menstrual period?      9. OTHER SYMPTOMS: Do you have any other symptoms? (e.g., dizziness, fever, weakness; new-onset or worsening).      Denies 10. CAUSE: What do you think caused the fall (or falling)? (e.g., dizzy spell, tripped)       Unsure.  Answer Assessment - Initial Assessment Questions 1. DESCRIPTION: Describe how you are feeling.     Off balance  2. SEVERITY: How bad is it?  Can you stand and walk?     Falling  3. ONSET: When did these symptoms begin? (e.g., hours, days, weeks, months)     One year of fatigue, new off balance since Saturday 4. CAUSE: What do you think is causing the weakness or fatigue? (e.g., not drinking enough fluids, medical problem, trouble sleeping)     Unsure-depression, medication, sleep apnea 5. NEW MEDICINES:  Have you started on any new medicines recently? (e.g., opioid pain medicines, benzodiazepines, muscle relaxants, antidepressants, antihistamines, neuroleptics, beta blockers)     No 6. OTHER SYMPTOMS: Do you have any other symptoms? (e.g., chest pain, fever, cough, SOB, vomiting, diarrhea, bleeding, other areas of pain)     Fatigue, off balance, falls.  Protocols used: Weakness (Generalized) and Fatigue-A-AH, Falls and  Falling-A-AH

## 2024-07-28 ENCOUNTER — Encounter (HOSPITAL_COMMUNITY): Payer: Self-pay | Admitting: Internal Medicine

## 2024-07-28 DIAGNOSIS — Z833 Family history of diabetes mellitus: Secondary | ICD-10-CM | POA: Diagnosis not present

## 2024-07-28 DIAGNOSIS — Z818 Family history of other mental and behavioral disorders: Secondary | ICD-10-CM | POA: Diagnosis not present

## 2024-07-28 DIAGNOSIS — E871 Hypo-osmolality and hyponatremia: Secondary | ICD-10-CM | POA: Diagnosis not present

## 2024-07-28 DIAGNOSIS — Z950 Presence of cardiac pacemaker: Secondary | ICD-10-CM | POA: Diagnosis not present

## 2024-07-28 DIAGNOSIS — W19XXXA Unspecified fall, initial encounter: Secondary | ICD-10-CM | POA: Diagnosis not present

## 2024-07-28 DIAGNOSIS — Z85828 Personal history of other malignant neoplasm of skin: Secondary | ICD-10-CM | POA: Diagnosis not present

## 2024-07-28 DIAGNOSIS — G4733 Obstructive sleep apnea (adult) (pediatric): Secondary | ICD-10-CM | POA: Diagnosis not present

## 2024-07-28 DIAGNOSIS — K2100 Gastro-esophageal reflux disease with esophagitis, without bleeding: Secondary | ICD-10-CM | POA: Diagnosis not present

## 2024-07-28 DIAGNOSIS — Z8041 Family history of malignant neoplasm of ovary: Secondary | ICD-10-CM | POA: Diagnosis not present

## 2024-07-28 DIAGNOSIS — E7800 Pure hypercholesterolemia, unspecified: Secondary | ICD-10-CM

## 2024-07-28 DIAGNOSIS — T43215A Adverse effect of selective serotonin and norepinephrine reuptake inhibitors, initial encounter: Secondary | ICD-10-CM | POA: Diagnosis present

## 2024-07-28 DIAGNOSIS — E876 Hypokalemia: Secondary | ICD-10-CM | POA: Diagnosis present

## 2024-07-28 DIAGNOSIS — Z8616 Personal history of COVID-19: Secondary | ICD-10-CM | POA: Diagnosis not present

## 2024-07-28 DIAGNOSIS — Z79899 Other long term (current) drug therapy: Secondary | ICD-10-CM | POA: Diagnosis not present

## 2024-07-28 DIAGNOSIS — Z83719 Family history of colon polyps, unspecified: Secondary | ICD-10-CM | POA: Diagnosis not present

## 2024-07-28 DIAGNOSIS — Z8261 Family history of arthritis: Secondary | ICD-10-CM | POA: Diagnosis not present

## 2024-07-28 DIAGNOSIS — Z83438 Family history of other disorder of lipoprotein metabolism and other lipidemia: Secondary | ICD-10-CM | POA: Diagnosis not present

## 2024-07-28 DIAGNOSIS — I10 Essential (primary) hypertension: Secondary | ICD-10-CM

## 2024-07-28 DIAGNOSIS — K9049 Malabsorption due to intolerance, not elsewhere classified: Secondary | ICD-10-CM | POA: Diagnosis present

## 2024-07-28 DIAGNOSIS — F32A Depression, unspecified: Secondary | ICD-10-CM | POA: Diagnosis present

## 2024-07-28 DIAGNOSIS — I495 Sick sinus syndrome: Secondary | ICD-10-CM | POA: Diagnosis present

## 2024-07-28 DIAGNOSIS — E890 Postprocedural hypothyroidism: Secondary | ICD-10-CM | POA: Diagnosis present

## 2024-07-28 DIAGNOSIS — Z803 Family history of malignant neoplasm of breast: Secondary | ICD-10-CM | POA: Diagnosis not present

## 2024-07-28 DIAGNOSIS — Z96651 Presence of right artificial knee joint: Secondary | ICD-10-CM | POA: Diagnosis present

## 2024-07-28 DIAGNOSIS — Z8249 Family history of ischemic heart disease and other diseases of the circulatory system: Secondary | ICD-10-CM | POA: Diagnosis not present

## 2024-07-28 LAB — CBC
HCT: 38.2 % (ref 36.0–46.0)
Hemoglobin: 14.3 g/dL (ref 12.0–15.0)
MCH: 32.6 pg (ref 26.0–34.0)
MCHC: 37.4 g/dL — ABNORMAL HIGH (ref 30.0–36.0)
MCV: 87 fL (ref 80.0–100.0)
Platelets: 184 K/uL (ref 150–400)
RBC: 4.39 MIL/uL (ref 3.87–5.11)
RDW: 12 % (ref 11.5–15.5)
WBC: 4.9 K/uL (ref 4.0–10.5)
nRBC: 0 % (ref 0.0–0.2)

## 2024-07-28 LAB — SODIUM
Sodium: 125 mmol/L — ABNORMAL LOW (ref 135–145)
Sodium: 125 mmol/L — ABNORMAL LOW (ref 135–145)
Sodium: 123 mmol/L — ABNORMAL LOW (ref 135–145)
Sodium: 125 mmol/L — ABNORMAL LOW (ref 135–145)
Sodium: 128 mmol/L — ABNORMAL LOW (ref 135–145)

## 2024-07-28 LAB — CORTISOL-AM, BLOOD: Cortisol - AM: 14.9 ug/dL (ref 6.7–22.6)

## 2024-07-28 LAB — MRSA NEXT GEN BY PCR, NASAL: MRSA by PCR Next Gen: NOT DETECTED

## 2024-07-28 LAB — CREATININE, SERUM
Creatinine, Ser: 0.84 mg/dL (ref 0.44–1.00)
GFR, Estimated: >60 mL/min (ref >60)

## 2024-07-28 MED ORDER — ENOXAPARIN SODIUM 40 MG/0.4ML IJ SOSY
40.0000 mg | PREFILLED_SYRINGE | INTRAMUSCULAR | Status: DC
  Administered 2024-07-28 – 2024-07-31 (×4): 40 mg via SUBCUTANEOUS
  Filled 2024-07-28 (×4): qty 0.4

## 2024-07-28 MED ORDER — PANTOPRAZOLE SODIUM 40 MG PO TBEC
40.0000 mg | DELAYED_RELEASE_TABLET | Freq: Every day | ORAL | Status: DC
  Administered 2024-07-28 – 2024-07-29 (×2): 40 mg via ORAL
  Filled 2024-07-28 (×2): qty 1

## 2024-07-28 MED ORDER — ACETAMINOPHEN 650 MG RE SUPP: 650.0000 mg | Freq: Four times a day (QID) | RECTAL | Status: DC | PRN

## 2024-07-28 MED ORDER — DEXTROSE 5 % IV SOLN: INTRAVENOUS | Status: DC

## 2024-07-28 MED ORDER — LORAZEPAM 1 MG PO TABS
0.5000 mg | ORAL_TABLET | Freq: Once | ORAL | Status: AC
  Administered 2024-07-28: 0.5 mg via ORAL
  Filled 2024-07-28: qty 1

## 2024-07-28 MED ORDER — LORAZEPAM 0.5 MG PO TABS
0.5000 mg | ORAL_TABLET | Freq: Four times a day (QID) | ORAL | Status: DC | PRN
  Administered 2024-07-28 – 2024-07-31 (×11): 0.5 mg via ORAL
  Filled 2024-07-28 (×11): qty 1

## 2024-07-28 MED ORDER — DILTIAZEM HCL ER COATED BEADS 120 MG PO CP24
120.0000 mg | ORAL_CAPSULE | Freq: Every day | ORAL | Status: DC
  Administered 2024-07-28 – 2024-07-31 (×4): 120 mg via ORAL
  Filled 2024-07-28 (×5): qty 1

## 2024-07-28 MED ORDER — ACETAMINOPHEN 325 MG PO TABS
650.0000 mg | ORAL_TABLET | Freq: Four times a day (QID) | ORAL | Status: DC | PRN
Start: 2024-07-28 — End: 2024-07-31

## 2024-07-28 MED ORDER — ORAL CARE MOUTH RINSE
15.0000 mL | OROMUCOSAL | Status: DC | PRN
Start: 2024-07-28 — End: 2024-07-31

## 2024-07-28 MED ORDER — ROSUVASTATIN CALCIUM 5 MG PO TABS
5.0000 mg | ORAL_TABLET | Freq: Every day | ORAL | Status: DC
  Administered 2024-07-28 – 2024-07-31 (×4): 5 mg via ORAL
  Filled 2024-07-28 (×4): qty 1

## 2024-07-28 NOTE — Telephone Encounter (Signed)
Noted. Will await ER report.  Thanks.  °

## 2024-07-28 NOTE — ED Notes (Signed)
 Help patient to the bedside toilet place a adult brief patient is now back in bed on the monitor with family at bedside

## 2024-07-28 NOTE — ED Notes (Signed)
 Placed a external cath on patient and placed another brief patient is resting with family at bedside and call bell in reach

## 2024-07-28 NOTE — Progress Notes (Signed)
 Nephrology Follow-Up Consult note  Assessment/Recommendations: [name] is a/an 78 y.o. female with a past medical history significant for bradycardia s/p PPM, chronic mild hyponatremia, GERD recently started on PPI, chronic fatigue with recent initiation of Cymbalta , hypertension on diltiazem  , admitted for falls.     Nephrology consulted for hyponatremia.  Hyponatremia, severe, chronic, hypotonic, asymptomatic: POsm: 249 UOsm: 306 UNa: <10  Cortisol 14.9 TSH 2.38  -Initially felt to be SIADH from Cymbalta .  Received 3% hypertonic saline 100 mL + fluid restriction. -Repeat sodium 125.  Started D5W. - Last sodium 123. - Would continue D5W until sodium ~120, then stop.  Continue q4h Na blood draws.  - Once normalized, will reapproach slowly increasing sodium level.  Per husband, she takes Cymbalta  once.  Unsure if significantly contributing.  However would continue to hold at this point. No recent diuretic/HCTZ use. Patient states she has adhered to a strict low-sodium diet.  Recently with likely low solute intake diet as well. This goes against the high UOsm.  Volume Status: Appears euvolemic on exam. Based on our examination and review of available imaging, our recommendation is as above.  Recommendations conveyed to primary service.    Evalene HERO Katee Wentland [facility] 07/28/2024 8:27 AM  ___________________________________________________________  CC: Falls  Interval History/Subjective: Seen in ED.  Husband at bedside.  Went over recent history.  Does not appear symptomatic.  Medications:  Current Facility-Administered Medications  Medication Dose Route Frequency Provider Last Rate Last Admin   acetaminophen  (TYLENOL ) tablet 650 mg  650 mg Oral Q6H PRN Kakrakandy, Arshad N, MD       Or   acetaminophen  (TYLENOL ) suppository 650 mg  650 mg Rectal Q6H PRN Franky Redia SAILOR, MD       dextrose  5 % solution   Intravenous Continuous Franky Redia SAILOR, MD  50 mL/hr at 07/28/24 0400 New Bag at 07/28/24 0400   diltiazem  (CARDIZEM  CD) 24 hr capsule 120 mg  120 mg Oral Daily Franky Redia SAILOR, MD       enoxaparin  (LOVENOX ) injection 40 mg  40 mg Subcutaneous Q24H Franky Redia SAILOR, MD       LORazepam  (ATIVAN ) tablet 0.5 mg  0.5 mg Oral Q6H PRN Franky Redia SAILOR, MD       pantoprazole  (PROTONIX ) EC tablet 40 mg  40 mg Oral Daily Franky Redia SAILOR, MD       rosuvastatin  (CRESTOR ) tablet 5 mg  5 mg Oral Daily Franky Redia SAILOR, MD       Current Outpatient Medications  Medication Sig Dispense Refill   acetaminophen  (TYLENOL ) 500 MG tablet Take 1,000 mg by mouth every 6 (six) hours as needed (pain).     diltiazem  (CARDIZEM  CD) 120 MG 24 hr capsule Take 1 capsule (120 mg total) by mouth daily.     diltiazem  (CARDIZEM ) 30 MG tablet Take 1 tablet (30 mg total) by mouth daily as needed. 30 tablet 3   DULoxetine  (CYMBALTA ) 30 MG capsule Take 1 capsule (30 mg total) by mouth daily. 30 capsule 2   famotidine  (PEPCID ) 20 MG tablet Take 1 tablet by mouth twice daily 180 tablet 1   LORazepam  (ATIVAN ) 1 MG tablet TAKE 1 TABLET BY MOUTH EVERY 6 HOURS AS NEEDED 120 tablet 2   omeprazole  (PRILOSEC) 20 MG capsule Take 1 capsule (20 mg total) by mouth daily. (Patient taking differently: Take 20 mg by mouth at bedtime.) 90 capsule 3   Polyethyl Glycol-Propyl Glycol (SYSTANE OP) Place 1 drop into both  eyes daily as needed (dry eyes).      rosuvastatin  (CRESTOR ) 10 MG tablet Take 0.5 tablets (5 mg total) by mouth daily.        Review of Systems: 10 systems reviewed and negative except per interval history/subjective  Physical Exam: Vitals:   07/28/24 0625 07/28/24 0700  BP: (!) 150/90 124/86  Pulse: (!) 55 (!) 55  Resp: 17 17  Temp:    SpO2: 96% 97%   No intake/output data recorded.  Intake/Output Summary (Last 24 hours) at 07/28/2024 0827 Last data filed at 07/28/2024 0610 Gross per 24 hour  Intake 600.5 ml  Output 1000 ml  Net -399.5 ml    Constitutional: well-appearing, no acute distress CV: RRR Respiratory: BLBS Gastrointestinal: soft, non-tender Skin: no visible lesions or rashes Psych: alert, judgement/insight appropriate, appropriate mood and affect   Test Results I personally reviewed new and old clinical labs and radiology tests Lab Results  Component Value Date   NA 125 (L) 07/28/2024   K 3.6 07/27/2024   CL 81 (L) 07/27/2024   CO2 20 (L) 07/27/2024   BUN 16 07/27/2024   CREATININE 0.84 07/28/2024   GFR 61.42 01/31/2024   CALCIUM  8.8 (L) 07/27/2024   ALBUMIN 3.9 07/27/2024    CBC Recent Labs  Lab 07/27/24 1818 07/28/24 0600  WBC 7.1 4.9  NEUTROABS 5.2  --   HGB 15.1* 14.3  HCT 40.7 38.2  MCV 87.0 87.0  PLT 241 184

## 2024-07-28 NOTE — ED Notes (Signed)
 Notified Dr. Jarold of repeat sodium level of 125. Ordered to keep dextrose  5% at current rate of 100ml/hr with no other orders at this time.

## 2024-07-28 NOTE — Progress Notes (Signed)
 Patient admitted earlier this morning for stumbling and falling and was found to have sodium of 115.  Nephrology has been consulted.  Patient seen and examined at bedside and plan of care discussed with her and husband at bedside.  I have reviewed patient's medical records including this morning's H&P, current vitals, labs, medications myself.  Most recent sodium this morning is 125.  Continue fluid restriction.  Follow nephrology recommendations.  Continue every 4 hours sodium monitoring.  Currently on D5 at 50 cc an hour.

## 2024-07-28 NOTE — ED Notes (Addendum)
 Dr. Franky notified of sodium 125. Informed by him that he talked to nephrology and ordered to start 5% dextrose . New orders placed and completed.

## 2024-07-28 NOTE — ED Notes (Signed)
Changed patient brief put another one on patient is resting with call bell in reach  ?

## 2024-07-28 NOTE — H&P (Signed)
 History and Physical    [name] DOB: [birth year] DOA: 07/27/2024  Patient coming from: Home.  Chief Complaint: Stumbling and falling.  HPI: [name] is a 78 y.o. female with history of hypertension, sick sinus syndrome status post pacemaker placement, chronic mild hyponatremia who has been experiencing fatigue for the last year and a half after patient had COVID-19 infection was placed on Cymbalta  for possible depression and had taken 2 doses on July 24, 2024.  The following day patient felt uncomfortable and also noticed that she has been having increased difficulty walking due to balance issues resulting in stumbling and falls.  Called her PCP and was instructed to come to the ER.  Patient states she has been eating less than usual and has been drinking water not more than usual.  Patient previously had hyponatremia from hydrochlorothiazide  which was discontinued many months ago.  Patient's sodium usually runs around 128-132.  ED Course: In the ER labs show sodium of 115.  Chloride 81 TSH 2.3 CT head unremarkable.  Urine osmolality was 306 urine sodium less than 10.  Nephrologist on-call was consulted and felt that patient's symptoms were consistent with severe hyponatremia was given 100 mL of 3% sodium chloride  and admitted for further management.  At the time of my exam patient is ambulating without difficulty.  Review of Systems: As per HPI, rest all negative.   Past Medical History:  Diagnosis Date   Adenomatous colon polyp 12/2006   Allergic rhinitis    Anemia    Anxiety    Appendicitis 04/2020   Arthritis    Bradycardia    s/p MDT PPM   Cataract    bilateral   Clotting disorder (HCC) September 26, 2017   Depression    Esophageal stricture    Family history of adverse reaction to anesthesia    maternal uncle with MH, nephew had MH during surgery and was OK, pt not been tested   GERD (gastroesophageal reflux disease)    Heart murmur    Hemorrhoids     Hyperlipidemia    Hypertension    Inguinal hernia    Osteoporosis    Presence of permanent cardiac pacemaker 2006   SSS   PVC (premature ventricular contraction)    Scleritis of right eye    Skin cancer    Basal and squamous cell   Sleep apnea    a. intolerant of CPAP, does wear a mouthpiece   Subdural hematoma (HCC)    a. spontanous 09/2017 b. resolved without intervention   Thyroid  disease     Past Surgical History:  Procedure Laterality Date   ABDOMINAL HYSTERECTOMY     APPENDECTOMY     BREAST BIOPSY Left 01/15/2014   BREAST BIOPSY Right 06/25/2023   US  RT BREAST BX W LOC DEV 1ST LESION IMG BX SPEC US  GUIDE 06/25/2023 [facility]   BREAST SURGERY     Biopsy-benign   CESAREAN SECTION     COLONOSCOPY  2018   DILATION AND CURETTAGE OF UTERUS     ESOPHAGOGASTRODUODENOSCOPY     esophageal stretching   EYE SURGERY     HAND SURGERY  1992   HERNIA REPAIR     INGUINAL HERNIA REPAIR  02/12/1994   left inguinal hernia repair   JOINT REPLACEMENT  12/21/2014   KNEE ARTHROSCOPY  2011   LAPAROSCOPIC APPENDECTOMY N/A 11/29/2020   Procedure: APPENDECTOMY LAPAROSCOPIC;  Surgeon: Paola Dreama SAILOR, MD;  Location: [facility];  Service: General;  Laterality: N/A;   OOPHORECTOMY  1995   LSO and RSO   PACEMAKER GENERATOR CHANGE N/A 01/21/2015   Procedure: PACEMAKER GENERATOR CHANGE;  Surgeon: Lynwood Rakers, MD;  Location: [facility];  Service: Cardiovascular;  Laterality: N/A;   PACEMAKER INSERTION  2006   Dr. Ellin   POLYPECTOMY     Tear duct surgery  2010   THUMB SURGERY     THYROIDECTOMY  1979   RIGHT SIDE   TOTAL KNEE ARTHROPLASTY Right 12/20/2014   Procedure: RIGHT TOTAL KNEE ARTHROPLASTY;  Surgeon: Marcey Raman, MD;  Location: [facility] OR;  Service: Orthopedics;  Laterality: Right;   UPPER GASTROINTESTINAL ENDOSCOPY     unsure     reports that she has never smoked. She has never used smokeless tobacco. She reports that she does not drink alcohol  and does not  use drugs.  Allergies  Allergen Reactions   Hydrocodone     Increased HR   Antihistamines, Loratadine-Type Other (See Comments)    Heart races/jittery   Cephalexin Nausea And Vomiting   Citalopram     intolerant   Codeine Other (See Comments)    Increased heart rate, anxiety   Demerol [Meperidine] Other (See Comments)    Increased heart rate, anxiety   Epinephrine  Other (See Comments)    Increased heart rate   Erythromycin Nausea And Vomiting   Hydrochlorothiazide      Low sodium   Hyoscyamine  Sulfate Other (See Comments)    Pt could not tolerate this med UNKNOWN reaction   Inderal [Propranolol] Other (See Comments)    Extreme tiredness   Lidocaine  Other (See Comments)    Family hx of malignant hyperthermia   Lipitor [Atorvastatin] Other (See Comments)    Muscle aches   Losartan Potassium Other (See Comments)    Pt could not tolerate this med UNKNOWN REACTION   Morphine Other (See Comments)    Nightmares, increased BP   Nebivolol Other (See Comments)    angioedema   Paxil [Paroxetine Hcl] Other (See Comments)    Intolerant, mood changes.    Penicillins Nausea And Vomiting   Prednisone Other (See Comments)    Tolerates low doses if absolutely necessary - makes her nervous, jittery   Propranolol Hcl Other (See Comments)    Pt could not tolerate this med UNKNOWN REACTION   Sertraline Hcl Other (See Comments)    Pt could not tolerate this med UNKNOWN REACTION   Tetracycline Nausea And Vomiting   Toprol  Xl [Metoprolol  Succinate] Other (See Comments)    Extreme tiredness   Verapamil Other (See Comments)    Pt could not tolerate this med UNKNOWN REACTION   Hydralazine      headaches   Oxycodone -Acetaminophen  Palpitations    Causes accelerated heart rate    Family History  Problem Relation Age of Onset   Heart failure Sister    Heart disease Sister    Hypertension Sister    Colon polyps Sister    Arthritis Sister    Depression Sister    Dementia Mother     Hypertension Mother    Colon polyps Mother    Anxiety disorder Mother    Hypertension Father    Arthritis Father    Hyperlipidemia Sister    Breast cancer Maternal Aunt        Age 11's   Diabetes Maternal Aunt    Diabetes Maternal Uncle    Ovarian cancer Maternal Aunt    Colon cancer Neg Hx    Esophageal cancer Neg Hx  Stomach cancer Neg Hx    Rectal cancer Neg Hx     Prior to Admission medications   Medication Sig Start Date End Date Taking? Authorizing Provider  acetaminophen  (TYLENOL ) 500 MG tablet Take 1,000 mg by mouth every 6 (six) hours as needed (pain).    [provider]  diltiazem  (CARDIZEM  CD) 120 MG 24 hr capsule Take 1 capsule (120 mg total) by mouth daily. 10/22/23   Cleatus Arlyss RAMAN, MD  diltiazem  (CARDIZEM ) 30 MG tablet Take 1 tablet (30 mg total) by mouth daily as needed. 05/04/24   Verlin Lonni BIRCH, MD  DULoxetine  (CYMBALTA ) 30 MG capsule Take 1 capsule (30 mg total) by mouth daily. 02/02/24   Cleatus Arlyss RAMAN, MD  famotidine  (PEPCID ) 20 MG tablet Take 1 tablet by mouth twice daily 06/16/24   Cleatus Arlyss RAMAN, MD  LORazepam  (ATIVAN ) 1 MG tablet TAKE 1 TABLET BY MOUTH EVERY 6 HOURS AS NEEDED 05/10/24   Cleatus Arlyss RAMAN, MD  omeprazole  (PRILOSEC) 20 MG capsule Take 1 capsule (20 mg total) by mouth daily. 05/21/24   Letvak, Richard I, MD  Polyethyl Glycol-Propyl Glycol (SYSTANE OP) Place 1 drop into both eyes daily as needed (dry eyes).     [provider]  rosuvastatin  (CRESTOR ) 10 MG tablet Take 0.5 tablets (5 mg total) by mouth daily. 09/20/23 07/24/24  Cleatus Arlyss RAMAN, MD  terconazole  (TERAZOL 7 ) 0.4 % vaginal cream Place 1 applicator vaginally at bedtime as needed. 05/21/23   Cleatus Arlyss RAMAN, MD    Physical Exam: Constitutional: Moderately built and nourished. Vitals:   07/27/24 2035 07/27/24 2113 07/28/24 0034 07/28/24 0230  BP: (!) 166/108 (!) 165/103 135/88 (!) 142/85  Pulse: 67 63 (!) 56 60  Resp:  16 16 16   Temp:  97.9 F (36.6 C)  97.8 F (36.6 C)   TempSrc:  Oral Oral   SpO2: 100% 100% 98% 99%  Weight:      Height:       Eyes: Anicteric no pallor. ENMT: No discharge from the ears eyes nose or mouth. Neck: No mass felt.  No neck rigidity. Respiratory: No rhonchi or crepitations. Cardiovascular: S1-S2 heard. Abdomen: Soft nontender bowel sound present. Musculoskeletal: No edema. Skin: No rash. Neurologic: Alert awake oriented time place and person.  Moves all extremities. Psychiatric: Appears normal.  Normal affect.   Labs on Admission: I have personally reviewed following labs and imaging studies  CBC: Recent Labs  Lab 07/27/24 1818  WBC 7.1  NEUTROABS 5.2  HGB 15.1*  HCT 40.7  MCV 87.0  PLT 241   Basic Metabolic Panel: Recent Labs  Lab 07/27/24 1818 07/27/24 2225  NA 115* 119*  K 3.6  --   CL 81*  --   CO2 20*  --   GLUCOSE 116*  --   BUN 16  --   CREATININE 0.98  --   CALCIUM  8.8*  --    GFR: Estimated Creatinine Clearance: 43.6 mL/min (by C-G formula based on SCr of 0.98 mg/dL). Liver Function Tests: Recent Labs  Lab 07/27/24 1818  AST 22  ALT 19  ALKPHOS 54  BILITOT 0.9  PROT 6.8  ALBUMIN 3.9   No results for input(s): LIPASE, AMYLASE in the last 168 hours. No results for input(s): AMMONIA in the last 168 hours. Coagulation Profile: No results for input(s): INR, PROTIME in the last 168 hours. Cardiac Enzymes: No results for input(s): CKTOTAL, CKMB, CKMBINDEX, TROPONINI in the last 168 hours. BNP (last 3 results) No  results for input(s): PROBNP in the last 8760 hours. HbA1C: No results for input(s): HGBA1C in the last 72 hours. CBG: No results for input(s): GLUCAP in the last 168 hours. Lipid Profile: No results for input(s): CHOL, HDL, LDLCALC, TRIG, CHOLHDL, LDLDIRECT in the last 72 hours. Thyroid  Function Tests: Recent Labs    07/27/24 2207  TSH 2.384   Anemia Panel: No results for input(s): VITAMINB12, FOLATE,  FERRITIN, TIBC, IRON, RETICCTPCT in the last 72 hours. Urine analysis:    Component Value Date/Time   COLORURINE YELLOW 07/27/2024 2207   APPEARANCEUR CLEAR 07/27/2024 2207   LABSPEC 1.009 07/27/2024 2207   PHURINE 5.0 07/27/2024 2207   GLUCOSEU 50 (A) 07/27/2024 2207   HGBUR SMALL (A) 07/27/2024 2207   BILIRUBINUR NEGATIVE 07/27/2024 2207   BILIRUBINUR neg 05/21/2023 0952   KETONESUR NEGATIVE 07/27/2024 2207   PROTEINUR NEGATIVE 07/27/2024 2207   UROBILINOGEN 0.2 05/21/2023 0952   UROBILINOGEN 0.2 12/07/2014 1616   NITRITE NEGATIVE 07/27/2024 2207   LEUKOCYTESUR NEGATIVE 07/27/2024 2207   Sepsis Labs: @LABRCNTIP (procalcitonin:4,lacticidven:4) )No results found for this or any previous visit (from the past 240 hours).   Radiological Exams on Admission: CT HEAD WO CONTRAST ( ) Result Date: 07/27/2024 CLINICAL DATA:  Altered mental status. EXAM: CT HEAD WITHOUT CONTRAST TECHNIQUE: Contiguous axial images were obtained from the base of the skull through the vertex without intravenous contrast. RADIATION DOSE REDUCTION: This exam was performed according to the departmental dose-optimization program which includes automated exposure control, adjustment of the mA and/or kV according to patient size and/or use of iterative reconstruction technique. COMPARISON:  February 20, 2018 FINDINGS: Brain: There is generalized cerebral atrophy with widening of the extra-axial spaces and ventricular dilatation. There are areas of decreased attenuation within the white matter tracts of the supratentorial brain, consistent with microvascular disease changes. Vascular: Mild bilateral cavernous carotid artery calcification is noted. Skull: Normal. Negative for fracture or focal lesion. Sinuses/Orbits: No acute finding. Other: None. IMPRESSION: 1. Generalized cerebral atrophy and microvascular disease changes of the supratentorial brain. 2. No acute intracranial abnormality. Electronically Signed   By:  Suzen Dials M.D.   On: 07/27/2024 19:35     Assessment/Plan Principal Problem:   Hyponatremia Active Problems:   Hypercholesterolemia   Obstructive sleep apnea   Sick sinus syndrome (HCC)   Pacemaker-Medtronic   HTN (hypertension)   Reflux esophagitis    Acute on chronic severe hyponatremia appreciate nephrology consult.  Check serial sodium levels.  Cortisol a.m. levels are pending.  Fluid restriction to 1 L/day.  Discontinue Cymbalta . Hypertension on Cardizem . Sick sinus syndrome status post pacemaker placement. GERD on PPI. Anxiety on as needed Ativan . Hyperlipidemia on statins. History of sleep apnea.  Patient is following with oral surgery for oral device.  Since patient has symptomatic severe hyponatremia will need close monitoring and more than 2 midnight stay.   DVT prophylaxis: Lovenox . Code Status: Full code. Family Communication: Discussed with patient's husband. Disposition Plan: Progressive care. Consults called: Nephrology. Admission status: Inpatient.

## 2024-07-29 DIAGNOSIS — K2100 Gastro-esophageal reflux disease with esophagitis, without bleeding: Secondary | ICD-10-CM

## 2024-07-29 DIAGNOSIS — G4733 Obstructive sleep apnea (adult) (pediatric): Secondary | ICD-10-CM

## 2024-07-29 DIAGNOSIS — I10 Essential (primary) hypertension: Secondary | ICD-10-CM | POA: Diagnosis not present

## 2024-07-29 DIAGNOSIS — E7800 Pure hypercholesterolemia, unspecified: Secondary | ICD-10-CM | POA: Diagnosis not present

## 2024-07-29 DIAGNOSIS — E871 Hypo-osmolality and hyponatremia: Secondary | ICD-10-CM | POA: Diagnosis not present

## 2024-07-29 DIAGNOSIS — Z950 Presence of cardiac pacemaker: Secondary | ICD-10-CM

## 2024-07-29 LAB — COMPREHENSIVE METABOLIC PANEL WITH GFR
ALT: 18 U/L (ref 0–44)
AST: 16 U/L (ref 15–41)
Albumin: 3.3 g/dL — ABNORMAL LOW (ref 3.5–5.0)
Alkaline Phosphatase: 50 U/L (ref 38–126)
Anion gap: 11 (ref 5–15)
BUN: 13 mg/dL (ref 8–23)
CO2: 24 mmol/L (ref 22–32)
Calcium: 8.4 mg/dL — ABNORMAL LOW (ref 8.9–10.3)
Chloride: 92 mmol/L — ABNORMAL LOW (ref 98–111)
Creatinine, Ser: 1.01 mg/dL — ABNORMAL HIGH (ref 0.44–1.00)
GFR, Estimated: 58 mL/min — ABNORMAL LOW (ref >60)
Glucose, Bld: 112 mg/dL — ABNORMAL HIGH (ref 70–99)
Potassium: 3.3 mmol/L — ABNORMAL LOW (ref 3.5–5.1)
Sodium: 127 mmol/L — ABNORMAL LOW (ref 135–145)
Total Bilirubin: 0.8 mg/dL (ref 0.0–1.2)
Total Protein: 5.6 g/dL — ABNORMAL LOW (ref 6.5–8.1)

## 2024-07-29 LAB — CBC WITH DIFFERENTIAL/PLATELET
Abs Immature Granulocytes: 0.02 K/uL (ref 0.00–0.07)
Basophils Absolute: 0.1 K/uL (ref 0.0–0.1)
Basophils Relative: 1 %
Eosinophils Absolute: 0.1 K/uL (ref 0.0–0.5)
Eosinophils Relative: 1 %
HCT: 38.4 % (ref 36.0–46.0)
Hemoglobin: 14 g/dL (ref 12.0–15.0)
Immature Granulocytes: 0 %
Lymphocytes Relative: 20 %
Lymphs Abs: 1.2 K/uL (ref 0.7–4.0)
MCH: 32.2 pg (ref 26.0–34.0)
MCHC: 36.5 g/dL — ABNORMAL HIGH (ref 30.0–36.0)
MCV: 88.3 fL (ref 80.0–100.0)
Monocytes Absolute: 1 K/uL (ref 0.1–1.0)
Monocytes Relative: 15 %
Neutro Abs: 3.9 K/uL (ref 1.7–7.7)
Neutrophils Relative %: 63 %
Platelets: 197 K/uL (ref 150–400)
RBC: 4.35 MIL/uL (ref 3.87–5.11)
RDW: 12.1 % (ref 11.5–15.5)
WBC: 6.2 K/uL (ref 4.0–10.5)
nRBC: 0 % (ref 0.0–0.2)

## 2024-07-29 LAB — SODIUM
Sodium: 126 mmol/L — ABNORMAL LOW (ref 135–145)
Sodium: 123 mmol/L — ABNORMAL LOW (ref 135–145)
Sodium: 124 mmol/L — ABNORMAL LOW (ref 135–145)
Sodium: 124 mmol/L — ABNORMAL LOW (ref 135–145)

## 2024-07-29 LAB — MAGNESIUM: Magnesium: 2 mg/dL (ref 1.7–2.4)

## 2024-07-29 MED ORDER — PANTOPRAZOLE SODIUM 40 MG PO TBEC
40.0000 mg | DELAYED_RELEASE_TABLET | Freq: Every evening | ORAL | Status: DC
  Administered 2024-07-29 – 2024-07-30 (×2): 40 mg via ORAL
  Filled 2024-07-29 (×2): qty 1

## 2024-07-29 MED ORDER — POTASSIUM CHLORIDE CRYS ER 20 MEQ PO TBCR
40.0000 meq | EXTENDED_RELEASE_TABLET | ORAL | Status: AC
  Administered 2024-07-29 (×2): 40 meq via ORAL
  Filled 2024-07-29 (×2): qty 2

## 2024-07-29 MED ORDER — POTASSIUM CHLORIDE 10 MEQ/100ML IV SOLN
10.0000 meq | INTRAVENOUS | Status: AC
  Administered 2024-07-29: 10 meq via INTRAVENOUS
  Filled 2024-07-29 (×2): qty 100

## 2024-07-29 MED ORDER — ONDANSETRON HCL 4 MG/2ML IJ SOLN
4.0000 mg | Freq: Four times a day (QID) | INTRAMUSCULAR | Status: DC
  Administered 2024-07-29 – 2024-07-30 (×3): 4 mg via INTRAVENOUS
  Filled 2024-07-29 (×4): qty 2

## 2024-07-29 MED ORDER — UREA 15 G PO PACK
15.0000 g | PACK | Freq: Every day | ORAL | Status: DC
  Filled 2024-07-29: qty 1

## 2024-07-29 MED ORDER — FAMOTIDINE 20 MG PO TABS
20.0000 mg | ORAL_TABLET | Freq: Every day | ORAL | Status: DC
  Administered 2024-07-30: 20 mg via ORAL
  Filled 2024-07-29 (×2): qty 1

## 2024-07-29 MED ORDER — SODIUM CHLORIDE 1 G PO TABS
1.0000 g | ORAL_TABLET | Freq: Two times a day (BID) | ORAL | Status: DC
  Administered 2024-07-29 – 2024-07-30 (×2): 1 g via ORAL
  Filled 2024-07-29 (×2): qty 1

## 2024-07-29 NOTE — Assessment & Plan Note (Addendum)
 07-29-2024 on cardizem  CD  07-30-2024 stable. On cardizem  cd 120 mg daily.  07-31-2024 stable on cardizem  CD 120 mg daily. Continue at home.

## 2024-07-29 NOTE — Progress Notes (Signed)
 PROGRESS NOTE    [name]  FMW:[id number] DOB: [birth year] DOA: 07/27/2024 PCP: Cleatus Arlyss RAMAN, MD  Subjective: Pt seen and examined. Met with pt and husband chuck at bedside.   Hospital Course: CC: memory problems and recent fall HPI: [name] is a 78 y.o. female with history of hypertension, sick sinus syndrome status post pacemaker placement, chronic mild hyponatremia who has been experiencing fatigue for the last year and a half after patient had COVID-19 infection was placed on Cymbalta  for possible depression and had taken 2 doses on July 24, 2024.  The following day patient felt uncomfortable and also noticed that she has been having increased difficulty walking due to balance issues resulting in stumbling and falls.  Called her PCP and was instructed to come to the ER.  Patient states she has been eating less than usual and has been drinking water not more than usual.  Patient previously had hyponatremia from hydrochlorothiazide  which was discontinued many months ago.  Patient's sodium usually runs around 128-132.   ED Course: In the ER labs show sodium of 115.  Chloride 81 TSH 2.3 CT head unremarkable.  Urine osmolality was 306 urine sodium less than 10.  Nephrologist on-call was consulted and felt that patient's symptoms were consistent with severe hyponatremia was given 100 mL of 3% sodium chloride  and admitted for further management.  At the time of my exam patient is ambulating without difficulty.  Significant Events: Admitted 07/27/2024 acute on chronic hyponatremia   Admission Labs: WBC 7.1, HgB 15.1, plt 241 ETOH <15 Na 115, K 3.6, CO2 of 20, BUN 16, Scr 0.98, glu 116 T prot 6.8, alb 3.9, AST 22, ALT 19, alk phos 54, T. Bili 0.9 UDS Positive for Benzo  Admission Imaging Studies: CT head Generalized cerebral atrophy and microvascular disease changes of the supratentorial brain. 2. No acute intracranial abnormality.  Significant Labs: 07-27-2024 Urine Na <10,  serum Na 119, serum osm 249, urine osm 306 07-27-2024 TSH 2.38 07-28-2024 AM cortisol 14.9  Significant Imaging Studies:   Antibiotic Therapy: Anti-infectives (From admission, onward)    None       Procedures:   Consultants: nephrology    Assessment and Plan: * Hyponatremia 07-29-2024 admitted on 07-27-2024. Received 3% saline by nephrology. Urine Na very low at <10. Appears her hyponatremia is due to low solute intake. Husband states that pt takes a low salt diet to the extreme. She does not eat a lot at home to begin with due to multiple food intolerance and GERD. Nephrology following. I encouraged her to eat salty snacks.  Reflux esophagitis 07-29-2024 on PPI. Pt wants to restart her pepcid  20 mg bid.  Essential hypertension 07-29-2024 on cardizem  CD  Pacemaker-Medtronic 07-29-2024 stable.  Obstructive sleep apnea 07-29-2024 continue with CPAP prn  Hypercholesterolemia 07-29-2024 on crestor  5 mg daily.  DVT prophylaxis: enoxaparin  (LOVENOX ) injection 40 mg Start: 07/28/24 1000    Code Status: Full Code Family Communication: discussed with pt and husband chuck at bedside Disposition Plan: return home Reason for continuing need for hospitalization: remains on Na monitoring.  Objective: Vitals:   07/29/24 0603 07/29/24 0734 07/29/24 1112 07/29/24 1504  BP:  (!) 160/89 120/78 125/73  Pulse: 68 76 92 78  Resp: 17 19 (!) 22 19  Temp:  97.9 F (36.6 C) 98.5 F (36.9 C) 98.4 F (36.9 C)  TempSrc:  Oral Oral Oral  SpO2: 94% 96% 98% 95%  Weight: 63.1 kg     Height:  Intake/Output Summary (Last 24 hours) at 07/29/2024 1640 Last data filed at 07/29/2024 1520 Gross per 24 hour  Intake 1364.71 ml  Output 1950 ml  Net -585.29 ml   Filed Weights   07/27/24 1748 07/28/24 1738 07/29/24 0603  Weight: 63 kg 63 kg 63.1 kg   Examination:  Physical Exam Vitals and nursing note reviewed.  Constitutional:      General: She is not in acute distress.     Appearance: She is not toxic-appearing or diaphoretic.  HENT:     Head: Normocephalic and atraumatic.     Nose: Nose normal.  Cardiovascular:     Rate and Rhythm: Normal rate and regular rhythm.  Pulmonary:     Effort: Pulmonary effort is normal.     Breath sounds: Normal breath sounds.  Abdominal:     General: Abdomen is flat. Bowel sounds are normal.     Palpations: Abdomen is soft.  Musculoskeletal:     Right lower leg: No edema.     Left lower leg: No edema.  Skin:    General: Skin is warm and dry.     Capillary Refill: Capillary refill takes less than 2 seconds.  Neurological:     General: No focal deficit present.     Mental Status: She is alert and oriented to person, place, and time.    Data Reviewed: I have personally reviewed following labs and imaging studies  CBC: Recent Labs  Lab 07/27/24 1818 07/28/24 0600 07/29/24 0127  WBC 7.1 4.9 6.2  NEUTROABS 5.2  --  3.9  HGB 15.1* 14.3 14.0  HCT 40.7 38.2 38.4  MCV 87.0 87.0 88.3  PLT 241 184 197   Basic Metabolic Panel: Recent Labs  Lab 07/27/24 1818 07/27/24 2225 07/28/24 0600 07/28/24 1056 07/28/24 2208 07/29/24 0127 07/29/24 0540 07/29/24 1001 07/29/24 1450  NA 115*   < > 125*   < > 128* 127* 126* 123* 124*  K 3.6  --   --   --   --  3.3*  --   --   --   CL 81*  --   --   --   --  92*  --   --   --   CO2 20*  --   --   --   --  24  --   --   --   GLUCOSE 116*  --   --   --   --  112*  --   --   --   BUN 16  --   --   --   --  13  --   --   --   CREATININE 0.98  --  0.84  --   --  1.01*  --   --   --   CALCIUM  8.8*  --   --   --   --  8.4*  --   --   --   MG  --   --   --   --   --  2.0  --   --   --    < > = values in this interval not displayed.   GFR: Estimated Creatinine Clearance: 42.4 mL/min (A) (by C-G formula based on SCr of 1.01 mg/dL (H)). Liver Function Tests: Recent Labs  Lab 07/27/24 1818 07/29/24 0127  AST 22 16  ALT 19 18  ALKPHOS 54 50  BILITOT 0.9 0.8  PROT 6.8 5.6*   ALBUMIN 3.9 3.3*  Thyroid  Function Tests: Recent Labs    07/27/24 2207  TSH 2.384   Recent Results (from the past 240 hours)  MRSA Next Gen by PCR, Nasal     Status: None   Collection Time: 07/28/24  5:46 PM   Specimen: Nasal Mucosa; Nasal Swab  Result Value Ref Range Status   MRSA by PCR Next Gen NOT DETECTED NOT DETECTED Final    Comment: (NOTE) The GeneXpert MRSA Assay (FDA approved for NASAL specimens only), is one component of a comprehensive MRSA colonization surveillance program. It is not intended to diagnose MRSA infection nor to guide or monitor treatment for MRSA infections. Test performance is not FDA approved in patients less than 79 years old. Performed at [facility], [address] [place], [place] [address]      Radiology Studies: CT HEAD WO CONTRAST ( ) Result Date: 07/27/2024 CLINICAL DATA:  Altered mental status. EXAM: CT HEAD WITHOUT CONTRAST TECHNIQUE: Contiguous axial images were obtained from the base of the skull through the vertex without intravenous contrast. RADIATION DOSE REDUCTION: This exam was performed according to the departmental dose-optimization program which includes automated exposure control, adjustment of the mA and/or kV according to patient size and/or use of iterative reconstruction technique. COMPARISON:  February 20, 2018 FINDINGS: Brain: There is generalized cerebral atrophy with widening of the extra-axial spaces and ventricular dilatation. There are areas of decreased attenuation within the white matter tracts of the supratentorial brain, consistent with microvascular disease changes. Vascular: Mild bilateral cavernous carotid artery calcification is noted. Skull: Normal. Negative for fracture or focal lesion. Sinuses/Orbits: No acute finding. Other: None. IMPRESSION: 1. Generalized cerebral atrophy and microvascular disease changes of the supratentorial brain. 2. No acute intracranial abnormality. Electronically Signed   By:  Suzen Dials M.D.   On: 07/27/2024 19:35    Scheduled Meds:  diltiazem   120 mg Oral Daily   enoxaparin  (LOVENOX ) injection  40 mg Subcutaneous Q24H   famotidine   20 mg Oral QHS   ondansetron  (ZOFRAN ) IV  4 mg Intravenous Q6H   pantoprazole   40 mg Oral QPM   rosuvastatin   5 mg Oral Daily   sodium chloride   1 g Oral BID WC   Continuous Infusions:   LOS: 1 day   Time spent: 60 minutes  Camellia Door, DO  [facility]  07/29/2024, 4:40 PM

## 2024-07-29 NOTE — Evaluation (Signed)
 Physical Therapy Evaluation Patient Details Name: [name] MRN: [id number] DOB: [birth year] Today's Date: 07/29/2024  History of Present Illness  Pt is 78 yo presenting to [facility] on 8/25 due to multiple falls over the past several days. PMH: Adenomatous colon polyp, allergic rhinitis, Anemia, anxiety, appendicitis, arthritis, bradycardia sp MDT ppm, cataracts bil, clotting disorder, depression, esophageal stricture, GERD, heart murmur, hemorrhoids, hyperlipidemia, HTN, inguinal hernia, osteoporosis, PVC, scleritis of R eye, skin cancer, sleep apnea, subdural hematoma, thyroid  disease.  Clinical Impression  Pt is presenting below baseline level of functioning. Currently pt is Min A to CGA for bed mobility, CGA for sit to stand and short in-home distance gait with RW. Pt performing slow guarded gait with very narrow BOS and short partial step through reciprocal gait pattern. Spouse is present and supportive. Due to pt current functional status, home set up and available assistance at home recommending skilled physical therapy services 3x/week in order to address strength, balance and functional mobility to decrease risk for falls, injury and re-hospitalization.           If plan is discharge home, recommend the following: A little help with walking and/or transfers;Help with stairs or ramp for entrance;Assistance with cooking/housework;Assist for transportation     Equipment Recommendations Rolling walker (2 wheels)     Functional Status Assessment Patient has had a recent decline in their functional status and demonstrates the ability to make significant improvements in function in a reasonable and predictable amount of time.     Precautions / Restrictions Precautions Precautions: Fall Recall of Precautions/Restrictions: Intact Restrictions Weight Bearing Restrictions Per Provider Order: No      Mobility  Bed Mobility Overal bed mobility: Needs Assistance Bed Mobility: Supine to Sit,  Sit to Supine     Supine to sit: Min assist Sit to supine: Contact guard assist   General bed mobility comments: Min A for scooting to EOB, CGA for trunk to mid line with HOB elevated 30 degrees, CGA for sitting to supine    Transfers Overall transfer level: Needs assistance Equipment used: Rolling walker (2 wheels) Transfers: Sit to/from Stand Sit to Stand: Contact guard assist           General transfer comment: 2x from EOB. Pt initially stood up at [facility] then lost balance posteriorly and sat back down on bed. Second attempt stood again with improved balance, CGA to maintain balance. Verbal cues each time for safe hand placement.    Ambulation/Gait Ambulation/Gait assistance: Contact guard assist Gait Distance (Feet): 40 Feet Assistive device: Rolling walker (2 wheels) Gait Pattern/deviations: Step-through pattern, Decreased step length - right, Decreased step length - left, Narrow base of support Gait velocity: decreased Gait velocity interpretation: <1.31 ft/sec, indicative of household ambulator   General Gait Details: very short partial step through gait pattern with almost cross over intermittently into scissoring due to very narrow base of support.     Balance Overall balance assessment: Needs assistance Sitting-balance support: Single extremity supported, Feet supported Sitting balance-Leahy Scale: Fair     Standing balance support: Bilateral upper extremity supported, During functional activity, Reliant on assistive device for balance Standing balance-Leahy Scale: Fair Standing balance comment: CGA for safety due to poor balance initially sitting and Hx of falls. REquires Min to [employer] support on RW for balance.       Pertinent Vitals/Pain Pain Assessment Pain Assessment: No/denies pain    Home Living Family/patient expects to be discharged to:: Private residence Living Arrangements: Spouse/significant other Available Help  at Discharge: Family;Available 24  hours/day Type of Home: House Home Access: Stairs to enter Entrance Stairs-Rails: Doctor, general practice of Steps: 4   Home Layout: Able to live on main level with bedroom/bathroom;Two level Home Equipment: Crutches;Cane - single point;BSC/3in1      Prior Function Prior Level of Function : Independent/Modified Independent;History of Falls (last six months)             Mobility Comments: Pt is able to ambulate without an AD. Has had 2 falls in the past week due to weakness which is why pt is hospitalized ADLs Comments: ind/mod I with ADLs.     Extremity/Trunk Assessment   Upper Extremity Assessment Upper Extremity Assessment: Generalized weakness    Lower Extremity Assessment Lower Extremity Assessment: Generalized weakness    Cervical / Trunk Assessment Cervical / Trunk Assessment: Kyphotic  Communication   Communication Communication: No apparent difficulties    Cognition Arousal: Alert Behavior During Therapy: WFL for tasks assessed/performed   PT - Cognitive impairments: No apparent impairments     Following commands: Intact       Cueing Cueing Techniques: Verbal cues     General Comments General comments (skin integrity, edema, etc.): Spouse present throughout session. HR up to 112 bpm during gait.        Assessment/Plan    PT Assessment Patient needs continued PT services  PT Problem List Decreased strength;Decreased balance;Decreased activity tolerance;Decreased safety awareness;Decreased mobility       PT Treatment Interventions DME instruction;Therapeutic activities;Gait training;Therapeutic exercise;Patient/family education;Stair training;Functional mobility training;Balance training    PT Goals (Current goals can be found in the Care Plan section)  Acute Rehab PT Goals Patient Stated Goal: to improve fatigue PT Goal Formulation: With patient Time For Goal Achievement: 08/12/24 Potential to Achieve Goals: Fair    Frequency  Min 2X/week        AM-PAC PT 6 Clicks Mobility  Outcome Measure Help needed turning from your back to your side while in a flat bed without using bedrails?: A Little Help needed moving from lying on your back to sitting on the side of a flat bed without using bedrails?: A Little Help needed moving to and from a bed to a chair (including a wheelchair)?: A Little Help needed standing up from a chair using your arms (e.g., wheelchair or bedside chair)?: A Little Help needed to walk in hospital room?: A Little Help needed climbing 3-5 steps with a railing? : A Little 6 Click Score: 18    End of Session Equipment Utilized During Treatment: Gait belt Activity Tolerance: Patient tolerated treatment well Patient left: in bed;with call bell/phone within reach;with family/visitor present Nurse Communication: Mobility status PT Visit Diagnosis: Unsteadiness on feet (R26.81);Other abnormalities of gait and mobility (R26.89);Muscle weakness (generalized) (M62.81);History of falling (Z91.81)    Time: 1001-1047 PT Time Calculation (min) (ACUTE ONLY): 46 min   Charges:   PT Evaluation $PT Eval Low Complexity: 1 Low PT Treatments $Therapeutic Activity: 23-37 mins PT General Charges $$ ACUTE PT VISIT: 1 Visit        Dorothyann Maier, DPT, CLT  Acute Rehabilitation Services Office: [phone] (Secure chat preferred)   Dorothyann VEAR Maier 07/29/2024, 10:59 AM

## 2024-07-29 NOTE — Assessment & Plan Note (Addendum)
 07-29-2024 on crestor  5 mg daily.  07-30-2024 continue crestor .  07-31-2024 stable on crestor . Continue at home.

## 2024-07-29 NOTE — Assessment & Plan Note (Addendum)
 07-29-2024 continue with CPAP prn  07-30-2024 stable.  07-31-2024 stable.

## 2024-07-29 NOTE — Assessment & Plan Note (Addendum)
 07-29-2024 admitted on 07-27-2024. Received 3% saline by nephrology. Urine Na very low at <10. Appears her hyponatremia is due to low solute intake. Husband states that pt takes a low salt diet to the extreme. She does not eat a lot at home to begin with due to multiple food intolerance and GERD. Nephrology following. I encouraged her to eat salty snacks.  07-30-2024 Na up to 130 on salt tabs. Nephrology has decreased salt tabs to once a day. Repeat BMP in AM. Hopefully home tomorrow.  07-31-2024 Na remains at 130 x 2 days in a row. Given clearance for DC by nephrology.  Nephrology thinks her hyponatremia is due to low solute intake in combination with Cymbalta  therapy. Will stop cymbalta  at discharge. Continue 1 gram daily of salt tabs. F/u with PCP next week for repeat BMP. If Hyponatremia continues to be an issue, PCP can refer her back to nephrology as outpatient. Reminded pt and husband that pt needs to eat salty snacks each day. This may be difficult for patient due to her food intolerances and her general lack of appetite.

## 2024-07-29 NOTE — TOC CM/SW Note (Signed)
 Transition of Care [facility]) - Inpatient Brief Assessment   Patient Details  Name: [name] MRN: [id number] Date of Birth: 29-Jul-1946  Transition of Care [facility]) CM/SW Contact:    Lauraine FORBES Saa, LCSWA Phone Number: 07/29/2024, 9:01 AM   Clinical Narrative:  9:02 AM Per chart review, patient resides at home with spouse. Patient has a PCP and insurance. Patient does not have SNF history. Patient has HH history with [facility] and [facility]. Patient has DME (BSC, rolling walker, CPAP) history with [facility]. Patient's preferred pharmacy's are [facility] [address] and [facility] [phone] [place]. No TOC needs were identified at this time. TOC will continue to follow and be available to assist.  Transition of Care Asessment: Insurance and Status: Insurance coverage has been reviewed Patient has primary care physician: Yes Home environment has been reviewed: Private Residence Prior level of function:: N/A Prior/Current Home Services: No current home services Social Drivers of Health Review: SDOH reviewed no interventions necessary Readmission risk has been reviewed: Yes (Currently Green 13%) Transition of care needs: no transition of care needs at this time

## 2024-07-29 NOTE — Assessment & Plan Note (Addendum)
 07-29-2024 on PPI. Pt wants to restart her pepcid  20 mg bid.  07-30-2024 remains on qday protonix . And bid pepcid .  07-31-2024 stable. Remains on bid pepcid  and daily PPI.

## 2024-07-29 NOTE — Hospital Course (Addendum)
 CC: memory problems and recent fall HPI: [name] is a 78 y.o. female with history of hypertension, sick sinus syndrome status post pacemaker placement, chronic mild hyponatremia who has been experiencing fatigue for the last year and a half after patient had COVID-19 infection was placed on Cymbalta  for possible depression and had taken 2 doses on July 24, 2024.  The following day patient felt uncomfortable and also noticed that she has been having increased difficulty walking due to balance issues resulting in stumbling and falls.  Called her PCP and was instructed to come to the ER.  Patient states she has been eating less than usual and has been drinking water not more than usual.  Patient previously had hyponatremia from hydrochlorothiazide  which was discontinued many months ago.  Patient's sodium usually runs around 128-132.   ED Course: In the ER labs show sodium of 115.  Chloride 81 TSH 2.3 CT head unremarkable.  Urine osmolality was 306 urine sodium less than 10.  Nephrologist on-call was consulted and felt that patient's symptoms were consistent with severe hyponatremia was given 100 mL of 3% sodium chloride  and admitted for further management.  At the time of my exam patient is ambulating without difficulty.  Significant Events: Admitted 07/27/2024 acute on chronic hyponatremia   Admission Labs: WBC 7.1, HgB 15.1, plt 241 ETOH <15 Na 115, K 3.6, CO2 of 20, BUN 16, Scr 0.98, glu 116 T prot 6.8, alb 3.9, AST 22, ALT 19, alk phos 54, T. Bili 0.9 UDS Positive for Benzo  Admission Imaging Studies: CT head Generalized cerebral atrophy and microvascular disease changes of the supratentorial brain. 2. No acute intracranial abnormality.  Significant Labs: 07-27-2024 Urine Na <10, serum Na 119, serum osm 249, urine osm 306 07-27-2024 TSH 2.38 07-28-2024 AM cortisol 14.9  Significant Imaging Studies:   Antibiotic Therapy: Anti-infectives (From admission, onward)    None        Procedures:   Consultants: nephrology

## 2024-07-29 NOTE — Assessment & Plan Note (Addendum)
 07-29-2024 stable.  07-30-2024 stable.  07-31-2024 stable.

## 2024-07-29 NOTE — Progress Notes (Signed)
 Nephrology Follow-Up Consult note  Assessment/Recommendations: [name] is a/an 78 y.o. female with a past medical history significant for bradycardia s/p PPM, chronic mild hyponatremia, GERD recently started on PPI, chronic fatigue with recent initiation of Cymbalta , hypertension on diltiazem  , admitted for falls.     Nephrology consulted for hyponatremia.  Hyponatremia, severe, chronic, hypotonic, asymptomatic: POsm: 249 UOsm: 306 UNa: <10  Cortisol 14.9 TSH 2.38  -Initially felt to be SIADH from Cymbalta .  Received 3% hypertonic saline 100 mL + fluid restriction. -Repeat sodium 125.  Started D5W. - Once normalized, will reapproach slowly increasing sodium level.  Per husband, she takes Cymbalta  once.  Unsure if significantly contributing.  However would continue to hold at this point. No recent diuretic/HCTZ use. Patient states she has adhered to a strict low-sodium diet.  Recently with likely low solute intake diet as well. This goes against the high UOsm.  Sodium now stabilized.  Stop D5W.   Would start Ure-Na 15g daily Goal Na ~131 by 8/28 AM Treat nausea  Hypokalemia, mild: New. - Correcting hypokalemia could improve hyponatremia. - Defer to primary  Volume Status: Appears euvolemic on exam. Based on our examination and review of available imaging, our recommendation is as above.  Recommendations conveyed to primary service.    [name] 07/29/2024 6:56 AM  ___________________________________________________________  CC: Falls  Interval History/Subjective: No major events reported overnight.  Seen in bed.  States she is little tired.  No pain.  Having some nausea.  Otherwise doing well.  Medications:  Current Facility-Administered Medications  Medication Dose Route Frequency Provider Last Rate Last Admin   acetaminophen  (TYLENOL ) tablet 650 mg  650 mg Oral Q6H PRN Franky Redia SAILOR, MD       Or   acetaminophen   (TYLENOL ) suppository 650 mg  650 mg Rectal Q6H PRN Franky Redia SAILOR, MD       dextrose  5 % solution   Intravenous Continuous Dennise Hoes, MD 50 mL/hr at 07/28/24 2345 New Bag at 07/28/24 2345   diltiazem  (CARDIZEM  CD) 24 hr capsule 120 mg  120 mg Oral Daily Franky Redia SAILOR, MD   120 mg at 07/28/24 1038   enoxaparin  (LOVENOX ) injection 40 mg  40 mg Subcutaneous Q24H Franky Redia SAILOR, MD   40 mg at 07/28/24 1039   LORazepam  (ATIVAN ) tablet 0.5 mg  0.5 mg Oral Q6H PRN Franky Redia SAILOR, MD   0.5 mg at 07/29/24 9383   Oral care mouth rinse  15 mL Mouth Rinse PRN Cheryle Page, MD       pantoprazole  (PROTONIX ) EC tablet 40 mg  40 mg Oral Daily Franky Redia SAILOR, MD   40 mg at 07/28/24 1038   rosuvastatin  (CRESTOR ) tablet 5 mg  5 mg Oral Daily Franky Redia SAILOR, MD   5 mg at 07/28/24 1038      Review of Systems: 10 systems reviewed and negative except per interval history/subjective  Physical Exam: Vitals:   07/29/24 0415 07/29/24 0603  BP: (!) 154/87   Pulse: 62 68  Resp: 17 17  Temp: 98.4 F (36.9 C)   SpO2: 95% 94%   Total I/O In: 240 [P.O.:240] Out: 600 [Urine:600]  Intake/Output Summary (Last 24 hours) at 07/29/2024 0656 Last data filed at 07/29/2024 0417 Gross per 24 hour  Intake 546.63 ml  Output 1400 ml  Net -853.37 ml   Constitutional: well-appearing, no acute distress CV: RRR Respiratory: BLBS Gastrointestinal: soft, non-tender Skin: no visible lesions or rashes  Psych: alert, judgement/insight appropriate, appropriate mood and affect   Test Results I personally reviewed new and old clinical labs and radiology tests Lab Results  Component Value Date   NA 126 (L) 07/29/2024   K 3.3 (L) 07/29/2024   CL 92 (L) 07/29/2024   CO2 24 07/29/2024   BUN 13 07/29/2024   CREATININE 1.01 (H) 07/29/2024   GFR 61.42 01/31/2024   CALCIUM  8.4 (L) 07/29/2024   ALBUMIN 3.3 (L) 07/29/2024    CBC Recent Labs  Lab 07/27/24 1818 07/28/24 0600  07/29/24 0127  WBC 7.1 4.9 6.2  NEUTROABS 5.2  --  3.9  HGB 15.1* 14.3 14.0  HCT 40.7 38.2 38.4  MCV 87.0 87.0 88.3  PLT 241 184 197

## 2024-07-29 NOTE — Subjective & Objective (Signed)
 Pt seen and examined. Met with pt and husband chuck at bedside. Na remains at 130 x 2 days in a row. Given clearance for DC by nephrology.

## 2024-07-30 DIAGNOSIS — I10 Essential (primary) hypertension: Secondary | ICD-10-CM | POA: Diagnosis not present

## 2024-07-30 DIAGNOSIS — E7800 Pure hypercholesterolemia, unspecified: Secondary | ICD-10-CM | POA: Diagnosis not present

## 2024-07-30 DIAGNOSIS — E871 Hypo-osmolality and hyponatremia: Secondary | ICD-10-CM | POA: Diagnosis not present

## 2024-07-30 DIAGNOSIS — G4733 Obstructive sleep apnea (adult) (pediatric): Secondary | ICD-10-CM | POA: Diagnosis not present

## 2024-07-30 LAB — SODIUM: Sodium: 130 mmol/L — ABNORMAL LOW (ref 135–145)

## 2024-07-30 MED ORDER — SODIUM CHLORIDE 1 G PO TABS
1.0000 g | ORAL_TABLET | Freq: Every day | ORAL | Status: DC
  Administered 2024-07-31: 1 g via ORAL
  Filled 2024-07-30: qty 1

## 2024-07-30 NOTE — Evaluation (Addendum)
 Occupational Therapy Evaluation Patient Details Name: [name] MRN: [id number] DOB: 02/04/1946 Today's Date: 07/30/2024   History of Present Illness   Pt is 78 yo presenting to [facility] on 8/25 due to multiple falls over the past several days. Pt admitted due to hyponatromia. PMH: Adenomatous colon polyp, allergic rhinitis, Anemia, anxiety, appendicitis, arthritis, bradycardia sp MDT ppm, cataracts bil, clotting disorder, depression, esophageal stricture, GERD, heart murmur, hemorrhoids, hyperlipidemia, HTN, inguinal hernia, osteoporosis, PVC, scleritis of R eye, skin cancer, sleep apnea, subdural hematoma, thyroid  disease.     Clinical Impressions Pt at [facility] lives with husband and did not use any DME. Pt requires to go up 4 steps to enter the home but is able to live on the main level.  At this time pt completed UE bathing and dressing post set up in sitting and BLE dressing and bathing with CGA with cues on sequencing. She was able to ambulate in room level with RW and CGA to cue on how to position of walker in session. The pt and husband was educated on modification with tasks due to pt noted to need some cues for safety. Pt's husband reported they can support wife as needed with the return to home. At this time recommendation for [facility] therapy at discharge.       If plan is discharge home, recommend the following:   A little help with walking and/or transfers;A little help with bathing/dressing/bathroom;Assistance with cooking/housework;Direct supervision/assist for medications management;Direct supervision/assist for financial management;Assist for transportation;Help with stairs or ramp for entrance     Functional Status Assessment   Patient has had a recent decline in their functional status and demonstrates the ability to make significant improvements in function in a reasonable and predictable amount of time.     Equipment Recommendations   Tub/shower seat (RW)      Recommendations for Other Services         Precautions/Restrictions   Precautions Precautions: Fall Recall of Precautions/Restrictions: Intact Restrictions Weight Bearing Restrictions Per Provider Order: No     Mobility Bed Mobility Overal bed mobility: Modified Independent             General bed mobility comments: HOB elevated but no extra physical assist    Transfers Overall transfer level: Needs assistance Equipment used: Rolling walker (2 wheels) Transfers: Sit to/from Stand Sit to Stand: Contact guard assist           General transfer comment: increase in time      Balance Overall balance assessment: Needs assistance Sitting-balance support: Feet supported Sitting balance-Leahy Scale: Good     Standing balance support: Bilateral upper extremity supported, No upper extremity supported, Single extremity supported Standing balance-Leahy Scale: Fair Standing balance comment: CGA decrease awareness of balance in stadning                           ADL either performed or assessed with clinical judgement   ADL Overall ADL's : Needs assistance/impaired Eating/Feeding: Independent;Sitting   Grooming: Wash/dry face;Wash/dry hands;Oral care;Set up;Sitting   Upper Body Bathing: Contact guard assist;Sitting   Lower Body Bathing: Contact guard assist;Sit to/from stand   Upper Body Dressing : Contact guard assist;Sitting   Lower Body Dressing: Contact guard assist;Sitting/lateral leans   Toilet Transfer: Contact guard assist;Rolling walker (2 wheels)   Toileting- Clothing Manipulation and Hygiene: Contact guard assist;Sit to/from stand       Functional mobility during ADLs: Contact guard assist;Rolling walker (2 wheels)  Vision         Perception         Praxis         Pertinent Vitals/Pain Pain Assessment Pain Assessment: No/denies pain     Extremity/Trunk Assessment Upper Extremity Assessment Upper Extremity  Assessment: Generalized weakness (Pt reported problems with their back which then affect BUE at times)   Lower Extremity Assessment Lower Extremity Assessment: Defer to PT evaluation   Cervical / Trunk Assessment Cervical / Trunk Assessment: Kyphotic   Communication Communication Communication: No apparent difficulties   Cognition Arousal: Alert Behavior During Therapy: WFL for tasks assessed/performed Cognition: Cognition impaired         Attention impairment (select first level of impairment): Divided attention Executive functioning impairment (select all impairments): Problem solving, Reasoning OT - Cognition Comments: Pt noted to take increase in time to complete tasks and then noted they could intiate but was often questioning the next step in basic self care                 Following commands: Intact       Cueing  General Comments   Cueing Techniques: Verbal cues  husband present and reviewed on modifications of tasks, educated about have prep patient prior to if needed to leave the home for any emergency as recmendation to have constant CGA   Exercises     Shoulder Instructions      Home Living Family/patient expects to be discharged to:: Private residence Living Arrangements: Spouse/significant other Available Help at Discharge: Family;Available 24 hours/day Type of Home: House Home Access: Stairs to enter [employer] of Steps: 4 Entrance Stairs-Rails: Right;Left Home Layout: Able to live on main level with bedroom/bathroom;Two level     Bathroom Shower/Tub: Producer, television/film/video: Standard Bathroom Accessibility: Yes How Accessible: Accessible via wheelchair Home Equipment: Crutches;Cane - single point;BSC/3in1          Prior Functioning/Environment Prior Level of Function : Independent/Modified Independent;History of Falls (last six months)             Mobility Comments: Pt is able to ambulate without an AD. Has  had 2 falls in the past week due to weakness which is why pt is hospitalized ADLs Comments: ind/mod I with ADLs.    OT Problem List: Decreased strength;Decreased activity tolerance;Impaired balance (sitting and/or standing);Decreased safety awareness;Decreased knowledge of use of DME or AE   OT Treatment/Interventions: Self-care/ADL training;Therapeutic exercise;DME and/or AE instruction;Therapeutic activities;Patient/family education;Balance training      OT Goals(Current goals can be found in the care plan section)   Acute Rehab OT Goals Patient Stated Goal: to get better OT Goal Formulation: With patient Time For Goal Achievement: 08/14/24 Potential to Achieve Goals: Good   OT Frequency:  Min 2X/week    Co-evaluation              AM-PAC OT 6 Clicks Daily Activity     Outcome Measure Help from another person eating meals?: None Help from another person taking care of personal grooming?: A Little Help from another person toileting, which includes using toliet, bedpan, or urinal?: A Little Help from another person bathing (including washing, rinsing, drying)?: A Little Help from another person to put on and taking off regular upper body clothing?: None Help from another person to put on and taking off regular lower body clothing?: A Little 6 Click Score: 20   End of Session Equipment Utilized During Treatment: Rolling walker (2 wheels);Gait belt Nurse Communication: Mobility  status  Activity Tolerance: Patient tolerated treatment well Patient left: in bed;with call bell/phone within reach;with family/visitor present  OT Visit Diagnosis: Unsteadiness on feet (R26.81);Other abnormalities of gait and mobility (R26.89);Repeated falls (R29.6);Muscle weakness (generalized) (M62.81)                Time: 1000-1103 OT Time Calculation (min): 63 min Charges:  OT General Charges $OT Visit: 1 Visit OT Evaluation $OT Eval Low Complexity: 1 Low OT Treatments $Self Care/Home  Management : 38-52 mins  Warrick POUR OTR/L  Acute Rehab Services  [phone] office number   Warrick Berber 07/30/2024, 11:54 AM

## 2024-07-30 NOTE — Progress Notes (Signed)
 PROGRESS NOTE    [name]  FMW:[id number] DOB: 1946-05-29 DOA: 07/27/2024 PCP: Cleatus Arlyss RAMAN, MD  Subjective: Pt seen and examined. Met with pt and husband [name] at bedside.  Did not tolerate Urea  liquid yesterday. Nephrology changed her to po salt tabs.  Na up to 130. Nephrology wants to watch her one more day.  Husband states that pt has always been a poor eater due to food intolerances.   Hospital Course: CC: memory problems and recent fall HPI: [name] is a 78 y.o. female with history of hypertension, sick sinus syndrome status post pacemaker placement, chronic mild hyponatremia who has been experiencing fatigue for the last year and a half after patient had COVID-19 infection was placed on Cymbalta  for possible depression and had taken 2 doses on July 24, 2024.  The following day patient felt uncomfortable and also noticed that she has been having increased difficulty walking due to balance issues resulting in stumbling and falls.  Called her PCP and was instructed to come to the ER.  Patient states she has been eating less than usual and has been drinking water not more than usual.  Patient previously had hyponatremia from hydrochlorothiazide  which was discontinued many months ago.  Patient's sodium usually runs around 128-132.   ED Course: In the ER labs show sodium of 115.  Chloride 81 TSH 2.3 CT head unremarkable.  Urine osmolality was 306 urine sodium less than 10.  Nephrologist on-call was consulted and felt that patient's symptoms were consistent with severe hyponatremia was given 100 mL of 3% sodium chloride  and admitted for further management.  At the time of my exam patient is ambulating without difficulty.  Significant Events: Admitted 07/27/2024 acute on chronic hyponatremia   Admission Labs: WBC 7.1, HgB 15.1, plt 241 ETOH <15 Na 115, K 3.6, CO2 of 20, BUN 16, Scr 0.98, glu 116 T prot 6.8, alb 3.9, AST 22, ALT 19, alk phos 54, T. Bili 0.9 UDS Positive  for Benzo  Admission Imaging Studies: CT head Generalized cerebral atrophy and microvascular disease changes of the supratentorial brain. 2. No acute intracranial abnormality.  Significant Labs: 07-27-2024 Urine Na <10, serum Na 119, serum osm 249, urine osm 306 07-27-2024 TSH 2.38 07-28-2024 AM cortisol 14.9  Significant Imaging Studies:   Antibiotic Therapy: Anti-infectives (From admission, onward)    None       Procedures:   Consultants: nephrology    Assessment and Plan: * Hyponatremia 07-29-2024 admitted on 07-27-2024. Received 3% saline by nephrology. Urine Na very low at <10. Appears her hyponatremia is due to low solute intake. Husband states that pt takes a low salt diet to the extreme. She does not eat a lot at home to begin with due to multiple food intolerance and GERD. Nephrology following. I encouraged her to eat salty snacks.  07-30-2024 Na up to 130 on salt tabs. Nephrology has decreased salt tabs to once a day. Repeat BMP in AM. Hopefully home tomorrow.  Reflux esophagitis 07-29-2024 on PPI. Pt wants to restart her pepcid  20 mg bid.  07-30-2024 remains on qday protonix . And bid pepcid .  Essential hypertension 07-29-2024 on cardizem  CD  07-30-2024 stable. On cardizem  cd 120 mg daily.  Pacemaker-[employer]tronic 07-29-2024 stable.  07-30-2024 stable.  Obstructive sleep apnea 07-29-2024 continue with CPAP prn  07-30-2024 stable.  Hypercholesterolemia 07-29-2024 on crestor  5 mg daily.  07-30-2024 continue crestor .  DVT prophylaxis: enoxaparin  (LOVENOX ) injection 40 mg Start: 07/28/24 1000    Code Status: Full Code  Family Communication: discussed with husband [name] and pt at bedside Disposition Plan: return home Reason for continuing need for hospitalization: monitoring Na.  Objective: Vitals:   07/29/24 1936 07/29/24 2309 07/30/24 0745 07/30/24 0918  BP: 127/70 133/76 139/73 (!) 145/67  Pulse: 64 (!) 57 66   Resp: 20 20 14    Temp: 98.2 F  (36.8 C) 98.2 F (36.8 C) 97.7 F (36.5 C)   TempSrc: Oral Oral Oral   SpO2: 96% 96% 94%   Weight:      Height:        Intake/Output Summary (Last 24 hours) at 07/30/2024 1152 Last data filed at 07/30/2024 0700 Gross per 24 hour  Intake 700 ml  Output 2900 ml  Net -2200 ml   Filed Weights   07/27/24 1748 07/28/24 1738 07/29/24 0603  Weight: 63 kg 63 kg 63.1 kg    Examination:  Physical Exam Vitals and nursing note reviewed.  Constitutional:      General: She is not in acute distress.    Appearance: She is normal weight. She is not toxic-appearing or diaphoretic.  HENT:     Head: Normocephalic and atraumatic.     Nose: Nose normal.  Cardiovascular:     Rate and Rhythm: Normal rate and regular rhythm.  Pulmonary:     Effort: Pulmonary effort is normal. No respiratory distress.     Breath sounds: Normal breath sounds. No wheezing.  Abdominal:     General: Abdomen is flat. Bowel sounds are normal.     Palpations: Abdomen is soft.  Musculoskeletal:     Right lower leg: No edema.     Left lower leg: No edema.  Skin:    General: Skin is warm and dry.     Capillary Refill: Capillary refill takes less than 2 seconds.  Neurological:     General: No focal deficit present.     Mental Status: She is alert and oriented to person, place, and time.    Data Reviewed: I have personally reviewed following labs and imaging studies  CBC: Recent Labs  Lab 07/27/24 1818 07/28/24 0600 07/29/24 0127  WBC 7.1 4.9 6.2  NEUTROABS 5.2  --  3.9  HGB 15.1* 14.3 14.0  HCT 40.7 38.2 38.4  MCV 87.0 87.0 88.3  PLT 241 184 197   Basic Metabolic Panel: Recent Labs  Lab 07/27/24 1818 07/27/24 2225 07/28/24 0600 07/28/24 1056 07/29/24 0127 07/29/24 0540 07/29/24 1001 07/29/24 1450 07/29/24 1943 07/30/24 0823  NA 115*   < > 125*   < > 127* 126* 123* 124* 124* 130*  K 3.6  --   --   --  3.3*  --   --   --   --   --   CL 81*  --   --   --  92*  --   --   --   --   --   CO2 20*   --   --   --  24  --   --   --   --   --   GLUCOSE 116*  --   --   --  112*  --   --   --   --   --   BUN 16  --   --   --  13  --   --   --   --   --   CREATININE 0.98  --  0.84  --  1.01*  --   --   --   --   --  CALCIUM  8.8*  --   --   --  8.4*  --   --   --   --   --   MG  --   --   --   --  2.0  --   --   --   --   --    < > = values in this interval not displayed.   GFR: Estimated Creatinine Clearance: 42.4 mL/min (A) (by C-G formula based on SCr of 1.01 mg/dL (H)). Liver Function Tests: Recent Labs  Lab 07/27/24 1818 07/29/24 0127  AST 22 16  ALT 19 18  ALKPHOS 54 50  BILITOT 0.9 0.8  PROT 6.8 5.6*  ALBUMIN 3.9 3.3*   Thyroid  Function Tests: Recent Labs    07/27/24 2207  TSH 2.384   Recent Results (from the past 240 hours)  MRSA Next Gen by PCR, Nasal     Status: None   Collection Time: 07/28/24  5:46 PM   Specimen: Nasal Mucosa; Nasal Swab  Result Value Ref Range Status   MRSA by PCR Next Gen NOT DETECTED NOT DETECTED Final    Comment: (NOTE) The GeneXpert MRSA Assay (FDA approved for NASAL specimens only), is one component of a comprehensive MRSA colonization surveillance program. It is not intended to diagnose MRSA infection nor to guide or monitor treatment for MRSA infections. Test performance is not FDA approved in patients less than 37 years old. Performed at [facility], [address], [place], [place] [address]      Radiology Studies: No results found.  Scheduled Meds:  diltiazem   120 mg Oral Daily   enoxaparin  (LOVENOX ) injection  40 mg Subcutaneous Q24H   famotidine   20 mg Oral QHS   ondansetron  (ZOFRAN ) IV  4 mg Intravenous Q6H   pantoprazole   40 mg Oral QPM   rosuvastatin   5 mg Oral Daily   [START ON 07/31/2024] sodium chloride   1 g Oral Q breakfast   Continuous Infusions:   LOS: 2 days   Time spent: 55 minutes  Camellia Door, DO  [facility]  07/30/2024, 11:52 AM

## 2024-07-30 NOTE — Progress Notes (Signed)
 Nephrology Follow-Up Consult note  Assessment/Recommendations: [name] is a/an 78 y.o. female with a past medical history significant for bradycardia s/p PPM, chronic mild hyponatremia, GERD recently started on PPI, chronic fatigue with recent initiation of Cymbalta , hypertension on diltiazem  , admitted for falls.     Nephrology consulted for hyponatremia.  Hyponatremia, severe, chronic, hypotonic, asymptomatic: Improving POsm: 249 UOsm: 306 UNa: <10  Cortisol 14.9 TSH 2.38  -Initially felt to be SIADH from Cymbalta .  Received 3% hypertonic saline 100 mL + fluid restriction. -Repeat sodium 125.  Started D5W. - Once normalized, will reapproach slowly increasing sodium level.  Per husband, she takes Cymbalta  once.  Unsure if significantly contributing.  However would continue to hold at this point. No recent diuretic/HCTZ use. Patient states she has adhered to a strict low-sodium diet.  Recently with likely low solute intake diet as well. This goes against the high UOsm.  Sodium now improved.  Stop D5W.   Did not tolerate Urena Continue PO Salt tab 1g BID -> every day Daily labs. Goal Na ~135 by 8/28 AM Treat nausea  Hypokalemia, mild: New. - Correcting hypokalemia could improve hyponatremia. - Defer to primary  Volume Status: Appears euvolemic on exam. Based on our examination and review of available imaging, our recommendation is as above.  Recommendations conveyed to primary service.    [name] 07/30/2024 6:54 AM  ___________________________________________________________  CC: Falls  Interval History/Subjective: Did not tolerate Urena yesterday.  Transition to salt tablets.  No other major events reported overnight.  Seen in room.  Husband at bedside.  Conversational.  At baseline.  Medications:  Current Facility-Administered Medications  Medication Dose Route Frequency Provider Last Rate Last Admin   acetaminophen   (TYLENOL ) tablet 650 mg  650 mg Oral Q6H PRN Franky Redia SAILOR, MD       Or   acetaminophen  (TYLENOL ) suppository 650 mg  650 mg Rectal Q6H PRN Franky Redia SAILOR, MD       diltiazem  (CARDIZEM  CD) 24 hr capsule 120 mg  120 mg Oral Daily Kakrakandy, Arshad N, MD   120 mg at 07/29/24 9092   enoxaparin  (LOVENOX ) injection 40 mg  40 mg Subcutaneous Q24H Franky Redia SAILOR, MD   40 mg at 07/29/24 9092   famotidine  (PEPCID ) tablet 20 mg  20 mg Oral QHS Laurence Locus, DO   20 mg at 07/30/24 9350   LORazepam  (ATIVAN ) tablet 0.5 mg  0.5 mg Oral Q6H PRN Kakrakandy, Arshad N, MD   0.5 mg at 07/30/24 9350   ondansetron  (ZOFRAN ) injection 4 mg  4 mg Intravenous Q6H Laurence Locus, DO   4 mg at 07/29/24 1636   Oral care mouth rinse  15 mL Mouth Rinse PRN Cheryle Page, MD       pantoprazole  (PROTONIX ) EC tablet 40 mg  40 mg Oral QPM Laurence Locus, DO   40 mg at 07/29/24 2144   rosuvastatin  (CRESTOR ) tablet 5 mg  5 mg Oral Daily Kakrakandy, Arshad N, MD   5 mg at 07/29/24 9092   sodium chloride  tablet 1 g  1 g Oral BID WC Lewie Deman M, DO   1 g at 07/30/24 9350      Review of Systems: 10 systems reviewed and negative except per interval history/subjective  Physical Exam: Vitals:   07/29/24 1936 07/29/24 2309  BP: 127/70 133/76  Pulse: 64 (!) 57  Resp: 20 20  Temp: 98.2 F (36.8 C) 98.2 F (36.8 C)  SpO2: 96%  96%   Total I/O In: 240 [P.O.:240] Out: 700 [Urine:700]  Intake/Output Summary (Last 24 hours) at 07/30/2024 0654 Last data filed at 07/29/2024 2100 Gross per 24 hour  Intake 1178.08 ml  Output 2500 ml  Net -1321.92 ml   Constitutional: well-appearing, no acute distress CV: RRR Respiratory: BLBS Gastrointestinal: soft, non-tender Skin: no visible lesions or rashes Psych: alert, judgement/insight appropriate, appropriate mood and affect   Test Results I personally reviewed new and old clinical labs and radiology tests Lab Results  Component Value Date   NA 124 (L) 07/29/2024    K 3.3 (L) 07/29/2024   CL 92 (L) 07/29/2024   CO2 24 07/29/2024   BUN 13 07/29/2024   CREATININE 1.01 (H) 07/29/2024   GFR 61.42 01/31/2024   CALCIUM  8.4 (L) 07/29/2024   ALBUMIN 3.3 (L) 07/29/2024    CBC Recent Labs  Lab 07/27/24 1818 07/28/24 0600 07/29/24 0127  WBC 7.1 4.9 6.2  NEUTROABS 5.2  --  3.9  HGB 15.1* 14.3 14.0  HCT 40.7 38.2 38.4  MCV 87.0 87.0 88.3  PLT 241 184 197

## 2024-07-31 ENCOUNTER — Other Ambulatory Visit (HOSPITAL_COMMUNITY): Payer: Self-pay

## 2024-07-31 DIAGNOSIS — I10 Essential (primary) hypertension: Secondary | ICD-10-CM | POA: Diagnosis not present

## 2024-07-31 DIAGNOSIS — G4733 Obstructive sleep apnea (adult) (pediatric): Secondary | ICD-10-CM | POA: Diagnosis not present

## 2024-07-31 DIAGNOSIS — E871 Hypo-osmolality and hyponatremia: Secondary | ICD-10-CM | POA: Diagnosis not present

## 2024-07-31 DIAGNOSIS — E7800 Pure hypercholesterolemia, unspecified: Secondary | ICD-10-CM | POA: Diagnosis not present

## 2024-07-31 LAB — BASIC METABOLIC PANEL WITH GFR
Anion gap: 11 (ref 5–15)
BUN: 12 mg/dL (ref 8–23)
CO2: 24 mmol/L (ref 22–32)
Calcium: 8.2 mg/dL — ABNORMAL LOW (ref 8.9–10.3)
Chloride: 95 mmol/L — ABNORMAL LOW (ref 98–111)
Creatinine, Ser: 1.01 mg/dL — ABNORMAL HIGH (ref 0.44–1.00)
GFR, Estimated: 58 mL/min — ABNORMAL LOW (ref >60)
Glucose, Bld: 101 mg/dL — ABNORMAL HIGH (ref 70–99)
Potassium: 4.2 mmol/L (ref 3.5–5.1)
Sodium: 130 mmol/L — ABNORMAL LOW (ref 135–145)

## 2024-07-31 MED ORDER — ONDANSETRON HCL 4 MG PO TABS
4.0000 mg | ORAL_TABLET | Freq: Four times a day (QID) | ORAL | 0 refills | Status: AC | PRN
  Filled 2024-07-31: qty 120, 30d supply, fill #0

## 2024-07-31 MED ORDER — SODIUM CHLORIDE 1 G PO TABS
1.0000 g | ORAL_TABLET | Freq: Every day | ORAL | 0 refills | Status: DC
  Filled 2024-07-31: qty 90, 90d supply, fill #0

## 2024-07-31 NOTE — Progress Notes (Signed)
 Nephrology Follow-Up Consult note  Assessment/Recommendations: [name] is a/an 78 y.o. female with a past medical history significant for bradycardia s/p PPM, chronic mild hyponatremia, GERD recently started on PPI, chronic fatigue with recent initiation of Cymbalta , hypertension on diltiazem  , admitted for falls.     Nephrology consulted for hyponatremia.  Hyponatremia, severe, chronic, hypotonic, asymptomatic: Stable POsm: 249 UOsm: 306 UNa: <10  Cortisol 14.9 TSH 2.38  -Initially felt to be SIADH from Cymbalta .  Received 3% hypertonic saline 100 mL + fluid restriction. -Repeat sodium 125.  Started D5W. - Once normalized, will reapproach slowly increasing sodium level.  Per husband, she takes Cymbalta  once.  Unsure if significantly contributing.  However would continue to hold at this point. No recent diuretic/HCTZ use. Patient states she has adhered to a strict low-sodium diet.  Recently with likely low solute intake diet as well. This goes against the high UOsm.  Sodium now improved/stable.  Stop D5W.   Did not tolerate Urena  At this point she remains asymptomatic.  Sodium labs stable since yesterday.  She has good support with her husband, and establish primary care, it is acceptable for her to be discharged today.  She should obtain repeat BMP early next week and have close hospital follow-up with her PCP to continue managing this hyponatremia.   Continue PO Salt tab 1g every day. Would avoid Cymbalta  at this point.   Hypokalemia, mild: Resolved. - Correcting hypokalemia could improve hyponatremia. - Defer to primary  Volume Status: Appears euvolemic on exam. Based on our examination and review of available imaging, our recommendation is as above.  Recommendations conveyed to primary service.    Evalene HERO Rajan Burgard [facility] 07/31/2024 6:57 AM  ___________________________________________________________  CC: Falls  Interval History/Subjective:  No major events reported overnight.  Seen in bed.  Husband at bedside.  Asymptomatic.  Doing well.  Wanting to go home.  Medications:  Current Facility-Administered Medications  Medication Dose Route Frequency Provider Last Rate Last Admin   acetaminophen  (TYLENOL ) tablet 650 mg  650 mg Oral Q6H PRN Franky Redia SAILOR, MD       Or   acetaminophen  (TYLENOL ) suppository 650 mg  650 mg Rectal Q6H PRN Franky Redia SAILOR, MD       diltiazem  (CARDIZEM  CD) 24 hr capsule 120 mg  120 mg Oral Daily Kakrakandy, Arshad N, MD   120 mg at 07/30/24 9081   enoxaparin  (LOVENOX ) injection 40 mg  40 mg Subcutaneous Q24H Franky Redia SAILOR, MD   40 mg at 07/30/24 9081   famotidine  (PEPCID ) tablet 20 mg  20 mg Oral QHS Laurence Locus, DO   20 mg at 07/30/24 9350   LORazepam  (ATIVAN ) tablet 0.5 mg  0.5 mg Oral Q6H PRN Kakrakandy, Arshad N, MD   0.5 mg at 07/31/24 0425   ondansetron  (ZOFRAN ) injection 4 mg  4 mg Intravenous Q6H Laurence Locus, DO   4 mg at 07/30/24 1300   Oral care mouth rinse  15 mL Mouth Rinse PRN Cheryle Page, MD       pantoprazole  (PROTONIX ) EC tablet 40 mg  40 mg Oral QPM Laurence Locus, DO   40 mg at 07/30/24 2206   rosuvastatin  (CRESTOR ) tablet 5 mg  5 mg Oral Daily Kakrakandy, Arshad N, MD   5 mg at 07/30/24 9081   sodium chloride  tablet 1 g  1 g Oral Q breakfast Elverda Wendel M, DO          Review of Systems: 10 systems  reviewed and negative except per interval history/subjective  Physical Exam: Vitals:   07/30/24 2230 07/31/24 0409  BP: (!) 104/55 (!) 161/80  Pulse: 64 62  Resp: 20 20  Temp: 98.1 F (36.7 C) 98.4 F (36.9 C)  SpO2: 96% 93%   Total I/O In: 480 [P.O.:480] Out: 950 [Urine:950]  Intake/Output Summary (Last 24 hours) at 07/31/2024 9342 Last data filed at 07/31/2024 0600 Gross per 24 hour  Intake 600 ml  Output 2800 ml  Net -2200 ml   Constitutional: well-appearing, no acute distress CV: RRR Respiratory: BLBS Gastrointestinal: soft, non-tender Skin: no  visible lesions or rashes Psych: alert, judgement/insight appropriate, appropriate mood and affect   Test Results I personally reviewed new and old clinical labs and radiology tests Lab Results  Component Value Date   NA 130 (L) 07/31/2024   K 4.2 07/31/2024   CL 95 (L) 07/31/2024   CO2 24 07/31/2024   BUN 12 07/31/2024   CREATININE 1.01 (H) 07/31/2024   GFR 61.42 01/31/2024   CALCIUM  8.2 (L) 07/31/2024   ALBUMIN 3.3 (L) 07/29/2024    CBC Recent Labs  Lab 07/27/24 1818 07/28/24 0600 07/29/24 0127  WBC 7.1 4.9 6.2  NEUTROABS 5.2  --  3.9  HGB 15.1* 14.3 14.0  HCT 40.7 38.2 38.4  MCV 87.0 87.0 88.3  PLT 241 184 197

## 2024-07-31 NOTE — Progress Notes (Signed)
 PROGRESS NOTE    [name]  FMW:[id number] DOB: 18-Aug-1946 DOA: 07/27/2024 PCP: Cleatus Arlyss RAMAN, MD  Subjective: Pt seen and examined. Met with pt and husband chuck at bedside. Na remains at 130 x 2 days in a row. Given clearance for DC by nephrology.   Hospital Course: CC: memory problems and recent fall HPI: [name] is a 78 y.o. female with history of hypertension, sick sinus syndrome status post pacemaker placement, chronic mild hyponatremia who has been experiencing fatigue for the last year and a half after patient had COVID-19 infection was placed on Cymbalta  for possible depression and had taken 2 doses on July 24, 2024.  The following day patient felt uncomfortable and also noticed that she has been having increased difficulty walking due to balance issues resulting in stumbling and falls.  Called her PCP and was instructed to come to the ER.  Patient states she has been eating less than usual and has been drinking water not more than usual.  Patient previously had hyponatremia from hydrochlorothiazide  which was discontinued many months ago.  Patient's sodium usually runs around 128-132.   ED Course: In the ER labs show sodium of 115.  Chloride 81 TSH 2.3 CT head unremarkable.  Urine osmolality was 306 urine sodium less than 10.  Nephrologist on-call was consulted and felt that patient's symptoms were consistent with severe hyponatremia was given 100 mL of 3% sodium chloride  and admitted for further management.  At the time of my exam patient is ambulating without difficulty.  Significant Events: Admitted 07/27/2024 acute on chronic hyponatremia   Admission Labs: WBC 7.1, HgB 15.1, plt 241 ETOH <15 Na 115, K 3.6, CO2 of 20, BUN 16, Scr 0.98, glu 116 T prot 6.8, alb 3.9, AST 22, ALT 19, alk phos 54, T. Bili 0.9 UDS Positive for Benzo  Admission Imaging Studies: CT head Generalized cerebral atrophy and microvascular disease changes of the supratentorial brain. 2. No  acute intracranial abnormality.  Significant Labs: 07-27-2024 Urine Na <10, serum Na 119, serum osm 249, urine osm 306 07-27-2024 TSH 2.38 07-28-2024 AM cortisol 14.9  Significant Imaging Studies:   Antibiotic Therapy: Anti-infectives (From admission, onward)    None       Procedures:   Consultants: nephrology    Assessment and Plan: * Hyponatremia 07-29-2024 admitted on 07-27-2024. Received 3% saline by nephrology. Urine Na very low at <10. Appears her hyponatremia is due to low solute intake. Husband states that pt takes a low salt diet to the extreme. She does not eat a lot at home to begin with due to multiple food intolerance and GERD. Nephrology following. I encouraged her to eat salty snacks.  07-30-2024 Na up to 130 on salt tabs. Nephrology has decreased salt tabs to once a day. Repeat BMP in AM. Hopefully home tomorrow.  07-31-2024 Na remains at 130 x 2 days in a row. Given clearance for DC by nephrology.  Nephrology thinks her hyponatremia is due to low solute intake in combination with Cymbalta  therapy. Will stop cymbalta  at discharge. Continue 1 gram daily of salt tabs. F/u with PCP next week for repeat BMP. If Hyponatremia continues to be an issue, PCP can refer her back to nephrology as outpatient. Reminded pt and husband that pt needs to eat salty snacks each day. This may be difficult for patient due to her food intolerances and her general lack of appetite.  Reflux esophagitis 07-29-2024 on PPI. Pt wants to restart her pepcid  20 mg bid.  07-30-2024 remains on qday protonix . And bid pepcid .  07-31-2024 stable. Remains on bid pepcid  and daily PPI.  Essential hypertension 07-29-2024 on cardizem  CD  07-30-2024 stable. On cardizem  cd 120 mg daily.  07-31-2024 stable on cardizem  CD 120 mg daily. Continue at home.  Pacemaker-Medtronic 07-29-2024 stable.  07-30-2024 stable.  07-31-2024 stable.  Obstructive sleep apnea 07-29-2024 continue with CPAP  prn  07-30-2024 stable.  07-31-2024 stable.  Hypercholesterolemia 07-29-2024 on crestor  5 mg daily.  07-30-2024 continue crestor .  07-31-2024 stable on crestor . Continue at home.  DVT prophylaxis: enoxaparin  (LOVENOX ) injection 40 mg Start: 07/28/24 1000    Code Status: Full Code Family Communication: discussed with pt and husband at bedside Disposition Plan: home Reason for continuing need for hospitalization: stable for DC.  Objective: Vitals:   07/31/24 0409 07/31/24 0810 07/31/24 0900 07/31/24 0942  BP: (!) 161/80 (!) 156/79  (!) 151/75  Pulse: 62 61    Resp: 20 19    Temp: 98.4 F (36.9 C) 98.4 F (36.9 C)    TempSrc: Oral Oral    SpO2: 93% 95%    Weight:   62.9 kg   Height:        Intake/Output Summary (Last 24 hours) at 07/31/2024 1048 Last data filed at 07/31/2024 0900 Gross per 24 hour  Intake 960 ml  Output 2650 ml  Net -1690 ml   Filed Weights   07/29/24 0603 07/30/24 1500 07/31/24 0900  Weight: 63.1 kg 62.8 kg 62.9 kg    Examination:  Physical Exam Vitals and nursing note reviewed.  Constitutional:      General: She is not in acute distress.    Appearance: She is normal weight. She is not toxic-appearing or diaphoretic.  HENT:     Head: Normocephalic and atraumatic.     Nose: Nose normal.  Cardiovascular:     Rate and Rhythm: Normal rate and regular rhythm.  Pulmonary:     Effort: Pulmonary effort is normal.     Breath sounds: Normal breath sounds.  Abdominal:     General: Abdomen is flat. Bowel sounds are normal. There is no distension.     Palpations: Abdomen is soft.  Musculoskeletal:     Right lower leg: No edema.     Left lower leg: No edema.  Skin:    General: Skin is warm and dry.     Capillary Refill: Capillary refill takes less than 2 seconds.  Neurological:     General: No focal deficit present.     Mental Status: She is alert and oriented to person, place, and time.     Data Reviewed: I have personally reviewed  following labs and imaging studies  CBC: Recent Labs  Lab 07/27/24 1818 07/28/24 0600 07/29/24 0127  WBC 7.1 4.9 6.2  NEUTROABS 5.2  --  3.9  HGB 15.1* 14.3 14.0  HCT 40.7 38.2 38.4  MCV 87.0 87.0 88.3  PLT 241 184 197   Basic Metabolic Panel: Recent Labs  Lab 07/27/24 1818 07/27/24 2225 07/28/24 0600 07/28/24 1056 07/29/24 0127 07/29/24 0540 07/29/24 1001 07/29/24 1450 07/29/24 1943 07/30/24 0823 07/31/24 0213  NA 115*   < > 125*   < > 127*   < > 123* 124* 124* 130* 130*  K 3.6  --   --   --  3.3*  --   --   --   --   --  4.2  CL 81*  --   --   --  92*  --   --   --   --   --  95*  CO2 20*  --   --   --  24  --   --   --   --   --  24  GLUCOSE 116*  --   --   --  112*  --   --   --   --   --  101*  BUN 16  --   --   --  13  --   --   --   --   --  12  CREATININE 0.98  --  0.84  --  1.01*  --   --   --   --   --  1.01*  CALCIUM  8.8*  --   --   --  8.4*  --   --   --   --   --  8.2*  MG  --   --   --   --  2.0  --   --   --   --   --   --    < > = values in this interval not displayed.   GFR: Estimated Creatinine Clearance: 42.3 mL/min (A) (by C-G formula based on SCr of 1.01 mg/dL (H)). Liver Function Tests: Recent Labs  Lab 07/27/24 1818 07/29/24 0127  AST 22 16  ALT 19 18  ALKPHOS 54 50  BILITOT 0.9 0.8  PROT 6.8 5.6*  ALBUMIN 3.9 3.3*   Recent Results (from the past 240 hours)  MRSA Next Gen by PCR, Nasal     Status: None   Collection Time: 07/28/24  5:46 PM   Specimen: Nasal Mucosa; Nasal Swab  Result Value Ref Range Status   MRSA by PCR Next Gen NOT DETECTED NOT DETECTED Final    Comment: (NOTE) The GeneXpert MRSA Assay (FDA approved for NASAL specimens only), is one component of a comprehensive MRSA colonization surveillance program. It is not intended to diagnose MRSA infection nor to guide or monitor treatment for MRSA infections. Test performance is not FDA approved in patients less than 25 years old. Performed at [facility],  [address] [place], [place] [address]     Scheduled Meds:  diltiazem   120 mg Oral Daily   enoxaparin  (LOVENOX ) injection  40 mg Subcutaneous Q24H   famotidine   20 mg Oral QHS   ondansetron  (ZOFRAN ) IV  4 mg Intravenous Q6H   pantoprazole   40 mg Oral QPM   rosuvastatin   5 mg Oral Daily   sodium chloride   1 g Oral Q breakfast   Continuous Infusions:   LOS: 3 days   Time spent: 60 minutes  Camellia Door, DO  [facility]  07/31/2024, 10:48 AM

## 2024-07-31 NOTE — TOC Initial Note (Addendum)
 Transition of Care (TOC) - Initial/Assessment Note   Spoke to patient and husband Chuck at bedside.   Discussed PT/OT recommendations, for HHPT/OT, tub seat and rolling walker.   Patient has a tub seat at home already. Not sure where her walker is asking for walker. Insurance covers walker every 5 years.    Patient and husband have family coming in town Monday and do not want to start HHPT/OT until after that. Explained HH agency will call them directly to arrange visits.    Patient has no preference. Walker ordered with Jermaine with [employer].   Left Burnard with [facility] a message regarding HH. [place]  with [facility] accepted referral for HHPT/OT  Patient Details  Name: [name] MRN: [id number] Date of Birth: [birth year]  Transition of Care [facility]) CM/SW Contact:    Stephane Powell Jansky, RN Phone Number: 07/31/2024, 10:23 AM  Clinical Narrative:                   Expected Discharge Plan: Home w Home Health Services Barriers to Discharge: Continued Medical Work up   Patient Goals and CMS Choice Patient states their goals for this hospitalization and ongoing recovery are:: to return to home CMS Medicare.gov Compare Post Acute Care list provided to:: Patient Choice offered to / list presented to : Patient, Spouse      Expected Discharge Plan and Services   Discharge Planning Services: CM Consult Post Acute Care Choice: Durable Medical Equipment Living arrangements for the past 2 months: Single Family Home                 DME Arranged: Walker rolling DME Agency: [employer] Date DME Agency Contacted: 07/31/24 Time DME Agency Contacted: 1022 Representative spoke with at DME Agency: [place] HH Arranged: PT, OT (see note) HH Agency: [facility]        Prior Living Arrangements/Services Living arrangements for the past 2 months: Single Family Home Lives with:: Spouse Patient language and need for interpreter reviewed:: Yes        Need for  Family Participation in Patient Care: Yes (Comment) Care giver support system in place?: Yes (comment) Current home services: DME Criminal Activity/Legal Involvement Pertinent to Current Situation/Hospitalization: No - Comment as needed  Activities of Daily Living   ADL Screening (condition at time of admission) Independently performs ADLs?: Yes (appropriate for developmental age) Is the patient deaf or have difficulty hearing?: No Does the patient have difficulty seeing, even when wearing glasses/contacts?: No Does the patient have difficulty concentrating, remembering, or making decisions?: No  Permission Sought/Granted      Share Information with NAME: Riva husband  Permission granted to share info w AGENCY: [facility], [facility]        Emotional Assessment Appearance:: Appears stated age Attitude/Demeanor/Rapport: Engaged Affect (typically observed): Appropriate Orientation: : Oriented to Self, Oriented to Place, Oriented to  Time, Oriented to Situation Alcohol  / Substance Use: Not Applicable Psych Involvement: No (comment)  Admission diagnosis:  Hyponatremia [E87.1] Fall, initial encounter [W19.XXXA] Patient Active Problem List   Diagnosis Date Noted   Hyponatremia 07/28/2024   Reflux esophagitis 05/21/2024   Paresthesia 09/09/2022   Tinnitus 04/24/2022   Vitamin D  deficiency 09/10/2021   Essential hypertension 06/11/2018   History of thyroid  disease 05/10/2016   Osteoarthritis of neck 05/10/2016   S/P total knee arthroplasty 12/20/2014   Palpitations 09/23/2013   Fatigue 08/07/2013   Alopecia 05/11/2013   Benign hypertensive heart disease without heart failure 01/07/2013   Pacemaker-Medtronic 10/08/2012  Hiatal hernia 05/28/2012   Esophageal stricture 05/28/2012   Anxiety 01/16/2012   Sick sinus syndrome (HCC)    Osteoarthritis of knee 04/19/2011   Internal hemorrhoids 02/28/2011   Constipation 02/28/2011   GERD 06/28/2009   Hypercholesterolemia 03/30/2008    Obstructive sleep apnea 03/30/2008   History of SCC (squamous cell carcinoma) of skin 03/30/2008   PCP:  Cleatus Arlyss RAMAN, MD Pharmacy:   [facility] [address], [place] - [address] [place] [address] Phone: [phone] Fax: [phone]  [id number] - [place], [place] - [address] [place] [address] Phone: [phone] Fax: [phone]     Social Drivers of Health (SDOH) Social History: SDOH Screenings   Food Insecurity: No Food Insecurity (07/28/2024)  Housing: Low Risk  (07/28/2024)  Transportation Needs: No Transportation Needs (07/28/2024)  Utilities: Not At Risk (07/28/2024)  Alcohol  Screen: Low Risk  (07/15/2020)  Depression (PHQ2-9): Low Risk  (05/21/2024)  Financial Resource Strain: Low Risk  (05/21/2024)  Physical Activity: Inactive (05/21/2024)  Social Connections: Socially Integrated (07/28/2024)  Stress: Stress Concern Present (05/21/2024)  Tobacco Use: Low Risk  (07/28/2024)   SDOH Interventions:     Readmission Risk Interventions     No data to display

## 2024-07-31 NOTE — Care Management Important Message (Signed)
 Important Message  Patient Details  Name: QUANISHA DREWRY MRN: [id number] Date of Birth: 06/01/1946   Important Message Given:  Yes - Medicare IM  Patient let prior to IM delivery will mail acopy to the patient home address.    Analy Bassford 07/31/2024, 12:08 PM

## 2024-07-31 NOTE — Discharge Summary (Signed)
 [facility] Physician Discharge Summary   Patient name: [name]  Admit date:     07/27/2024  Discharge date: 07/31/2024  Attending Physician: FRANKY REDIA SAILOR [[id number]]  Discharge Physician: Camellia Door   PCP: Cleatus Arlyss RAMAN, MD  Admitted From: Home  Disposition:  Home  Recommendations for Outpatient Follow-up:  Follow up with PCP in 1-2 weeks  Home Health:Yes. HH PT/OT Equipment/Devices: rolling walker  Discharge Condition:Stable CODE STATUS:FULL Diet recommendation: Regular Fluid Restriction: None  Hospital Summary: CC: memory problems and recent fall HPI: CASEE KNEPP is a 78 y.o. female with history of hypertension, sick sinus syndrome status post pacemaker placement, chronic mild hyponatremia who has been experiencing fatigue for the last year and a half after patient had COVID-19 infection was placed on Cymbalta  for possible depression and had taken 2 doses on July 24, 2024.  The following day patient felt uncomfortable and also noticed that she has been having increased difficulty walking due to balance issues resulting in stumbling and falls.  Called her PCP and was instructed to come to the ER.  Patient states she has been eating less than usual and has been drinking water not more than usual.  Patient previously had hyponatremia from hydrochlorothiazide  which was discontinued many months ago.  Patient's sodium usually runs around 128-132.   ED Course: In the ER labs show sodium of 115.  Chloride 81 TSH 2.3 CT head unremarkable.  Urine osmolality was 306 urine sodium less than 10.  Nephrologist on-call was consulted and felt that patient's symptoms were consistent with severe hyponatremia was given 100 mL of 3% sodium chloride  and admitted for further management.  At the time of my exam patient is ambulating without difficulty.  Significant Events: Admitted 07/27/2024 acute on chronic hyponatremia   Admission Labs: WBC 7.1, HgB 15.1, plt 241 ETOH <15 Na  115, K 3.6, CO2 of 20, BUN 16, Scr 0.98, glu 116 T prot 6.8, alb 3.9, AST 22, ALT 19, alk phos 54, T. Bili 0.9 UDS Positive for Benzo  Admission Imaging Studies: CT head Generalized cerebral atrophy and microvascular disease changes of the supratentorial brain. 2. No acute intracranial abnormality.  Significant Labs: 07-27-2024 Urine Na <10, serum Na 119, serum osm 249, urine osm 306 07-27-2024 TSH 2.38 07-28-2024 AM cortisol 14.9  Significant Imaging Studies:   Antibiotic Therapy: Anti-infectives (From admission, onward)    None       Procedures:   Consultants: nephrology   Hospital Course by Problem: * Hyponatremia 07-29-2024 admitted on 07-27-2024. Received 3% saline by nephrology. Urine Na very low at <10. Appears her hyponatremia is due to low solute intake. Husband states that pt takes a low salt diet to the extreme. She does not eat a lot at home to begin with due to multiple food intolerance and GERD. Nephrology following. I encouraged her to eat salty snacks.  07-30-2024 Na up to 130 on salt tabs. Nephrology has decreased salt tabs to once a day. Repeat BMP in AM. Hopefully home tomorrow.  07-31-2024 Na remains at 130 x 2 days in a row. Given clearance for DC by nephrology.  Nephrology thinks her hyponatremia is due to low solute intake in combination with Cymbalta  therapy. Will stop cymbalta  at discharge. Continue 1 gram daily of salt tabs. F/u with PCP next week for repeat BMP. If Hyponatremia continues to be an issue, PCP can refer her back to nephrology as outpatient. Reminded pt and husband that pt needs to eat salty snacks each  day. This may be difficult for patient due to her food intolerances and her general lack of appetite.  Reflux esophagitis 07-29-2024 on PPI. Pt wants to restart her pepcid  20 mg bid.  07-30-2024 remains on qday protonix . And bid pepcid .  07-31-2024 stable. Remains on bid pepcid  and daily PPI.  Essential hypertension 07-29-2024 on  cardizem  CD  07-30-2024 stable. On cardizem  cd 120 mg daily.  07-31-2024 stable on cardizem  CD 120 mg daily. Continue at home.  Pacemaker-Medtronic 07-29-2024 stable.  07-30-2024 stable.  07-31-2024 stable.  Obstructive sleep apnea 07-29-2024 continue with CPAP prn  07-30-2024 stable.  07-31-2024 stable.  Hypercholesterolemia 07-29-2024 on crestor  5 mg daily.  07-30-2024 continue crestor .  07-31-2024 stable on crestor . Continue at home.    Discharge Diagnoses:  Principal Problem:   Hyponatremia Active Problems:   Hypercholesterolemia   Obstructive sleep apnea   Pacemaker-Medtronic   Essential hypertension   Reflux esophagitis   Discharge Instructions  Discharge Instructions     Call MD for:  difficulty breathing, headache or visual disturbances   Complete by: As directed    Call MD for:  hives   Complete by: As directed    Call MD for:  persistant dizziness or light-headedness   Complete by: As directed    Call MD for:  persistant nausea and vomiting   Complete by: As directed    Call MD for:  redness, tenderness, or signs of infection (pain, swelling, redness, odor or green/yellow discharge around incision site)   Complete by: As directed    Call MD for:  severe uncontrolled pain   Complete by: As directed    Call MD for:  temperature >100.4   Complete by: As directed    Diet general   Complete by: As directed    Add salty snacks to your diet every day.   Discharge instructions   Complete by: As directed    1. Follow up with your primary care provider in 1-2 weeks following discharge from hospital. You will need bloodwork in the office to check on your sodium levels.   Increase activity slowly   Complete by: As directed       Allergies as of 07/31/2024       Reactions   Hydrocodone    Increased HR   Antihistamines, Loratadine-type Other (See Comments)   Heart races/jittery   Cephalexin Nausea And Vomiting   Citalopram    intolerant    Codeine Other (See Comments)   Increased heart rate, anxiety   Demerol [meperidine] Other (See Comments)   Increased heart rate, anxiety   Epinephrine  Other (See Comments)   Increased heart rate   Erythromycin Nausea And Vomiting   Hydrochlorothiazide     Low sodium   Hyoscyamine  Sulfate Other (See Comments)   Pt could not tolerate this med UNKNOWN reaction   Inderal [propranolol] Other (See Comments)   Extreme tiredness   Lidocaine  Other (See Comments)   Family hx of malignant hyperthermia   Lipitor [atorvastatin] Other (See Comments)   Muscle aches   Losartan Potassium Other (See Comments)   Pt could not tolerate this med UNKNOWN REACTION   Morphine Other (See Comments)   Nightmares, increased BP   Nebivolol Other (See Comments)   angioedema   Paxil [paroxetine Hcl] Other (See Comments)   Intolerant, mood changes.    Penicillins Nausea And Vomiting   Prednisone Other (See Comments)   Tolerates low doses if absolutely necessary - makes her nervous, jittery   Propranolol Hcl Other (  See Comments)   Pt could not tolerate this med UNKNOWN REACTION   Sertraline Hcl Other (See Comments)   Pt could not tolerate this med UNKNOWN REACTION   Tetracycline Nausea And Vomiting   Toprol  Xl [metoprolol  Succinate] Other (See Comments)   Extreme tiredness   Verapamil Other (See Comments)   Pt could not tolerate this med UNKNOWN REACTION   Hydralazine     headaches   Oxycodone -acetaminophen  Palpitations   Causes accelerated heart rate        Medication List     STOP taking these medications    DULoxetine  30 MG capsule Commonly known as: Cymbalta        TAKE these medications    acetaminophen  500 MG tablet Commonly known as: TYLENOL  Take 1,000 mg by mouth every 6 (six) hours as needed (pain).   diltiazem  120 MG 24 hr capsule Commonly known as: CARDIZEM  CD Take 1 capsule (120 mg total) by mouth daily.   diltiazem  30 MG tablet Commonly known as: Cardizem  Take 1  tablet (30 mg total) by mouth daily as needed.   famotidine  20 MG tablet Commonly known as: PEPCID  Take 1 tablet by mouth twice daily   LORazepam  1 MG tablet Commonly known as: ATIVAN  TAKE 1 TABLET BY MOUTH EVERY 6 HOURS AS NEEDED   omeprazole  20 MG capsule Commonly known as: PRILOSEC Take 1 capsule (20 mg total) by mouth daily. What changed: when to take this   ondansetron  4 MG tablet Commonly known as: Zofran  Take 1 tablet (4 mg total) by mouth every 6 (six) hours as needed for nausea or vomiting.   rosuvastatin  10 MG tablet Commonly known as: CRESTOR  Take 0.5 tablets (5 mg total) by mouth daily.   sodium chloride  1 g tablet Take 1 tablet (1 g total) by mouth daily with breakfast. Start taking on: August 01, 2024   SYSTANE OP Place 1 drop into both eyes daily as needed (dry eyes).               Durable Medical Equipment  (From admission, onward)           Start     Ordered   07/31/24 0911  For home use only DME Walker rolling  Once       Question Answer Comment  Walker: With 5 Inch Wheels   Patient needs a walker to treat with the following condition Debility      07/31/24 0911            Follow-up Information     [facility] Follow up.   Specialty: Home Health Services Contact information: [address] [place] [address] [phone]                Allergies  Allergen Reactions   Hydrocodone     Increased HR   Antihistamines, Loratadine-Type Other (See Comments)    Heart races/jittery   Cephalexin Nausea And Vomiting   Citalopram     intolerant   Codeine Other (See Comments)    Increased heart rate, anxiety   Demerol [Meperidine] Other (See Comments)    Increased heart rate, anxiety   Epinephrine  Other (See Comments)    Increased heart rate   Erythromycin Nausea And Vomiting   Hydrochlorothiazide      Low sodium   Hyoscyamine  Sulfate Other (See Comments)    Pt could not tolerate this med UNKNOWN  reaction   Inderal [Propranolol] Other (See Comments)    Extreme tiredness  Lidocaine  Other (See Comments)    Family hx of malignant hyperthermia   Lipitor [Atorvastatin] Other (See Comments)    Muscle aches   Losartan Potassium Other (See Comments)    Pt could not tolerate this med UNKNOWN REACTION   Morphine Other (See Comments)    Nightmares, increased BP   Nebivolol Other (See Comments)    angioedema   Paxil [Paroxetine Hcl] Other (See Comments)    Intolerant, mood changes.    Penicillins Nausea And Vomiting   Prednisone Other (See Comments)    Tolerates low doses if absolutely necessary - makes her nervous, jittery   Propranolol Hcl Other (See Comments)    Pt could not tolerate this med UNKNOWN REACTION   Sertraline Hcl Other (See Comments)    Pt could not tolerate this med UNKNOWN REACTION   Tetracycline Nausea And Vomiting   Toprol  Xl [Metoprolol  Succinate] Other (See Comments)    Extreme tiredness   Verapamil Other (See Comments)    Pt could not tolerate this med UNKNOWN REACTION   Hydralazine      headaches   Oxycodone -Acetaminophen  Palpitations    Causes accelerated heart rate    Discharge Exam: Vitals:   07/31/24 0810 07/31/24 0942  BP: (!) 156/79 (!) 151/75  Pulse: 61   Resp: 19   Temp: 98.4 F (36.9 C)   SpO2: 95%     Physical Exam Vitals and nursing note reviewed.  Constitutional:      General: She is not in acute distress.    Appearance: She is normal weight. She is not toxic-appearing or diaphoretic.  HENT:     Head: Normocephalic and atraumatic.     Nose: Nose normal.  Cardiovascular:     Rate and Rhythm: Normal rate and regular rhythm.  Pulmonary:     Effort: Pulmonary effort is normal.     Breath sounds: Normal breath sounds.  Abdominal:     General: Abdomen is flat. Bowel sounds are normal. There is no distension.     Palpations: Abdomen is soft.  Musculoskeletal:     Right lower leg: No edema.     Left lower leg: No edema.   Skin:    General: Skin is warm and dry.     Capillary Refill: Capillary refill takes less than 2 seconds.  Neurological:     General: No focal deficit present.     Mental Status: She is alert and oriented to person, place, and time.     The results of significant diagnostics from this hospitalization (including imaging, microbiology, ancillary and laboratory) are listed below for reference.    Microbiology: Recent Results (from the past 240 hours)  MRSA Next Gen by PCR, Nasal     Status: None   Collection Time: 07/28/24  5:46 PM   Specimen: Nasal Mucosa; Nasal Swab  Result Value Ref Range Status   MRSA by PCR Next Gen NOT DETECTED NOT DETECTED Final    Comment: (NOTE) The GeneXpert MRSA Assay (FDA approved for NASAL specimens only), is one component of a comprehensive MRSA colonization surveillance program. It is not intended to diagnose MRSA infection nor to guide or monitor treatment for MRSA infections. Test performance is not FDA approved in patients less than 76 years old. Performed at [facility], [address], [place], [place] [address]      Labs: Basic Metabolic Panel: Recent Labs  Lab 07/27/24 1818 07/27/24 2225 07/28/24 0600 07/28/24 1056 07/29/24 0127 07/29/24 0540 07/29/24 1001 07/29/24 1450 07/29/24 1943  07/30/24 0823 07/31/24 0213  NA 115*   < > 125*   < > 127*   < > 123* 124* 124* 130* 130*  K 3.6  --   --   --  3.3*  --   --   --   --   --  4.2  CL 81*  --   --   --  92*  --   --   --   --   --  95*  CO2 20*  --   --   --  24  --   --   --   --   --  24  GLUCOSE 116*  --   --   --  112*  --   --   --   --   --  101*  BUN 16  --   --   --  13  --   --   --   --   --  12  CREATININE 0.98  --  0.84  --  1.01*  --   --   --   --   --  1.01*  CALCIUM  8.8*  --   --   --  8.4*  --   --   --   --   --  8.2*  MG  --   --   --   --  2.0  --   --   --   --   --   --    < > = values in this interval not displayed.   Liver Function Tests: Recent  Labs  Lab 07/27/24 1818 07/29/24 0127  AST 22 16  ALT 19 18  ALKPHOS 54 50  BILITOT 0.9 0.8  PROT 6.8 5.6*  ALBUMIN 3.9 3.3*   CBC: Recent Labs  Lab 07/27/24 1818 07/28/24 0600 07/29/24 0127  WBC 7.1 4.9 6.2  NEUTROABS 5.2  --  3.9  HGB 15.1* 14.3 14.0  HCT 40.7 38.2 38.4  MCV 87.0 87.0 88.3  PLT 241 184 197   Urinalysis    Component Value Date/Time   COLORURINE YELLOW 07/27/2024 2207   APPEARANCEUR CLEAR 07/27/2024 2207   LABSPEC 1.009 07/27/2024 2207   PHURINE 5.0 07/27/2024 2207   GLUCOSEU 50 (A) 07/27/2024 2207   HGBUR SMALL (A) 07/27/2024 2207   BILIRUBINUR NEGATIVE 07/27/2024 2207   BILIRUBINUR neg 05/21/2023 0952   KETONESUR NEGATIVE 07/27/2024 2207   PROTEINUR NEGATIVE 07/27/2024 2207   UROBILINOGEN 0.2 05/21/2023 0952   UROBILINOGEN 0.2 12/07/2014 1616   NITRITE NEGATIVE 07/27/2024 2207   LEUKOCYTESUR NEGATIVE 07/27/2024 2207   Sepsis Labs Recent Labs  Lab 07/27/24 1818 07/28/24 0600 07/29/24 0127  WBC 7.1 4.9 6.2    Hyponatremia:(serum Na, serum Osm, urine Osm, urine Na, cortisol, TSH, FT4, Total Chol, Trig, HDL, VLDL, LDL) Lab Results  Component Value Date/Time   NA 130 (L) 07/31/2024 02:13 AM   LABOSMO 249 (LL) 07/27/2024 10:25 PM   OSMOLALUR 306 07/27/2024 10:07 PM   NAUR <10 07/27/2024 10:07 PM   LABCORT 14.9 07/28/2024 06:00 AM   TSH 2.384 07/27/2024 10:07 PM   Procedures/Studies: CT HEAD WO CONTRAST ( ) Result Date: 07/27/2024 CLINICAL DATA:  Altered mental status. EXAM: CT HEAD WITHOUT CONTRAST TECHNIQUE: Contiguous axial images were obtained from the base of the skull through the vertex without intravenous contrast. RADIATION DOSE REDUCTION: This exam was performed according to the departmental dose-optimization program which includes automated exposure control, adjustment of the mA  and/or kV according to patient size and/or use of iterative reconstruction technique. COMPARISON:  February 20, 2018 FINDINGS: Brain: There is generalized  cerebral atrophy with widening of the extra-axial spaces and ventricular dilatation. There are areas of decreased attenuation within the white matter tracts of the supratentorial brain, consistent with microvascular disease changes. Vascular: Mild bilateral cavernous carotid artery calcification is noted. Skull: Normal. Negative for fracture or focal lesion. Sinuses/Orbits: No acute finding. Other: None. IMPRESSION: 1. Generalized cerebral atrophy and microvascular disease changes of the supratentorial brain. 2. No acute intracranial abnormality. Electronically Signed   By: Suzen Dials M.D.   On: 07/27/2024 19:35    Time coordinating discharge: 60 mins  SIGNED:  Camellia Door, DO [facility] 07/31/24, 10:49 AM

## 2024-08-04 ENCOUNTER — Telehealth: Payer: Self-pay | Admitting: *Deleted

## 2024-08-04 NOTE — Transitions of Care (Post Inpatient/ED Visit) (Signed)
 08/04/2024  Name: [name] MRN: [id number] DOB: Aug 29, 1946  Today's TOC FU Call Status: Today's TOC FU Call Status:: Successful TOC FU Call Completed TOC FU Call Complete Date: 08/04/24 Patient's Name and Date of Birth confirmed.  Transition Care Management Follow-up Telephone Call Date of Discharge: 07/31/24 Discharge Facility: [facility]) Type of Discharge: Inpatient Admission Primary Inpatient Discharge Diagnosis:: hyponatremia How have you been since you were released from the hospital?: Better Any questions or concerns?: No  Items Reviewed: Did you receive and understand the discharge instructions provided?: Yes Medications obtained,verified, and reconciled?: Yes (Medications Reviewed) Any new allergies since your discharge?: No Dietary orders reviewed?: Yes Type of Diet Ordered:: Regular diet. Do you have support at home?: Yes People in Home [RPT]: spouse Name of Support/Comfort Primary Source: Riva  Medications Reviewed Today: Medications Reviewed Today     Reviewed by Kennieth Cathlean DEL, RN (Case Manager) on 08/04/24 at 1026  Med List Status: <None>   Medication Order Taking? Sig Documenting Provider Last Dose Status Informant  acetaminophen  (TYLENOL ) 500 MG tablet [id number] Yes Take 1,000 mg by mouth every 6 (six) hours as needed (pain). [provider]  Active Self, Spouse/Significant Other, Pharmacy Records  diltiazem  (CARDIZEM  CD) 120 MG 24 hr capsule [id number] Yes Take 1 capsule (120 mg total) by mouth daily. Cleatus Arlyss RAMAN, MD  Active Self, Spouse/Significant Other, Pharmacy Records  diltiazem  (CARDIZEM ) 30 MG tablet [id number] Yes Take 1 tablet (30 mg total) by mouth daily as needed. Verlin Lonni BIRCH, MD  Active Self, Spouse/Significant Other, Pharmacy Records           Med Note LEOBARDO, NICOLE   Tue Jul 28, 2024  3:22 AM) Has on hand for accelerated heart rate; has not used   famotidine  (PEPCID ) 20 MG tablet [id number] Yes Take 1 tablet  by mouth twice daily Cleatus Arlyss RAMAN, MD  Active Self, Spouse/Significant Other, Pharmacy Records  LORazepam  (ATIVAN ) 1 MG tablet [id number] Yes TAKE 1 TABLET BY MOUTH EVERY 6 HOURS AS NEEDED Cleatus Arlyss RAMAN, MD  Active Self, Spouse/Significant Other, Pharmacy Records  omeprazole  (PRILOSEC) 20 MG capsule [id number] Yes Take 1 capsule (20 mg total) by mouth daily.  Patient taking differently: Take 20 mg by mouth at bedtime.   Jimmy Charlie FERNS, MD  Active Self, Spouse/Significant Other, Pharmacy Records  ondansetron  (ZOFRAN ) 4 MG tablet [id number] Yes Take 1 tablet (4 mg total) by mouth every 6 (six) hours as needed for nausea or vomiting. Laurence Locus, DO  Active   Polyethyl Glycol-Propyl Glycol (SYSTANE OP) [id number] Yes Place 1 drop into both eyes daily as needed (dry eyes).  [provider]  Active Self, Spouse/Significant Other, Pharmacy Records  rosuvastatin  (CRESTOR ) 10 MG tablet [id number] Yes Take 0.5 tablets (5 mg total) by mouth daily. Cleatus Arlyss RAMAN, MD  Active Self, Pharmacy Records, Spouse/Significant Other  sodium chloride  1 g tablet [id number] Yes Take 1 tablet (1 g total) by mouth daily with breakfast. Laurence Locus, DO  Active             Home Care and Equipment/Supplies: Were Home Health Services Ordered?: Yes Name of Home Health Agency:: [facility] Has Agency set up a time to come to your home?: Yes First Home Health Visit Date: 08/04/24 (They called and said they would call back today.) Any new equipment or medical supplies ordered?: Yes Name of Medical supply agency?: [employer] Were you able to get the equipment/medical supplies?: Yes Do you have any questions related to the  use of the equipment/supplies?: No  Functional Questionnaire: Do you need assistance with bathing/showering or dressing?: Yes Do you need assistance with meal preparation?: Yes Do you need assistance with eating?: No Do you need assistance with getting out of bed/getting out of a chair/moving?:  No Do you have difficulty managing or taking your medications?: Yes  Follow up appointments reviewed: PCP Follow-up appointment confirmed?: No MD Provider Line Number:[phone] Given: Yes (Per husband they have a good rapport with the Dr and will call for appointment) Specialist Hospital Follow-up appointment confirmed?: NA Do you need transportation to your follow-up appointment?: No Do you understand care options if your condition(s) worsen?: Yes-patient verbalized understanding  SDOH Interventions Today    Flowsheet Row Most Recent Value  SDOH Interventions   Food Insecurity Interventions Intervention Not Indicated  Housing Interventions Intervention Not Indicated  Transportation Interventions Intervention Not Indicated, Patient Resources (Friends/Family)  Utilities Interventions Intervention Not Indicated   RN discussed the importance of follow up appointment and getting the Na labs checked. Patient and caregiver voiced understanding RN discussed Regular diet and ok to season foods. (Patient sodium was 115 in hospital. RN discussed Transition of care services.   Cathlean Headland BSN RN [facility] Health Care Management Coordinator [email or web address] Direct Dial: [phone]  Fax: [phone] Website: [email or web address]

## 2024-08-06 ENCOUNTER — Ambulatory Visit (INDEPENDENT_AMBULATORY_CARE_PROVIDER_SITE_OTHER)

## 2024-08-06 DIAGNOSIS — I495 Sick sinus syndrome: Secondary | ICD-10-CM

## 2024-08-07 ENCOUNTER — Encounter: Payer: Self-pay | Admitting: Family Medicine

## 2024-08-07 ENCOUNTER — Telehealth: Payer: Self-pay | Admitting: Family Medicine

## 2024-08-07 ENCOUNTER — Ambulatory Visit (INDEPENDENT_AMBULATORY_CARE_PROVIDER_SITE_OTHER): Admitting: Family Medicine

## 2024-08-07 VITALS — BP 138/72 | HR 64 | Temp 99.1°F | Ht 63.0 in | Wt 140.4 lb

## 2024-08-07 DIAGNOSIS — E871 Hypo-osmolality and hyponatremia: Secondary | ICD-10-CM

## 2024-08-07 LAB — COMPREHENSIVE METABOLIC PANEL WITH GFR
AG Ratio: 1.7 (calc) (ref 1.0–2.5)
ALT: 19 U/L (ref 6–29)
AST: 17 U/L (ref 10–35)
Albumin: 4.2 g/dL (ref 3.6–5.1)
Alkaline phosphatase (APISO): 61 U/L (ref 37–153)
BUN: 16 mg/dL (ref 7–25)
CO2: 26 mmol/L (ref 20–32)
Calcium: 8.8 mg/dL (ref 8.6–10.4)
Chloride: 95 mmol/L — ABNORMAL LOW (ref 98–110)
Creat: 0.88 mg/dL (ref 0.60–1.00)
Globulin: 2.5 g/dL (ref 1.9–3.7)
Glucose, Bld: 85 mg/dL (ref 65–99)
Potassium: 4.5 mmol/L (ref 3.5–5.3)
Sodium: 129 mmol/L — ABNORMAL LOW (ref 135–146)
Total Bilirubin: 0.4 mg/dL (ref 0.2–1.2)
Total Protein: 6.7 g/dL (ref 6.1–8.1)
eGFR: 68 mL/min/1.73m2 (ref >=60)

## 2024-08-07 NOTE — Telephone Encounter (Signed)
 Copied from CRM [id number]. Topic: General - Other >> Aug 07, 2024  2:28 PM Chasity T wrote: Reason for CRM: Olam from [facility] home [facility] is calling to inform Dr Cleatus that it was a delay in starting her care and will be admitted on Monday.  If needed for a callback: [phone]

## 2024-08-07 NOTE — Telephone Encounter (Signed)
 FYI

## 2024-08-07 NOTE — Patient Instructions (Addendum)
We'll update you about your labs.  Don't change your meds for now.  Take care.  Glad to see you.

## 2024-08-07 NOTE — Progress Notes (Signed)
 Inpatient course discussed with patient.  She had a history of relatively mild hyponatremia.  Per patient's report and husband's report, she took one dose of cymbalta .  No immediate issues noted but then later on had balance changes.  Then to ER with low sodium.    In discussion with patient today, along with husband, she had been on the significantly low salt diet.  She feels better than her status at admission but still fatigued.  She is not taking Cymbalta .  She is taking salt tablets in the meantime.  D/w pt about continuing salt tabs and adding salt to foods.  Discussed options for salt and protein intake.  Recheck labs pending.  She is still dealing with fatigue at baseline.    Meds, vitals, and allergies reviewed.   ROS: Per HPI unless specifically indicated in ROS section   GEN: nad, alert and oriented HEENT: mucous membranes moist NECK: supple w/o LA CV: rrr PULM: ctab, no inc wob ABD: soft, +bs EXT: no edema SKIN: Well-perfused.  31 minutes were devoted to patient care in this encounter (this includes time spent reviewing the patient's file/history, interviewing and examining the patient, counseling/reviewing plan with patient).

## 2024-08-09 ENCOUNTER — Ambulatory Visit: Payer: Self-pay | Admitting: Family Medicine

## 2024-08-09 DIAGNOSIS — E871 Hypo-osmolality and hyponatremia: Secondary | ICD-10-CM

## 2024-08-09 NOTE — Telephone Encounter (Signed)
 Noted. Thanks.

## 2024-08-09 NOTE — Assessment & Plan Note (Signed)
 She had a profoundly low sodium level that likely contributed to her presenting symptoms.  She was appropriately treated in the meantime.  She only had 1 dose of Cymbalta  per her report and it would be atypical to see this much of an electrolyte derangement with 1 dose of Cymbalta  but either way I added that medication to her contraindication list.  Discussed.  I think it makes sense to continue her salt tablets, add salt to her food, and recheck her labs today.  If she has persistent hyponatremia then I think it would make sense for her to see the renal clinic.  There is the added issue of her baseline fatigue but we need to address hyponatremia first.

## 2024-08-10 ENCOUNTER — Telehealth: Payer: Self-pay

## 2024-08-10 DIAGNOSIS — H269 Unspecified cataract: Secondary | ICD-10-CM | POA: Diagnosis not present

## 2024-08-10 DIAGNOSIS — H15001 Unspecified scleritis, right eye: Secondary | ICD-10-CM | POA: Diagnosis not present

## 2024-08-10 DIAGNOSIS — Z9089 Acquired absence of other organs: Secondary | ICD-10-CM | POA: Diagnosis not present

## 2024-08-10 DIAGNOSIS — Z604 Social exclusion and rejection: Secondary | ICD-10-CM | POA: Diagnosis not present

## 2024-08-10 DIAGNOSIS — E079 Disorder of thyroid, unspecified: Secondary | ICD-10-CM | POA: Diagnosis not present

## 2024-08-10 DIAGNOSIS — M810 Age-related osteoporosis without current pathological fracture: Secondary | ICD-10-CM | POA: Diagnosis not present

## 2024-08-10 DIAGNOSIS — G4733 Obstructive sleep apnea (adult) (pediatric): Secondary | ICD-10-CM | POA: Diagnosis not present

## 2024-08-10 DIAGNOSIS — H9319 Tinnitus, unspecified ear: Secondary | ICD-10-CM | POA: Diagnosis not present

## 2024-08-10 DIAGNOSIS — Z556 Problems related to health literacy: Secondary | ICD-10-CM | POA: Diagnosis not present

## 2024-08-10 DIAGNOSIS — E559 Vitamin D deficiency, unspecified: Secondary | ICD-10-CM | POA: Diagnosis not present

## 2024-08-10 DIAGNOSIS — K2100 Gastro-esophageal reflux disease with esophagitis, without bleeding: Secondary | ICD-10-CM | POA: Diagnosis not present

## 2024-08-10 DIAGNOSIS — Z950 Presence of cardiac pacemaker: Secondary | ICD-10-CM | POA: Diagnosis not present

## 2024-08-10 DIAGNOSIS — M47812 Spondylosis without myelopathy or radiculopathy, cervical region: Secondary | ICD-10-CM | POA: Diagnosis not present

## 2024-08-10 DIAGNOSIS — E7800 Pure hypercholesterolemia, unspecified: Secondary | ICD-10-CM | POA: Diagnosis not present

## 2024-08-10 DIAGNOSIS — K222 Esophageal obstruction: Secondary | ICD-10-CM | POA: Diagnosis not present

## 2024-08-10 DIAGNOSIS — Z860101 Personal history of adenomatous and serrated colon polyps: Secondary | ICD-10-CM | POA: Diagnosis not present

## 2024-08-10 DIAGNOSIS — E871 Hypo-osmolality and hyponatremia: Secondary | ICD-10-CM | POA: Diagnosis not present

## 2024-08-10 DIAGNOSIS — I495 Sick sinus syndrome: Secondary | ICD-10-CM | POA: Diagnosis not present

## 2024-08-10 DIAGNOSIS — I119 Hypertensive heart disease without heart failure: Secondary | ICD-10-CM | POA: Diagnosis not present

## 2024-08-10 LAB — CUP PACEART REMOTE DEVICE CHECK
Battery Impedance: 1123 Ohm
Battery Remaining Longevity: 65 mo
Battery Voltage: 2.77 V
Brady Statistic AP VP Percent: 0 %
Brady Statistic AP VS Percent: 43 %
Brady Statistic AS VP Percent: 0 %
Brady Statistic AS VS Percent: 57 %
Date Time Interrogation Session: 20250904144417
Implantable Lead Connection Status: 753985
Implantable Lead Connection Status: 753985
Implantable Lead Implant Date: 20060620
Implantable Lead Implant Date: 20060620
Implantable Lead Location: 753859
Implantable Lead Location: 753860
Implantable Lead Model: 5076
Implantable Lead Model: 5076
Implantable Pulse Generator Implant Date: 20160219
Lead Channel Impedance Value: 709 Ohm
Lead Channel Impedance Value: 871 Ohm
Lead Channel Pacing Threshold Amplitude: 0.5 V
Lead Channel Pacing Threshold Amplitude: 0.75 V
Lead Channel Pacing Threshold Pulse Width: 0.4 ms
Lead Channel Pacing Threshold Pulse Width: 0.4 ms
Lead Channel Setting Pacing Amplitude: 2 V
Lead Channel Setting Pacing Amplitude: 2.5 V
Lead Channel Setting Pacing Pulse Width: 0.4 ms
Lead Channel Setting Sensing Sensitivity: 5.6 mV
Zone Setting Status: 755011
Zone Setting Status: 755011

## 2024-08-10 NOTE — Telephone Encounter (Signed)
 Copied from CRM [id number]. Topic: Clinical - Home Health Verbal Orders >> Aug 10, 2024  1:46 PM Burnard DEL wrote: Caller/Agency: [facility] Callback Number: [phone] Service Requested: Physical Therapy Frequency: 1wk1  2wk2 1wk 5 Any new concerns about the patient? No

## 2024-08-11 NOTE — Telephone Encounter (Signed)
 Reached out to [facility] was unable to leave a message due to mailbox being fully. If she calls back please give the verbal orders

## 2024-08-11 NOTE — Telephone Encounter (Signed)
 Please give the order.  Thanks.

## 2024-08-12 NOTE — Telephone Encounter (Signed)
 Please check with referrals and make sure that the referral for the kidney clinic is going to [facility], ie the doctors who saw her in the hospital.  Thanks.

## 2024-08-13 ENCOUNTER — Inpatient Hospital Stay: Admitting: Family Medicine

## 2024-08-13 NOTE — Telephone Encounter (Signed)
 Please check on referral. Patient would like to use [facility]. Thank you

## 2024-08-13 NOTE — Telephone Encounter (Signed)
 Spoke with [facility] and provided verbal orders

## 2024-08-18 DIAGNOSIS — K222 Esophageal obstruction: Secondary | ICD-10-CM | POA: Diagnosis not present

## 2024-08-18 DIAGNOSIS — H269 Unspecified cataract: Secondary | ICD-10-CM | POA: Diagnosis not present

## 2024-08-18 DIAGNOSIS — Z950 Presence of cardiac pacemaker: Secondary | ICD-10-CM | POA: Diagnosis not present

## 2024-08-18 DIAGNOSIS — E559 Vitamin D deficiency, unspecified: Secondary | ICD-10-CM | POA: Diagnosis not present

## 2024-08-18 DIAGNOSIS — Z556 Problems related to health literacy: Secondary | ICD-10-CM | POA: Diagnosis not present

## 2024-08-18 DIAGNOSIS — M47812 Spondylosis without myelopathy or radiculopathy, cervical region: Secondary | ICD-10-CM | POA: Diagnosis not present

## 2024-08-18 DIAGNOSIS — E871 Hypo-osmolality and hyponatremia: Secondary | ICD-10-CM | POA: Diagnosis not present

## 2024-08-18 DIAGNOSIS — M810 Age-related osteoporosis without current pathological fracture: Secondary | ICD-10-CM | POA: Diagnosis not present

## 2024-08-18 DIAGNOSIS — Z9089 Acquired absence of other organs: Secondary | ICD-10-CM | POA: Diagnosis not present

## 2024-08-18 DIAGNOSIS — K2100 Gastro-esophageal reflux disease with esophagitis, without bleeding: Secondary | ICD-10-CM | POA: Diagnosis not present

## 2024-08-18 DIAGNOSIS — Z604 Social exclusion and rejection: Secondary | ICD-10-CM | POA: Diagnosis not present

## 2024-08-18 DIAGNOSIS — H9319 Tinnitus, unspecified ear: Secondary | ICD-10-CM | POA: Diagnosis not present

## 2024-08-18 DIAGNOSIS — E079 Disorder of thyroid, unspecified: Secondary | ICD-10-CM | POA: Diagnosis not present

## 2024-08-18 DIAGNOSIS — I495 Sick sinus syndrome: Secondary | ICD-10-CM | POA: Diagnosis not present

## 2024-08-18 DIAGNOSIS — I119 Hypertensive heart disease without heart failure: Secondary | ICD-10-CM | POA: Diagnosis not present

## 2024-08-18 DIAGNOSIS — H15001 Unspecified scleritis, right eye: Secondary | ICD-10-CM | POA: Diagnosis not present

## 2024-08-18 DIAGNOSIS — Z860101 Personal history of adenomatous and serrated colon polyps: Secondary | ICD-10-CM | POA: Diagnosis not present

## 2024-08-18 DIAGNOSIS — G4733 Obstructive sleep apnea (adult) (pediatric): Secondary | ICD-10-CM | POA: Diagnosis not present

## 2024-08-19 ENCOUNTER — Ambulatory Visit: Payer: Self-pay | Admitting: Cardiovascular Disease

## 2024-08-19 ENCOUNTER — Other Ambulatory Visit: Payer: Self-pay | Admitting: Family Medicine

## 2024-08-20 ENCOUNTER — Telehealth: Payer: Self-pay | Admitting: Cardiovascular Disease

## 2024-08-20 ENCOUNTER — Other Ambulatory Visit: Payer: Self-pay | Admitting: Cardiovascular Disease

## 2024-08-20 DIAGNOSIS — I495 Sick sinus syndrome: Secondary | ICD-10-CM | POA: Diagnosis not present

## 2024-08-20 DIAGNOSIS — Z556 Problems related to health literacy: Secondary | ICD-10-CM | POA: Diagnosis not present

## 2024-08-20 DIAGNOSIS — M47812 Spondylosis without myelopathy or radiculopathy, cervical region: Secondary | ICD-10-CM | POA: Diagnosis not present

## 2024-08-20 DIAGNOSIS — H9319 Tinnitus, unspecified ear: Secondary | ICD-10-CM | POA: Diagnosis not present

## 2024-08-20 DIAGNOSIS — Z860101 Personal history of adenomatous and serrated colon polyps: Secondary | ICD-10-CM | POA: Diagnosis not present

## 2024-08-20 DIAGNOSIS — E559 Vitamin D deficiency, unspecified: Secondary | ICD-10-CM | POA: Diagnosis not present

## 2024-08-20 DIAGNOSIS — I119 Hypertensive heart disease without heart failure: Secondary | ICD-10-CM

## 2024-08-20 DIAGNOSIS — H15001 Unspecified scleritis, right eye: Secondary | ICD-10-CM | POA: Diagnosis not present

## 2024-08-20 DIAGNOSIS — Z604 Social exclusion and rejection: Secondary | ICD-10-CM | POA: Diagnosis not present

## 2024-08-20 DIAGNOSIS — E871 Hypo-osmolality and hyponatremia: Secondary | ICD-10-CM | POA: Diagnosis not present

## 2024-08-20 DIAGNOSIS — Z950 Presence of cardiac pacemaker: Secondary | ICD-10-CM | POA: Diagnosis not present

## 2024-08-20 DIAGNOSIS — K222 Esophageal obstruction: Secondary | ICD-10-CM | POA: Diagnosis not present

## 2024-08-20 DIAGNOSIS — E7800 Pure hypercholesterolemia, unspecified: Secondary | ICD-10-CM | POA: Diagnosis not present

## 2024-08-20 DIAGNOSIS — Z9089 Acquired absence of other organs: Secondary | ICD-10-CM | POA: Diagnosis not present

## 2024-08-20 NOTE — Telephone Encounter (Signed)
 Pt c/o BP issue: STAT if pt c/o blurred vision, one-sided weakness or slurred speech.  STAT if BP is GREATER than 180/120 TODAY.  STAT if BP is LESS than 90/60 and SYMPTOMATIC TODAY  1. What is your BP concern? Michael with [facility] with HH  2. Have you taken any BP medication today? Yes   3. What are your last 5 BP readings? He states pt diastolic is ranging between 96-100 and systolic is normal in 140's   4. Are you having any other symptoms (ex. Dizziness, headache, blurred vision, passed out)? Fatigue

## 2024-08-20 NOTE — Telephone Encounter (Signed)
 Spoke with Ozell at [facility] who states that he went to see the patient today and her diastolic BP was in the upper 90s-100. He states that the patient was reporting some fatigue. The patient's husband is her caregiver who reported that the patient goes through cycles of feeling worse for several days with no energy and then will return back to her baseline. Ozell states that the patient spouse admits that the patient is not eating much. He states that while he was there the patient did have a supplement shake. He states that before her left her diastolic had come down to 90.   I called the patient to check on her. Patient does report some fatigue along with stress due to recent hospitalization and trying to determine what is going on with her sodium levels. She is supposed to be seeing a nephrologist but they have not contacted her to schedule yet. Patient denies any headaches, chest pain, shortness of breath, lightheadedness or dizziness. Patient reports that she is taking her medications as prescribed. She has also been taking a salt tablet. Advised patient to keep a log of her blood pressures at home and give us  a call if pressures remain elevated.

## 2024-08-21 NOTE — Telephone Encounter (Signed)
 Name of Medication: LORazepam  (ATIVAN ) 1 MG tablet  Name of Pharmacy: [facility]  Last [place] or Written Date and Quantity: 05/10/24 #120 2 Rf   Last Office Visit and Type: o/v 08/07/24  Next Office Visit and Type: n/a  Last Controlled Substance Agreement Date: n/a Last UDS:n/a

## 2024-08-21 NOTE — Telephone Encounter (Unsigned)
 Copied from CRM [id number]. Topic: Clinical - Medication Question >> Aug 21, 2024 11:36 AM Pinkey ORN wrote: Reason for CRM: LORazepam  (ATIVAN ) 1 MG tablet >> Aug 21, 2024 11:37 AM Pinkey ORN wrote: Patient states the pharmacy sent over a refill request for LORazepam  (ATIVAN ) 1 MG tablet and hasn't received an update as of yet. Please follow up with patient.

## 2024-08-21 NOTE — Telephone Encounter (Signed)
 Sent. Thanks.

## 2024-08-24 ENCOUNTER — Ambulatory Visit: Payer: Self-pay

## 2024-08-24 NOTE — Telephone Encounter (Signed)
 Noted, will see tomorrow at OV.  Thanks

## 2024-08-24 NOTE — Telephone Encounter (Signed)
 FYI Only or Action Required?: FYI only for provider.  Patient was last seen in primary care on 08/07/2024 by Cleatus Arlyss RAMAN, MD.  Called Nurse Triage reporting Hip Pain and fatigue (worried sodium low).  Symptoms began yesterday.  Interventions attempted: Nothing.  Symptoms are: unchanged.  Triage Disposition: See PCP When Office is Open (Within 3 Days)  Patient/caregiver understands and will follow disposition?: yes         Copied from CRM #[id number]. Topic: Clinical - Red Word Triage >> Aug 24, 2024  8:49 AM Emylou G wrote: [facility] that prompted transfer to Nurse Triage: Symptons: need to have sodium checked..  causing fatigue and right hip hurting Reason for Disposition  [1] MODERATE pain (e.g., interferes with normal activities, limping) AND [2] present > 3 days  Answer Assessment - Initial Assessment Questions 1. LOCATION and RADIATION: Where is the pain located? Does the pain spread (shoot) anywhere else?    Right hip and upper thigh  2. QUALITY: What does the pain feel like?  (e.g., sharp, dull, aching, burning)     *No Answer* 3. SEVERITY: How bad is the pain? What does it keep you from doing?   (Scale 1-10; or mild, moderate, severe)     *No Answer* 4. ONSET: When did the pain start? Does it come and go, or is it there all the time?     *No Answer* 5. WORK OR EXERCISE: Has there been any recent work or exercise that involved this part of the body?      *No Answer* 6. CAUSE: What do you think is causing the hip pain?      *No Answer* 7. AGGRAVATING FACTORS: What makes the hip pain worse? (e.g., walking, climbing stairs, running)     *No Answer* 8. OTHER SYMPTOMS: Do you have any other symptoms? (e.g., back pain, pain shooting down leg,  fever, rash)     3/24 fatigue (Na low) In spent normalized - hasn't heard anything from the renal clini  Protocols used: Hip Pain-A-AH

## 2024-08-24 NOTE — Telephone Encounter (Signed)
 Rx sent 08/21/24, #120/2 refills to [facility] at 1:47 PM.   Spoke with pt/pt's husband, Riva (on dpr), notifying them of above info. States they are aware and already picked up med. Nothing else needed at this time.

## 2024-08-25 ENCOUNTER — Ambulatory Visit (INDEPENDENT_AMBULATORY_CARE_PROVIDER_SITE_OTHER): Admitting: Family Medicine

## 2024-08-25 ENCOUNTER — Encounter: Payer: Self-pay | Admitting: Family Medicine

## 2024-08-25 VITALS — BP 136/82 | HR 76 | Temp 98.7°F | Ht 63.0 in | Wt 141.0 lb

## 2024-08-25 DIAGNOSIS — E079 Disorder of thyroid, unspecified: Secondary | ICD-10-CM | POA: Diagnosis not present

## 2024-08-25 DIAGNOSIS — Z860101 Personal history of adenomatous and serrated colon polyps: Secondary | ICD-10-CM | POA: Diagnosis not present

## 2024-08-25 DIAGNOSIS — H15001 Unspecified scleritis, right eye: Secondary | ICD-10-CM | POA: Diagnosis not present

## 2024-08-25 DIAGNOSIS — E7800 Pure hypercholesterolemia, unspecified: Secondary | ICD-10-CM | POA: Diagnosis not present

## 2024-08-25 DIAGNOSIS — G4733 Obstructive sleep apnea (adult) (pediatric): Secondary | ICD-10-CM | POA: Diagnosis not present

## 2024-08-25 DIAGNOSIS — E871 Hypo-osmolality and hyponatremia: Secondary | ICD-10-CM | POA: Diagnosis not present

## 2024-08-25 DIAGNOSIS — H9319 Tinnitus, unspecified ear: Secondary | ICD-10-CM | POA: Diagnosis not present

## 2024-08-25 DIAGNOSIS — M47812 Spondylosis without myelopathy or radiculopathy, cervical region: Secondary | ICD-10-CM | POA: Diagnosis not present

## 2024-08-25 DIAGNOSIS — Z556 Problems related to health literacy: Secondary | ICD-10-CM | POA: Diagnosis not present

## 2024-08-25 DIAGNOSIS — K2100 Gastro-esophageal reflux disease with esophagitis, without bleeding: Secondary | ICD-10-CM | POA: Diagnosis not present

## 2024-08-25 DIAGNOSIS — Z604 Social exclusion and rejection: Secondary | ICD-10-CM | POA: Diagnosis not present

## 2024-08-25 DIAGNOSIS — M810 Age-related osteoporosis without current pathological fracture: Secondary | ICD-10-CM | POA: Diagnosis not present

## 2024-08-25 DIAGNOSIS — E559 Vitamin D deficiency, unspecified: Secondary | ICD-10-CM | POA: Diagnosis not present

## 2024-08-25 DIAGNOSIS — I119 Hypertensive heart disease without heart failure: Secondary | ICD-10-CM | POA: Diagnosis not present

## 2024-08-25 DIAGNOSIS — I495 Sick sinus syndrome: Secondary | ICD-10-CM | POA: Diagnosis not present

## 2024-08-25 DIAGNOSIS — M7060 Trochanteric bursitis, unspecified hip: Secondary | ICD-10-CM | POA: Diagnosis not present

## 2024-08-25 DIAGNOSIS — K222 Esophageal obstruction: Secondary | ICD-10-CM | POA: Diagnosis not present

## 2024-08-25 DIAGNOSIS — Z950 Presence of cardiac pacemaker: Secondary | ICD-10-CM | POA: Diagnosis not present

## 2024-08-25 DIAGNOSIS — Z9089 Acquired absence of other organs: Secondary | ICD-10-CM | POA: Diagnosis not present

## 2024-08-25 DIAGNOSIS — H269 Unspecified cataract: Secondary | ICD-10-CM | POA: Diagnosis not present

## 2024-08-25 LAB — BASIC METABOLIC PANEL WITH GFR
BUN: 17 mg/dL (ref 6–23)
CO2: 29 meq/L (ref 19–32)
Calcium: 9.2 mg/dL (ref 8.4–10.5)
Chloride: 92 meq/L — ABNORMAL LOW (ref 96–112)
Creatinine, Ser: 0.92 mg/dL (ref 0.40–1.20)
GFR: 60.38 mL/min (ref >60.00)
Glucose, Bld: 84 mg/dL (ref 70–99)
Potassium: 4.1 meq/L (ref 3.5–5.1)
Sodium: 129 meq/L — ABNORMAL LOW (ref 135–145)

## 2024-08-25 NOTE — Progress Notes (Unsigned)
 D/w pt about recheck sodium.  She hasn't gotten an appointment at renal yet.  D/w pt about adding salt to food.  She is trying to take salt tabs, ie dealing with the taste of the pills.  D/w pt about options.  She occ pushes up from her legs, ie not using the side of the chair, d/w pt.    R hip pain.  Started a few nights ago.  Anterior and lateral pain.  No trauma.  Some occ L sided pain.  Had to use walker recently.    Meds, vitals, and allergies reviewed.   ROS: Per HPI unless specifically indicated in ROS section

## 2024-08-25 NOTE — Patient Instructions (Addendum)
 Go to the lab on the way out.   If you have mychart we'll likely use that to update you.    Take care.  Glad to see you. I would try icing your hip for about 5 minutes at a time.   Tell PT that you may have trochanteric bursitis.  They should be able to account for and help with that.

## 2024-08-26 ENCOUNTER — Ambulatory Visit: Payer: Self-pay | Admitting: Family Medicine

## 2024-08-26 ENCOUNTER — Telehealth: Payer: Self-pay | Admitting: Family Medicine

## 2024-08-26 DIAGNOSIS — M7060 Trochanteric bursitis, unspecified hip: Secondary | ICD-10-CM | POA: Insufficient documentation

## 2024-08-26 MED ORDER — SODIUM CHLORIDE 1 G PO TABS: 1.0000 g | ORAL_TABLET | Freq: Two times a day (BID) | ORAL | Status: DC

## 2024-08-26 NOTE — Telephone Encounter (Signed)
 Please check with referrals about renal clinic appointment.  Thanks.

## 2024-08-26 NOTE — Assessment & Plan Note (Signed)
 History of.  See notes on labs.  See following phone note about renal clinic appointment.

## 2024-08-26 NOTE — Assessment & Plan Note (Signed)
 Likely, discussed with patient about PT.  See after visit summary.  Ice as needed in the meantime with routine cautions and update me as needed.  I do not suspect intra-articular pathology.

## 2024-08-27 DIAGNOSIS — I495 Sick sinus syndrome: Secondary | ICD-10-CM | POA: Diagnosis not present

## 2024-08-27 DIAGNOSIS — E7800 Pure hypercholesterolemia, unspecified: Secondary | ICD-10-CM | POA: Diagnosis not present

## 2024-08-27 DIAGNOSIS — I119 Hypertensive heart disease without heart failure: Secondary | ICD-10-CM | POA: Diagnosis not present

## 2024-08-27 DIAGNOSIS — Z9089 Acquired absence of other organs: Secondary | ICD-10-CM | POA: Diagnosis not present

## 2024-08-27 DIAGNOSIS — M810 Age-related osteoporosis without current pathological fracture: Secondary | ICD-10-CM | POA: Diagnosis not present

## 2024-08-27 DIAGNOSIS — E079 Disorder of thyroid, unspecified: Secondary | ICD-10-CM | POA: Diagnosis not present

## 2024-08-27 DIAGNOSIS — K222 Esophageal obstruction: Secondary | ICD-10-CM | POA: Diagnosis not present

## 2024-08-27 DIAGNOSIS — Z604 Social exclusion and rejection: Secondary | ICD-10-CM | POA: Diagnosis not present

## 2024-08-27 DIAGNOSIS — Z950 Presence of cardiac pacemaker: Secondary | ICD-10-CM | POA: Diagnosis not present

## 2024-08-27 DIAGNOSIS — Z556 Problems related to health literacy: Secondary | ICD-10-CM | POA: Diagnosis not present

## 2024-08-27 DIAGNOSIS — Z860101 Personal history of adenomatous and serrated colon polyps: Secondary | ICD-10-CM | POA: Diagnosis not present

## 2024-08-27 DIAGNOSIS — K2100 Gastro-esophageal reflux disease with esophagitis, without bleeding: Secondary | ICD-10-CM | POA: Diagnosis not present

## 2024-08-27 DIAGNOSIS — H15001 Unspecified scleritis, right eye: Secondary | ICD-10-CM | POA: Diagnosis not present

## 2024-08-27 DIAGNOSIS — E871 Hypo-osmolality and hyponatremia: Secondary | ICD-10-CM | POA: Diagnosis not present

## 2024-08-27 DIAGNOSIS — G4733 Obstructive sleep apnea (adult) (pediatric): Secondary | ICD-10-CM | POA: Diagnosis not present

## 2024-08-27 DIAGNOSIS — M47812 Spondylosis without myelopathy or radiculopathy, cervical region: Secondary | ICD-10-CM | POA: Diagnosis not present

## 2024-08-27 DIAGNOSIS — H269 Unspecified cataract: Secondary | ICD-10-CM | POA: Diagnosis not present

## 2024-08-27 DIAGNOSIS — E559 Vitamin D deficiency, unspecified: Secondary | ICD-10-CM | POA: Diagnosis not present

## 2024-08-27 DIAGNOSIS — H9319 Tinnitus, unspecified ear: Secondary | ICD-10-CM | POA: Diagnosis not present

## 2024-08-27 NOTE — Telephone Encounter (Signed)
Please check on status of referral

## 2024-08-28 ENCOUNTER — Other Ambulatory Visit (HOSPITAL_COMMUNITY): Payer: Self-pay

## 2024-09-04 ENCOUNTER — Ambulatory Visit: Payer: Self-pay

## 2024-09-04 ENCOUNTER — Encounter: Payer: Self-pay | Admitting: *Deleted

## 2024-09-04 NOTE — Telephone Encounter (Signed)
 FYI Only or Action Required?: FYI only for provider.  Patient was last seen in primary care on 08/25/2024 by [name] Arlyss RAMAN, MD.  Called Nurse Triage reporting Fatigue.  Symptoms began several months ago.  Symptoms are: gradually worsening.  Triage Disposition: See PCP When Office is Open (Within 3 Days)  Patient/caregiver understands and will follow disposition?: Yes       Copied from CRM #[id number]. Topic: Clinical - Red Word Triage >> Sep 04, 2024 12:44 PM Taleah C wrote: Red Word that prompted transfer to Nurse Triage: fatigue, worsening        Reason for Disposition  [1] Fatigue (i.e., tires easily, decreased energy) AND [2] persists > 1 week  Answer Assessment - Initial Assessment Questions Patient's husband states that if her symptoms worsen before her appointment on Monday he will take her to the ED.       1. DESCRIPTION: Describe how you are feeling.     Worsening fatigue  2. SEVERITY: How bad is it?  Can you stand and walk?     Moderate 3. ONSET: When did these symptoms begin? (e.g., hours, days, weeks, months)     Ongoing problem for patient  4. CAUSE: What do you think is causing the weakness or fatigue? (e.g., not drinking enough fluids, medical problem, trouble sleeping)     Possible low sodium  5. NEW MEDICINES:  Have you started on any new medicines recently? (e.g., opioid pain medicines, benzodiazepines, muscle relaxants, antidepressants, antihistamines, neuroleptics, beta blockers)     No 6. OTHER SYMPTOMS: Do you have any other symptoms? (e.g., chest pain, fever, cough, SOB, vomiting, diarrhea, bleeding, other areas of pain)     No  Protocols used: Weakness (Generalized) and Fatigue-A-AH

## 2024-09-04 NOTE — Telephone Encounter (Signed)
 Noted. Thanks.

## 2024-09-04 NOTE — Telephone Encounter (Signed)
 Referral faxed to Nephrology  See referral for updates.   Patient notified via MyChart.

## 2024-09-05 DIAGNOSIS — F32A Depression, unspecified: Secondary | ICD-10-CM

## 2024-09-05 DIAGNOSIS — K222 Esophageal obstruction: Secondary | ICD-10-CM | POA: Diagnosis not present

## 2024-09-05 DIAGNOSIS — K2100 Gastro-esophageal reflux disease with esophagitis, without bleeding: Secondary | ICD-10-CM | POA: Diagnosis not present

## 2024-09-05 DIAGNOSIS — G4733 Obstructive sleep apnea (adult) (pediatric): Secondary | ICD-10-CM | POA: Diagnosis not present

## 2024-09-05 DIAGNOSIS — M810 Age-related osteoporosis without current pathological fracture: Secondary | ICD-10-CM | POA: Diagnosis not present

## 2024-09-05 DIAGNOSIS — H269 Unspecified cataract: Secondary | ICD-10-CM | POA: Diagnosis not present

## 2024-09-05 DIAGNOSIS — H15001 Unspecified scleritis, right eye: Secondary | ICD-10-CM | POA: Diagnosis not present

## 2024-09-05 DIAGNOSIS — I495 Sick sinus syndrome: Secondary | ICD-10-CM | POA: Diagnosis not present

## 2024-09-05 DIAGNOSIS — F419 Anxiety disorder, unspecified: Secondary | ICD-10-CM

## 2024-09-05 DIAGNOSIS — E871 Hypo-osmolality and hyponatremia: Secondary | ICD-10-CM | POA: Diagnosis not present

## 2024-09-05 DIAGNOSIS — E7800 Pure hypercholesterolemia, unspecified: Secondary | ICD-10-CM | POA: Diagnosis not present

## 2024-09-05 DIAGNOSIS — I119 Hypertensive heart disease without heart failure: Secondary | ICD-10-CM | POA: Diagnosis not present

## 2024-09-06 ENCOUNTER — Telehealth: Payer: Self-pay | Admitting: Family Medicine

## 2024-09-06 NOTE — Telephone Encounter (Signed)
 Please check with renal clinic/referral department.  I got a note from her husband that the referral had been declined.  Please verify this with the renal clinic.  I would like her to be seen by the renal clinic.  If they refuse to see her locally, please let me know.  Thanks.

## 2024-09-07 ENCOUNTER — Telehealth: Payer: Self-pay | Admitting: Family Medicine

## 2024-09-07 ENCOUNTER — Ambulatory Visit (INDEPENDENT_AMBULATORY_CARE_PROVIDER_SITE_OTHER): Admitting: Family Medicine

## 2024-09-07 ENCOUNTER — Encounter: Payer: Self-pay | Admitting: Family Medicine

## 2024-09-07 VITALS — BP 130/78 | HR 80 | Temp 98.5°F | Ht 63.0 in | Wt 142.2 lb

## 2024-09-07 DIAGNOSIS — H34811 Central retinal vein occlusion, right eye, with macular edema: Secondary | ICD-10-CM | POA: Diagnosis not present

## 2024-09-07 DIAGNOSIS — R5381 Other malaise: Secondary | ICD-10-CM | POA: Diagnosis not present

## 2024-09-07 DIAGNOSIS — E871 Hypo-osmolality and hyponatremia: Secondary | ICD-10-CM

## 2024-09-07 MED ORDER — SODIUM CHLORIDE 1 G PO TABS: 1.0000 g | ORAL_TABLET | Freq: Every day | ORAL | Status: DC

## 2024-09-07 NOTE — Telephone Encounter (Signed)
 Please update referrals.  I called renal clinic to appeal the decision re: referral.  I put in new stat referral to Dr. Windle at renal.  Please update patient/husband.  Let me know if she can't get an appointment at renal.  Thanks.

## 2024-09-07 NOTE — Patient Instructions (Signed)
 Go to the lab on the way out.   If you have mychart we'll likely use that to update you.    Take care.  Glad to see you. We'll check with the renal clinic in the meantime.

## 2024-09-07 NOTE — Telephone Encounter (Signed)
 Closing encounter. Patient was seen in office today and paperwork was provided

## 2024-09-07 NOTE — Progress Notes (Addendum)
 She was able to tolerate a max dose of 1 salt tab per day.  She is trying to add more salt to her diet.  Discussed previous events with hyponatremia.  Recheck labs pending today.  She is taking 1/2 protein shake per day.  Discussed trying to increase that.  She had less burning in her chest with taking prilosec.    She is frustrated about her level of fatigue.  She is using her walker again with recent relative increase in imbalance.  Given her history of imbalance and hip pain/trochanteric bursitis she would benefit from the use of a bedside commode to decrease her fall risk.  Meds, vitals, and allergies reviewed.   ROS: Per HPI unless specifically indicated in ROS section   GEN: nad, alert and oriented, she appears fatigued but not short of breath HEENT: MMM NECK: supple w/o LA CV: rrr. PULM: ctab, no inc wob ABD: soft, +bs EXT: no edema SKIN: Well-perfused.

## 2024-09-08 LAB — BASIC METABOLIC PANEL WITH GFR
BUN: 18 mg/dL (ref 6–23)
CO2: 27 meq/L (ref 19–32)
Calcium: 9.2 mg/dL (ref 8.4–10.5)
Chloride: 96 meq/L (ref 96–112)
Creatinine, Ser: 0.88 mg/dL (ref 0.40–1.20)
GFR: 63.67 mL/min (ref >60.00)
Glucose, Bld: 90 mg/dL (ref 70–99)
Potassium: 4.7 meq/L (ref 3.5–5.1)
Sodium: 133 meq/L — ABNORMAL LOW (ref 135–145)

## 2024-09-08 NOTE — Assessment & Plan Note (Signed)
 Ongoing hyponatremia, previously hospitalized.  I called the renal clinic today to restart her referral process.  Apparently her referral was declined previously.  She is still fatigued, with fatigue increasing.  Unclear if hyponatremia is causing her fatigue or if a separate issue is causing both the fatigue and the hyponatremia.  I need renal input.  Discussed with patient.  Continue salt replacement in the meantime.  See notes on labs.

## 2024-09-08 NOTE — Telephone Encounter (Signed)
 I sent all this in a referral message this morning to Dr Cleatus. Nephrology would not schedule an urgent appt with me. They requested records so they can review them. I advised that the records have been faxed and they should be getting them. They stated that they would review once they receive them then contact the patient to schedule.  Patient is already established with Nephrology since she was treated by one of their MDs while admitted.    ----- Message ----- From: Landy Gaylon HERO, CMA Sent: 09/08/2024   9:42 AM EDT To: Arlyss GORMAN Cleatus, MD   Referral faxed this morning -- they do have the old referral but they were not able to tell me why she was never scheduled from that referral.    Pt seen in hospital by Nephrologist about 1 month ago - Pt is already established with them. They stated that they just needed updated OV notes at this point. They are aware that I have faxed the most recent OV with PCP and labs to them this morning. They would not schedule an appt with me - stated that they have to follow their review process before scheduling an appt and they will call the patient to schedule once their review process is done. They were not able to give any time frame as to how long this review process will take.    You may have better luck call them personally and discussing with the schedulers/MDs  [facility] [place] [phone]

## 2024-09-08 NOTE — Telephone Encounter (Signed)
 Noted. Thanks.

## 2024-09-09 ENCOUNTER — Ambulatory Visit: Payer: Self-pay | Admitting: Family Medicine

## 2024-09-10 ENCOUNTER — Ambulatory Visit (INDEPENDENT_AMBULATORY_CARE_PROVIDER_SITE_OTHER)

## 2024-09-10 ENCOUNTER — Ambulatory Visit

## 2024-09-10 VITALS — BP 130/78 | Ht 63.0 in | Wt 142.0 lb

## 2024-09-10 DIAGNOSIS — Z0000 Encounter for general adult medical examination without abnormal findings: Secondary | ICD-10-CM

## 2024-09-10 NOTE — Progress Notes (Signed)
 Because this visit was a virtual/telehealth visit,  certain criteria was not obtained, such a blood pressure, CBG if applicable, and timed get up and go. Any medications not marked as taking were not mentioned during the medication reconciliation part of the visit. Any vitals not documented were not able to be obtained due to this being a telehealth visit or patient was unable to self-report a recent blood pressure reading due to a lack of equipment at home via telehealth. Vitals that have been documented are verbally provided by the patient.   This visit was performed by a medical professional under my direct supervision. I was immediately available for consultation/collaboration. I have reviewed and agree with the Annual Wellness Visit documentation.  Subjective:   [name] is a 78 y.o. who presents for a Medicare Wellness preventive visit.  As a reminder, Annual Wellness Visits don't include a physical exam, and some assessments may be limited, especially if this visit is performed virtually. We may recommend an in-person follow-up visit with your provider if needed.  Visit Complete: Virtual I connected with  [name] on 09/10/24 by a audio enabled telemedicine application and verified that I am speaking with the correct person using two identifiers.  Patient Location: Home  Provider Location: Home Office  I discussed the limitations of evaluation and management by telemedicine. The patient expressed understanding and agreed to proceed.  Vital Signs: Because this visit was a virtual/telehealth visit, some criteria may be missing or patient reported. Any vitals not documented were not able to be obtained and vitals that have been documented are patient reported.  VideoDeclined- This patient declined Librarian, academic. Therefore the visit was completed with audio only.  Persons Participating in Visit: Patient.  AWV Questionnaire: Yes: Patient Medicare  AWV questionnaire was completed by the patient on 09/10/2024; I have confirmed that all information answered by patient is correct and no changes since this date.  Cardiac Risk Factors include: advanced age (>56men, >69 women)     Objective:    Today's Vitals   09/10/24 1631  BP: 130/78  Weight: 142 lb (64.4 kg)  Height: 5' 3 (1.6 m)   Body mass index is 25.15 kg/m.     09/10/2024    4:31 PM 07/28/2024    6:00 PM 07/27/2024    5:48 PM 06/02/2024    8:03 PM 03/03/2024    8:39 PM 08/17/2023    3:38 PM 11/29/2020    7:35 AM  Advanced Directives  Does Patient Have a Medical Advance Directive? Yes No No Yes Yes Yes Yes  Type of Estate agent of [place];Living will   Healthcare Power of [place];Living will Healthcare Power of [place];Living will    Does patient want to make changes to medical advance directive? No - Patient declined   No - Patient declined No - Patient declined  No - Guardian declined  Copy of Healthcare Power of Attorney in Chart? Yes - validated most recent copy scanned in chart (See row information)   No - copy requested No - copy requested    Would patient like information on creating a medical advance directive?  No - Patient declined     No - Patient declined    Current Medications (verified) Outpatient Encounter Medications as of 09/10/2024  Medication Sig   acetaminophen  (TYLENOL ) 500 MG tablet Take 1,000 mg by mouth every 6 (six) hours as needed (pain).   diltiazem  (CARDIZEM  CD) 120 MG 24 hr capsule Take  1 capsule (120 mg total) by mouth daily. Please call [phone] to schedule an appointment for future refills. Thank you.   diltiazem  (CARDIZEM ) 30 MG tablet Take 1 tablet (30 mg total) by mouth daily as needed.   famotidine  (PEPCID ) 20 MG tablet Take 1 tablet by mouth twice daily   LORazepam  (ATIVAN ) 1 MG tablet TAKE 1 TABLET BY MOUTH EVERY 6 HOURS AS NEEDED   omeprazole  (PRILOSEC) 20 MG capsule Take 1 capsule (20 mg total) by mouth  daily.   Polyethyl Glycol-Propyl Glycol (SYSTANE OP) Place 1 drop into both eyes daily as needed (dry eyes).    rosuvastatin  (CRESTOR ) 10 MG tablet Take 0.5 tablets (5 mg total) by mouth daily.   sodium chloride  1 g tablet Take 1 tablet (1 g total) by mouth daily.   No facility-administered encounter medications on file as of 09/10/2024.    Allergies (verified) Cymbalta  [duloxetine  hcl]; Hydrocodone; Antihistamines, loratadine-type; Cephalexin; Citalopram; Codeine; Demerol [meperidine]; Epinephrine ; Erythromycin; Hydrochlorothiazide ; Hyoscyamine  sulfate; Inderal [propranolol]; Lidocaine ; Lipitor [atorvastatin]; Losartan potassium; Morphine; Nebivolol; Paxil [paroxetine hcl]; Penicillins; Prednisone; Propranolol hcl; Sertraline hcl; Tetracycline; Toprol  xl [metoprolol  succinate]; Verapamil; Hydralazine ; and Oxycodone -acetaminophen    History: Past Medical History:  Diagnosis Date   Adenomatous colon polyp 12/2006   Allergic rhinitis    Anemia    Anxiety    Appendicitis 04/2020   Arthritis    Bradycardia    s/p MDT PPM   Cataract    bilateral   Clotting disorder September 26, 2017   Depression    Esophageal stricture    Family history of adverse reaction to anesthesia    maternal uncle with MH, nephew had MH during surgery and was OK, pt not been tested   GERD (gastroesophageal reflux disease)    Heart murmur    Hemorrhoids    Hyperlipidemia    Hypertension    Inguinal hernia    Osteoporosis    Presence of permanent cardiac pacemaker 2006   SSS   PVC (premature ventricular contraction)    Scleritis of right eye    Skin cancer    Basal and squamous cell   Sleep apnea    a. intolerant of CPAP, does wear a mouthpiece   Subdural hematoma (HCC)    a. spontanous 09/2017 b. resolved without intervention   Thyroid  disease    Past Surgical History:  Procedure Laterality Date   ABDOMINAL HYSTERECTOMY     APPENDECTOMY     BREAST BIOPSY Left 01/15/2014   BREAST BIOPSY Right  06/25/2023   US  RT BREAST BX W LOC DEV 1ST LESION IMG BX SPEC US  GUIDE 06/25/2023 [facility]MO[facility]   BREAST SURGERY     Biopsy-benign   CESAREAN SECTION     COLONOSCOPY  2018   DILATION AND CURETTAGE OF UTERUS     ESOPHAGOGASTRODUODENOSCOPY     esophageal stretching   EYE SURGERY     HAND SURGERY  1992   HERNIA REPAIR     INGUINAL HERNIA REPAIR  02/12/1994   left inguinal hernia repair   JOINT REPLACEMENT  12/21/2014   KNEE ARTHROSCOPY  2011   LAPAROSCOPIC APPENDECTOMY N/A 11/29/2020   Procedure: APPENDECTOMY LAPAROSCOPIC;  Surgeon: Paola Dreama SAILOR, MD;  Location: [facility];  Service: General;  Laterality: N/A;   OOPHORECTOMY  1995   LSO and RSO   PACEMAKER GENERATOR CHANGE N/A 01/21/2015   Procedure: PACEMAKER GENERATOR CHANGE;  Surgeon: Lynwood Rakers, MD;  Location: [facility] CATH [facility];  Service: Cardiovascular;  Laterality: N/A;   PACEMAKER  INSERTION  2006   Dr. Ellin   POLYPECTOMY     Tear duct surgery  2010   THUMB SURGERY     THYROIDECTOMY  1979   RIGHT SIDE   TOTAL KNEE ARTHROPLASTY Right 12/20/2014   Procedure: RIGHT TOTAL KNEE ARTHROPLASTY;  Surgeon: Marcey Raman, MD;  Location: [facility] OR;  Service: Orthopedics;  Laterality: Right;   UPPER GASTROINTESTINAL ENDOSCOPY     unsure   Family History  Problem Relation Age of Onset   Heart failure Sister    Heart disease Sister    Hypertension Sister    Colon polyps Sister    Arthritis Sister    Depression Sister    Dementia Mother    Hypertension Mother    Colon polyps Mother    Anxiety disorder Mother    Hypertension Father    Arthritis Father    Hyperlipidemia Sister    Breast cancer Maternal Aunt        Age 63's   Diabetes Maternal Aunt    Diabetes Maternal Uncle    Ovarian cancer Maternal Aunt    Colon cancer Neg Hx    Esophageal cancer Neg Hx    Stomach cancer Neg Hx    Rectal cancer Neg Hx    Social History   Socioeconomic History   Marital status: Married    Spouse name: Actor   Number of  children: 1   Years of education: Not on file   Highest education level: 12th grade  Occupational History   Occupation: Retired    Associate Professor: UNEMPLOYED  Tobacco Use   Smoking status: Never   Smokeless tobacco: Never  Vaping Use   Vaping status: Never Used  Substance and Sexual Activity   Alcohol  use: No    Alcohol /week: 0.0 standard drinks of alcohol    Drug use: No   Sexual activity: Not Currently    Birth control/protection: Surgical, Post-menopausal    Comment: HYST-1st intercourse 78 yo-fewer than 5 partners  Other Topics Concern   Not on file  Social History Narrative   Education:  12th grade   Married 1971   1 son in [place] , 4 grandkids   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   Social Drivers of Corporate investment banker Strain: Low Risk  (09/10/2024)   Overall Financial Resource Strain (CARDIA)    Difficulty of Paying Living Expenses: Not hard at all  Food Insecurity: No Food Insecurity (09/10/2024)   Hunger Vital Sign    Worried About Running Out of Food in the Last Year: Never true    Ran Out of Food in the Last Year: Never true  Transportation Needs: No Transportation Needs (09/10/2024)   PRAPARE - Administrator, Civil Service (Medical): No    Lack of Transportation (Non-Medical): No  Physical Activity: Inactive (09/10/2024)   Exercise Vital Sign    Days of Exercise per Week: 0 days    Minutes of Exercise per Session: 0 min  Stress: Stress Concern Present (09/10/2024)   [employer] of Occupational Health - Occupational Stress Questionnaire    Feeling of Stress: To some extent  Social Connections: Socially Integrated (09/10/2024)   Social Connection and Isolation Panel    Frequency of Communication with Friends and Family: More than three times a week    Frequency of Social Gatherings with Friends and Family: Patient declined    Attends Religious Services: More than 4 times per year    Active Member of [employer] or Organizations: Patient declined  Attends Engineer, structural: More than 4 times per year    Marital Status: Married    Tobacco Counseling Counseling given: Not Answered    Clinical Intake:  Pre-visit preparation completed: Yes  Pain : No/denies pain     BMI - recorded: 25.15 Nutritional Status: BMI 25 -29 Overweight Nutritional Risks: None Diabetes: No  No results found for: HGBA1C   How often do you need to have someone help you when you read instructions, pamphlets, or other written materials from your doctor or pharmacy?: 1 - Never  Interpreter Needed?: No  Information entered by :: Raza Bayless,CMA   Activities of Daily Living     09/10/2024   12:54 PM 09/06/2024    1:48 PM  In your present state of health, do you have any difficulty performing the following activities:  Hearing? 0 0  Vision? 0 0  Difficulty concentrating or making decisions? 1 1  Walking or climbing stairs? 1 1  Dressing or bathing? 0 0  Doing errands, shopping? 1 1  Preparing Food and eating ? N N  Using the Toilet? N N  In the past six months, have you accidently leaked urine? Y Y  Do you have problems with loss of bowel control? N N  Managing your Medications? Y Y  Managing your Finances? Y Y  Housekeeping or managing your Housekeeping? N N    Patient Care Team: Cleatus Arlyss RAMAN, MD as PCP - General (Family Medicine) Mealor, Eulas BRAVO, MD as PCP - Electrophysiology (Cardiology) Verlin Lonni BIRCH, MD as PCP - Cardiology (Cardiology) Kelsie Agent, MD (Inactive) as Attending Physician (Cardiology) Neysa Reggy BIRCH, MD (Pulmonary Disease) Rubie Kemps, MD (Orthopedic Surgery) Verlin Lonni BIRCH, MD as Consulting Physician (Cardiology) Joshua Blamer, MD as Consulting Physician (Dermatology) Rockney, Evalene SQUIBB, MD (Inactive) as Consulting Physician (Gynecology) Leslee Reusing, MD as Consulting Physician (Ophthalmology) Fate Morna SAILOR, [facility] (Inactive) as Pharmacist (Pharmacist)  I  have updated your Care Teams any recent Medical Services you may have received from other providers in the past year.     Assessment:   This is a routine wellness examination for [name].  Hearing/Vision screen Hearing Screening - Comments:: Patient has no difficulties  Vision Screening - Comments:: Patient wears glasses   Goals Addressed             This Visit's Progress    Patient Stated       To get physically stronger        Depression Screen     09/10/2024    4:35 PM 08/04/2024   10:30 AM 05/21/2024    3:51 PM 01/16/2024   11:52 AM 11/18/2023    3:38 PM 10/22/2023    9:38 AM 09/20/2023    2:47 PM  PHQ 2/9 Scores  PHQ - 2 Score 1 0 0 3 3 2 1   PHQ- 9 Score 4   12 15 11      Fall Risk     09/10/2024    4:33 PM 09/10/2024   12:54 PM 09/06/2024    1:48 PM 05/21/2024    3:51 PM 03/09/2024   11:40 AM  Fall Risk   Falls in the past year? 1 1 1  0 0  Number falls in past yr: 0 1 1 0 0  Injury with Fall? 0 0 0 0 0  Risk for fall due to : History of fall(s);Impaired balance/gait;Impaired mobility   No Fall Risks No Fall Risks  Follow up Falls evaluation completed  Falls evaluation completed Falls evaluation completed    MEDICARE RISK AT HOME:  Medicare Risk at Home Any stairs in or around the home?: (Patient-Rptd) Yes If so, are there any without handrails?: (Patient-Rptd) No Home free of loose throw rugs in walkways, pet beds, electrical cords, etc?: (Patient-Rptd) Yes Adequate lighting in your home to reduce risk of falls?: (Patient-Rptd) Yes Life alert?: (Patient-Rptd) No Use of a cane, walker or w/c?: (Patient-Rptd) Yes Grab bars in the bathroom?: (Patient-Rptd) No Shower chair or bench in shower?: (Patient-Rptd) Yes Elevated toilet seat or a handicapped toilet?: (Patient-Rptd) No  TIMED UP AND GO:  Was the test performed?  No  Cognitive Function: 6CIT completed    07/15/2020    1:38 PM 05/29/2018   11:34 AM 05/13/2017   10:59 AM 05/10/2016   10:24 AM  MMSE -  Mini Mental State Exam  Orientation to time 5 5 5  5    Orientation to Place 5 5 5  5    Registration 3 3 3  3    Attention/ Calculation 5 0 0  0   Recall 3 3 3  3    Language- name 2 objects  0 0  0   Language- repeat 1 1 1 1   Language- follow 3 step command  3 3  3    Language- read & follow direction  0 0  0   Write a sentence  0 0  0   Copy design  0 0  0   Total score  20 20  20       Data saved with a previous flowsheet row definition        09/10/2024    4:37 PM  6CIT Screen  What Year? 0 points  What month? 0 points  What time? 0 points  Count back from 20 0 points  Months in reverse 0 points  Repeat phrase 0 points  Total Score 0 points    Immunizations Immunization History  Administered Date(s) Administered   Fluad Quad(high Dose 65+) 09/03/2019, 09/01/2020, 08/30/2022   Fluad Trivalent(High Dose 65+) 09/26/2023   Influenza Split 08/21/2012   Influenza,inj,Quad PF,6+ Mos 09/11/2013, 08/25/2014, 09/05/2015, 09/03/2016, 09/03/2017, 08/28/2018   Influenza-Unspecified 09/20/2021   PFIZER(Purple Top)SARS-COV-2 Vaccination 01/01/2020, 01/23/2020, 09/26/2020   Pfizer Covid-19 Vaccine Bivalent Booster 85yrs & up 10/09/2021   Pfizer(Comirnaty)Fall Seasonal Vaccine 12 years and older 02/26/2023   Pneumococcal Conjugate-13 09/05/2015   Pneumococcal Polysaccharide-23 10/11/2014   Tdap 07/04/2007, 07/31/2018   Zoster, Live 06/04/2012    Screening Tests Health Maintenance  Topic Date Due   Zoster Vaccines- Shingrix (1 of 2) 11/23/1966   Influenza Vaccine  07/03/2024   COVID-19 Vaccine (6 - 2025-26 season) 08/03/2024   Colonoscopy  07/04/2025   Medicare Annual Wellness (AWV)  09/10/2025   DTaP/Tdap/Td (3 - Td or Tdap) 07/31/2028   Pneumococcal Vaccine: 50+ Years  Completed   DEXA SCAN  Completed   Hepatitis C Screening  Completed   Meningococcal B Vaccine  Aged Out   Mammogram  Discontinued    Health Maintenance Items Addressed:patient declined   Additional  Screening:  Vision Screening: Recommended annual ophthalmology exams for early detection of glaucoma and other disorders of the eye. Is the patient up to date with their annual eye exam?  Yes  Who is the provider or what is the name of the office in which the patient attends annual eye exams? Dr. Leslee   Dental Screening: Recommended annual dental exams for proper oral hygiene  Community Resource Referral /  Chronic Care Management: CRR required this visit?  No   CCM required this visit?  No   Plan:    I have personally reviewed and noted the following in the patient's chart:   Medical and social history Use of alcohol , tobacco or illicit drugs  Current medications and supplements including opioid prescriptions. Patient is not currently taking opioid prescriptions. Functional ability and status Nutritional status Physical activity Advanced directives List of other physicians Hospitalizations, surgeries, and ER visits in previous 12 months Vitals Screenings to include cognitive, depression, and falls Referrals and appointments  In addition, I have reviewed and discussed with patient certain preventive protocols, quality metrics, and best practice recommendations. A written personalized care plan for preventive services as well as general preventive health recommendations were provided to patient.   Lyle [name] Right, [place]   09/10/2024   After Visit Summary: (MyChart) Due to this being a telephonic visit, the after visit summary with patients personalized plan was offered to patient via MyChart   Notes: Nothing significant to report at this time.

## 2024-09-10 NOTE — Patient Instructions (Signed)
 [name],  Thank you for taking the time for your Medicare Wellness Visit. I appreciate your continued commitment to your health goals. Please review the care plan we discussed, and feel free to reach out if I can assist you further.  Medicare recommends these wellness visits once per year to help you and your care team stay ahead of potential health issues. These visits are designed to focus on prevention, allowing your provider to concentrate on managing your acute and chronic conditions during your regular appointments.  Please note that Annual Wellness Visits do not include a physical exam. Some assessments may be limited, especially if the visit was conducted virtually. If needed, we may recommend a separate in-person follow-up with your provider.  Ongoing Care Seeing your primary care provider every 3 to 6 months helps us  monitor your health and provide consistent, personalized care.   Referrals If a referral was made during today's visit and you haven't received any updates within two weeks, please contact the referred provider directly to check on the status.  Recommended Screenings:  Health Maintenance  Topic Date Due   Zoster (Shingles) Vaccine (1 of 2) 11/23/1966   Flu Shot  07/03/2024   COVID-19 Vaccine (6 - 2025-26 season) 08/03/2024   Colon Cancer Screening  07/04/2025   Medicare Annual Wellness Visit  09/10/2025   DTaP/Tdap/Td vaccine (3 - Td or Tdap) 07/31/2028   Pneumococcal Vaccine for age over 50  Completed   DEXA scan (bone density measurement)  Completed   Hepatitis C Screening  Completed   Meningitis B Vaccine  Aged Out   Breast Cancer Screening  Discontinued       09/10/2024    4:31 PM  Advanced Directives  Does Patient Have a Medical Advance Directive? Yes  Type of Estate agent of [place];Living will  Does patient want to make changes to medical advance directive? No - Patient declined  Copy of Healthcare Power of Attorney in  Chart? Yes - validated most recent copy scanned in chart (See row information)   Advance Care Planning is important because it: Ensures you receive medical care that aligns with your values, goals, and preferences. Provides guidance to your family and loved ones, reducing the emotional burden of decision-making during critical moments.  Vision: Annual vision screenings are recommended for early detection of glaucoma, cataracts, and diabetic retinopathy. These exams can also reveal signs of chronic conditions such as diabetes and high blood pressure.  Dental: Annual dental screenings help detect early signs of oral cancer, gum disease, and other conditions linked to overall health, including heart disease and diabetes.  Please see the attached documents for additional preventive care recommendations.

## 2024-09-11 ENCOUNTER — Telehealth: Payer: Self-pay

## 2024-09-11 NOTE — Telephone Encounter (Signed)
 Copied from CRM [id number]. Topic: Appointments - Appointment Info/Confirmation >> Sep 10, 2024  2:27 PM Franky GRADE wrote: Patient/patient representative is calling for information regarding an appointment.  Patient is scheduled for an AWV today at 1:40 pm but has not gotten a call, called [facility] to see what was causing the delay; however, they informed me patient should be receiving a call. They want to complete this visit today and do not want to reschedule.

## 2024-09-11 NOTE — Telephone Encounter (Signed)
Appt was done

## 2024-09-14 ENCOUNTER — Telehealth: Payer: Self-pay

## 2024-09-14 DIAGNOSIS — R5381 Other malaise: Secondary | ICD-10-CM | POA: Insufficient documentation

## 2024-09-14 NOTE — Telephone Encounter (Signed)
Please send the order.  Thanks.  

## 2024-09-14 NOTE — Telephone Encounter (Signed)
 Deconditioning R53.81, trochanteric bursitis M70.60.  Thanks.

## 2024-09-14 NOTE — Telephone Encounter (Signed)
 Copied from CRM [id number]. Topic: Clinical - Order For Equipment >> Sep 14, 2024  9:10 AM [name] wrote: Reason for CRM: Occupational Therapist [name] from [facility] called in stating that pt PCP requested pt to have a 3 in 1 commode. [name] would like that order to be faxed over to [employer] or any medical supply company of their choice. [name] also agreed that if any more information is needed she can be contacted at [phone].

## 2024-09-14 NOTE — Telephone Encounter (Signed)
 Please advise what diagnosis code to use for DME order.

## 2024-09-14 NOTE — Assessment & Plan Note (Signed)
 See above. Continue using walker.

## 2024-09-15 ENCOUNTER — Other Ambulatory Visit: Payer: Self-pay

## 2024-09-15 DIAGNOSIS — R5381 Other malaise: Secondary | ICD-10-CM

## 2024-09-15 DIAGNOSIS — M7061 Trochanteric bursitis, right hip: Secondary | ICD-10-CM

## 2024-09-15 NOTE — Telephone Encounter (Signed)
 Order has been placed.

## 2024-09-18 NOTE — Telephone Encounter (Signed)
 Last office visit faxed as requested by fax.

## 2024-09-20 ENCOUNTER — Telehealth: Payer: Self-pay | Admitting: Family Medicine

## 2024-09-20 DIAGNOSIS — E871 Hypo-osmolality and hyponatremia: Secondary | ICD-10-CM

## 2024-09-20 NOTE — Telephone Encounter (Signed)
 Please check with the referral department.  I want this patient to be seen at the kidney clinic.  Please check with the referrals and make sure they are following up so that she will get an appointment.  Please see her MyChart message.  Thanks.

## 2024-09-24 NOTE — Telephone Encounter (Signed)
 As previously stated in a referral message 09/08/24, we have no pull over their scheduling processes. They refuse to schedule with us  over the phone. They have a review process they have to follow and cannot schedule outside that review process. One the referral is reviewed and rated that will call the patient to schedule.    Referral message   ----- Message ----- From: Landy Gaylon HERO, CMA Sent: 09/08/2024   9:42 AM EDT To: Arlyss GORMAN Solian, MD   Referral faxed this morning -- they do have the old referral but they were not able to tell me why she was never scheduled from that referral.    Pt seen in hospital by Nephrologist about 1 month ago - Pt is already established with them. They stated that they just needed updated OV notes at this point. They are aware that I have faxed the most recent OV with PCP and labs to them this morning. They would not schedule an appt with me - stated that they have to follow their review process before scheduling an appt and they will call the patient to schedule once their review process is done. They were not able to give any time frame as to how long this review process will take.    You may have better luck call them personally and discussing with the schedulers/MDs  [facility] [place] [phone]   I spoke with the office again today 09/24/24 and they stated that same thing as our previous conversation. The new referral that has been faxed (October) has not been reviewed or rated yet. We have faxed this referral several times to their office and they keep stating that it either has not been received or has been received but not rated yet. I have asked several times during our conversation how to go about getting this patient scheduled as this is the SECOND referral sent to them; the first referral they received but they have no idea why it was never scheduled, and now they are requesting that we resend another referral instead of allowing us  to schedule off the  old referral in their system - Dr Solian placed a new referral which has been faxed in October to their office.  Honestly at this point, there is nothing more that can be done from a referral stand point, we have faxed the referral several times, we cannot get a straight answer of it they do or do not have the referral, they still have the old referral in their system and they refuse to schedule directly with Me.   Dr Solian, You are going to have better luck calling them personally and discussing with the schedulers/MDs  [facility] [place] [phone]  Another option is to refer the patient elsewhere and not use [facility] in [place]

## 2024-09-25 NOTE — Telephone Encounter (Signed)
 See 10/19 telephone encounter  Nephrology has her referral, they are just waiting for it to be processed, rated and then the patient can be scheduled.

## 2024-09-27 NOTE — Telephone Encounter (Signed)
 I had called the renal clinic prev.  I put in another referral. Please send the referral to another clinic. Thanks.

## 2024-09-27 NOTE — Addendum Note (Signed)
 Addended by: CLEATUS ARLYSS RAMAN on: 09/27/2024 08:25 PM   Modules accepted: Orders

## 2024-09-30 DIAGNOSIS — E871 Hypo-osmolality and hyponatremia: Secondary | ICD-10-CM | POA: Diagnosis not present

## 2024-09-30 DIAGNOSIS — R5383 Other fatigue: Secondary | ICD-10-CM | POA: Diagnosis not present

## 2024-09-30 NOTE — Telephone Encounter (Signed)
 I called and confirmed with [facility] that the patient is in fact scheduled for today 09/30/24.   The referral has been updated and correct appt information added to her chart.

## 2024-09-30 NOTE — Telephone Encounter (Signed)
 Noted. Thanks.

## 2024-09-30 NOTE — Telephone Encounter (Signed)
 Disregard, I see the new referral!  I will handle this now. Thank you!

## 2024-10-07 ENCOUNTER — Encounter: Payer: Self-pay | Admitting: Cardiovascular Disease

## 2024-10-08 ENCOUNTER — Ambulatory Visit: Admitting: Family Medicine

## 2024-10-08 VITALS — BP 126/84 | HR 78 | Temp 98.6°F | Ht 63.0 in | Wt 142.0 lb

## 2024-10-08 DIAGNOSIS — R29898 Other symptoms and signs involving the musculoskeletal system: Secondary | ICD-10-CM

## 2024-10-08 NOTE — Progress Notes (Signed)
 She had renal eval in the meantime.  She is off salt tabs per renal recommendation.  Requesting labs from renal clinic.    D/w pt about long covid possibility and referral to tertiary care center.  She is still dealing with fatigue, brain fog, mood changes.    She has CT pending. She didn't feel well enough to get a flu shot today.   She is diffusely deconditioned.  She needs a DME order for 3 in 1 shower chair/bedside commode.  That would help her shower and toilet more safely.  Meds, vitals, and allergies reviewed.   ROS: Per HPI unless specifically indicated in ROS section   Nad Ncat Neck supple, no LA Rrr Ctab Abd soft, not ttp Skin well-perfused. Gait is slower than her previous baseline due to muscular deconditioning. She appears tired but not sedated.

## 2024-10-08 NOTE — Patient Instructions (Addendum)
 Let me check on options re: long covid evaluation.  Take care.  Glad to see you.

## 2024-10-11 DIAGNOSIS — R29898 Other symptoms and signs involving the musculoskeletal system: Secondary | ICD-10-CM | POA: Insufficient documentation

## 2024-10-11 NOTE — Assessment & Plan Note (Signed)
 She is diffusely deconditioned.  She needs a DME order for 3 in 1 shower chair/bedside commode.  That would help her shower and toilet more safely.  I am going to check with staff about a DME order.  I question if her symptoms are related to long COVID.  I am going to check about possible referral to a tertiary care center.  Discussed.

## 2024-10-13 ENCOUNTER — Ambulatory Visit (HOSPITAL_COMMUNITY)
Admission: RE | Admit: 2024-10-13 | Discharge: 2024-10-13 | Disposition: A | Discharge status: Home / Self Care | Payer: Medicare Other | Source: Ambulatory Visit | Attending: Cardiovascular Disease | Admitting: Cardiovascular Disease

## 2024-10-13 DIAGNOSIS — I712 Thoracic aortic aneurysm, without rupture, unspecified: Secondary | ICD-10-CM | POA: Diagnosis present

## 2024-10-13 MED ORDER — IOHEXOL 350 MG/ML SOLN
75.0000 mL | Freq: Once | INTRAVENOUS | Status: AC | PRN
  Administered 2024-10-13: 75 mL via INTRAVENOUS

## 2024-10-14 ENCOUNTER — Telehealth: Payer: Self-pay | Admitting: Family Medicine

## 2024-10-14 ENCOUNTER — Encounter: Payer: Self-pay | Admitting: Family Medicine

## 2024-10-14 DIAGNOSIS — R5383 Other fatigue: Secondary | ICD-10-CM

## 2024-10-14 NOTE — Telephone Encounter (Signed)
 Please call pt.  I called pulmonary clinic locally and they can see patient for eval re: long covid.  She doesn't have to have primary pulmonary symptoms.  I put in the referral and they should contact her about getting a visit scheduled.  Thanks.

## 2024-10-15 ENCOUNTER — Telehealth: Payer: Self-pay | Admitting: Cardiovascular Disease

## 2024-10-15 NOTE — Telephone Encounter (Signed)
 Received call transferred from operator and spoke with Channing at [facility].  She is calling to report CT findings of   4.1 cm ascending thoracic aortic aneurysm  1.4 cm poorly defined pulmonary nodule in posterior right upper lobe.  Report in chart

## 2024-10-15 NOTE — Telephone Encounter (Signed)
 [facility] calling to report critical results   Call Transferred

## 2024-10-15 NOTE — Telephone Encounter (Signed)
 Spoke with patient and gave her referral information for pulmonary.

## 2024-10-16 ENCOUNTER — Ambulatory Visit: Payer: Self-pay | Admitting: Cardiovascular Disease

## 2024-10-19 ENCOUNTER — Telehealth: Payer: Self-pay | Admitting: Family Medicine

## 2024-10-19 DIAGNOSIS — R911 Solitary pulmonary nodule: Secondary | ICD-10-CM

## 2024-10-19 NOTE — Telephone Encounter (Signed)
 Dr. Kassie- this patient has a visit scheduled with you on 11/11/24.  This appointment was scheduled before she had a scan through cardiology incidentally demonstrating a 1.4 cm poorly defined pulmonary nodule in posterior right upper lobe.   Can I get your advice re: repeat chest CT, follow-up PET-CT, or biopsy?  I greatly appreciate your help.

## 2024-10-19 NOTE — Telephone Encounter (Signed)
 See result note.

## 2024-11-04 ENCOUNTER — Ambulatory Visit (HOSPITAL_COMMUNITY)
Admission: RE | Admit: 2024-11-04 | Discharge: 2024-11-04 | Disposition: A | Source: Ambulatory Visit | Attending: Family Medicine | Admitting: Family Medicine

## 2024-11-04 DIAGNOSIS — R911 Solitary pulmonary nodule: Secondary | ICD-10-CM | POA: Insufficient documentation

## 2024-11-04 LAB — GLUCOSE, CAPILLARY: Glucose-Capillary: 103 mg/dL — ABNORMAL HIGH (ref 70–99)

## 2024-11-04 MED ORDER — FLUDEOXYGLUCOSE F - 18 (FDG) INJECTION
7.0000 | Freq: Once | INTRAVENOUS | Status: AC
  Administered 2024-11-04: 7 via INTRAVENOUS

## 2024-11-05 ENCOUNTER — Ambulatory Visit

## 2024-11-05 ENCOUNTER — Ambulatory Visit: Payer: Self-pay | Admitting: Cardiovascular Disease

## 2024-11-05 LAB — CUP PACEART REMOTE DEVICE CHECK
Battery Impedance: 1256 Ohm
Battery Remaining Longevity: 60 mo
Battery Voltage: 2.77 V
Brady Statistic AP VP Percent: 0 %
Brady Statistic AP VS Percent: 39 %
Brady Statistic AS VP Percent: 0 %
Brady Statistic AS VS Percent: 61 %
Date Time Interrogation Session: 20251204122811
Implantable Lead Connection Status: 753985
Implantable Lead Connection Status: 753985
Implantable Lead Implant Date: 20060620
Implantable Lead Implant Date: 20060620
Implantable Lead Location: 753859
Implantable Lead Location: 753860
Implantable Lead Model: 5076
Implantable Lead Model: 5076
Implantable Pulse Generator Implant Date: 20160219
Lead Channel Impedance Value: 654 Ohm
Lead Channel Impedance Value: 819 Ohm
Lead Channel Pacing Threshold Amplitude: 0.5 V
Lead Channel Pacing Threshold Amplitude: 0.875 V
Lead Channel Pacing Threshold Pulse Width: 0.4 ms
Lead Channel Pacing Threshold Pulse Width: 0.4 ms
Lead Channel Setting Pacing Amplitude: 2 V
Lead Channel Setting Pacing Amplitude: 2.5 V
Lead Channel Setting Pacing Pulse Width: 0.4 ms
Lead Channel Setting Sensing Sensitivity: 5.6 mV
Zone Setting Status: 755011
Zone Setting Status: 755011

## 2024-11-11 ENCOUNTER — Ambulatory Visit (HOSPITAL_BASED_OUTPATIENT_CLINIC_OR_DEPARTMENT_OTHER): Admitting: Pulmonary Disease

## 2024-11-11 ENCOUNTER — Encounter (HOSPITAL_BASED_OUTPATIENT_CLINIC_OR_DEPARTMENT_OTHER): Payer: Self-pay | Admitting: Pulmonary Disease

## 2024-11-11 ENCOUNTER — Encounter: Payer: Self-pay | Admitting: Family Medicine

## 2024-11-11 ENCOUNTER — Ambulatory Visit: Payer: Self-pay | Admitting: Family Medicine

## 2024-11-11 VITALS — BP 157/96 | HR 75 | Temp 98.2°F | Ht 63.0 in | Wt 145.2 lb

## 2024-11-11 DIAGNOSIS — R918 Other nonspecific abnormal finding of lung field: Secondary | ICD-10-CM

## 2024-11-11 DIAGNOSIS — J181 Lobar pneumonia, unspecified organism: Secondary | ICD-10-CM

## 2024-11-11 DIAGNOSIS — G9331 Postviral fatigue syndrome: Secondary | ICD-10-CM | POA: Diagnosis not present

## 2024-11-11 NOTE — Progress Notes (Signed)
 Subjective:   PATIENT ID: [name] GENDER: female DOB: [birth year], MRN: [id number]  Chief Complaint  Patient presents with   Consult    Long covid     Reason for Visit: New consult for long covid hauler     [name] is a 78 y.o. female with OSA on oral appliance, SSS s/p PM, HTN, HLD, hiatal hernia, GERD and depression who presents for evaluation for long COVID hauler.     Social History: Never smoker Second hand smoke exposure  Environmental exposures:      11/11/2024 Discussed the use of AI scribe software for clinical note transcription with the patient, who gave verbal consent to proceed.  History of Present Illness [name] is a 78 year old female who presents with a lung nodule and fatigue. She was referred by Dr. Cleatus for evaluation of long COVID and a lung nodule.  A lung nodule was discovered in her right lung during a coronary CT scan performed by Dr. Verlin, her cardiologist. This nodule was not present in a CT scan from the previous year. A PET scan was conducted, and the nodule did not show increased metabolic activity.  She experiences significant fatigue that began almost two years ago following a COVID-19 infection in March 2024. Despite taking antiviral medication, she never fully regained her energy. She has undergone two sleep studies, one in April 2025 and another in July 2025, which did not confirm sleep apnea. She has a history of sleep apnea managed with an oral appliance, which she has not been using consistently due to feeling unwell.  She has a history of bradycardia and has a pacemaker. A CT scan last year revealed a small aortic aneurysm and fluid around the heart, which has since resolved. She denies shortness of breath, cough, wheezing, chest pain, and palpitations. Denies weight loss, fevers, chills.  She reports a weight gain of approximately 15-20 pounds, attributing it to decreased activity. No unexplained fevers or  chills. Her diet is limited, often consisting of simple meals like a peanut butter and jelly sandwich with milk.  She has hospitalization in August 2025 for hyponatremia. Post-hospitalization, she received in-home physical therapy, which was discontinued due to her fatigue and inability to participate consistently.     Past Medical History:  Diagnosis Date   Adenomatous colon polyp 12/2006   Allergic rhinitis    Anemia    Anxiety    Appendicitis 04/2020   Arthritis    Bradycardia    s/p MDT PPM   Cataract    bilateral   Clotting disorder September 26, 2017   Depression    Esophageal stricture    Family history of adverse reaction to anesthesia    maternal uncle with MH, nephew had MH during surgery and was OK, pt not been tested   GERD (gastroesophageal reflux disease)    Heart murmur    Hemorrhoids    Hyperlipidemia    Hypertension    Inguinal hernia    Osteoporosis    Presence of permanent cardiac pacemaker 2006   SSS   PVC (premature ventricular contraction)    Scleritis of right eye    Skin cancer    Basal and squamous cell   Sleep apnea    a. intolerant of CPAP, does wear a mouthpiece   Subdural hematoma (HCC)    a. spontanous 09/2017 b. resolved without intervention   Thyroid  disease      Family History  Problem Relation Age  of Onset   Heart failure Sister    Heart disease Sister    Hypertension Sister    Colon polyps Sister    Arthritis Sister    Depression Sister    Dementia Mother    Hypertension Mother    Colon polyps Mother    Anxiety disorder Mother    Hypertension Father    Arthritis Father    Hyperlipidemia Sister    Breast cancer Maternal Aunt        Age 55's   Diabetes Maternal Aunt    Diabetes Maternal Uncle    Ovarian cancer Maternal Aunt    Colon cancer Neg Hx    Esophageal cancer Neg Hx    Stomach cancer Neg Hx    Rectal cancer Neg Hx      Social History   Occupational History   Occupation: Retired    Associate Professor: UNEMPLOYED   Tobacco Use   Smoking status: Never   Smokeless tobacco: Never  Vaping Use   Vaping status: Never Used  Substance and Sexual Activity   Alcohol  use: No    Alcohol /week: 0.0 standard drinks of alcohol    Drug use: No   Sexual activity: Not Currently    Birth control/protection: Surgical, Post-menopausal    Comment: HYST-1st intercourse 78 yo-fewer than 5 partners    Allergies  Allergen Reactions   Cymbalta  [Duloxetine  Hcl]     Hyponatremia   Hydrocodone     Increased HR   Antihistamines, Loratadine-Type Other (See Comments)    Heart races/jittery   Cephalexin Nausea And Vomiting   Citalopram     intolerant   Codeine Other (See Comments)    Increased heart rate, anxiety   Demerol [Meperidine] Other (See Comments)    Increased heart rate, anxiety   Epinephrine  Other (See Comments)    Increased heart rate   Erythromycin Nausea And Vomiting   Hydrochlorothiazide      Low sodium   Hyoscyamine  Sulfate Other (See Comments)    Pt could not tolerate this med UNKNOWN reaction   Inderal [Propranolol] Other (See Comments)    Extreme tiredness   Lidocaine  Other (See Comments)    Family hx of malignant hyperthermia   Lipitor [Atorvastatin] Other (See Comments)    Muscle aches   Losartan Potassium Other (See Comments)    Pt could not tolerate this med UNKNOWN REACTION   Morphine Other (See Comments)    Nightmares, increased BP   Nebivolol Other (See Comments)    angioedema   Paxil [Paroxetine Hcl] Other (See Comments)    Intolerant, mood changes.    Penicillins Nausea And Vomiting   Prednisone Other (See Comments)    Tolerates low doses if absolutely necessary - makes her nervous, jittery   Propranolol Hcl Other (See Comments)    Pt could not tolerate this med UNKNOWN REACTION   Sertraline Hcl Other (See Comments)    Pt could not tolerate this med UNKNOWN REACTION   Tetracycline Nausea And Vomiting   Toprol  Xl [Metoprolol  Succinate] Other (See Comments)    Extreme  tiredness   Verapamil Other (See Comments)    Pt could not tolerate this med UNKNOWN REACTION   Hydralazine      headaches   Oxycodone -Acetaminophen  Palpitations    Causes accelerated heart rate     Outpatient Medications Prior to Visit  Medication Sig Dispense Refill   acetaminophen  (TYLENOL ) 500 MG tablet Take 1,000 mg by mouth every 6 (six) hours as needed (pain).     diltiazem  (CARDIZEM  CD)  120 MG 24 hr capsule Take 1 capsule (120 mg total) by mouth daily. Please call [phone] to schedule an appointment for future refills. Thank you. 90 capsule 0   famotidine  (PEPCID ) 20 MG tablet Take 1 tablet by mouth twice daily 180 tablet 1   LORazepam  (ATIVAN ) 1 MG tablet TAKE 1 TABLET BY MOUTH EVERY 6 HOURS AS NEEDED 120 tablet 2   Polyethyl Glycol-Propyl Glycol (SYSTANE OP) Place 1 drop into both eyes daily as needed (dry eyes).      rosuvastatin  (CRESTOR ) 10 MG tablet Take 0.5 tablets (5 mg total) by mouth daily.     diltiazem  (CARDIZEM ) 30 MG tablet Take 1 tablet (30 mg total) by mouth daily as needed. (Patient not taking: Reported on 11/11/2024) 30 tablet 3   omeprazole  (PRILOSEC) 20 MG capsule Take 1 capsule (20 mg total) by mouth daily. (Patient not taking: Reported on 11/11/2024) 90 capsule 3   No facility-administered medications prior to visit.    Review of Systems  Constitutional:  Positive for malaise/fatigue. Negative for chills, diaphoresis, fever and weight loss.  HENT:  Negative for congestion.   Respiratory:  Negative for cough, hemoptysis, sputum production, shortness of breath and wheezing.   Cardiovascular:  Negative for chest pain, palpitations and leg swelling.     Objective:   Vitals:   11/11/24 1359  BP: (!) 157/96  Pulse: 75  Temp: 98.2 F (36.8 C)  SpO2: 97%  Weight: 145 lb 3.2 oz (65.9 kg)  Height: 5' 3 (1.6 m)   SpO2: 97 %  Physical Exam: General: Well-appearing, no acute distress HENT: [place], AT Eyes: EOMI, no scleral icterus Respiratory: Clear  to auscultation bilaterally.  No crackles, wheezing or rales Cardiovascular: RRR, -M/R/G, no JVD Extremities:-Edema,-tenderness Neuro: AAO x4, CNII-XII grossly intact Psych: Normal mood, normal affect  Data Reviewed:  Imaging: PET/CT 11/09/24 - RLL pulmonary opacity not hypermetabolic 9x8 mm. Similar size compared to 10/13/24.   PFT: None on file  Labs: CBC    Component Value Date/Time   WBC 6.2 07/29/2024 0127   RBC 4.35 07/29/2024 0127   HGB 14.0 07/29/2024 0127   HCT 38.4 07/29/2024 0127   PLT 197 07/29/2024 0127   MCV 88.3 07/29/2024 0127   MCH 32.2 07/29/2024 0127   MCHC 36.5 (H) 07/29/2024 0127   RDW 12.1 07/29/2024 0127   LYMPHSABS 1.2 07/29/2024 0127   MONOABS 1.0 07/29/2024 0127   EOSABS 0.1 07/29/2024 0127   BASOSABS 0.1 07/29/2024 0127    Sleep: 06/02/24 Split night AHI 2.7 - no evidence of OSA     Assessment & Plan:   Discussion: We reviewed imaging for RLL nodule/consolidation. Negative PET which is less likely for malignancy. No diagnostic intervention recommended. Recommend ongoing surveillance to ensure stability.  Regarding post-viral fatigue complicated by severe deconditioning and recent hospitalization in 07/2024 that further contributed to this. Prior academic programs that supported covid long hauler no longer available. Recommended conservative management with focus on optimizing exercise and nutrition regimen. We reviewed clinical course of post viral fatigue syndrome if we do not improve.    Assessment & Plan Right lower lobe consolidation Incidentally found on CTA for abdominal aneurysm where 1.4 cm RLL nodule. --Reviewed PET 11/09/24 RLL 9 x8 mm not hypermetabolic. Will continue surveillance based on size --ORDER CT Chest without contrast in 6 months (June 2026) Postviral fatigue syndrome Multiple sleep studies. Last one 06/02/24 with no sleep apnea. Echocardiogram 11/2023 with normal EF, grade I DD, no WMA. RV size moderately enlarged.  Hospitalized in August for hyponatremia. Has completed home PT Sept to Nov 2025. Suspect severe deconditioning related to decreased activity since then --Refer to [facility] physical therapy --Once graduated, will plan to refer to Pulmonary rehab --Consider discussion of nutrition next visit  Health Maintenance Immunization History  Administered Date(s) Administered   Fluad Quad(high Dose 65+) 09/03/2019, 09/01/2020, 08/30/2022   Fluad Trivalent(High Dose 65+) 09/26/2023   Influenza Split 08/21/2012   Influenza,inj,Quad PF,6+ Mos 09/11/2013, 08/25/2014, 09/05/2015, 09/03/2016, 09/03/2017, 08/28/2018   Influenza-Unspecified 09/20/2021   PFIZER(Purple Top)SARS-COV-2 Vaccination 01/01/2020, 01/23/2020, 09/26/2020   Pfizer Covid-19 Vaccine Bivalent Booster 59yrs & up 10/09/2021   Pfizer(Comirnaty)Fall Seasonal Vaccine 12 years and older 02/26/2023   Pneumococcal Conjugate-13 09/05/2015   Pneumococcal Polysaccharide-23 10/11/2014   Tdap 07/04/2007, 07/31/2018   Zoster, Live 06/04/2012   CT Lung Screen - not qualified  Orders Placed This Encounter  Procedures   CT Chest Wo Contrast    Standing Status:   Future    Expiration Date:   11/11/2025    Scheduling Instructions:     Schedule in June 2026    Preferred imaging location?:   [facility]   Ambulatory referral to Physical Therapy    Referral Priority:   Routine    Referral Type:   Physical Medicine    Referral Reason:   Specialty Services Required    Requested Specialty:   Physical Therapy    Number of Visits Requested:   1  No orders of the defined types were placed in this encounter.   Return in about 3 months (around 02/09/2025).  I have spent a total time of 45-minutes on the day of the appointment reviewing prior documentation, coordinating care and discussing medical diagnosis and plan with the patient/family. Imaging, labs and tests included in this note have been reviewed and interpreted independently by me. This note  is generated using [employer] programming. Patient/family has given consent.  Zariana Strub Slater Staff, MD [facility] 11/11/2024 4:59 PM

## 2024-11-11 NOTE — Progress Notes (Signed)
 Remote PPM Transmission

## 2024-11-11 NOTE — Assessment & Plan Note (Signed)
 Multiple sleep studies. Last one 06/02/24 with no sleep apnea. Echocardiogram 11/2023 with normal EF, grade I DD, no WMA. RV size moderately enlarged. Hospitalized in August for hyponatremia. Has completed home PT Sept to Nov 2025. Suspect severe deconditioning related to decreased activity since then --Refer to [facility] physical therapy --Once graduated, will plan to refer to Pulmonary rehab --Consider discussion of nutrition next visit

## 2024-11-11 NOTE — Patient Instructions (Signed)
 Right lower lobe consolidation Incidentally found on CTA for abdominal aneurysm where 1.4 cm RLL nodule. --Reviewed PET 11/09/24 RLL 9 x8 mm not hypermetabolic. Will continue surveillance based on size --ORDER CT Chest without contrast in 6 months (June 2026)  Postviral fatigue syndrome --Refer to [facility] physical therapy --Once graduated, will plan to refer to Pulmonary rehab

## 2024-11-13 ENCOUNTER — Other Ambulatory Visit: Payer: Self-pay | Admitting: Cardiovascular Disease

## 2024-11-13 DIAGNOSIS — I119 Hypertensive heart disease without heart failure: Secondary | ICD-10-CM

## 2024-11-16 ENCOUNTER — Encounter: Payer: Self-pay | Admitting: Cardiovascular Disease

## 2024-11-19 ENCOUNTER — Ambulatory Visit (INDEPENDENT_AMBULATORY_CARE_PROVIDER_SITE_OTHER): Admitting: Family Medicine

## 2024-11-19 VITALS — BP 142/90 | HR 68 | Temp 98.8°F | Ht 63.0 in | Wt 144.1 lb

## 2024-11-19 DIAGNOSIS — Z23 Encounter for immunization: Secondary | ICD-10-CM

## 2024-11-19 DIAGNOSIS — R5383 Other fatigue: Secondary | ICD-10-CM

## 2024-11-19 NOTE — Patient Instructions (Signed)
 Flu shot today.  Try protein powder added to milkshakes, etc.  I'll await the PT notes.  Take care.  Glad to see you.

## 2024-11-19 NOTE — Progress Notes (Unsigned)
 D/w pt about PET scan.  F/u scan ordered per pulmonary.  I will defer.  Discussed with patient.  She will have pulmonary follow-up.  On pepcid , off omeprazole . Not having GERD sx.   She still isn't sleeping well at baseline.  She has nightmares with some variable recall of those.  She has bad dreams as a child.   We talked about PT and then trying to graduate to pulmonary rehab or similar.  Discussed trying to treat her deconditioning to try to help with fatigue.  Diet discussed, specifically by protein supplements.  See plan.  Meds, vitals, and allergies reviewed.   ROS: Per HPI unless specifically indicated in ROS section   Nad Ncat Fatigued appearing but not in distress. Neck supple, no LA Rrr Ctab Abd soft, normal bowel sounds. Skin well-perfused. No BLE edema.   Flu shot done at OV.  We talked about the rationale to get that done today.

## 2024-11-20 NOTE — Therapy (Signed)
 " OUTPATIENT PHYSICAL THERAPY NEURO EVALUATION   Patient Name: [name] MRN: [id number] DOB:December 19, 1945, 78 y.o., female Today's Date: 11/20/2024   PCP: Cleatus Arlyss RAMAN, MD  REFERRING PROVIDER: Kassie Acquanetta Bradley, MD  END OF SESSION:   Past Medical History:  Diagnosis Date   Adenomatous colon polyp 12/2006   Allergic rhinitis    Anemia    Anxiety    Appendicitis 04/2020   Arthritis    Bradycardia    s/p MDT PPM   Cataract    bilateral   Clotting disorder September 26, 2017   Depression    Esophageal stricture    Family history of adverse reaction to anesthesia    maternal uncle with MH, nephew had MH during surgery and was OK, pt not been tested   GERD (gastroesophageal reflux disease)    Heart murmur    Hemorrhoids    Hyperlipidemia    Hypertension    Inguinal hernia    Osteoporosis    Presence of permanent cardiac pacemaker 2006   SSS   PVC (premature ventricular contraction)    Scleritis of right eye    Skin cancer    Basal and squamous cell   Sleep apnea    a. intolerant of CPAP, does wear a mouthpiece   Subdural hematoma (HCC)    a. spontanous 09/2017 b. resolved without intervention   Thyroid  disease    Past Surgical History:  Procedure Laterality Date   ABDOMINAL HYSTERECTOMY     APPENDECTOMY     BREAST BIOPSY Left 01/15/2014   BREAST BIOPSY Right 06/25/2023   US  RT BREAST BX W LOC DEV 1ST LESION IMG BX SPEC US  GUIDE 06/25/2023 [facility]AMMO[facility]AP[facility]   BREAST SURGERY     Biopsy-benign   CESAREAN SECTION     COLONOSCOPY  2018   DILATION AND CURETTAGE OF UTERUS     ESOPHAGOGASTRODUODENOSCOPY     esophageal stretching   EYE SURGERY     HAND SURGERY  1992   HERNIA REPAIR     INGUINAL HERNIA REPAIR  02/12/1994   left inguinal hernia repair   JOINT REPLACEMENT  12/21/2014   KNEE ARTHROSCOPY  2011   LAPAROSCOPIC APPENDECTOMY N/A 11/29/2020   Procedure: APPENDECTOMY LAPAROSCOPIC;  Surgeon: Paola Dreama SAILOR, MD;  Location: [facility];   Service: General;  Laterality: N/A;   OOPHORECTOMY  1995   LSO and RSO   PACEMAKER GENERATOR CHANGE N/A 01/21/2015   Procedure: PACEMAKER GENERATOR CHANGE;  Surgeon: Lynwood Rakers, MD;  Location: [facility] CATH LAB;  Service: Cardiovascular;  Laterality: N/A;   PACEMAKER INSERTION  2006   Dr. Ellin   POLYPECTOMY     Tear duct surgery  2010   THUMB SURGERY     THYROIDECTOMY  1979   RIGHT SIDE   TOTAL KNEE ARTHROPLASTY Right 12/20/2014   Procedure: RIGHT TOTAL KNEE ARTHROPLASTY;  Surgeon: Marcey Raman, MD;  Location: [facility] OR;  Service: Orthopedics;  Laterality: Right;   UPPER GASTROINTESTINAL ENDOSCOPY     unsure   Patient Active Problem List   Diagnosis Date Noted   Muscular deconditioning 10/11/2024   Physical deconditioning 09/14/2024   Trochanteric bursitis 08/26/2024   Hyponatremia 07/28/2024   Reflux esophagitis 05/21/2024   Paresthesia 09/09/2022   Tinnitus 04/24/2022   Vitamin D  deficiency 09/10/2021   Essential hypertension 06/11/2018   History of thyroid  disease 05/10/2016   Osteoarthritis of neck 05/10/2016   S/P total knee arthroplasty 12/20/2014   Palpitations 09/23/2013   Fatigue 08/07/2013  Alopecia 05/11/2013   Benign hypertensive heart disease without heart failure 01/07/2013   Pacemaker-Medtronic 10/08/2012   Hiatal hernia 05/28/2012   Esophageal stricture 05/28/2012   Anxiety 01/16/2012   Sick sinus syndrome (HCC)    Osteoarthritis of knee 04/19/2011   Internal hemorrhoids 02/28/2011   Constipation 02/28/2011   GERD 06/28/2009   Hypercholesterolemia 03/30/2008   Obstructive sleep apnea 03/30/2008   History of SCC (squamous cell carcinoma) of skin 03/30/2008    ONSET DATE: ***  REFERRING DIAG: G93.31 (ICD-10-CM) - Postviral fatigue syndrome  THERAPY DIAG:  No diagnosis found.  Rationale for Evaluation and Treatment: Rehabilitation  SUBJECTIVE:                                                                                                                                                                                              SUBJECTIVE STATEMENT: *** Pt accompanied by: {accompnied:27141}  PERTINENT HISTORY: Pacemaker, Anemia, anxiety, bradycardia, clotting disorder, depression, HLD, HTN, osteoporosis, PVC, SDH 2018, R TKA  Per Dr. Kassie 11/11/24 significant fatigue that began almost two years ago following a COVID-19 infection in March 2024. She has hospitalization in August 2025 for hyponatremia. Post-hospitalization, she received in-home physical therapy, which was discontinued due to her fatigue and inability to participate consistently.  PAIN:  Are you having pain? {OPRCPAIN:27236}  PRECAUTIONS: {Therapy precautions:24002}  RED FLAGS: {PT Red Flags:29287}   WEIGHT BEARING RESTRICTIONS: {Yes ***/No:24003}  FALLS: Has patient fallen in last 6 months? {fallsyesno:27318}  LIVING ENVIRONMENT: Lives with: {OPRC lives with:25569::lives with their family} Lives in: {Lives in:25570} Stairs: {opstairs:27293} Has following equipment at home: {Assistive devices:23999}  PLOF: {PLOF:24004}  PATIENT GOALS: ***  OBJECTIVE:  Note: Objective measures were completed at Evaluation unless otherwise noted.  DIAGNOSTIC FINDINGS: none recent  COGNITION: Overall cognitive status: {cognition:24006}   SENSATION: {sensation:27233}  POSTURE: {posture:25561}  LOWER EXTREMITY ROM:     Active  Right Eval Left Eval  Hip flexion    Hip extension    Hip abduction    Hip adduction    Hip internal rotation    Hip external rotation    Knee flexion    Knee extension    Ankle dorsiflexion    Ankle plantarflexion    Ankle inversion    Ankle eversion     (Blank rows = not tested)  LOWER EXTREMITY MMT:    MMT Right Eval Left Eval  Hip flexion    Hip extension    Hip abduction    Hip adduction    Hip internal rotation    Hip external rotation    Knee flexion    Knee extension  Ankle dorsiflexion    Ankle  plantarflexion    Ankle inversion    Ankle eversion    (Blank rows = not tested)  GAIT: Findings: Assistive device utilized:{Assistive devices:23999}, Level of assistance: {Levels of assistance:24026}, and Comments: ***  FUNCTIONAL TESTS:  {Functional tests:24029}  PATIENT SURVEYS:  {rehab surveys:24030}                                                                                                                              TREATMENT DATE: ***    PATIENT EDUCATION: Education details: *** Person educated: {Person educated:25204} Education method: {Education Method:25205} Education comprehension: {Education Comprehension:25206}  HOME EXERCISE PROGRAM: ***  GOALS: Goals reviewed with patient? Yes  SHORT TERM GOALS: Target date: {follow up:25551}  Patient to be independent with initial HEP. Baseline: HEP initiated Goal status: {GOALSTATUS:25110}    LONG TERM GOALS: Target date: {follow up:25551}  Patient to be independent with advanced HEP. Baseline: Not yet initiated  Goal status: {GOALSTATUS:25110}  Patient to demonstrate B LE strength >/=4+/5.  Baseline: See above Goal status: {GOALSTATUS:25110}  Patient to demonstrate *** ROM WFL and without pain limiting.  Baseline: *** Goal status: {GOALSTATUS:25110}  Patient to report and demonstrate improved head, neck, and shoulder posture at rest and with activity.  Baseline: *** Goal status: {GOALSTATUS:25110}  Patient to demonstrate alternating reciprocal pattern when ascending and descending stairs with good stability and 1 handrail as needed.   Baseline: Unable Goal status: {GOALSTATUS:25110}  Patient to score at least 20/24 on DGI in order to decrease risk of falls.  Baseline: *** Goal status: {GOALSTATUS:25110}  Patient to complete TUG in <14 sec with LRAD in order to decrease risk of falls.   Baseline: *** Goal status: {GOALSTATUS:25110}  Patient to demonstrate 5xSTS test in <15 sec in order to  decrease risk of falls.  Baseline: *** Goal status: {GOALSTATUS:25110}  Patient to score at least ***/56 on Berg in order to decrease risk of falls.  Baseline: *** Goal status: {GOALSTATUS:25110}  Patient to score at least *** on FOTO in order to indicate improved functional outcomes.  Baseline: *** Goal status: {GOALSTATUS:25110}  ASSESSMENT:  CLINICAL IMPRESSION:  Patient is a 78 y/o F presenting to OPPT with c/o *** for the past ***  Patient today presenting with ***.    Patient was educated on gentle *** HEP and reported understanding. Prior to current episode, patient was independent. Would benefit from skilled PT services *** x/week for *** weeks to address aforementioned impairments in order to optimize level of function.    OBJECTIVE IMPAIRMENTS: {opptimpairments:25111}.   ACTIVITY LIMITATIONS: {activitylimitations:27494}  PARTICIPATION LIMITATIONS: {participationrestrictions:25113}  PERSONAL FACTORS: {Personal factors:25162} are also affecting patient's functional outcome.   REHAB POTENTIAL: {rehabpotential:25112}  CLINICAL DECISION MAKING: {clinical decision making:25114}  EVALUATION COMPLEXITY: {Evaluation complexity:25115}  PLAN:  PT FREQUENCY: {rehab frequency:25116}  PT DURATION: {rehab duration:25117}  PLANNED INTERVENTIONS: {rehab planned interventions:25118::97110-Therapeutic exercises,97530- Therapeutic [id number]- Neuromuscular re-education,97535- Self Rjmz,02859- Manual therapy,Patient/Family education}  PLAN  FOR NEXT SESSION: [name], PT 11/20/2024, 12:09 PM        "

## 2024-11-23 ENCOUNTER — Encounter: Payer: Self-pay | Admitting: Physical Therapy

## 2024-11-23 ENCOUNTER — Ambulatory Visit: Attending: Pulmonary Disease | Admitting: Physical Therapy

## 2024-11-23 ENCOUNTER — Other Ambulatory Visit: Payer: Self-pay

## 2024-11-23 DIAGNOSIS — G9331 Postviral fatigue syndrome: Secondary | ICD-10-CM | POA: Diagnosis not present

## 2024-11-23 DIAGNOSIS — R2681 Unsteadiness on feet: Secondary | ICD-10-CM | POA: Insufficient documentation

## 2024-11-23 DIAGNOSIS — M6281 Muscle weakness (generalized): Secondary | ICD-10-CM | POA: Diagnosis present

## 2024-11-23 DIAGNOSIS — R262 Difficulty in walking, not elsewhere classified: Secondary | ICD-10-CM | POA: Diagnosis present

## 2024-11-24 NOTE — Assessment & Plan Note (Signed)
 I think would be okay to defer rechecking her sodium level today. Flu shot today.  Reasonable to try protein powder added to milkshakes, etc.  I'll await the PT notes.

## 2024-12-02 ENCOUNTER — Other Ambulatory Visit: Payer: Self-pay | Admitting: Family Medicine

## 2024-12-02 NOTE — Telephone Encounter (Signed)
 Requesting: Lorazepam  (Ativan ) 1 mg tablet Contract: No UDS: 07/27/24 Last Visit: 11/19/2024 Next Visit: Visit date not found Last Refill: 08/21/24  Please Advise

## 2024-12-04 NOTE — Therapy (Signed)
 " OUTPATIENT PHYSICAL THERAPY NEURO TREATMENT   Patient Name: [name] MRN: [id number] DOB:31-Oct-1946, 79 y.o., female Today's Date: 12/07/2024   PCP: Cleatus Arlyss RAMAN, MD  REFERRING PROVIDER: Kassie Acquanetta Bradley, MD  END OF SESSION:  PT End of Session - 12/07/24 1446     Visit Number 2    Number of Visits 17    Date for Recertification  01/25/25    Authorization Type [employer] Medicare    Authorization Time Period approved 17 PT visits from 11/23/24-01/25/25    Authorization - Visit Number 2    Authorization - Number of Visits 17    PT Start Time 1402    PT Stop Time 1446    PT Time Calculation (min) 44 min    Equipment Utilized During Treatment Gait belt    Activity Tolerance Patient tolerated treatment well    Behavior During Therapy [facility] for tasks assessed/performed           Past Medical History:  Diagnosis Date   Adenomatous colon polyp 12/2006   Allergic rhinitis    Anemia    Anxiety    Appendicitis 04/2020   Arthritis    Bradycardia    s/p MDT PPM   Cataract    bilateral   Clotting disorder September 26, 2017   Depression    Esophageal stricture    Family history of adverse reaction to anesthesia    maternal uncle with MH, nephew had MH during surgery and was OK, pt not been tested   GERD (gastroesophageal reflux disease)    Heart murmur    Hemorrhoids    Hyperlipidemia    Hypertension    Inguinal hernia    Osteoporosis    Presence of permanent cardiac pacemaker 2006   SSS   PVC (premature ventricular contraction)    Scleritis of right eye    Skin cancer    Basal and squamous cell   Sleep apnea    a. intolerant of CPAP, does wear a mouthpiece   Subdural hematoma (HCC)    a. spontanous 09/2017 b. resolved without intervention   Thyroid  disease    Past Surgical History:  Procedure Laterality Date   ABDOMINAL HYSTERECTOMY     APPENDECTOMY     BREAST BIOPSY Left 01/15/2014   BREAST BIOPSY Right 06/25/2023   US  RT BREAST BX W LOC DEV 1ST LESION  IMG BX SPEC US  GUIDE 06/25/2023 [facility]AMMOGRAPHY   BREAST SURGERY     Biopsy-benign   CESAREAN SECTION     COLONOSCOPY  2018   DILATION AND CURETTAGE OF UTERUS     ESOPHAGOGASTRODUODENOSCOPY     esophageal stretching   EYE SURGERY     HAND SURGERY  1992   HERNIA REPAIR     INGUINAL HERNIA REPAIR  02/12/1994   left inguinal hernia repair   JOINT REPLACEMENT  12/21/2014   KNEE ARTHROSCOPY  2011   LAPAROSCOPIC APPENDECTOMY N/A 11/29/2020   Procedure: APPENDECTOMY LAPAROSCOPIC;  Surgeon: Paola Dreama SAILOR, MD;  Location: [facility];  Service: General;  Laterality: N/A;   OOPHORECTOMY  1995   LSO and RSO   PACEMAKER GENERATOR CHANGE N/A 01/21/2015   Procedure: PACEMAKER GENERATOR CHANGE;  Surgeon: Lynwood Rakers, MD;  Location[facility] CATH LAB;  Service: Cardiovascular;  Laterality: N/A;   PACEMAKER INSERTION  2006   Dr. Ellin   POLYPECTOMY     Tear duct surgery  2010   THUMB SURGERY     THYROIDECTOMY  1979  RIGHT SIDE   TOTAL KNEE ARTHROPLASTY Right 12/20/2014   Procedure: RIGHT TOTAL KNEE ARTHROPLASTY;  Surgeon: Marcey Raman, MD;  Location: [facility] OR;  Service: Orthopedics;  Laterality: Right;   UPPER GASTROINTESTINAL ENDOSCOPY     unsure   Patient Active Problem List   Diagnosis Date Noted   Muscular deconditioning 10/11/2024   Physical deconditioning 09/14/2024   Trochanteric bursitis 08/26/2024   Hyponatremia 07/28/2024   Reflux esophagitis 05/21/2024   Paresthesia 09/09/2022   Tinnitus 04/24/2022   Vitamin D  deficiency 09/10/2021   Essential hypertension 06/11/2018   History of thyroid  disease 05/10/2016   Osteoarthritis of neck 05/10/2016   S/P total knee arthroplasty 12/20/2014   Palpitations 09/23/2013   Fatigue 08/07/2013   Alopecia 05/11/2013   Benign hypertensive heart disease without heart failure 01/07/2013   Pacemaker-Medtronic 10/08/2012   Hiatal hernia 05/28/2012   Esophageal stricture 05/28/2012   Anxiety 01/16/2012   Sick sinus syndrome (HCC)     Osteoarthritis of knee 04/19/2011   Internal hemorrhoids 02/28/2011   Constipation 02/28/2011   GERD 06/28/2009   Hypercholesterolemia 03/30/2008   Obstructive sleep apnea 03/30/2008   History of SCC (squamous cell carcinoma) of skin 03/30/2008    ONSET DATE: 2 years ago  REFERRING DIAG: G93.31 (ICD-10-CM) - Postviral fatigue syndrome  THERAPY DIAG:  Muscle weakness (generalized)  Difficulty in walking, not elsewhere classified  Unsteadiness on feet  Rationale for Evaluation and Treatment: Rehabilitation  SUBJECTIVE:                                                                                                                                                                                             SUBJECTIVE STATEMENT: Nothing new.    Pt accompanied by: self and significant other- assist with subjective   PERTINENT HISTORY: Pacemaker, Anemia, anxiety, bradycardia, clotting disorder, depression, HLD, HTN, osteoporosis, PVC, SDH 2018, R TKA  Per Dr. Kassie 11/11/24 significant fatigue that began almost two years ago following a COVID-19 infection in March 2024. She has hospitalization in August 2025 for hyponatremia. Post-hospitalization, she received in-home physical therapy, which was discontinued due to her fatigue and inability to participate consistently.  PAIN:  Are you having pain? No  PRECAUTIONS: Fall, osteoporosis  RED FLAGS: None   WEIGHT BEARING RESTRICTIONS: No  FALLS: Has patient fallen in last 6 months? Yes. Number of falls 2  LIVING ENVIRONMENT: Lives with: lives with their spouse Lives in: House/apartment Stairs: 6 steps with handrails; bedroom on 1st floor   Has following equipment at home: Single point cane and Shower bench  PLOF: Independent, Vocation/Vocational requirements: retired, and Leisure: used to enjoy going for  walks but no longer doing this  PATIENT GOALS: improve endurance   OBJECTIVE:      TODAY'S TREATMENT:  12/07/24 Activity Comments  Vitals at start of session 136/95 mmHg, 97% spO2, 89bpm   32M walk test Pre:136/95 mmHg, 97% spO2, 89bpm  RPE 9/20   Post:167/98 mmHg, 97% spO2, 96bpm  RPE 13/20  Patient completed 974 ft (297 meters)  12.28 sec (2.67 ft/sec)  30 sec intervals: standing march STS sidestepping  standing PWR up 30 sec rest break in between with focus on rhythmic breathing. Patient required cues to maintain hips/shoulders square with sidesteps but appeared safe throughout            HOME EXERCISE PROGRAM Last updated: 12/07/24 Access Code: [email or web address] URL: [email or web address] Date: 12/07/2024 Prepared by: [facility] - Outpatient  Rehab - [facility]  Program Notes perform 30 sec of the exercise, then 30 sec rest break.  Exercises - Standing Marching  - 1 x daily - 5 x weekly - 2-3 sets - 30 sec hold - Sit to Stand with Arms Crossed  - 1 x daily - 5 x weekly - 2-3 sets - 30 sec hold - Side Stepping with Counter Support  - 1 x daily - 5 x weekly - 2-3 sets - 30 sec hold - Mini Squat + Reach  - 1 x daily - 5 x weekly - 2-3 sets - 30 sec hold   PATIENT EDUCATION: Education details: edu on exam findings and new goals, HEP with edu for safety  Person educated: Patient and Spouse Education method: Explanation, Demonstration, Tactile cues, Verbal cues, and Handouts Education comprehension: verbalized understanding and returned demonstration     Note: Objective measures were completed at Evaluation unless otherwise noted.  DIAGNOSTIC FINDINGS: none recent  VITALS: 96% spO2, 83bpm, 146/104 mmHg,  COGNITION: Overall cognitive status: Within functional limits for tasks assessed   SENSATION: Pt denies N/T in UEs/LEs   POSTURE: rounded shoulders and forward head   LOWER EXTREMITY MMT:    MMT (in sitting) Right Eval Left Eval  Hip flexion 4 4+  Hip extension    Hip abduction 4+ 4+  Hip adduction 4+ 4+  Hip internal rotation    Hip  external rotation    Knee flexion 4 4  Knee extension 4 4  Ankle dorsiflexion 4+ 4+  Ankle plantarflexion 4 4-  Ankle inversion    Ankle eversion    (Blank rows = not tested)     GAIT: Findings: Assistive device utilized:None, Level of assistance: SBA, and Comments: slowed, reduced B foot clearance and mild imbalance    FUNCTIONAL TESTS:  5 times sit to stand: 12.5 sec pushing off knees     [facility] PT Assessment - 11/23/24 0001       Standardized Balance Assessment   Standardized Balance Assessment Dynamic Gait Index      Dynamic Gait Index   Level Surface Mild Impairment    Change in Gait Speed Moderate Impairment    Gait with Horizontal Head Turns Mild Impairment    Gait with Vertical Head Turns Mild Impairment    Gait and Pivot Turn Mild Impairment    Step Over Obstacle Normal    Step Around Obstacles Moderate Impairment    Steps Mild Impairment    Total Score 15            Vitals after activity: 97% spO2, 84 bpm  TREATMENT DATE: 11/23/24    PATIENT EDUCATION: Education details: advised pt to monitor BP at home and message PCP if it continues to stay high, prognosis, POC, edu on exam findings and how they relate to functional impairments  Person educated: Patient and Spouse Education method: Explanation Education comprehension: verbalized understanding  HOME EXERCISE PROGRAM: Not yet initiated   GOALS: Goals reviewed with patient? Yes  SHORT TERM GOALS: Target date: 12/21/2024  Patient to be independent with initial HEP. Baseline: HEP initiated Goal status: IN PROGRESS    LONG TERM GOALS: Target date: 01/25/25  Patient to be independent with advanced HEP. Baseline: Not yet initiated  Goal status: IN PROGRESS  Patient to demonstrate B LE strength >/=4+/5.  Baseline: See above Goal status: IN PROGRESS  Patient to  report tolerance for standing/walking in the kitchen to complete chores without fatigue limiting.  Baseline: not performing chores Goal status: IN PROGRESS  Patient to return to walking or recumbent bike program 1-2x/week in order to maintain fitness.  Baseline: not performing.  Goal status: IN PROGRESS  Patient to score at least 20/24 on DGI in order to decrease risk of falls.  Baseline: 15 Goal status: IN PROGRESS  Patient to complete at least 1545 feet during 6 minute walk test in order to meet age-matches norms.  Baseline: 974 feet 12/07/24  Goal status: IN PROGRESS 12/07/24   Patient to demonstrate gait speed of at least 2.9 ft/sec in order to improve access to community.  Baseline: 2.67 ft/sec 12/08/23  Goal status: IN PROGRESS 12/08/23    ASSESSMENT:  CLINICAL IMPRESSION: Patient arrived to session without new complaints. Session focused on walking endurance assessment. Patient was able to ambulate 974 ft during . Good appropriate vitals response to exercise observed. Developed HEP focusing on AMRAP within short periods- patient tolerated this well today. Patient tolerated session well and without complaints at end of appointment.   OBJECTIVE IMPAIRMENTS: Abnormal gait, decreased activity tolerance, decreased balance, difficulty walking, decreased strength, and postural dysfunction.   ACTIVITY LIMITATIONS: carrying, lifting, bending, standing, squatting, sleeping, stairs, transfers, bathing, dressing, hygiene/grooming, and locomotion level  PARTICIPATION LIMITATIONS: meal prep, cleaning, laundry, shopping, community activity, yard work, and church  PERSONAL FACTORS: Age, Past/current experiences, Time since onset of injury/illness/exacerbation, and 3+ comorbidities: Pacemaker, Anemia, anxiety, bradycardia, clotting disorder, depression, HLD, HTN, osteoporosis, PVC, SDH 2018, R TKA are also affecting patient's functional outcome.   REHAB POTENTIAL: Good  CLINICAL DECISION  MAKING: Evolving/moderate complexity  EVALUATION COMPLEXITY: Moderate  PLAN:  PT FREQUENCY: 1-2x/week  PT DURATION: 8 weeks  PLANNED INTERVENTIONS: 97164- PT Re-evaluation, 97110-Therapeutic exercises, 97530- Therapeutic activity, 97112- Neuromuscular re-education, 97535- Self Care, 02859- Manual therapy, [phone]- Gait training, [phone]- Canalith repositioning, [id number]- Aquatic Therapy, [phone] (1-2 muscles), 20561 (3+ muscles)- Dry Needling, Patient/Family education, Balance training, Stair training, Taping, Joint mobilization, Spinal mobilization, Vestibular training, Cryotherapy, and Moist heat  PLAN FOR NEXT SESSION: monitor BP, review HEP; work on endurance or stamina     Louana Terrilyn Christians, PT, DPT 12/07/2024 2:47 PM  [facility] [address], [address] [place], [place] [address] Phone # [phone] Fax # [phone]     "

## 2024-12-07 ENCOUNTER — Other Ambulatory Visit: Payer: Self-pay | Admitting: Family Medicine

## 2024-12-07 ENCOUNTER — Ambulatory Visit: Attending: Pulmonary Disease | Admitting: Physical Therapy

## 2024-12-07 ENCOUNTER — Encounter: Payer: Self-pay | Admitting: Physical Therapy

## 2024-12-07 DIAGNOSIS — M6281 Muscle weakness (generalized): Secondary | ICD-10-CM | POA: Diagnosis present

## 2024-12-07 DIAGNOSIS — R2681 Unsteadiness on feet: Secondary | ICD-10-CM | POA: Insufficient documentation

## 2024-12-07 DIAGNOSIS — R262 Difficulty in walking, not elsewhere classified: Secondary | ICD-10-CM | POA: Diagnosis present

## 2024-12-08 ENCOUNTER — Telehealth: Payer: Self-pay

## 2024-12-08 NOTE — Telephone Encounter (Signed)
 Spoke w/ patient regarding PVC's and manual transmission. Transmission received and reviewed. Presenting Rhythm AS/VS. PVC's not currently noted on presenting, but PVC burden remains elevated. No episodes noted. Patient states she is currently not having any symptoms. States she is use to having PVC's. States she will notice intermittently when she feels like her heart is skipping beats. States medication compliance and Diltiazem  120 mg daily. Patient is prescribed Diltiazem  PRN 30mg  but has not ever taken additional dose. Informed patient she can take PRN dose of diltiazem  if symptoms persist. Advised patient to take PRN Diltiazem  if needed and to make a note if PRN medication helps. If medication does not help, informed to contact the device clinic back and also inform Dr. Verlin during upcoming OV. Patient verbalized understanding.   Patient has F/U appt w/ Dr. Verlin on 01/01/25 @ 4:20pm. Patient aware and verbalized understanding.   Informed to contact device clinic if symptoms persist or worsen. Patient appreciative for call. Will continue to monitor and update accordingly.

## 2024-12-08 NOTE — Telephone Encounter (Signed)
 Pt husband called in letting us  know pt is going to send in a transmission because she is having lots of PVC's and wants to know what they should do. Pt aware they will receive a call once transmission is received

## 2024-12-10 ENCOUNTER — Encounter: Payer: Self-pay | Admitting: Physical Therapy

## 2024-12-10 ENCOUNTER — Ambulatory Visit: Admitting: Physical Therapy

## 2024-12-10 DIAGNOSIS — R2681 Unsteadiness on feet: Secondary | ICD-10-CM

## 2024-12-10 DIAGNOSIS — M6281 Muscle weakness (generalized): Secondary | ICD-10-CM | POA: Diagnosis not present

## 2024-12-10 DIAGNOSIS — R262 Difficulty in walking, not elsewhere classified: Secondary | ICD-10-CM

## 2024-12-10 NOTE — Therapy (Signed)
 " OUTPATIENT PHYSICAL THERAPY NEURO TREATMENT   Patient Name: [name] MRN: [id number] DOB:1946/09/01, 79 y.o., female Today's Date: 12/10/2024   PCP: Cleatus Arlyss RAMAN, MD  REFERRING PROVIDER: Kassie Acquanetta Bradley, MD  END OF SESSION:  PT End of Session - 12/10/24 1448     Visit Number 3    Number of Visits 17    Date for Recertification  01/25/25    Authorization Type [employer]    Authorization Time Period approved 17 PT visits from 11/23/24-01/25/25    Authorization - Visit Number 3    Authorization - Number of Visits 17    Progress Note Due on Visit 10    PT Start Time 1449    PT Stop Time 1527    PT Time Calculation (min) 38 min    Equipment Utilized During Treatment Gait belt    Activity Tolerance Patient tolerated treatment well    Behavior During Therapy [facility] for tasks assessed/performed            Past Medical History:  Diagnosis Date   Adenomatous colon polyp 12/2006   Allergic rhinitis    Anemia    Anxiety    Appendicitis 04/2020   Arthritis    Bradycardia    s/p MDT PPM   Cataract    bilateral   Clotting disorder September 26, 2017   Depression    Esophageal stricture    Family history of adverse reaction to anesthesia    maternal uncle with MH, nephew had MH during surgery and was OK, pt not been tested   GERD (gastroesophageal reflux disease)    Heart murmur    Hemorrhoids    Hyperlipidemia    Hypertension    Inguinal hernia    Osteoporosis    Presence of permanent cardiac pacemaker 2006   SSS   PVC (premature ventricular contraction)    Scleritis of right eye    Skin cancer    Basal and squamous cell   Sleep apnea    a. intolerant of CPAP, does wear a mouthpiece   Subdural hematoma (HCC)    a. spontanous 09/2017 b. resolved without intervention   Thyroid  disease    Past Surgical History:  Procedure Laterality Date   ABDOMINAL HYSTERECTOMY     APPENDECTOMY     BREAST BIOPSY Left 01/15/2014   BREAST BIOPSY Right 06/25/2023   US   RT BREAST BX W LOC DEV 1ST LESION IMG BX SPEC US  GUIDE 06/25/2023 [facility]   BREAST SURGERY     Biopsy-benign   CESAREAN SECTION     COLONOSCOPY  2018   DILATION AND CURETTAGE OF UTERUS     ESOPHAGOGASTRODUODENOSCOPY     esophageal stretching   EYE SURGERY     HAND SURGERY  1992   HERNIA REPAIR     INGUINAL HERNIA REPAIR  02/12/1994   left inguinal hernia repair   JOINT REPLACEMENT  12/21/2014   KNEE ARTHROSCOPY  2011   LAPAROSCOPIC APPENDECTOMY N/A 11/29/2020   Procedure: APPENDECTOMY LAPAROSCOPIC;  Surgeon: Paola Dreama SAILOR, MD;  Location: [facility];  Service: General;  Laterality: N/A;   OOPHORECTOMY  1995   LSO and RSO   PACEMAKER GENERATOR CHANGE N/A 01/21/2015   Procedure: PACEMAKER GENERATOR CHANGE;  Surgeon: Lynwood Rakers, MD;  Location: [facility] C[facility] LAB;  Service: Cardiovascular;  Laterality: N/A;   PACEMAKER INSERTION  2006   Dr. Ellin   POLYPECTOMY     Tear duct surgery  2010   THUMB  SURGERY     THYROIDECTOMY  1979   RIGHT SIDE   TOTAL KNEE ARTHROPLASTY Right 12/20/2014   Procedure: RIGHT TOTAL KNEE ARTHROPLASTY;  Surgeon: Marcey Raman, MD;  Location: [facility] OR;  Service: Orthopedics;  Laterality: Right;   UPPER GASTROINTESTINAL ENDOSCOPY     unsure   Patient Active Problem List   Diagnosis Date Noted   Muscular deconditioning 10/11/2024   Physical deconditioning 09/14/2024   Trochanteric bursitis 08/26/2024   Hyponatremia 07/28/2024   Reflux esophagitis 05/21/2024   Paresthesia 09/09/2022   Tinnitus 04/24/2022   Vitamin D  deficiency 09/10/2021   Essential hypertension 06/11/2018   History of thyroid  disease 05/10/2016   Osteoarthritis of neck 05/10/2016   S/P total knee arthroplasty 12/20/2014   Palpitations 09/23/2013   Fatigue 08/07/2013   Alopecia 05/11/2013   Benign hypertensive heart disease without heart failure 01/07/2013   Pacemaker-[employer] 10/08/2012   Hiatal hernia 05/28/2012   Esophageal stricture 05/28/2012   Anxiety  01/16/2012   Sick sinus syndrome (HCC)    Osteoarthritis of knee 04/19/2011   Internal hemorrhoids 02/28/2011   Constipation 02/28/2011   GERD 06/28/2009   Hypercholesterolemia 03/30/2008   Obstructive sleep apnea 03/30/2008   History of SCC (squamous cell carcinoma) of skin 03/30/2008    ONSET DATE: 2 years ago  REFERRING DIAG: G93.31 (ICD-10-CM) - Postviral fatigue syndrome  THERAPY DIAG:  Muscle weakness (generalized)  Difficulty in walking, not elsewhere classified  Unsteadiness on feet  Rationale for Evaluation and Treatment: Rehabilitation  SUBJECTIVE:                                                                                                                                                                                             SUBJECTIVE STATEMENT: Nothing new.  Feeling the same.   Pt accompanied by: self and significant other- assist with subjective   PERTINENT HISTORY: Pacemaker, Anemia, anxiety, bradycardia, clotting disorder, depression, HLD, HTN, osteoporosis, PVC, SDH 2018, R TKA  Per Dr. Kassie 11/11/24 significant fatigue that began almost two years ago following a COVID-19 infection in March 2024. She has hospitalization in August 2025 for hyponatremia. Post-hospitalization, she received in-home physical therapy, which was discontinued due to her fatigue and inability to participate consistently.  PAIN:  Are you having pain? No  PRECAUTIONS: Fall, osteoporosis  RED FLAGS: None   WEIGHT BEARING RESTRICTIONS: No  FALLS: Has patient fallen in last 6 months? Yes. Number of falls 2  LIVING ENVIRONMENT: Lives with: lives with their spouse Lives in: House/apartment Stairs: 6 steps with handrails; bedroom on 1st floor   Has following equipment at home: Single point cane and Shower bench  PLOF: Independent, Vocation/Vocational requirements: retired, and Leisure: used to enjoy going for walks but no longer doing this  PATIENT GOALS: improve  endurance   OBJECTIVE:    TODAY'S TREATMENT: 12/10/2024 Activity Comments  Vitals beginning of session 141/98 HR 74 bpm  Review of HEP  Good return demo  30 sec intervals: Heel/toe raises Hamstring curls Alt step taps Backwards/forwards walking 2 sets; 30 sec rest breaks with rhythmic breathing in between each activity  Walking for exercise x 2 min Cues to do this at home at least 3x/day  Vitals  144/100 HR 77 bpm Pt asymptomatic (no headache, blurred vision           HOME EXERCISE PROGRAM Last updated: 12/07/24 Access Code: [email or web address] URL: [email or web address] Date: 12/07/2024 Prepared by: [facility] - Outpatient  Rehab - [facility]  Program Notes perform 30 sec of the exercise, then 30 sec rest break.  Exercises - Standing Marching  - 1 x daily - 5 x weekly - 2-3 sets - 30 sec hold - Sit to Stand with Arms Crossed  - 1 x daily - 5 x weekly - 2-3 sets - 30 sec hold - Side Stepping with Counter Support  - 1 x daily - 5 x weekly - 2-3 sets - 30 sec hold - Mini Squat + Reach  - 1 x daily - 5 x weekly - 2-3 sets - 30 sec hold   PATIENT EDUCATION: Education details: Requested pt check BP daily and report to MD if BP measures remain elevated.  Discussed walking program at home, 2-3 minutes, at least 3 times per day. Person educated: Patient and Spouse Education method: Explanation, Demonstration, Tactile cues, Verbal cues, and Handouts Education comprehension: verbalized understanding and returned demonstration     Note: Objective measures were completed at Evaluation unless otherwise noted.  DIAGNOSTIC FINDINGS: none recent  VITALS: 96% spO2, 83bpm, 146/104 mmHg,  COGNITION: Overall cognitive status: Within functional limits for tasks assessed   SENSATION: Pt denies N/T in UEs/LEs   POSTURE: rounded shoulders and forward head   LOWER EXTREMITY MMT:    MMT (in sitting) Right Eval Left Eval  Hip flexion 4 4+  Hip extension    Hip  abduction 4+ 4+  Hip adduction 4+ 4+  Hip internal rotation    Hip external rotation    Knee flexion 4 4  Knee extension 4 4  Ankle dorsiflexion 4+ 4+  Ankle plantarflexion 4 4-  Ankle inversion    Ankle eversion    (Blank rows = not tested)     GAIT: Findings: Assistive device utilized:None, Level of assistance: SBA, and Comments: slowed, reduced B foot clearance and mild imbalance    FUNCTIONAL TESTS:  5 times sit to stand: 12.5 sec pushing off knees     [facility] PT Assessment - 11/23/24 0001       Standardized Balance Assessment   Standardized Balance Assessment Dynamic Gait Index      Dynamic Gait Index   Level Surface Mild Impairment    Change in Gait Speed Moderate Impairment    Gait with Horizontal Head Turns Mild Impairment    Gait with Vertical Head Turns Mild Impairment    Gait and Pivot Turn Mild Impairment    Step Over Obstacle Normal    Step Around Obstacles Moderate Impairment    Steps Mild Impairment    Total Score 15            Vitals after activity: 97% spO2, 84 bpm  TREATMENT DATE: 11/23/24    PATIENT EDUCATION: Education details: advised pt to monitor BP at home and message PCP if it continues to stay high, prognosis, POC, edu on exam findings and how they relate to functional impairments  Person educated: Patient and Spouse Education method: Explanation Education comprehension: verbalized understanding  HOME EXERCISE PROGRAM: Not yet initiated   GOALS: Goals reviewed with patient? Yes  SHORT TERM GOALS: Target date: 12/21/2024  Patient to be independent with initial HEP. Baseline: HEP initiated Goal status: IN PROGRESS    LONG TERM GOALS: Target date: 01/25/25  Patient to be independent with advanced HEP. Baseline: Not yet initiated  Goal status: IN PROGRESS  Patient to demonstrate B LE strength  >/=4+/5.  Baseline: See above Goal status: IN PROGRESS  Patient to report tolerance for standing/walking in the kitchen to complete chores without fatigue limiting.  Baseline: not performing chores Goal status: IN PROGRESS  Patient to return to walking or recumbent bike program 1-2x/week in order to maintain fitness.  Baseline: not performing.  Goal status: IN PROGRESS  Patient to score at least 20/24 on DGI in order to decrease risk of falls.  Baseline: 15 Goal status: IN PROGRESS  Patient to complete at least 1545 feet during 6 minute walk test in order to meet age-matches norms.  Baseline: 974 feet 12/07/24  Goal status: IN PROGRESS 12/07/24   Patient to demonstrate gait speed of at least 2.9 ft/sec in order to improve access to community.  Baseline: 2.67 ft/sec 12/08/23  Goal status: IN PROGRESS 12/08/23    ASSESSMENT:  CLINICAL IMPRESSION: Pt presents today with no new complaints. Skilled PT session focused on reviewing HEP, which pt return demo understanding.  Worked on additional set of several standing exercises, with 30 sec work/30 sec rest with cues for breathing during rest.  Also worked on short distance gait, with education on ways to incorporate multiple bouts of walking throughout her day to help with improved endurance at home.  No complaints at end of appointment.  Pt will continue to benefit from skilled PT towards goals for improved functional mobility and decreased fall risk.  OBJECTIVE IMPAIRMENTS: Abnormal gait, decreased activity tolerance, decreased balance, difficulty walking, decreased strength, and postural dysfunction.   ACTIVITY LIMITATIONS: carrying, lifting, bending, standing, squatting, sleeping, stairs, transfers, bathing, dressing, hygiene/grooming, and locomotion level  PARTICIPATION LIMITATIONS: meal prep, cleaning, laundry, shopping, community activity, yard work, and church  PERSONAL FACTORS: Age, Past/current experiences, Time since onset of  injury/illness/exacerbation, and 3+ comorbidities: Pacemaker, Anemia, anxiety, bradycardia, clotting disorder, depression, HLD, HTN, osteoporosis, PVC, SDH 2018, R TKA are also affecting patient's functional outcome.   REHAB POTENTIAL: Good  CLINICAL DECISION MAKING: Evolving/moderate complexity  EVALUATION COMPLEXITY: Moderate  PLAN:  PT FREQUENCY: 1-2x/week  PT DURATION: 8 weeks  PLANNED INTERVENTIONS: 97164- PT Re-evaluation, 97110-Therapeutic exercises, 97530- Therapeutic activity, 97112- Neuromuscular re-education, 97535- Self Care, [id number]- Manual therapy, [phone]- Gait training, [phone]- Canalith repositioning, [id number]- Aquatic Therapy, [phone] (1-2 muscles), 20561 (3+ muscles)- Dry Needling, Patient/Family education, Balance training, Stair training, Taping, Joint mobilization, Spinal mobilization, Vestibular training, Cryotherapy, and Moist heat  PLAN FOR NEXT SESSION: monitor BP, review/progress HEP; work on endurance or stamina    Greig Anon, PT 12/10/2024 2:48 PM Phone: [phone] Fax: [phone]  [facility] at [facility] [address] [place], [place] [address] Phone # [phone] Fax # [phone]     "

## 2024-12-10 NOTE — Patient Instructions (Signed)
" °  BP measures pre-exercise:  141/98  BP measures post exercise:  144/100  Discussed that pt can check BP measures daily (and record) at home to let doctor know if the measures remain elevated. "

## 2024-12-14 ENCOUNTER — Encounter: Payer: Self-pay | Admitting: Cardiovascular Disease

## 2024-12-14 ENCOUNTER — Ambulatory Visit

## 2024-12-15 MED ORDER — DILTIAZEM HCL ER COATED BEADS 180 MG PO CP24: 180.0000 mg | ORAL_CAPSULE | Freq: Every day | ORAL | 2 refills | Status: AC

## 2024-12-16 ENCOUNTER — Ambulatory Visit: Admitting: Physical Therapy

## 2024-12-18 NOTE — Therapy (Signed)
 " OUTPATIENT PHYSICAL THERAPY NEURO TREATMENT   Patient Name: [name] MRN: [id number] DOB:23-Sep-1946, 79 y.o., female Today's Date: 12/21/2024   PCP: Cleatus Arlyss RAMAN, MD  REFERRING PROVIDER: Kassie Acquanetta Bradley, MD  END OF SESSION:  PT End of Session - 12/21/24 1442     Visit Number 4    Number of Visits 17    Date for Recertification  01/25/25    Authorization Type [employer]    Authorization Time Period approved 17 PT visits from 11/23/24-01/25/25    Authorization - Visit Number 4    Authorization - Number of Visits 17    Progress Note Due on Visit 10    PT Start Time 1404    PT Stop Time 1445    PT Time Calculation (min) 41 min    Equipment Utilized During Treatment Gait belt    Activity Tolerance Patient tolerated treatment well;Patient limited by fatigue    Behavior During Therapy [facility] for tasks assessed/performed             Past Medical History:  Diagnosis Date   Adenomatous colon polyp 12/2006   Allergic rhinitis    Anemia    Anxiety    Appendicitis 04/2020   Arthritis    Bradycardia    s/p MDT PPM   Cataract    bilateral   Clotting disorder September 26, 2017   Depression    Esophageal stricture    Family history of adverse reaction to anesthesia    maternal uncle with MH, nephew had MH during surgery and was OK, pt not been tested   GERD (gastroesophageal reflux disease)    Heart murmur    Hemorrhoids    Hyperlipidemia    Hypertension    Inguinal hernia    Osteoporosis    Presence of permanent cardiac pacemaker 2006   SSS   PVC (premature ventricular contraction)    Scleritis of right eye    Skin cancer    Basal and squamous cell   Sleep apnea    a. intolerant of CPAP, does wear a mouthpiece   Subdural hematoma (HCC)    a. spontanous 09/2017 b. resolved without intervention   Thyroid  disease    Past Surgical History:  Procedure Laterality Date   ABDOMINAL HYSTERECTOMY     APPENDECTOMY     BREAST BIOPSY Left 01/15/2014   BREAST  BIOPSY Right 06/25/2023   US  RT BREAST BX W LOC DEV 1ST LESION IMG BX SPEC US  GUIDE 06/25/2023 [facility]   BREAST SURGERY     Biopsy-benign   CESAREAN SECTION     COLONOSCOPY  2018   DILATION AND CURETTAGE OF UTERUS     ESOPHAGOGASTRODUODENOSCOPY     esophageal stretching   EYE SURGERY     HAND SURGERY  1992   HERNIA REPAIR     INGUINAL HERNIA REPAIR  02/12/1994   left inguinal hernia repair   JOINT REPLACEMENT  12/21/2014   KNEE ARTHROSCOPY  2011   LAPAROSCOPIC APPENDECTOMY N/A 11/29/2020   Procedure: APPENDECTOMY LAPAROSCOPIC;  Surgeon: Paola Dreama SAILOR, MD;  Location:  SURGERY CENTER;  Service: General;  Laterality: N/A;   OOPHORECTOMY  1995   LSO and RSO   PACEMAKER GENERATOR CHANGE N/A 01/21/2015   Procedure: PACEMAKER GENERATOR CHANGE;  Surgeon: Lynwood Rakers, MD;  Location: [facility] CATH LAB;  Service: Cardiovascular;  Laterality: N/A;   PACEMAKER INSERTION  2006   Dr. Ellin   POLYPECTOMY     Tear duct surgery  2010   THUMB SURGERY     THYROIDECTOMY  1979   RIGHT SIDE   TOTAL KNEE ARTHROPLASTY Right 12/20/2014   Procedure: RIGHT TOTAL KNEE ARTHROPLASTY;  Surgeon: Marcey Raman, MD;  Location: [facility] OR;  Service: Orthopedics;  Laterality: Right;   UPPER GASTROINTESTINAL ENDOSCOPY     unsure   Patient Active Problem List   Diagnosis Date Noted   Muscular deconditioning 10/11/2024   Physical deconditioning 09/14/2024   Trochanteric bursitis 08/26/2024   Hyponatremia 07/28/2024   Reflux esophagitis 05/21/2024   Paresthesia 09/09/2022   Tinnitus 04/24/2022   Vitamin D  deficiency 09/10/2021   Essential hypertension 06/11/2018   History of thyroid  disease 05/10/2016   Osteoarthritis of neck 05/10/2016   S/P total knee arthroplasty 12/20/2014   Palpitations 09/23/2013   Fatigue 08/07/2013   Alopecia 05/11/2013   Benign hypertensive heart disease without heart failure 01/07/2013   Pacemaker-[employer] 10/08/2012   Hiatal hernia 05/28/2012   Esophageal  stricture 05/28/2012   Anxiety 01/16/2012   Sick sinus syndrome (HCC)    Osteoarthritis of knee 04/19/2011   Internal hemorrhoids 02/28/2011   Constipation 02/28/2011   GERD 06/28/2009   Hypercholesterolemia 03/30/2008   Obstructive sleep apnea 03/30/2008   History of SCC (squamous cell carcinoma) of skin 03/30/2008    ONSET DATE: 2 years ago  REFERRING DIAG: G93.31 (ICD-10-CM) - Postviral fatigue syndrome  THERAPY DIAG:  Muscle weakness (generalized)  Difficulty in walking, not elsewhere classified  Unsteadiness on feet  Rationale for Evaluation and Treatment: Rehabilitation  SUBJECTIVE:                                                                                                                                                                                             SUBJECTIVE STATEMENT: I haven't felt great today- changed my blood pressure medicine and haven't been in a good mood today.    Pt accompanied by: self and significant other- assist with subjective   PERTINENT HISTORY: Pacemaker, Anemia, anxiety, bradycardia, clotting disorder, depression, HLD, HTN, osteoporosis, PVC, SDH 2018, R TKA  Per Dr. Kassie 11/11/24 significant fatigue that began almost two years ago following a COVID-19 infection in March 2024. She has hospitalization in August 2025 for hyponatremia. Post-hospitalization, she received in-home physical therapy, which was discontinued due to her fatigue and inability to participate consistently.  PAIN:  Are you having pain? Not anything I'm not used to  PRECAUTIONS: Fall, osteoporosis  RED FLAGS: None   WEIGHT BEARING RESTRICTIONS: No  FALLS: Has patient fallen in last 6 months? Yes. Number of falls 2  LIVING ENVIRONMENT: Lives with: lives with their spouse Lives in: House/apartment  Stairs: 6 steps with handrails; bedroom on 1st floor   Has following equipment at home: Single point cane and Shower bench  PLOF: Independent,  Vocation/Vocational requirements: retired, and Leisure: used to enjoy going for walks but no longer doing this  PATIENT GOALS: improve endurance   OBJECTIVE:     TODAY'S TREATMENT: 12/21/24 Activity Comments  Vitals at start of session 127/91 mmHg, 96% spO2, 77bpm   Nustep L3 x 6 min UEs/LEs  Cueing to choose pace to achieve moderate intensity (~32 SPM)  standing PWR moves for full body mobility and endurance  Up 2x10 Rock 2x10  Building control surveyor. Cueing to avoid flexed posture and increase wt shift.  During activities: 97% spO2, 76 bpm  134/91 mmHg   Sitting with red TB: LAQ x10 each HS curl x10 each     PATIENT EDUCATION: Education details: discussed pt's report of reduced motivation and energy levels- discussed different between motivation and discipline and effects of building good habits  Person educated: Patient Education method: Explanation Education comprehension: verbalized understanding    HOME EXERCISE PROGRAM Last updated: 12/07/24 Access Code: [email or web address] URL: [email or web address] Date: 12/07/2024 Prepared by: [facility]  Program Notes perform 30 sec of the exercise, then 30 sec rest break.  Exercises - Standing Marching  - 1 x daily - 5 x weekly - 2-3 sets - 30 sec hold - Sit to Stand with Arms Crossed  - 1 x daily - 5 x weekly - 2-3 sets - 30 sec hold - Side Stepping with Counter Support  - 1 x daily - 5 x weekly - 2-3 sets - 30 sec hold - Mini Squat + Reach  - 1 x daily - 5 x weekly - 2-3 sets - 30 sec hold     Note: Objective measures were completed at Evaluation unless otherwise noted.  DIAGNOSTIC FINDINGS: none recent  VITALS: 96% spO2, 83bpm, 146/104 mmHg,  COGNITION: Overall cognitive status: Within functional limits for tasks assessed   SENSATION: Pt denies N/T in UEs/LEs   POSTURE: rounded shoulders and forward head   LOWER EXTREMITY MMT:    MMT (in sitting) Right Eval  Left Eval  Hip flexion 4 4+  Hip extension    Hip abduction 4+ 4+  Hip adduction 4+ 4+  Hip internal rotation    Hip external rotation    Knee flexion 4 4  Knee extension 4 4  Ankle dorsiflexion 4+ 4+  Ankle plantarflexion 4 4-  Ankle inversion    Ankle eversion    (Blank rows = not tested)     GAIT: Findings: Assistive device utilized:None, Level of assistance: SBA, and Comments: slowed, reduced B foot clearance and mild imbalance    FUNCTIONAL TESTS:  5 times sit to stand: 12.5 sec pushing off knees     [facility] PT Assessment - 11/23/24 0001       Standardized Balance Assessment   Standardized Balance Assessment Dynamic Gait Index      Dynamic Gait Index   Level Surface Mild Impairment    Change in Gait Speed Moderate Impairment    Gait with Horizontal Head Turns Mild Impairment    Gait with Vertical Head Turns Mild Impairment    Gait and Pivot Turn Mild Impairment    Step Over Obstacle Normal    Step Around Obstacles Moderate Impairment    Steps Mild Impairment    Total Score 15  Vitals after activity: 97% spO2, 84 bpm                                                                                                                                TREATMENT DATE: 11/23/24    PATIENT EDUCATION: Education details: advised pt to monitor BP at home and message PCP if it continues to stay high, prognosis, POC, edu on exam findings and how they relate to functional impairments  Person educated: Patient and Spouse Education method: Explanation Education comprehension: verbalized understanding  HOME EXERCISE PROGRAM: Not yet initiated   GOALS: Goals reviewed with patient? Yes  SHORT TERM GOALS: Target date: 12/21/2024  Patient to be independent with initial HEP. Baseline: HEP initiated Goal status: IN PROGRESS    LONG TERM GOALS: Target date: 01/25/25  Patient to be independent with advanced HEP. Baseline: Not yet initiated  Goal status: IN  PROGRESS  Patient to demonstrate B LE strength >/=4+/5.  Baseline: See above Goal status: IN PROGRESS  Patient to report tolerance for standing/walking in the kitchen to complete chores without fatigue limiting.  Baseline: not performing chores Goal status: IN PROGRESS  Patient to return to walking or recumbent bike program 1-2x/week in order to maintain fitness.  Baseline: not performing.  Goal status: IN PROGRESS  Patient to score at least 20/24 on DGI in order to decrease risk of falls.  Baseline: 15 Goal status: IN PROGRESS  Patient to complete at least 1545 feet during 6 minute walk test in order to meet age-matches norms.  Baseline: 974 feet 12/07/24  Goal status: IN PROGRESS 12/07/24   Patient to demonstrate gait speed of at least 2.9 ft/sec in order to improve access to community.  Baseline: 2.67 ft/sec 12/08/23  Goal status: IN PROGRESS 12/08/23    ASSESSMENT:  CLINICAL IMPRESSION: Patient arrived to session with report of recent change to BP meds. Vitals at start of session were [facility], thus proceeded while monitoring symptoms/vitals. Session focused on increasing tolerance for endurance-type activities. Progressed to full-body mobility activities by incorporating PWR moves for repeated reps. Patient reported fatigue after multiple sets, requiring sitting rest breaks in order to tolerate continued activities. Transitioned to sitting LE strengthening for remainder of session d/t patient's c/o fatigue despite normal physiologic response to exercise. Patient without complaints at end of appointment. OBJECTIVE IMPAIRMENTS: Abnormal gait, decreased activity tolerance, decreased balance, difficulty walking, decreased strength, and postural dysfunction.   ACTIVITY LIMITATIONS: carrying, lifting, bending, standing, squatting, sleeping, stairs, transfers, bathing, dressing, hygiene/grooming, and locomotion level  PARTICIPATION LIMITATIONS: meal prep, cleaning, laundry, shopping, community  activity, yard work, and church  PERSONAL FACTORS: Age, Past/current experiences, Time since onset of injury/illness/exacerbation, and 3+ comorbidities: Pacemaker, Anemia, anxiety, bradycardia, clotting disorder, depression, HLD, HTN, osteoporosis, PVC, SDH 2018, R TKA are also affecting patient's functional outcome.   REHAB POTENTIAL: Good  CLINICAL DECISION MAKING: Evolving/moderate complexity  EVALUATION COMPLEXITY: Moderate  PLAN:  PT FREQUENCY: 1-2x/week  PT DURATION:  8 weeks  PLANNED INTERVENTIONS: 97164- PT Re-evaluation, 97110-Therapeutic exercises, 97530- Therapeutic activity, 97112- Neuromuscular re-education, [phone]- Self Care, 02859- Manual therapy, [phone]- Gait training, [phone]- Canalith repositioning, [phone]- Aquatic Therapy, [phone] (1-2 muscles), [id number] (3+ muscles)- Dry Needling, Patient/Family education, Balance training, Stair training, Taping, Joint mobilization, Spinal mobilization, Vestibular training, Cryotherapy, and Moist heat  PLAN FOR NEXT SESSION: monitor BP, review/progress HEP; work on endurance or stamina   Louana Terrilyn Christians, PT, DPT 12/21/24 2:48 PM  [facility] [address], [address] [place], [place] [address] Phone # [phone] Fax # [phone]      "

## 2024-12-21 ENCOUNTER — Encounter: Payer: Self-pay | Admitting: Physical Therapy

## 2024-12-21 ENCOUNTER — Ambulatory Visit: Admitting: Physical Therapy

## 2024-12-21 DIAGNOSIS — M6281 Muscle weakness (generalized): Secondary | ICD-10-CM

## 2024-12-21 DIAGNOSIS — R2681 Unsteadiness on feet: Secondary | ICD-10-CM

## 2024-12-21 DIAGNOSIS — R262 Difficulty in walking, not elsewhere classified: Secondary | ICD-10-CM

## 2024-12-23 ENCOUNTER — Ambulatory Visit

## 2024-12-23 DIAGNOSIS — M6281 Muscle weakness (generalized): Secondary | ICD-10-CM | POA: Diagnosis not present

## 2024-12-23 DIAGNOSIS — R2681 Unsteadiness on feet: Secondary | ICD-10-CM

## 2024-12-23 DIAGNOSIS — R262 Difficulty in walking, not elsewhere classified: Secondary | ICD-10-CM

## 2024-12-23 NOTE — Therapy (Signed)
 " OUTPATIENT PHYSICAL THERAPY NEURO TREATMENT   Patient Name: [name] MRN: [id number] DOB:1946/02/03, 80 y.o., female Today's Date: 12/23/2024   PCP: Cleatus Arlyss RAMAN, MD  REFERRING PROVIDER: Kassie Acquanetta Bradley, MD  END OF SESSION:  PT End of Session - 12/23/24 1401     Visit Number 5    Number of Visits 17    Date for Recertification  01/25/25    Authorization Type [employer]    Authorization Time Period approved 17 PT visits from 11/23/24-01/25/25    Authorization - Visit Number 5    Authorization - Number of Visits 17    Progress Note Due on Visit 10    PT Start Time 1400    PT Stop Time 1445    PT Time Calculation (min) 45 min    Equipment Utilized During Treatment Gait belt    Activity Tolerance Patient tolerated treatment well;Patient limited by fatigue    Behavior During Therapy [facility] for tasks assessed/performed             Past Medical History:  Diagnosis Date   Adenomatous colon polyp 12/2006   Allergic rhinitis    Anemia    Anxiety    Appendicitis 04/2020   Arthritis    Bradycardia    s/p MDT PPM   Cataract    bilateral   Clotting disorder September 26, 2017   Depression    Esophageal stricture    Family history of adverse reaction to anesthesia    maternal uncle with MH, nephew had MH during surgery and was OK, pt not been tested   GERD (gastroesophageal reflux disease)    Heart murmur    Hemorrhoids    Hyperlipidemia    Hypertension    Inguinal hernia    Osteoporosis    Presence of permanent cardiac pacemaker 2006   SSS   PVC (premature ventricular contraction)    Scleritis of right eye    Skin cancer    Basal and squamous cell   Sleep apnea    a. intolerant of CPAP, does wear a mouthpiece   Subdural hematoma (HCC)    a. spontanous 09/2017 b. resolved without intervention   Thyroid  disease    Past Surgical History:  Procedure Laterality Date   ABDOMINAL HYSTERECTOMY     APPENDECTOMY     BREAST BIOPSY Left 01/15/2014   BREAST  BIOPSY Right 06/25/2023   US  RT BREAST BX W LOC DEV 1ST LESION IMG BX SPEC US  GUIDE 06/25/2023 [facility]   BREAST SURGERY     Biopsy-benign   CESAREAN SECTION     COLONOSCOPY  2018   DILATION AND CURETTAGE OF UTERUS     ESOPHAGOGASTRODUODENOSCOPY     esophageal stretching   EYE SURGERY     HAND SURGERY  1992   HERNIA REPAIR     INGUINAL HERNIA REPAIR  02/12/1994   left inguinal hernia repair   JOINT REPLACEMENT  12/21/2014   KNEE ARTHROSCOPY  2011   LAPAROSCOPIC APPENDECTOMY N/A 11/29/2020   Procedure: APPENDECTOMY LAPAROSCOPIC;  Surgeon: Paola Dreama SAILOR, MD;  Location: [facility];  Service: General;  Laterality: N/A;   OOPHORECTOMY  1995   LSO and RSO   PACEMAKER GENERATOR CHANGE N/A 01/21/2015   Procedure: PACEMAKER GENERATOR CHANGE;  Surgeon: Lynwood Rakers, MD;  Location: [facility] CATH LAB;  Service: Cardiovascular;  Laterality: N/A;   PACEMAKER INSERTION  2006   Dr. Ellin   POLYPECTOMY     Tear duct surgery  2010   THUMB SURGERY     THYROIDECTOMY  1979   RIGHT SIDE   TOTAL KNEE ARTHROPLASTY Right 12/20/2014   Procedure: RIGHT TOTAL KNEE ARTHROPLASTY;  Surgeon: Marcey Raman, MD;  Location: [facility] OR;  Service: Orthopedics;  Laterality: Right;   UPPER GASTROINTESTINAL ENDOSCOPY     unsure   Patient Active Problem List   Diagnosis Date Noted   Muscular deconditioning 10/11/2024   Physical deconditioning 09/14/2024   Trochanteric bursitis 08/26/2024   Hyponatremia 07/28/2024   Reflux esophagitis 05/21/2024   Paresthesia 09/09/2022   Tinnitus 04/24/2022   Vitamin D  deficiency 09/10/2021   Essential hypertension 06/11/2018   History of thyroid  disease 05/10/2016   Osteoarthritis of neck 05/10/2016   S/P total knee arthroplasty 12/20/2014   Palpitations 09/23/2013   Fatigue 08/07/2013   Alopecia 05/11/2013   Benign hypertensive heart disease without heart failure 01/07/2013   Pacemaker-[employer] 10/08/2012   Hiatal hernia 05/28/2012   Esophageal  stricture 05/28/2012   Anxiety 01/16/2012   Sick sinus syndrome (HCC)    Osteoarthritis of knee 04/19/2011   Internal hemorrhoids 02/28/2011   Constipation 02/28/2011   GERD 06/28/2009   Hypercholesterolemia 03/30/2008   Obstructive sleep apnea 03/30/2008   History of SCC (squamous cell carcinoma) of skin 03/30/2008    ONSET DATE: 2 years ago  REFERRING DIAG: G93.31 (ICD-10-CM) - Postviral fatigue syndrome  THERAPY DIAG:  Muscle weakness (generalized)  Difficulty in walking, not elsewhere classified  Unsteadiness on feet  Rationale for Evaluation and Treatment: Rehabilitation  SUBJECTIVE:                                                                                                                                                                                             SUBJECTIVE STATEMENT: Doing ok, some days   Pt accompanied by: self and significant other- assist with subjective   PERTINENT HISTORY: Pacemaker, Anemia, anxiety, bradycardia, clotting disorder, depression, HLD, HTN, osteoporosis, PVC, SDH 2018, R TKA  Per Dr. Kassie 11/11/24 significant fatigue that began almost two years ago following a COVID-19 infection in March 2024. She has hospitalization in August 2025 for hyponatremia. Post-hospitalization, she received in-home physical therapy, which was discontinued due to her fatigue and inability to participate consistently.  PAIN:  Are you having pain? Not anything I'm not used to  PRECAUTIONS: Fall, osteoporosis  RED FLAGS: None   WEIGHT BEARING RESTRICTIONS: No  FALLS: Has patient fallen in last 6 months? Yes. Number of falls 2  LIVING ENVIRONMENT: Lives with: lives with their spouse Lives in: House/apartment Stairs: 6 steps with handrails; bedroom on 1st floor   Has following equipment at  home: Single point cane and Shower bench  PLOF: Independent, Vocation/Vocational requirements: retired, and Leisure: used to enjoy going for walks but  no longer doing this  PATIENT GOALS: improve endurance   OBJECTIVE:   TODAY'S TREATMENT: 12/23/24 Activity Comments  132/93 mmHg, 78 bpm, 97%   NU-step x 8 min, speed intervals level 4 resistance 2 min warm-up 30 sec speed (60 SPM); 60 sec recovery pace (30-40 spm)  Alt stair taps x 2 min 3#, 6  Sidestep x 2 min 3#, limited foot clearance-sliding feet. 96%, 80 bpm  LAQ 3x15   Alt step-ups x 2 min 6 step  Static multisensory balance  Wide BOS, narrow BOS, EO/EC. Postural perturabtions--mild-moderate sway       TODAY'S TREATMENT: 12/21/24 Activity Comments  Vitals at start of session 127/91 mmHg, 96% spO2, 77bpm   Nustep L3 x 6 min UEs/LEs  Cueing to choose pace to achieve moderate intensity (~32 SPM)  standing PWR moves for full body mobility and endurance  Up 2x10 Rock 2x10  Building control surveyor. Cueing to avoid flexed posture and increase wt shift.  During activities: 97% spO2, 76 bpm  134/91 mmHg   Sitting with red TB: LAQ x10 each HS curl x10 each     PATIENT EDUCATION: Education details: discussed pt's report of reduced motivation and energy levels- discussed different between motivation and discipline and effects of building good habits  Person educated: Patient Education method: Explanation Education comprehension: verbalized understanding    HOME EXERCISE PROGRAM Last updated: 12/07/24 Access Code: [email or web address] URL: [email or web address] Date: 12/07/2024 Prepared by: [facility] - Outpatient  Rehab - [facility]  Program Notes perform 30 sec of the exercise, then 30 sec rest break.  Exercises - Standing Marching  - 1 x daily - 5 x weekly - 2-3 sets - 30 sec hold - Sit to Stand with Arms Crossed  - 1 x daily - 5 x weekly - 2-3 sets - 30 sec hold - Side Stepping with Counter Support  - 1 x daily - 5 x weekly - 2-3 sets - 30 sec hold - Mini Squat + Reach  - 1 x daily - 5 x weekly - 2-3 sets - 30 sec hold     Note: Objective measures  were completed at Evaluation unless otherwise noted.  DIAGNOSTIC FINDINGS: none recent  VITALS: 96% spO2, 83bpm, 146/104 mmHg,  COGNITION: Overall cognitive status: Within functional limits for tasks assessed   SENSATION: Pt denies N/T in UEs/LEs   POSTURE: rounded shoulders and forward head   LOWER EXTREMITY MMT:    MMT (in sitting) Right Eval Left Eval  Hip flexion 4 4+  Hip extension    Hip abduction 4+ 4+  Hip adduction 4+ 4+  Hip internal rotation    Hip external rotation    Knee flexion 4 4  Knee extension 4 4  Ankle dorsiflexion 4+ 4+  Ankle plantarflexion 4 4-  Ankle inversion    Ankle eversion    (Blank rows = not tested)     GAIT: Findings: Assistive device utilized:None, Level of assistance: SBA, and Comments: slowed, reduced B foot clearance and mild imbalance    FUNCTIONAL TESTS:  5 times sit to stand: 12.5 sec pushing off knees     [facility] PT Assessment - 11/23/24 0001       Standardized Balance Assessment   Standardized Balance Assessment Dynamic Gait Index      Dynamic Gait Index   Level Surface Mild Impairment  Change in Gait Speed Moderate Impairment    Gait with Horizontal Head Turns Mild Impairment    Gait with Vertical Head Turns Mild Impairment    Gait and Pivot Turn Mild Impairment    Step Over Obstacle Normal    Step Around Obstacles Moderate Impairment    Steps Mild Impairment    Total Score 15            Vitals after activity: 97% spO2, 84 bpm                                                                                                                                TREATMENT DATE: 11/23/24    PATIENT EDUCATION: Education details: advised pt to monitor BP at home and message PCP if it continues to stay high, prognosis, POC, edu on exam findings and how they relate to functional impairments  Person educated: Patient and Spouse Education method: Explanation Education comprehension: verbalized understanding  HOME  EXERCISE PROGRAM: Not yet initiated   GOALS: Goals reviewed with patient? Yes  SHORT TERM GOALS: Target date: 12/21/2024  Patient to be independent with initial HEP. Baseline: HEP initiated Goal status: IN PROGRESS    LONG TERM GOALS: Target date: 01/25/25  Patient to be independent with advanced HEP. Baseline: Not yet initiated  Goal status: IN PROGRESS  Patient to demonstrate B LE strength >/=4+/5.  Baseline: See above Goal status: IN PROGRESS  Patient to report tolerance for standing/walking in the kitchen to complete chores without fatigue limiting.  Baseline: not performing chores Goal status: IN PROGRESS  Patient to return to walking or recumbent bike program 1-2x/week in order to maintain fitness.  Baseline: not performing.  Goal status: IN PROGRESS  Patient to score at least 20/24 on DGI in order to decrease risk of falls.  Baseline: 15 Goal status: IN PROGRESS  Patient to complete at least 1545 feet during 6 minute walk test in order to meet age-matches norms.  Baseline: 974 feet 12/07/24  Goal status: IN PROGRESS 12/07/24   Patient to demonstrate gait speed of at least 2.9 ft/sec in order to improve access to community.  Baseline: 2.67 ft/sec 12/08/23  Goal status: IN PROGRESS 12/08/23    ASSESSMENT:  CLINICAL IMPRESSION: Session with focus on improving endurance to improve activity tolerance with focus on time-based activities for pacing and vitals monitored throughout with good tolerance and vitals maintained throughout.  Only requiring 1 2-minute therapuetic rest period related to LE fatigue and only experienced DOE with 2 minute step-up activity.  Pt would benefit from continued sessions with focus on increased intensity to progress. Static balance activities to improve standing tolerance and facilitation of righting reactions for safety with ADL exhiiting mild-moderate sway throughout OBJECTIVE IMPAIRMENTS: Abnormal gait, decreased activity tolerance, decreased  balance, difficulty walking, decreased strength, and postural dysfunction.   ACTIVITY LIMITATIONS: carrying, lifting, bending, standing, squatting, sleeping, stairs, transfers, bathing, dressing, hygiene/grooming, and locomotion level  PARTICIPATION LIMITATIONS:  meal prep, cleaning, laundry, shopping, community activity, yard work, and church  PERSONAL FACTORS: Age, Past/current experiences, Time since onset of injury/illness/exacerbation, and 3+ comorbidities: Pacemaker, Anemia, anxiety, bradycardia, clotting disorder, depression, HLD, HTN, osteoporosis, PVC, SDH 2018, R TKA are also affecting patient's functional outcome.   REHAB POTENTIAL: Good  CLINICAL DECISION MAKING: Evolving/moderate complexity  EVALUATION COMPLEXITY: Moderate  PLAN:  PT FREQUENCY: 1-2x/week  PT DURATION: 8 weeks  PLANNED INTERVENTIONS: 97164- PT Re-evaluation, 97110-Therapeutic exercises, 97530- Therapeutic activity, 97112- Neuromuscular re-education, 97535- Self Care, [id number]- Manual therapy, [id number]- Gait training, [phone]- Canalith repositioning, [id number]- Aquatic Therapy, [phone] (1-2 muscles), [id number] (3+ muscles)- Dry Needling, Patient/Family education, Balance training, Stair training, Taping, Joint mobilization, Spinal mobilization, Vestibular training, Cryotherapy, and Moist heat  PLAN FOR NEXT SESSION: monitor BP, review/progress HEP; work on endurance or stamina   2:47 PM, 12/23/24 M. Kelly Adrick Kestler, PT, DPT Physical Therapist- [place] Office Number: [phone]      "

## 2024-12-25 ENCOUNTER — Other Ambulatory Visit: Payer: Self-pay | Admitting: Family Medicine

## 2024-12-25 DIAGNOSIS — Z0100 Encounter for examination of eyes and vision without abnormal findings: Secondary | ICD-10-CM

## 2024-12-25 DIAGNOSIS — R5383 Other fatigue: Secondary | ICD-10-CM

## 2024-12-25 DIAGNOSIS — I10 Essential (primary) hypertension: Secondary | ICD-10-CM

## 2024-12-25 NOTE — Addendum Note (Signed)
 Addended by: CLEATUS LORELI RAMAN on: 12/25/2024 10:25 AM   Modules accepted: Orders

## 2024-12-28 ENCOUNTER — Ambulatory Visit

## 2024-12-28 NOTE — Therapy (Incomplete)
 " OUTPATIENT PHYSICAL THERAPY NEURO TREATMENT   Patient Name: [name] MRN: [id number] DOB:03/25/1946, 79 y.o., female Today's Date: 12/28/2024   PCP: Cleatus Arlyss RAMAN, MD  REFERRING PROVIDER: Kassie Acquanetta Bradley, MD  END OF SESSION:       Past Medical History:  Diagnosis Date   Adenomatous colon polyp 12/2006   Allergic rhinitis    Anemia    Anxiety    Appendicitis 04/2020   Arthritis    Bradycardia    s/p MDT PPM   Cataract    bilateral   Clotting disorder September 26, 2017   Depression    Esophageal stricture    Family history of adverse reaction to anesthesia    maternal uncle with MH, nephew had MH during surgery and was OK, pt not been tested   GERD (gastroesophageal reflux disease)    Heart murmur    Hemorrhoids    Hyperlipidemia    Hypertension    Inguinal hernia    Osteoporosis    Presence of permanent cardiac pacemaker 2006   SSS   PVC (premature ventricular contraction)    Scleritis of right eye    Skin cancer    Basal and squamous cell   Sleep apnea    a. intolerant of CPAP, does wear a mouthpiece   Subdural hematoma (HCC)    a. spontanous 09/2017 b. resolved without intervention   Thyroid  disease    Past Surgical History:  Procedure Laterality Date   ABDOMINAL HYSTERECTOMY     APPENDECTOMY     BREAST BIOPSY Left 01/15/2014   BREAST BIOPSY Right 06/25/2023   US  RT BREAST BX W LOC DEV 1ST LESION IMG BX SPEC US  GUIDE 06/25/2023 [facility]   BREAST SURGERY     Biopsy-benign   CESAREAN SECTION     COLONOSCOPY  2018   DILATION AND CURETTAGE OF UTERUS     ESOPHAGOGASTRODUODENOSCOPY     esophageal stretching   EYE SURGERY     HAND SURGERY  1992   HERNIA REPAIR     INGUINAL HERNIA REPAIR  02/12/1994   left inguinal hernia repair   JOINT REPLACEMENT  12/21/2014   KNEE ARTHROSCOPY  2011   LAPAROSCOPIC APPENDECTOMY N/A 11/29/2020   Procedure: APPENDECTOMY LAPAROSCOPIC;  Surgeon: Paola Dreama SAILOR, MD;  Location: [facility];  Service: General;  Laterality: N/A;   OOPHORECTOMY  1995   LSO and RSO   PACEMAKER GENERATOR CHANGE N/A 01/21/2015   Procedure: PACEMAKER GENERATOR CHANGE;  Surgeon: Lynwood Rakers, MD;  Location: [facility] CATH LAB;  Service: Cardiovascular;  Laterality: N/A;   PACEMAKER INSERTION  2006   Dr. Ellin   POLYPECTOMY     Tear duct surgery  2010   THUMB SURGERY     THYROIDECTOMY  1979   RIGHT SIDE   TOTAL KNEE ARTHROPLASTY Right 12/20/2014   Procedure: RIGHT TOTAL KNEE ARTHROPLASTY;  Surgeon: Marcey Raman, MD;  Location: [facility] OR;  Service: Orthopedics;  Laterality: Right;   UPPER GASTROINTESTINAL ENDOSCOPY     unsure   Patient Active Problem List   Diagnosis Date Noted   Muscular deconditioning 10/11/2024   Physical deconditioning 09/14/2024   Trochanteric bursitis 08/26/2024   Hyponatremia 07/28/2024   Reflux esophagitis 05/21/2024   Paresthesia 09/09/2022   Tinnitus 04/24/2022   Vitamin D  deficiency 09/10/2021   Essential hypertension 06/11/2018   History of thyroid  disease 05/10/2016   Osteoarthritis of neck 05/10/2016   S/P total knee arthroplasty 12/20/2014   Palpitations 09/23/2013  Fatigue 08/07/2013   Alopecia 05/11/2013   Benign hypertensive heart disease without heart failure 01/07/2013   Pacemaker-[employer] 10/08/2012   Hiatal hernia 05/28/2012   Esophageal stricture 05/28/2012   Anxiety 01/16/2012   Sick sinus syndrome (HCC)    Osteoarthritis of knee 04/19/2011   Internal hemorrhoids 02/28/2011   Constipation 02/28/2011   GERD 06/28/2009   Hypercholesterolemia 03/30/2008   Obstructive sleep apnea 03/30/2008   History of SCC (squamous cell carcinoma) of skin 03/30/2008    ONSET DATE: 2 years ago  REFERRING DIAG: G93.31 (ICD-10-CM) - Postviral fatigue syndrome  THERAPY DIAG:  No diagnosis found.  Rationale for Evaluation and Treatment: Rehabilitation  SUBJECTIVE:                                                                                                                                                                                              SUBJECTIVE STATEMENT: Doing ok, some days   Pt accompanied by: self and significant other- assist with subjective   PERTINENT HISTORY: Pacemaker, Anemia, anxiety, bradycardia, clotting disorder, depression, HLD, HTN, osteoporosis, PVC, SDH 2018, R TKA  Per Dr. Kassie 11/11/24 significant fatigue that began almost two years ago following a COVID-19 infection in March 2024. She has hospitalization in August 2025 for hyponatremia. Post-hospitalization, she received in-home physical therapy, which was discontinued due to her fatigue and inability to participate consistently.  PAIN:  Are you having pain? Not anything I'm not used to  PRECAUTIONS: Fall, osteoporosis  RED FLAGS: None   WEIGHT BEARING RESTRICTIONS: No  FALLS: Has patient fallen in last 6 months? Yes. Number of falls 2  LIVING ENVIRONMENT: Lives with: lives with their spouse Lives in: House/apartment Stairs: 6 steps with handrails; bedroom on 1st floor   Has following equipment at home: Single point cane and Shower bench  PLOF: Independent, Vocation/Vocational requirements: retired, and Leisure: used to enjoy going for walks but no longer doing this  PATIENT GOALS: improve endurance   OBJECTIVE:     TODAY'S TREATMENT: 12/29/24 Activity Comments                         TODAY'S TREATMENT: 12/23/24 Activity Comments  132/93 mmHg, 78 bpm, 97%   NU-step x 8 min, speed intervals level 4 resistance 2 min warm-up 30 sec speed (60 SPM); 60 sec recovery pace (30-40 spm)  Alt stair taps x 2 min 3#, 6  Sidestep x 2 min 3#, limited foot clearance-sliding feet. 96%, 80 bpm  LAQ 3x15   Alt step-ups x 2 min 6 step  Static multisensory balance  Wide BOS, narrow BOS,  EO/EC. Postural perturabtions--mild-moderate sway       TODAY'S TREATMENT: 12/21/24 Activity Comments  Vitals at start of session 127/91 mmHg, 96%  spO2, 77bpm   Nustep L3 x 6 min UEs/LEs  Cueing to choose pace to achieve moderate intensity (~32 SPM)  standing PWR moves for full body mobility and endurance  Up 2x10 Rock 2x10  Building control surveyor. Cueing to avoid flexed posture and increase wt shift.  During activities: 97% spO2, 76 bpm  134/91 mmHg   Sitting with red TB: LAQ x10 each HS curl x10 each     PATIENT EDUCATION: Education details: discussed pt's report of reduced motivation and energy levels- discussed different between motivation and discipline and effects of building good habits  Person educated: Patient Education method: Explanation Education comprehension: verbalized understanding    HOME EXERCISE PROGRAM Last updated: 12/07/24 Access Code: [email or web address] URL: [email or web address] Date: 12/07/2024 Prepared by: [facility] - [facility]  Program Notes perform 30 sec of the exercise, then 30 sec rest break.  Exercises - Standing Marching  - 1 x daily - 5 x weekly - 2-3 sets - 30 sec hold - Sit to Stand with Arms Crossed  - 1 x daily - 5 x weekly - 2-3 sets - 30 sec hold - Side Stepping with Counter Support  - 1 x daily - 5 x weekly - 2-3 sets - 30 sec hold - Mini Squat + Reach  - 1 x daily - 5 x weekly - 2-3 sets - 30 sec hold     Note: Objective measures were completed at Evaluation unless otherwise noted.  DIAGNOSTIC FINDINGS: none recent  VITALS: 96% spO2, 83bpm, 146/104 mmHg,  COGNITION: Overall cognitive status: Within functional limits for tasks assessed   SENSATION: Pt denies N/T in UEs/LEs   POSTURE: rounded shoulders and forward head   LOWER EXTREMITY MMT:    MMT (in sitting) Right Eval Left Eval  Hip flexion 4 4+  Hip extension    Hip abduction 4+ 4+  Hip adduction 4+ 4+  Hip internal rotation    Hip external rotation    Knee flexion 4 4  Knee extension 4 4  Ankle dorsiflexion 4+ 4+  Ankle plantarflexion 4 4-  Ankle inversion     Ankle eversion    (Blank rows = not tested)     GAIT: Findings: Assistive device utilized:None, Level of assistance: SBA, and Comments: slowed, reduced B foot clearance and mild imbalance    FUNCTIONAL TESTS:  5 times sit to stand: 12.5 sec pushing off knees     [facility] PT Assessment - 11/23/24 0001       Standardized Balance Assessment   Standardized Balance Assessment Dynamic Gait Index      Dynamic Gait Index   Level Surface Mild Impairment    Change in Gait Speed Moderate Impairment    Gait with Horizontal Head Turns Mild Impairment    Gait with Vertical Head Turns Mild Impairment    Gait and Pivot Turn Mild Impairment    Step Over Obstacle Normal    Step Around Obstacles Moderate Impairment    Steps Mild Impairment    Total Score 15            Vitals after activity: 97% spO2, 84 bpm  TREATMENT DATE: 11/23/24    PATIENT EDUCATION: Education details: advised pt to monitor BP at home and message PCP if it continues to stay high, prognosis, POC, edu on exam findings and how they relate to functional impairments  Person educated: Patient and Spouse Education method: Explanation Education comprehension: verbalized understanding  HOME EXERCISE PROGRAM: Not yet initiated   GOALS: Goals reviewed with patient? Yes  SHORT TERM GOALS: Target date: 12/21/2024  Patient to be independent with initial HEP. Baseline: HEP initiated Goal status: IN PROGRESS    LONG TERM GOALS: Target date: 01/25/25  Patient to be independent with advanced HEP. Baseline: Not yet initiated  Goal status: IN PROGRESS  Patient to demonstrate B LE strength >/=4+/5.  Baseline: See above Goal status: IN PROGRESS  Patient to report tolerance for standing/walking in the kitchen to complete chores without fatigue limiting.  Baseline: not performing chores Goal  status: IN PROGRESS  Patient to return to walking or recumbent bike program 1-2x/week in order to maintain fitness.  Baseline: not performing.  Goal status: IN PROGRESS  Patient to score at least 20/24 on DGI in order to decrease risk of falls.  Baseline: 15 Goal status: IN PROGRESS  Patient to complete at least 1545 feet during 6 minute walk test in order to meet age-matches norms.  Baseline: 974 feet 12/07/24  Goal status: IN PROGRESS 12/07/24   Patient to demonstrate gait speed of at least 2.9 ft/sec in order to improve access to community.  Baseline: 2.67 ft/sec 12/08/23  Goal status: IN PROGRESS 12/08/23    ASSESSMENT:  CLINICAL IMPRESSION: Session with focus on improving endurance to improve activity tolerance with focus on time-based activities for pacing and vitals monitored throughout with good tolerance and vitals maintained throughout.  Only requiring 1 2-minute therapuetic rest period related to LE fatigue and only experienced DOE with 2 minute step-up activity.  Pt would benefit from continued sessions with focus on increased intensity to progress. Static balance activities to improve standing tolerance and facilitation of righting reactions for safety with ADL exhiiting mild-moderate sway throughout OBJECTIVE IMPAIRMENTS: Abnormal gait, decreased activity tolerance, decreased balance, difficulty walking, decreased strength, and postural dysfunction.   ACTIVITY LIMITATIONS: carrying, lifting, bending, standing, squatting, sleeping, stairs, transfers, bathing, dressing, hygiene/grooming, and locomotion level  PARTICIPATION LIMITATIONS: meal prep, cleaning, laundry, shopping, community activity, yard work, and church  PERSONAL FACTORS: Age, Past/current experiences, Time since onset of injury/illness/exacerbation, and 3+ comorbidities: Pacemaker, Anemia, anxiety, bradycardia, clotting disorder, depression, HLD, HTN, osteoporosis, PVC, SDH 2018, R TKA are also affecting patient's  functional outcome.   REHAB POTENTIAL: Good  CLINICAL DECISION MAKING: Evolving/moderate complexity  EVALUATION COMPLEXITY: Moderate  PLAN:  PT FREQUENCY: 1-2x/week  PT DURATION: 8 weeks  PLANNED INTERVENTIONS: [id number]- PT Re-evaluation, 97110-Therapeutic exercises, 97530- Therapeutic activity, 97112- Neuromuscular re-education, 97535- Self Care, [id number]- Manual therapy, [id number]- Gait training, [phone]- Canalith repositioning, [id number]- Aquatic Therapy, [phone] (1-2 muscles), [id number] (3+ muscles)- Dry Needling, Patient/Family education, Balance training, Stair training, Taping, Joint mobilization, Spinal mobilization, Vestibular training, Cryotherapy, and Moist heat  PLAN FOR NEXT SESSION: monitor BP, review/progress HEP; work on endurance or stamina   6:42 PM, 12/28/24 M. Kelly Halpin, PT, DPT Physical Therapist- [place] Office Number: [phone]      "

## 2024-12-29 ENCOUNTER — Ambulatory Visit: Admitting: Physical Therapy

## 2024-12-30 NOTE — Therapy (Incomplete)
 " OUTPATIENT PHYSICAL THERAPY NEURO TREATMENT   Patient Name: [name] MRN: [id number] DOB:03-04-1946, 79 y.o., female Today's Date: 12/30/2024   PCP: Cleatus Arlyss RAMAN, MD  REFERRING PROVIDER: Kassie Acquanetta Bradley, MD  END OF SESSION:       Past Medical History:  Diagnosis Date   Adenomatous colon polyp 12/2006   Allergic rhinitis    Anemia    Anxiety    Appendicitis 04/2020   Arthritis    Bradycardia    s/p MDT PPM   Cataract    bilateral   Clotting disorder September 26, 2017   Depression    Esophageal stricture    Family history of adverse reaction to anesthesia    maternal uncle with MH, nephew had MH during surgery and was OK, pt not been tested   GERD (gastroesophageal reflux disease)    Heart murmur    Hemorrhoids    Hyperlipidemia    Hypertension    Inguinal hernia    Osteoporosis    Presence of permanent cardiac pacemaker 2006   SSS   PVC (premature ventricular contraction)    Scleritis of right eye    Skin cancer    Basal and squamous cell   Sleep apnea    a. intolerant of CPAP, does wear a mouthpiece   Subdural hematoma (HCC)    a. spontanous 09/2017 b. resolved without intervention   Thyroid  disease    Past Surgical History:  Procedure Laterality Date   ABDOMINAL HYSTERECTOMY     APPENDECTOMY     BREAST BIOPSY Left 01/15/2014   BREAST BIOPSY Right 06/25/2023   US  RT BREAST BX W LOC DEV 1ST LESION IMG BX SPEC US  GUIDE 06/25/2023 [facility]   BREAST SURGERY     Biopsy-benign   CESAREAN SECTION     COLONOSCOPY  2018   DILATION AND CURETTAGE OF UTERUS     ESOPHAGOGASTRODUODENOSCOPY     esophageal stretching   EYE SURGERY     HAND SURGERY  1992   HERNIA REPAIR     INGUINAL HERNIA REPAIR  02/12/1994   left inguinal hernia repair   JOINT REPLACEMENT  12/21/2014   KNEE ARTHROSCOPY  2011   LAPAROSCOPIC APPENDECTOMY N/A 11/29/2020   Procedure: APPENDECTOMY LAPAROSCOPIC;  Surgeon: Paola Dreama SAILOR, MD;  Location: [facility];  Service: General;  Laterality: N/A;   OOPHORECTOMY  1995   LSO and RSO   PACEMAKER GENERATOR CHANGE N/A 01/21/2015   Procedure: PACEMAKER GENERATOR CHANGE;  Surgeon: Lynwood Rakers, MD;  Location[facility] CATH LAB;  Service: Cardiovascular;  Laterality: N/A;   PACEMAKER INSERTION  2006   Dr. Ellin   POLYPECTOMY     Tear duct surgery  2010   THUMB SURGERY     THYROIDECTOMY  1979   RIGHT SIDE   TOTAL KNEE ARTHROPLASTY Right 12/20/2014   Procedure: RIGHT TOTAL KNEE ARTHROPLASTY;  Surgeon: Marcey Raman, MD;  Location: [facility] OR;  Service: Orthopedics;  Laterality: Right;   UPPER GASTROINTESTINAL ENDOSCOPY     unsure   Patient Active Problem List   Diagnosis Date Noted   Muscular deconditioning 10/11/2024   Physical deconditioning 09/14/2024   Trochanteric bursitis 08/26/2024   Hyponatremia 07/28/2024   Reflux esophagitis 05/21/2024   Paresthesia 09/09/2022   Tinnitus 04/24/2022   Vitamin D  deficiency 09/10/2021   Essential hypertension 06/11/2018   History of thyroid  disease 05/10/2016   Osteoarthritis of neck 05/10/2016   S/P total knee arthroplasty 12/20/2014   Palpitations 09/23/2013  Fatigue 08/07/2013   Alopecia 05/11/2013   Benign hypertensive heart disease without heart failure 01/07/2013   Pacemaker-[employer] 10/08/2012   Hiatal hernia 05/28/2012   Esophageal stricture 05/28/2012   Anxiety 01/16/2012   Sick sinus syndrome (HCC)    Osteoarthritis of knee 04/19/2011   Internal hemorrhoids 02/28/2011   Constipation 02/28/2011   GERD 06/28/2009   Hypercholesterolemia 03/30/2008   Obstructive sleep apnea 03/30/2008   History of SCC (squamous cell carcinoma) of skin 03/30/2008    ONSET DATE: 2 years ago  REFERRING DIAG: G93.31 (ICD-10-CM) - Postviral fatigue syndrome  THERAPY DIAG:  No diagnosis found.  Rationale for Evaluation and Treatment: Rehabilitation  SUBJECTIVE:                                                                                                                                                                                              SUBJECTIVE STATEMENT: Doing ok, some days   Pt accompanied by: self and significant other- assist with subjective   PERTINENT HISTORY: Pacemaker, Anemia, anxiety, bradycardia, clotting disorder, depression, HLD, HTN, osteoporosis, PVC, SDH 2018, R TKA  Per Dr. Kassie 11/11/24 significant fatigue that began almost two years ago following a COVID-19 infection in March 2024. She has hospitalization in August 2025 for hyponatremia. Post-hospitalization, she received in-home physical therapy, which was discontinued due to her fatigue and inability to participate consistently.  PAIN:  Are you having pain? Not anything I'm not used to  PRECAUTIONS: Fall, osteoporosis  RED FLAGS: None   WEIGHT BEARING RESTRICTIONS: No  FALLS: Has patient fallen in last 6 months? Yes. Number of falls 2  LIVING ENVIRONMENT: Lives with: lives with their spouse Lives in: House/apartment Stairs: 6 steps with handrails; bedroom on 1st floor   Has following equipment at home: Single point cane and Shower bench  PLOF: Independent, Vocation/Vocational requirements: retired, and Leisure: used to enjoy going for walks but no longer doing this  PATIENT GOALS: improve endurance   OBJECTIVE:     TODAY'S TREATMENT: 12/31/24 Activity Comments                         TODAY'S TREATMENT: 12/23/24 Activity Comments  132/93 mmHg, 78 bpm, 97%   NU-step x 8 min, speed intervals level 4 resistance 2 min warm-up 30 sec speed (60 SPM); 60 sec recovery pace (30-40 spm)  Alt stair taps x 2 min 3#, 6  Sidestep x 2 min 3#, limited foot clearance-sliding feet. 96%, 80 bpm  LAQ 3x15   Alt step-ups x 2 min 6 step  Static multisensory balance  Wide BOS, narrow BOS,  EO/EC. Postural perturabtions--mild-moderate sway       TODAY'S TREATMENT: 12/21/24 Activity Comments  Vitals at start of session 127/91 mmHg, 96%  spO2, 77bpm   Nustep L3 x 6 min UEs/LEs  Cueing to choose pace to achieve moderate intensity (~32 SPM)  standing PWR moves for full body mobility and endurance  Up 2x10 Rock 2x10  Building control surveyor. Cueing to avoid flexed posture and increase wt shift.  During activities: 97% spO2, 76 bpm  134/91 mmHg   Sitting with red TB: LAQ x10 each HS curl x10 each     PATIENT EDUCATION: Education details: discussed pt's report of reduced motivation and energy levels- discussed different between motivation and discipline and effects of building good habits  Person educated: Patient Education method: Explanation Education comprehension: verbalized understanding    HOME EXERCISE PROGRAM Last updated: 12/07/24 Access Code: [email or web address] URL: [email or web address] Date: 12/07/2024 Prepared by: [facility] [place] [facility] Outpatient  Re[facility] - [facility]  Program Notes perform 30 sec of the exercise, then 30 sec rest break.  Exercises - Standing Marching  - 1 x daily - 5 x weekly - 2-3 sets - 30 sec hold - Sit to Stand with Arms Crossed  - 1 x daily - 5 x weekly - 2-3 sets - 30 sec hold - Side Stepping with Counter Support  - 1 x daily - 5 x weekly - 2-3 sets - 30 sec hold - Mini Squat + Reach  - 1 x daily - 5 x weekly - 2-3 sets - 30 sec hold     Note: Objective measures were completed at Evaluation unless otherwise noted.  DIAGNOSTIC FINDINGS: none recent  VITALS: 96% spO2, 83bpm, 146/104 mmHg,  COGNITION: Overall cognitive status: Within functional limits for tasks assessed   SENSATION: Pt denies N/T in UEs/LEs   POSTURE: rounded shoulders and forward head   LOWER EXTREMITY MMT:    MMT (in sitting) Right Eval Left Eval  Hip flexion 4 4+  Hip extension    Hip abduction 4+ 4+  Hip adduction 4+ 4+  Hip internal rotation    Hip external rotation    Knee flexion 4 4  Knee extension 4 4  Ankle dorsiflexion 4+ 4+  Ankle plantarflexion 4 4-  Ankle inversion     Ankle eversion    (Blank rows = not tested)     GAIT: Findings: Assistive device utilized:None, Level of assistance: SBA, and Comments: slowed, reduced B foot clearance and mild imbalance    FUNCTIONAL TESTS:  5 times sit to stand: 12.5 sec pushing off knees     [facility] PT Assessment - 11/23/24 0001       Standardized Balance Assessment   Standardized Balance Assessment Dynamic Gait Index      Dynamic Gait Index   Level Surface Mild Impairment    Change in Gait Speed Moderate Impairment    Gait with Horizontal Head Turns Mild Impairment    Gait with Vertical Head Turns Mild Impairment    Gait and Pivot Turn Mild Impairment    Step Over Obstacle Normal    Step Around Obstacles Moderate Impairment    Steps Mild Impairment    Total Score 15            Vitals after activity: 97% spO2, 84 bpm  TREATMENT DATE: 11/23/24    PATIENT EDUCATION: Education details: advised pt to monitor BP at home and message PCP if it continues to stay high, prognosis, POC, edu on exam findings and how they relate to functional impairments  Person educated: Patient and Spouse Education method: Explanation Education comprehension: verbalized understanding  HOME EXERCISE PROGRAM: Not yet initiated   GOALS: Goals reviewed with patient? Yes  SHORT TERM GOALS: Target date: 12/21/2024  Patient to be independent with initial HEP. Baseline: HEP initiated Goal status: IN PROGRESS    LONG TERM GOALS: Target date: 01/25/25  Patient to be independent with advanced HEP. Baseline: Not yet initiated  Goal status: IN PROGRESS  Patient to demonstrate B LE strength >/=4+/5.  Baseline: See above Goal status: IN PROGRESS  Patient to report tolerance for standing/walking in the kitchen to complete chores without fatigue limiting.  Baseline: not performing chores Goal  status: IN PROGRESS  Patient to return to walking or recumbent bike program 1-2x/week in order to maintain fitness.  Baseline: not performing.  Goal status: IN PROGRESS  Patient to score at least 20/24 on DGI in order to decrease risk of falls.  Baseline: 15 Goal status: IN PROGRESS  Patient to complete at least 1545 feet during 6 minute walk test in order to meet age-matches norms.  Baseline: 974 feet 12/07/24  Goal status: IN PROGRESS 12/07/24   Patient to demonstrate gait speed of at least 2.9 ft/sec in order to improve access to community.  Baseline: 2.67 ft/sec 12/08/23  Goal status: IN PROGRESS 12/08/23    ASSESSMENT:  CLINICAL IMPRESSION: Session with focus on improving endurance to improve activity tolerance with focus on time-based activities for pacing and vitals monitored throughout with good tolerance and vitals maintained throughout.  Only requiring 1 2-minute therapuetic rest period related to LE fatigue and only experienced DOE with 2 minute step-up activity.  Pt would benefit from continued sessions with focus on increased intensity to progress. Static balance activities to improve standing tolerance and facilitation of righting reactions for safety with ADL exhiiting mild-moderate sway throughout OBJECTIVE IMPAIRMENTS: Abnormal gait, decreased activity tolerance, decreased balance, difficulty walking, decreased strength, and postural dysfunction.   ACTIVITY LIMITATIONS: carrying, lifting, bending, standing, squatting, sleeping, stairs, transfers, bathing, dressing, hygiene/grooming, and locomotion level  PARTICIPATION LIMITATIONS: meal prep, cleaning, laundry, shopping, community activity, yard work, and church  PERSONAL FACTORS: Age, Past/current experiences, Time since onset of injury/illness/exacerbation, and 3+ comorbidities: Pacemaker, Anemia, anxiety, bradycardia, clotting disorder, depression, HLD, HTN, osteoporosis, PVC, SDH 2018, R TKA are also affecting patient's  functional outcome.   REHAB POTENTIAL: Good  CLINICAL DECISION MAKING: Evolving/moderate complexity  EVALUATION COMPLEXITY: Moderate  PLAN:  PT FREQUENCY: 1-2x/week  PT DURATION: 8 weeks  PLANNED INTERVENTIONS: 97164- PT Re-evaluation, 97110-Therapeutic exercises, 97530- Therapeutic activity, 97112- Neuromuscular re-education, 97535- Self Care, [id number]- Manual therapy, [id number]- Gait training, [phone]- Canalith repositioning, [id number]- Aquatic Therapy, [phone] (1-2 muscles), [id number] (3+ muscles)- Dry Needling, Patient/Family education, Balance training, Stair training, Taping, Joint mobilization, Spinal mobilization, Vestibular training, Cryotherapy, and Moist heat  PLAN FOR NEXT SESSION: monitor BP, review/progress HEP; work on endurance or stamina   11:41 AM, 12/30/24 M. Kelly Halpin, PT, DPT Physical Therapist- [place] Office Number: [phone]      "

## 2024-12-31 ENCOUNTER — Ambulatory Visit: Admitting: Physical Therapy

## 2025-01-01 ENCOUNTER — Encounter: Payer: Self-pay | Admitting: Cardiovascular Disease

## 2025-01-01 ENCOUNTER — Ambulatory Visit: Attending: Cardiovascular Disease | Admitting: Cardiovascular Disease

## 2025-01-01 VITALS — BP 116/78 | HR 81 | Ht 63.0 in | Wt 145.0 lb

## 2025-01-01 DIAGNOSIS — I495 Sick sinus syndrome: Secondary | ICD-10-CM | POA: Diagnosis not present

## 2025-01-01 DIAGNOSIS — G4733 Obstructive sleep apnea (adult) (pediatric): Secondary | ICD-10-CM | POA: Diagnosis not present

## 2025-01-01 DIAGNOSIS — I2510 Atherosclerotic heart disease of native coronary artery without angina pectoris: Secondary | ICD-10-CM

## 2025-01-01 DIAGNOSIS — I712 Thoracic aortic aneurysm, without rupture, unspecified: Secondary | ICD-10-CM

## 2025-01-01 DIAGNOSIS — I493 Ventricular premature depolarization: Secondary | ICD-10-CM

## 2025-01-01 DIAGNOSIS — E785 Hyperlipidemia, unspecified: Secondary | ICD-10-CM

## 2025-01-01 DIAGNOSIS — I10 Essential (primary) hypertension: Secondary | ICD-10-CM

## 2025-01-01 NOTE — Patient Instructions (Signed)

## 2025-01-01 NOTE — Progress Notes (Signed)
 "  Chief Complaint  Patient presents with   Follow-up    PVC/PAC/sick sinus syndrome   History of Present Illness: 79 yo female with history of sleep apnea, HLD, HTN, GERD/hiatal hernia/esopagheal stricture, sick sinus syndrome s/p pacemaker, PACs, PVCs who is here today for cardiac follow up. She is known to have PACs and PVCs.  She had a spontaneous subdural hematoma in October 2018 that did not require surgical intervention. Normal echo in 2021. She was seen in our office in October 2024 and reported ongoing fatigue. Coronary CTA November 2024 with mild disease in the RCA and LAD that was not flow limiting, normal CT FFR. Echo 11/05/23 with LVEF=65-70%. Normal RV function. No valve disease. 4.1 cm thoracic aortic aneurysm  She is here today for follow up. The patient denies any chest pain, dyspnea, palpitations, lower extremity edema, orthopnea, PND, dizziness, near syncope or syncope.    Primary Care Physician: Cleatus Arlyss RAMAN, MD  Past Medical History:  Diagnosis Date   Adenomatous colon polyp 12/2006   Allergic rhinitis    Anemia    Anxiety    Appendicitis 04/2020   Arthritis    Bradycardia    s/p MDT PPM   Cataract    bilateral   Clotting disorder September 26, 2017   Depression    Esophageal stricture    Family history of adverse reaction to anesthesia    maternal uncle with MH, nephew had MH during surgery and was OK, pt not been tested   GERD (gastroesophageal reflux disease)    Heart murmur    Hemorrhoids    Hyperlipidemia    Hypertension    Inguinal hernia    Osteoporosis    Presence of permanent cardiac pacemaker 2006   SSS   PVC (premature ventricular contraction)    Scleritis of right eye    Skin cancer    Basal and squamous cell   Sleep apnea    a. intolerant of CPAP, does wear a mouthpiece   Subdural hematoma (HCC)    a. spontanous 09/2017 b. resolved without intervention   Thyroid  disease     Past Surgical History:  Procedure Laterality Date    ABDOMINAL HYSTERECTOMY     APPENDECTOMY     BREAST BIOPSY Left 01/15/2014   BREAST BIOPSY Right 06/25/2023   US  RT BREAST BX W LOC DEV 1ST LESION IMG BX SPEC US  GUIDE 06/25/2023 [facility]   BREAST SURGERY     Biopsy-benign   CESAREAN SECTION     COLONOSCOPY  2018   DILATION AND CURETTAGE OF UTERUS     ESOPHAGOGASTRODUODENOSCOPY     esophageal stretching   EYE SURGERY     HAND SURGERY  1992   HERNIA REPAIR     INGUINAL HERNIA REPAIR  02/12/1994   left inguinal hernia repair   JOINT REPLACEMENT  12/21/2014   KNEE ARTHROSCOPY  2011   LAPAROSCOPIC APPENDECTOMY N/A 11/29/2020   Procedure: APPENDECTOMY LAPAROSCOPIC;  Surgeon: Paola Dreama SAILOR, MD;  Location: [facility];  Service: General;  Laterality: N/A;   OOPHORECTOMY  1995   LSO and RSO   PACEMAKER GENERATOR CHANGE N/A 01/21/2015   Procedure: PACEMAKER GENERATOR CHANGE;  Surgeon: Lynwood Rakers, MD;  Location: [facility] CATH LAB;  Service: Cardiovascular;  Laterality: N/A;   PACEMAKER INSERTION  2006   Dr. Ellin   POLYPECTOMY     Tear duct surgery  2010   THUMB SURGERY     THYROIDECTOMY  1979   RIGHT SIDE  TOTAL KNEE ARTHROPLASTY Right 12/20/2014   Procedure: RIGHT TOTAL KNEE ARTHROPLASTY;  Surgeon: Marcey Raman, MD;  Location: [facility] OR;  Service: Orthopedics;  Laterality: Right;   UPPER GASTROINTESTINAL ENDOSCOPY     unsure    Current Outpatient Medications  Medication Sig Dispense Refill   acetaminophen  (TYLENOL ) 500 MG tablet Take 1,000 mg by mouth every 6 (six) hours as needed (pain).     diltiazem  (CARDIZEM  CD) 180 MG 24 hr capsule Take 1 capsule (180 mg total) by mouth daily. 90 capsule 2   diltiazem  (CARDIZEM ) 30 MG tablet Take 1 tablet (30 mg total) by mouth daily as needed. 30 tablet 3   famotidine  (PEPCID ) 20 MG tablet Take 1 tablet by mouth twice daily 180 tablet 0   LORazepam  (ATIVAN ) 1 MG tablet TAKE 1 TABLET BY MOUTH EVERY 6 HOURS AS NEEDED 120 tablet 2   Polyethyl Glycol-Propyl Glycol (SYSTANE OP)  Place 1 drop into both eyes daily as needed (dry eyes).      rosuvastatin  (CRESTOR ) 10 MG tablet Take 0.5 tablets (5 mg total) by mouth daily.     No current facility-administered medications for this visit.    Allergies  Allergen Reactions   Cymbalta  [Duloxetine  Hcl]     Hyponatremia   Hydrocodone     Increased HR   Antihistamines, Loratadine-Type Other (See Comments)    Heart races/jittery   Cephalexin Nausea And Vomiting   Citalopram     intolerant   Codeine Other (See Comments)    Increased heart rate, anxiety   Demerol [Meperidine] Other (See Comments)    Increased heart rate, anxiety   Epinephrine  Other (See Comments)    Increased heart rate   Erythromycin Nausea And Vomiting   Hydrochlorothiazide      Low sodium   Hyoscyamine  Sulfate Other (See Comments)    Pt could not tolerate this med UNKNOWN reaction   Inderal [Propranolol] Other (See Comments)    Extreme tiredness   Lidocaine  Other (See Comments)    Family hx of malignant hyperthermia   Lipitor [Atorvastatin] Other (See Comments)    Muscle aches   Losartan Potassium Other (See Comments)    Pt could not tolerate this med UNKNOWN REACTION   Morphine Other (See Comments)    Nightmares, increased BP   Nebivolol Other (See Comments)    angioedema   Paxil [Paroxetine Hcl] Other (See Comments)    Intolerant, mood changes.    Penicillins Nausea And Vomiting   Prednisone Other (See Comments)    Tolerates low doses if absolutely necessary - makes her nervous, jittery   Propranolol Hcl Other (See Comments)    Pt could not tolerate this med UNKNOWN REACTION   Sertraline Hcl Other (See Comments)    Pt could not tolerate this med UNKNOWN REACTION   Tetracycline Nausea And Vomiting   Toprol  Xl [Metoprolol  Succinate] Other (See Comments)    Extreme tiredness   Verapamil Other (See Comments)    Pt could not tolerate this med UNKNOWN REACTION   Hydralazine      headaches   Oxycodone -Acetaminophen  Palpitations     Causes accelerated heart rate    Social History   Socioeconomic History   Marital status: Married    Spouse name: Riva   Number of children: 1   Years of education: Not on file   Highest education level: 12th grade  Occupational History   Occupation: Retired    Associate Professor: UNEMPLOYED  Tobacco Use   Smoking status: Never   Smokeless tobacco: Never  Vaping Use   Vaping status: Never Used  Substance and Sexual Activity   Alcohol  use: No    Alcohol /week: 0.0 standard drinks of alcohol    Drug use: No   Sexual activity: Not Currently    Birth control/protection: Surgical, Post-menopausal    Comment: HYST-1st intercourse 79 yo-fewer than 5 partners  Other Topics Concern   Not on file  Social History Narrative   Education:  12th grade   Married 1971   1 son in [place] , 4 grandkids   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   Social Drivers of Health   Tobacco Use: Low Risk (01/01/2025)   Patient History    Smoking Tobacco Use: Never    Smokeless Tobacco Use: Never    Passive Exposure: Not on file  Financial Resource Strain: Low Risk (10/06/2024)   Overall Financial Resource Strain (CARDIA)    Difficulty of Paying Living Expenses: Not hard at all  Food Insecurity: No Food Insecurity (10/06/2024)   Epic    Worried About Programme Researcher, Broadcasting/film/video in the Last Year: Never true    Ran Out of Food in the Last Year: Never true  Transportation Needs: No Transportation Needs (10/06/2024)   Epic    Lack of Transportation (Medical): No    Lack of Transportation (Non-Medical): No  Physical Activity: Inactive (10/06/2024)   Exercise Vital Sign    Days of Exercise per Week: 0 days    Minutes of Exercise per Session: Not on file  Stress: Stress Concern Present (10/06/2024)   [employer] of Occupational Health - Occupational Stress Questionnaire    Feeling of Stress: Very much  Social Connections: Socially Integrated (10/06/2024)   Social Connection and Isolation Panel    Frequency of  Communication with Friends and Family: More than three times a week    Frequency of Social Gatherings with Friends and Family: Patient declined    Attends Religious Services: More than 4 times per year    Active Member of Clubs or Organizations: Yes    Attends Banker Meetings: More than 4 times per year    Marital Status: Married  Catering Manager Violence: Not At Risk (09/10/2024)   Epic    Fear of Current or Ex-Partner: No    Emotionally Abused: No    Physically Abused: No    Sexually Abused: No  Depression (PHQ2-9): High Risk (11/19/2024)   Depression (PHQ2-9)    PHQ-2 Score: 11  Alcohol  Screen: Low Risk (09/10/2024)   Alcohol  Screen    Last Alcohol  Screening Score (AUDIT): 0  Housing: Low Risk (10/06/2024)   Epic    Unable to Pay for Housing in the Last Year: No    Number of Times Moved in the Last Year: 0    Homeless in the Last Year: No  Utilities: Not At Risk (09/10/2024)   Epic    Threatened with loss of utilities: No  Health Literacy: Adequate Health Literacy (09/10/2024)   B1300 Health Literacy    Frequency of need for help with medical instructions: Never    Family History  Problem Relation Age of Onset   Heart failure Sister    Heart disease Sister    Hypertension Sister    Colon polyps Sister    Arthritis Sister    Depression Sister    Dementia Mother    Hypertension Mother    Colon polyps Mother    Anxiety disorder Mother    Hypertension Father    Arthritis Father    Hyperlipidemia  Sister    Breast cancer Maternal Aunt        Age 47's   Diabetes Maternal Aunt    Diabetes Maternal Uncle    Ovarian cancer Maternal Aunt    Colon cancer Neg Hx    Esophageal cancer Neg Hx    Stomach cancer Neg Hx    Rectal cancer Neg Hx     Review of Systems:  As stated in the HPI and otherwise negative.   BP 116/78 (BP Location: Right Arm, Patient Position: Sitting, Cuff Size: Normal)   Pulse 81   Ht 5' 3 (1.6 m)   Wt 145 lb (65.8 kg)   SpO2 98%    BMI 25.69 kg/m   Physical Examination: General: Well developed, well nourished, NAD  SKIN: warm, dry. Neuro: No focal deficits  Psychiatric: Mood and affect normal  Neck: No JVD Lungs:Clear bilaterally, no wheezes, rhonci, crackles Cardiovascular: Regular rate and rhythm. No murmurs, gallops or rubs. Abdomen:Soft.  Extremities: No lower extremity edema.    EKG:  EKG is not ordered today. The ekg ordered today demonstrates   Recent Labs: 07/27/2024: TSH 2.384 07/29/2024: Hemoglobin 14.0; Magnesium 2.0; Platelets 197 08/07/2024: ALT 19 09/07/2024: BUN 18; Creatinine, Ser 0.88; Potassium 4.7; Sodium 133   Lipid Panel    Component Value Date/Time   CHOL 157 12/05/2023 1011   TRIG 87 12/05/2023 1011   HDL 65 12/05/2023 1011   CHOLHDL 2.4 12/05/2023 1011   CHOLHDL 3 09/13/2023 1025   VLDL 21.0 09/13/2023 1025   LDLCALC 76 12/05/2023 1011   LDLDIRECT 146.6 01/25/2014 0953     Wt Readings from Last 3 Encounters:  01/01/25 145 lb (65.8 kg)  11/19/24 144 lb 2 oz (65.4 kg)  11/11/24 145 lb 3.2 oz (65.9 kg)    Assessment and Plan:   1. HTN: BP is controlled. Hydrochlorothiazide  discontinued secondary to hyponatremia.  -Continue Cardizem   2. HLD: Followed in primary care. She has not tolerated Lipitor in the past. She is tolerating Crestor  5 mg daily. LDL near goal in January 2025.  -Continue Crestor  5 mg daily  3. Symptomatic bradycardia: Her pacemaker is followed in the pacemaker clinic.    4. Palpitations/PVCs/PACs: No palpitations. -Continue Cardizem   5. Thoracic aortic aneurysm: 4.1 cm by CT in November 2025.  Repeat chest CTA in November 2026.   6. CAD without angina: mild CAD by coronary CTA in November 2024. NO chest pain.  -Continue ASA and Crestor .    7. Sleep apnea: She has an oral appliance but does not use. I have asked her to follow up for this as she has ongoing fatigue and daytime somnolence.  Labs/ tests ordered today include:  No orders of the defined  types were placed in this encounter.  Disposition:   F/U with me in 12  months  Signed, Lonni Cash, MD 01/01/2025 4:31 PM    [facility] [address] [place], [place], [place]  [address] Phone: [phone]; Fax: [phone]  "

## 2025-01-05 ENCOUNTER — Encounter: Payer: Self-pay | Admitting: Family Medicine

## 2025-01-05 ENCOUNTER — Ambulatory Visit: Admitting: Family Medicine

## 2025-01-05 ENCOUNTER — Ambulatory Visit

## 2025-01-05 VITALS — BP 138/84 | HR 76 | Temp 98.4°F | Ht 63.0 in | Wt 143.2 lb

## 2025-01-05 DIAGNOSIS — E871 Hypo-osmolality and hyponatremia: Secondary | ICD-10-CM | POA: Diagnosis not present

## 2025-01-05 DIAGNOSIS — R5383 Other fatigue: Secondary | ICD-10-CM | POA: Diagnosis not present

## 2025-01-05 DIAGNOSIS — F419 Anxiety disorder, unspecified: Secondary | ICD-10-CM

## 2025-01-05 LAB — BASIC METABOLIC PANEL WITH GFR
BUN: 14 mg/dL (ref 6–23)
CO2: 29 meq/L (ref 19–32)
Calcium: 9.3 mg/dL (ref 8.4–10.5)
Chloride: 96 meq/L (ref 96–112)
Creatinine, Ser: 0.96 mg/dL (ref 0.40–1.20)
GFR: 57.23 mL/min — ABNORMAL LOW
Glucose, Bld: 80 mg/dL (ref 70–99)
Potassium: 4.4 meq/L (ref 3.5–5.1)
Sodium: 132 meq/L — ABNORMAL LOW (ref 135–145)
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
## 2025-01-06 ENCOUNTER — Telehealth: Payer: Self-pay | Admitting: Family Medicine

## 2025-01-06 MED ORDER — BUSPIRONE HCL 5 MG PO TABS: 5.0000 mg | ORAL_TABLET | Freq: Two times a day (BID) | ORAL | Status: DC

## 2025-01-06 NOTE — Assessment & Plan Note (Signed)
 Recheck sodium level pending.  Discussed potentially trying to start medication for depression/anxiety that is not likely to induce hyponatremia.  She has a history of multiple med intolerances.  I wanted check on options in the meantime.  See result note.  She is trying to continue with PT to improve her overall conditioning.  Continue as needed lorazepam  in the meantime.

## 2025-01-06 NOTE — Assessment & Plan Note (Signed)
 See notes on labs.

## 2025-01-06 NOTE — Telephone Encounter (Signed)
 I need your input about medication options that are not likely to induce hyponatremia.  This patient has longstanding fatigue and anxiety.  She has a history of mild hyponatremia at baseline.  She has multiple med intolerances.  She is taking lorazepam  as needed in the meantime.  I am trying to consider options for anxiety that are not likely to induce hyponatremia.  BuSpar  was the next option I was considering.  If you have other ideas then please let me know.  I always appreciate your help.

## 2025-01-07 ENCOUNTER — Ambulatory Visit

## 2025-01-08 ENCOUNTER — Ambulatory Visit: Payer: Self-pay | Admitting: Family Medicine

## 2025-01-08 MED ORDER — BUSPIRONE HCL 5 MG PO TABS: ORAL_TABLET | ORAL | 1 refills | Status: AC

## 2025-01-08 NOTE — Telephone Encounter (Signed)
 Please update patient.   Mildly low sodium at baseline.  Labs stable o/w.  Would try buspar  5mg  BID for 1 week, then 10mg  BID after that.    Please let me know how that goes.  Thanks.  Rx sent.

## 2025-01-08 NOTE — Telephone Encounter (Signed)
 Called patient reviewed with patient and husband. They will call if any issues or any side effects.

## 2025-01-12 ENCOUNTER — Ambulatory Visit

## 2025-01-14 ENCOUNTER — Ambulatory Visit: Admitting: Physical Therapy

## 2025-01-19 ENCOUNTER — Ambulatory Visit

## 2025-01-21 ENCOUNTER — Ambulatory Visit: Admitting: Physical Therapy

## 2025-02-04 ENCOUNTER — Encounter

## 2025-02-09 ENCOUNTER — Ambulatory Visit (HOSPITAL_BASED_OUTPATIENT_CLINIC_OR_DEPARTMENT_OTHER): Admitting: Pulmonary Disease

## 2025-03-17 ENCOUNTER — Ambulatory Visit: Admitting: Cardiovascular Disease

## 2025-05-06 ENCOUNTER — Encounter

## 2025-08-05 ENCOUNTER — Encounter
